# Patient Record
Sex: Female | Born: 1965 | Race: Black or African American | Hispanic: No | Marital: Single | Smoking: Current every day smoker
Health system: Southern US, Community
[De-identification: ages and names within clinical notes are randomized; demographics above are authoritative.]

## PROBLEM LIST (undated history)

## (undated) DIAGNOSIS — Z9289 Personal history of other medical treatment: Secondary | ICD-10-CM

## (undated) DIAGNOSIS — J189 Pneumonia, unspecified organism: Secondary | ICD-10-CM

## (undated) DIAGNOSIS — R002 Palpitations: Secondary | ICD-10-CM

## (undated) DIAGNOSIS — E049 Nontoxic goiter, unspecified: Secondary | ICD-10-CM

## (undated) DIAGNOSIS — Z720 Tobacco use: Secondary | ICD-10-CM

## (undated) DIAGNOSIS — S8990XA Unspecified injury of unspecified lower leg, initial encounter: Secondary | ICD-10-CM

## (undated) DIAGNOSIS — I10 Essential (primary) hypertension: Secondary | ICD-10-CM

## (undated) DIAGNOSIS — D649 Anemia, unspecified: Secondary | ICD-10-CM

## (undated) DIAGNOSIS — Z860100 Personal history of colon polyps, unspecified: Secondary | ICD-10-CM

## (undated) DIAGNOSIS — D219 Benign neoplasm of connective and other soft tissue, unspecified: Secondary | ICD-10-CM

## (undated) HISTORY — DX: Benign neoplasm of connective and other soft tissue, unspecified: D21.9

## (undated) HISTORY — PX: OTHER SURGICAL HISTORY: SHX169

## (undated) HISTORY — PX: JOINT REPLACEMENT: SHX530

## (undated) HISTORY — DX: Nontoxic goiter, unspecified: E04.9

## (undated) HISTORY — PX: COLONOSCOPY: SHX174

## (undated) HISTORY — DX: Morbid (severe) obesity due to excess calories: E66.01

## (undated) HISTORY — DX: Tobacco use: Z72.0

## (undated) HISTORY — PX: TUBAL LIGATION: SHX77

## (undated) HISTORY — DX: Anemia, unspecified: D64.9

## (undated) HISTORY — PX: HAND SURGERY: SHX662

---

## 1998-07-08 ENCOUNTER — Emergency Department (HOSPITAL_COMMUNITY): Admission: EM | Admit: 1998-07-08 | Discharge: 1998-07-08 | Payer: Self-pay | Admitting: Endocrinology

## 1999-12-03 ENCOUNTER — Emergency Department (HOSPITAL_COMMUNITY): Admission: EM | Admit: 1999-12-03 | Discharge: 1999-12-03 | Payer: Self-pay | Admitting: Emergency Medicine

## 1999-12-03 ENCOUNTER — Encounter: Payer: Self-pay | Admitting: Emergency Medicine

## 2000-04-20 ENCOUNTER — Other Ambulatory Visit: Admission: RE | Admit: 2000-04-20 | Discharge: 2000-04-20 | Payer: Self-pay | Admitting: Obstetrics

## 2001-08-26 ENCOUNTER — Encounter: Payer: Self-pay | Admitting: Obstetrics

## 2001-08-26 ENCOUNTER — Ambulatory Visit (HOSPITAL_COMMUNITY): Admission: RE | Admit: 2001-08-26 | Discharge: 2001-08-26 | Payer: Self-pay | Admitting: Obstetrics

## 2001-12-02 ENCOUNTER — Inpatient Hospital Stay (HOSPITAL_COMMUNITY): Admission: AD | Admit: 2001-12-02 | Discharge: 2001-12-02 | Payer: Self-pay | Admitting: Obstetrics

## 2002-04-22 ENCOUNTER — Emergency Department (HOSPITAL_COMMUNITY): Admission: EM | Admit: 2002-04-22 | Discharge: 2002-04-22 | Payer: Self-pay | Admitting: Emergency Medicine

## 2003-08-20 ENCOUNTER — Encounter: Payer: Self-pay | Admitting: Emergency Medicine

## 2003-08-20 ENCOUNTER — Emergency Department (HOSPITAL_COMMUNITY): Admission: EM | Admit: 2003-08-20 | Discharge: 2003-08-20 | Payer: Self-pay | Admitting: Emergency Medicine

## 2003-11-25 ENCOUNTER — Emergency Department (HOSPITAL_COMMUNITY): Admission: EM | Admit: 2003-11-25 | Discharge: 2003-11-26 | Payer: Self-pay

## 2003-12-08 ENCOUNTER — Ambulatory Visit (HOSPITAL_COMMUNITY): Admission: RE | Admit: 2003-12-08 | Discharge: 2003-12-08 | Payer: Self-pay | Admitting: Obstetrics

## 2004-11-21 ENCOUNTER — Emergency Department (HOSPITAL_COMMUNITY): Admission: EM | Admit: 2004-11-21 | Discharge: 2004-11-21 | Payer: Self-pay | Admitting: *Deleted

## 2005-02-14 ENCOUNTER — Encounter: Admission: RE | Admit: 2005-02-14 | Discharge: 2005-02-14 | Payer: Self-pay | Admitting: Obstetrics

## 2005-06-12 ENCOUNTER — Emergency Department (HOSPITAL_COMMUNITY): Admission: EM | Admit: 2005-06-12 | Discharge: 2005-06-12 | Payer: Self-pay | Admitting: Emergency Medicine

## 2005-06-23 ENCOUNTER — Emergency Department (HOSPITAL_COMMUNITY): Admission: EM | Admit: 2005-06-23 | Discharge: 2005-06-23 | Payer: Self-pay | Admitting: Emergency Medicine

## 2005-08-20 ENCOUNTER — Emergency Department (HOSPITAL_COMMUNITY): Admission: EM | Admit: 2005-08-20 | Discharge: 2005-08-20 | Payer: Self-pay | Admitting: Emergency Medicine

## 2006-01-20 ENCOUNTER — Emergency Department (HOSPITAL_COMMUNITY): Admission: EM | Admit: 2006-01-20 | Discharge: 2006-01-20 | Payer: Self-pay | Admitting: Emergency Medicine

## 2007-07-03 ENCOUNTER — Emergency Department (HOSPITAL_COMMUNITY): Admission: EM | Admit: 2007-07-03 | Discharge: 2007-07-03 | Payer: Self-pay | Admitting: Emergency Medicine

## 2007-09-08 ENCOUNTER — Inpatient Hospital Stay (HOSPITAL_COMMUNITY): Admission: AD | Admit: 2007-09-08 | Discharge: 2007-09-08 | Payer: Self-pay | Admitting: Gynecology

## 2007-09-09 ENCOUNTER — Encounter (INDEPENDENT_AMBULATORY_CARE_PROVIDER_SITE_OTHER): Payer: Self-pay | Admitting: Internal Medicine

## 2007-09-09 ENCOUNTER — Encounter: Admission: RE | Admit: 2007-09-09 | Discharge: 2007-09-09 | Payer: Self-pay | Admitting: Gynecology

## 2007-11-15 ENCOUNTER — Emergency Department (HOSPITAL_COMMUNITY): Admission: EM | Admit: 2007-11-15 | Discharge: 2007-11-15 | Payer: Self-pay | Admitting: Emergency Medicine

## 2007-12-23 HISTORY — PX: ANKLE SURGERY: SHX546

## 2008-02-27 ENCOUNTER — Inpatient Hospital Stay (HOSPITAL_COMMUNITY): Admission: EM | Admit: 2008-02-27 | Discharge: 2008-03-01 | Payer: Self-pay | Admitting: Emergency Medicine

## 2008-03-08 ENCOUNTER — Emergency Department (HOSPITAL_COMMUNITY): Admission: EM | Admit: 2008-03-08 | Discharge: 2008-03-08 | Payer: Self-pay | Admitting: Emergency Medicine

## 2009-02-13 ENCOUNTER — Inpatient Hospital Stay (HOSPITAL_COMMUNITY): Admission: AD | Admit: 2009-02-13 | Discharge: 2009-02-13 | Payer: Self-pay | Admitting: Obstetrics & Gynecology

## 2009-08-29 ENCOUNTER — Emergency Department (HOSPITAL_COMMUNITY): Admission: EM | Admit: 2009-08-29 | Discharge: 2009-08-29 | Payer: Self-pay | Admitting: Emergency Medicine

## 2009-08-31 ENCOUNTER — Emergency Department (HOSPITAL_COMMUNITY): Admission: EM | Admit: 2009-08-31 | Discharge: 2009-08-31 | Payer: Self-pay | Admitting: Emergency Medicine

## 2009-09-17 ENCOUNTER — Ambulatory Visit (HOSPITAL_COMMUNITY): Admission: RE | Admit: 2009-09-17 | Discharge: 2009-09-17 | Payer: Self-pay | Admitting: Internal Medicine

## 2009-09-17 ENCOUNTER — Encounter (INDEPENDENT_AMBULATORY_CARE_PROVIDER_SITE_OTHER): Payer: Self-pay | Admitting: Internal Medicine

## 2009-09-17 ENCOUNTER — Ambulatory Visit: Payer: Self-pay | Admitting: Infectious Diseases

## 2009-09-17 DIAGNOSIS — I10 Essential (primary) hypertension: Secondary | ICD-10-CM

## 2009-09-17 DIAGNOSIS — Z862 Personal history of diseases of the blood and blood-forming organs and certain disorders involving the immune mechanism: Secondary | ICD-10-CM | POA: Insufficient documentation

## 2009-09-17 DIAGNOSIS — E0520 Thyrotoxicosis with toxic multinodular goiter without thyrotoxic crisis or storm: Secondary | ICD-10-CM

## 2009-09-17 DIAGNOSIS — M79609 Pain in unspecified limb: Secondary | ICD-10-CM | POA: Insufficient documentation

## 2009-09-20 ENCOUNTER — Encounter (INDEPENDENT_AMBULATORY_CARE_PROVIDER_SITE_OTHER): Payer: Self-pay | Admitting: Internal Medicine

## 2009-09-20 ENCOUNTER — Ambulatory Visit: Payer: Self-pay | Admitting: Internal Medicine

## 2009-09-20 LAB — CONVERTED CEMR LAB
Albumin: 4 g/dL (ref 3.5–5.2)
Alkaline Phosphatase: 72 units/L (ref 39–117)
BUN: 10 mg/dL (ref 6–23)
CO2: 21 meq/L (ref 19–32)
Calcium: 9.2 mg/dL (ref 8.4–10.5)
Cholesterol: 143 mg/dL (ref 0–200)
Free T4: 1.55 ng/dL (ref 0.80–1.80)
Glucose, Bld: 78 mg/dL (ref 70–99)
HDL: 64 mg/dL (ref >39)
Hemoglobin: 10 g/dL — ABNORMAL LOW (ref 12.0–15.0)
MCHC: 30.7 g/dL (ref 30.0–36.0)
Potassium: 4.6 meq/L (ref 3.5–5.3)
RBC: 4.33 M/uL (ref 3.87–5.11)
Total Protein: 6.8 g/dL (ref 6.0–8.3)
Triglycerides: 44 mg/dL (ref <150)
WBC: 5.7 10*3/uL (ref 4.0–10.5)

## 2009-09-26 ENCOUNTER — Encounter (HOSPITAL_COMMUNITY): Admission: RE | Admit: 2009-09-26 | Discharge: 2009-11-23 | Payer: Self-pay | Admitting: Internal Medicine

## 2009-10-01 ENCOUNTER — Telehealth (INDEPENDENT_AMBULATORY_CARE_PROVIDER_SITE_OTHER): Payer: Self-pay | Admitting: Internal Medicine

## 2009-10-02 ENCOUNTER — Encounter (INDEPENDENT_AMBULATORY_CARE_PROVIDER_SITE_OTHER): Payer: Self-pay | Admitting: Internal Medicine

## 2009-10-02 ENCOUNTER — Ambulatory Visit: Payer: Self-pay | Admitting: Internal Medicine

## 2009-10-02 ENCOUNTER — Ambulatory Visit (HOSPITAL_COMMUNITY): Admission: RE | Admit: 2009-10-02 | Discharge: 2009-10-02 | Payer: Self-pay | Admitting: Internal Medicine

## 2009-10-02 LAB — CONVERTED CEMR LAB: TSH: 0.007 microintl units/mL — ABNORMAL LOW (ref 0.350–4.5)

## 2009-10-05 ENCOUNTER — Ambulatory Visit: Payer: Self-pay | Admitting: Internal Medicine

## 2009-10-09 ENCOUNTER — Encounter (INDEPENDENT_AMBULATORY_CARE_PROVIDER_SITE_OTHER): Payer: Self-pay | Admitting: Internal Medicine

## 2009-10-10 ENCOUNTER — Encounter (INDEPENDENT_AMBULATORY_CARE_PROVIDER_SITE_OTHER): Payer: Self-pay | Admitting: Internal Medicine

## 2009-10-17 ENCOUNTER — Ambulatory Visit: Payer: Self-pay | Admitting: Internal Medicine

## 2009-10-18 ENCOUNTER — Ambulatory Visit: Payer: Self-pay | Admitting: Internal Medicine

## 2009-10-19 LAB — CONVERTED CEMR LAB
Cholesterol: 113 mg/dL (ref 0–200)
Ferritin: 14 ng/mL (ref 10–291)
HDL: 54 mg/dL (ref >39)
Saturation Ratios: 44 % (ref 20–55)
Total CHOL/HDL Ratio: 2.1
Triglycerides: 36 mg/dL (ref <150)
Vitamin B-12: 200 pg/mL — ABNORMAL LOW (ref 211–911)

## 2009-10-22 ENCOUNTER — Encounter (INDEPENDENT_AMBULATORY_CARE_PROVIDER_SITE_OTHER): Payer: Self-pay | Admitting: Internal Medicine

## 2009-10-29 ENCOUNTER — Ambulatory Visit: Payer: Self-pay | Admitting: Internal Medicine

## 2009-10-29 DIAGNOSIS — D518 Other vitamin B12 deficiency anemias: Secondary | ICD-10-CM

## 2009-11-01 ENCOUNTER — Ambulatory Visit: Payer: Self-pay | Admitting: Internal Medicine

## 2009-11-12 ENCOUNTER — Encounter (INDEPENDENT_AMBULATORY_CARE_PROVIDER_SITE_OTHER): Payer: Self-pay | Admitting: Internal Medicine

## 2009-12-28 ENCOUNTER — Telehealth: Payer: Self-pay | Admitting: Internal Medicine

## 2010-01-30 ENCOUNTER — Encounter: Payer: Self-pay | Admitting: Internal Medicine

## 2010-01-30 ENCOUNTER — Ambulatory Visit: Payer: Self-pay | Admitting: Internal Medicine

## 2010-01-30 DIAGNOSIS — J029 Acute pharyngitis, unspecified: Secondary | ICD-10-CM | POA: Insufficient documentation

## 2010-02-01 LAB — CONVERTED CEMR LAB
Basophils Relative: 1 % (ref 0–1)
Eosinophils Absolute: 0.3 10*3/uL (ref 0.0–0.7)
Free T4: 0.96 ng/dL (ref 0.80–1.80)
Lymphs Abs: 1.9 10*3/uL (ref 0.7–4.0)
MCV: 84.4 fL (ref >=78.0)
Monocytes Relative: 7 % (ref 3–12)
Neutro Abs: 2.7 10*3/uL (ref 1.7–7.7)
Neutrophils Relative %: 51 % (ref 43–77)
Platelets: 188 10*3/uL (ref 150–400)
RBC: 3.93 M/uL (ref 3.87–5.11)
Streptococcus, Group A Screen (Direct): NEGATIVE
WBC: 5.3 10*3/uL (ref 4.0–10.5)

## 2010-03-05 ENCOUNTER — Telehealth: Payer: Self-pay

## 2010-03-19 ENCOUNTER — Ambulatory Visit: Payer: Self-pay | Admitting: Infectious Diseases

## 2010-03-19 ENCOUNTER — Ambulatory Visit (HOSPITAL_COMMUNITY): Admission: RE | Admit: 2010-03-19 | Discharge: 2010-03-19 | Payer: Self-pay | Admitting: Infectious Diseases

## 2010-03-19 ENCOUNTER — Encounter (INDEPENDENT_AMBULATORY_CARE_PROVIDER_SITE_OTHER): Payer: Self-pay | Admitting: Internal Medicine

## 2010-03-20 LAB — CONVERTED CEMR LAB
Alkaline Phosphatase: 85 units/L (ref 39–117)
CO2: 22 meq/L (ref 19–32)
Creatinine, Ser: 0.95 mg/dL (ref 0.40–1.20)
Free T4: 0.53 ng/dL — ABNORMAL LOW (ref 0.80–1.80)
Glucose, Bld: 88 mg/dL (ref 70–99)
Sodium: 138 meq/L (ref 135–145)
TSH: 2.577 microintl units/mL (ref 0.350–4.5)
Total Bilirubin: 0.6 mg/dL (ref 0.3–1.2)
Total Protein: 7.6 g/dL (ref 6.0–8.3)

## 2010-05-08 ENCOUNTER — Ambulatory Visit: Payer: Self-pay | Admitting: Infectious Disease

## 2010-05-08 ENCOUNTER — Emergency Department (HOSPITAL_COMMUNITY): Admission: EM | Admit: 2010-05-08 | Discharge: 2010-05-08 | Payer: Self-pay | Admitting: Emergency Medicine

## 2010-05-08 ENCOUNTER — Telehealth: Payer: Self-pay | Admitting: *Deleted

## 2010-06-13 ENCOUNTER — Encounter: Payer: Self-pay | Admitting: Internal Medicine

## 2010-06-19 ENCOUNTER — Emergency Department (HOSPITAL_COMMUNITY): Admission: EM | Admit: 2010-06-19 | Discharge: 2010-06-19 | Payer: Self-pay | Admitting: Emergency Medicine

## 2010-06-25 ENCOUNTER — Encounter: Admission: RE | Admit: 2010-06-25 | Discharge: 2010-06-25 | Payer: Self-pay | Admitting: Otolaryngology

## 2010-06-25 ENCOUNTER — Other Ambulatory Visit: Admission: RE | Admit: 2010-06-25 | Discharge: 2010-06-25 | Payer: Self-pay | Admitting: Interventional Radiology

## 2010-06-30 ENCOUNTER — Ambulatory Visit: Payer: Self-pay | Admitting: Nurse Practitioner

## 2010-06-30 ENCOUNTER — Inpatient Hospital Stay (HOSPITAL_COMMUNITY): Admission: AD | Admit: 2010-06-30 | Discharge: 2010-06-30 | Payer: Self-pay | Admitting: Obstetrics

## 2010-09-20 ENCOUNTER — Ambulatory Visit: Payer: Self-pay | Admitting: Internal Medicine

## 2010-09-20 LAB — CONVERTED CEMR LAB
Free T4: 1.02 ng/dL (ref 0.80–1.80)
Hemoglobin: 11.4 g/dL — ABNORMAL LOW (ref 12.0–15.0)
RBC: 4.02 M/uL (ref 3.87–5.11)
TSH: 0.791 microintl units/mL (ref 0.350–4.5)
Vitamin B-12: 296 pg/mL (ref 211–911)
WBC: 6.7 10*3/uL (ref 4.0–10.5)

## 2010-09-21 ENCOUNTER — Telehealth: Payer: Self-pay | Admitting: Internal Medicine

## 2010-11-01 ENCOUNTER — Encounter: Admission: RE | Admit: 2010-11-01 | Discharge: 2010-11-01 | Payer: Self-pay | Admitting: Obstetrics

## 2010-11-25 ENCOUNTER — Ambulatory Visit: Payer: Self-pay

## 2010-12-17 ENCOUNTER — Emergency Department (HOSPITAL_COMMUNITY)
Admission: EM | Admit: 2010-12-17 | Discharge: 2010-12-17 | Payer: Self-pay | Source: Home / Self Care | Admitting: Emergency Medicine

## 2010-12-22 HISTORY — PX: REPLACEMENT TOTAL KNEE: SUR1224

## 2011-01-03 ENCOUNTER — Emergency Department (HOSPITAL_COMMUNITY)
Admission: EM | Admit: 2011-01-03 | Discharge: 2011-01-04 | Payer: Self-pay | Source: Home / Self Care | Admitting: Emergency Medicine

## 2011-01-12 ENCOUNTER — Encounter: Payer: Self-pay | Admitting: Obstetrics

## 2011-01-13 ENCOUNTER — Ambulatory Visit: Admit: 2011-01-13 | Payer: Self-pay

## 2011-01-13 ENCOUNTER — Emergency Department (HOSPITAL_COMMUNITY)
Admission: EM | Admit: 2011-01-13 | Discharge: 2011-01-13 | Payer: Self-pay | Source: Home / Self Care | Admitting: Emergency Medicine

## 2011-01-21 NOTE — Assessment & Plan Note (Signed)
Summary: PAIN BACK/ (RIOFRIO)SB.   Allergies: No Known Drug Allergies   Complete Medication List: 1)  Atenolol 50 Mg Tabs (Atenolol) .... Take 1 pill by mouth daily. 2)  Tylenol With Codeine #3 300-30 Mg Tabs (Acetaminophen-codeine) .... Take 1 tablet every 6 hours as needed for pain. 3)  Hydrochlorothiazide 25 Mg Tabs (Hydrochlorothiazide) .... Take 1 tablet by mouth daily Patient presented to the front desk and was taken to the ED given her complaints. Please see the phone note from today.

## 2011-01-21 NOTE — Progress Notes (Signed)
  Phone Note Call from Patient   Reason for Call: Referral Summary of Call: Patient called this morning and cancelled her appointment for this afternoon with Dr. Tobie Lords.  Patient is having transportation issues.  Ms. Bossler stated that she has now found a closer Endo/Dr.  Here is the information she gave,  she said she called Dr. Laurena Slimmer office on [address] at [phone].  She said this office will accept her Insurance which is Medicaid. Initial call taken by: Shon Hough,  March 05, 2010 1:58 PM

## 2011-01-21 NOTE — Progress Notes (Signed)
Summary: chest pain/ hla  Phone Note Other Incoming   Summary of Call: pt presents crying, c/o L shoulder pain x 2 wks and since last pm radiating to L chest, and c/o difficulty breathing and shortness of breath, wh/ch is obtained, pt assisted to wh/ch, chart summary printed and taken to ED, report given to [facility]  triage nurse Initial call taken by: Marin Roberts RN,  May 09, 2010 6:32 PM

## 2011-01-21 NOTE — Progress Notes (Signed)
  Phone Note Call from Patient   Caller: Patient Reason for Call: Refill Medication Summary of Call: Pt seen in clinic yesterday; given scripts for benadryl and topical anti-itch cream for symptomatic relief of chicken pox.  Pt reports scripts not received by her [facility] pharmacy and she request refills.  Refills sent to [facility] on [address] per pt request. Initial call taken by: Nelda Bucks DO,  September 21, 2010 9:46 AM

## 2011-01-21 NOTE — Assessment & Plan Note (Signed)
Summary: throat pain/gg   Vital Signs:  Patient profile:   45 year old female Height:      67 inches (170.18 cm) Weight:      257.2 pounds (116.91 kg) BMI:     40.43 Temp:     98.4 degrees F oral Pulse rate:   65 / minute BP sitting:   147 / 79  (right arm) Cuff size:   large  Vitals Entered By: Chinita Pester RN (January 30, 2010 2:44 PM) CC: Difficulty swallowing w/pain x 2 weeks. Also burning sensation. Is Patient Diabetic? No Pain Assessment Patient in pain? yes     Location: throat Intensity: 8 Type: soreness Onset of pain  Intermittent Nutritional Status BMI of > 30 = obese  Have you ever been in a relationship where you felt threatened, hurt or afraid?No   Does patient need assistance? Functional Status Self care Ambulation Normal   CC:  Difficulty swallowing w/pain x 2 weeks. Also burning sensation.Marland Kitchen  History of Present Illness: [name] is a 45 yo F with PMH of toxic multinodular goiter and newly diagnosed B12 deficiency who presents for:  1)C/o sore throat with swollowing, talking and eating.  Onset 1 week ago.  Pt has been seen and treated at [facility] for goiter per endocrine referral.  She states she was given  a radiation "pill" to swallow X 1 .  This sore throat started 1 - 2 weeks after treatment.  Not associated with fevers. Has a burning sensation in back of throat. She has f/u at [facility] on 2/17 and has not been able to get in touch with them.   Hx of multinodular goiter - dx in 09/2009, patient followed by endocrine at [facility]. Pt elected for subtotal thyroidectomy, however surgery was not recommended at that time because her thyroid gland was significantly swollen. Pt receive RAI in 12/2009. No further f/u since treatment.   Preventive Screening-Counseling & Management  Alcohol-Tobacco     Alcohol type: beer     Smoking Status: current     Smoking Cessation Counseling: yes     Packs/Day: 2 cigs this week-end     Year Started: at age  74  Caffeine-Diet-Exercise     Caffeine use/day: 1 cup coffee/day     Does Patient Exercise: no  Current Medications (verified): 1)  Atenolol 50 Mg Tabs (Atenolol) .... Take 1 Pill By Mouth Daily.  Allergies (verified): No Known Drug Allergies  Past History:  Past Medical History: Hx of Multinodular goiter dx 11/2009, treated at [facility] (Dr. Esaw Grandchild and Dr. Normajean Glasgow)  Social History: Does Patient Exercise:  no  Review of Systems ENT:  Complains of hoarseness and sore throat; denies ear discharge, earache, nasal congestion, and sinus pressure. Resp:  Complains of cough; denies chest pain with inspiration, pleuritic, shortness of breath, and sputum productive. GI:  Denies abdominal pain, change in bowel habits, nausea, and vomiting.  Physical Exam  General:  alert and well-developed.   Head:  normocephalic and atraumatic.   Eyes:  vision grossly intact, pupils equal, pupils round, and pupils reactive to light.   Ears:  R ear normal and L ear normal.   Nose:  no nasal discharge, no mucosal pallor, and no mucosal edema.   Mouth:  good dentition, pharynx pink and moist, no erythema, no exudates, and no posterior lymphoid hypertrophy.   Neck:  supple, full ROM, no masses, and no thyromegaly.   Lungs:  normal respiratory effort, normal breath sounds,  no dullness, no fremitus, no crackles, and no wheezes.   Heart:  normal rate, regular rhythm, no murmur, no gallop, and no rub.   Abdomen:  soft, non-tender, normal bowel sounds, no distention, no masses, and no guarding.     Impression & Recommendations:  Problem # 1:  SORE THROAT (ICD-462) Assessment New Patient's sore throat is unliely infectious given no hx of fevers, chills, flu-like symptoms as well as description of burning, and burning sensation at back of throat. Rapid strep performed and was negative. After speaking with patient's endocrinologist at [facility] (Dr. Royston Sinner) it is very unlikely that RAI is the culprit  for sore throat. In speaking with her, as well as the attending physician at [facility], RAI induced thyroiditis is not likely, however possible. Their recommendations included checking TSH, and free T4, and will have patient follow up next week at endocrine clinic. Pt will have thyroid ultrasound at follow-up appointment. I will give a short course of tyelenol-codeine for pain management as pain has not been well managed with ibuprofen.   Orders: T-Culture, Rapid Strep ([id number])  Problem # 2:  GOITER, TOXIC, MULTINODULAR (ICD-242.20) Assessment: Comment Only Please refer to above discussion.   Her updated medication list for this problem includes:    Atenolol 50 Mg Tabs (Atenolol) .Marland Kitchen... Take 1 pill by mouth daily.  Orders: T-CBC w/Diff [phone]) T-T4, Free [phone]) T-TSH [phone])  Problem # 3:  ANEMIA, VITAMIN B12 DEFICIENCY (ICD-281.1) Assessment: Unchanged Pt reports discontinuing iron supplements due to headaches. Advised patient that given her anemia, it is important for her to continue with Iron supplements.  Plan: -check CBC today  The following medications were removed from the medication list:    Iron 325 (65 Fe) Mg Tabs (Ferrous sulfate) .Marland Kitchen... Take 1 pill by mouth once daily  Problem # 4:  HYPERTENSION (ICD-401.9) Assessment: Unchanged Relatively unchanged. Will start patient on 2nd bp agent as SBP may be elevated 2/2 to pain. Will recheck at next clinic visit and will consider anti-hypertensive if continues to be elevated.   Her updated medication list for this problem includes:    Atenolol 50 Mg Tabs (Atenolol) .Marland Kitchen... Take 1 pill by mouth daily.  BP today: 147/79 Prior BP: 145/87 (10/29/2009)  Labs Reviewed: K+: 4.6 (09/17/2009) Creat: : 0.60 (09/17/2009)   Chol: 113 (10/18/2009)   HDL: 54 (10/18/2009)   LDL: 52 (10/18/2009)   TG: 36 (10/18/2009)  Complete Medication List: 1)  Atenolol 50 Mg Tabs (Atenolol) .... Take 1 pill by mouth daily. 2)   Tylenol With Codeine #3 300-30 Mg Tabs (Acetaminophen-codeine) .... Take 1 tablet every 6 hours as needed for pain.  Patient Instructions: 1)  Please schedule a follow-up appointment in 2 weeks. 2)  Please follow up with endocrinologist as scheduled.  Prescriptions: TYLENOL WITH CODEINE #3 300-30 MG TABS (ACETAMINOPHEN-CODEINE) Take 1 tablet every 6 hours as needed for pain.  #30 x 0   Entered and Authorized by:   Melida Quitter MD   Signed by:   Melida Quitter MD on 01/30/2010   Method used:   Print then Give to Patient   RxID:   [id number]   Prevention & Chronic Care Immunizations   Influenza vaccine: Not documented   Influenza vaccine deferral: Refused  (10/17/2009)    Tetanus booster: Not documented    Pneumococcal vaccine: Not documented  Other Screening   Pap smear: Not documented    Mammogram: Not documented   Mammogram action/deferral: Ordered  (09/17/2009)   Smoking status: current  (  01/30/2010)   Smoking cessation counseling: yes  (01/30/2010)  Lipids   Total Cholesterol: 113  (10/18/2009)   LDL: 52  (10/18/2009)   LDL Direct: Not documented   HDL: 54  (10/18/2009)   Triglycerides: 36  (10/18/2009)  Hypertension   Last Blood Pressure: 147 / 79  (01/30/2010)   Serum creatinine: 0.60  (09/17/2009)   Serum potassium 4.6  (09/17/2009)    Hypertension flowsheet reviewed?: Yes   Progress toward BP goal: Unchanged  Self-Management Support :   Personal Goals (by the next clinic visit) :      Personal blood pressure goal: 140/90  (01/30/2010)   Patient will work on the following items until the next clinic visit to reach self-care goals:     Medications and monitoring: take my medicines every day, check my blood pressure  (01/30/2010)    Hypertension self-management support: BP self-monitoring log, Written self-care plan  (01/30/2010)   Hypertension self-care plan printed.  Process Orders Check Orders Results:     [facility]: ABN not  required for this insurance Tests Sent for requisitioning (January 30, 2010 9:02 PM):     01/30/2010: [facility] -- T-Culture, Rapid Strep [[id number]] (signed)     01/30/2010: [facility] -- T-CBC w/Diff [[id number]] (signed)     01/30/2010: [facility] -- [place], [place] [[id number]] (signed)     01/30/2010: [facility] -- T-TSH [phone] (signed)    Process Orders Check Orders Results:     [facility]: ABN not required for this insurance Tests Sent for requisitioning (January 30, 2010 9:02 PM):     01/30/2010: [facility] -- T-Culture, Rapid Strep [[id number]] (signed)     01/30/2010: [facility] -- T-CBC w/Diff [[id number]] (signed)     01/30/2010: [facility] -- [place], [place] [[id number]] (signed)     01/30/2010: [facility] -- T-TSH [phone] (signed)

## 2011-01-21 NOTE — Assessment & Plan Note (Signed)
Summary: acute-recheck bp/cfb(pokharel)   Vital Signs:  Patient profile:   45 year old female Height:      67 inches (170.18 cm) Weight:      266.8 pounds (121.27 kg) BMI:     41.94 Temp:     97.3 degrees F (36.28 degrees C) oral Pulse rate:   80 / minute BP sitting:   161 / 107  (left arm)  Vitals Entered By: Blenda Mounts (March 19, 2010 1:56 PM)/Gladys Herbin, RN March 19, 2010 1:56 AM CC: bp recheck, tight feeling on left side of check through arm and back , light headed, hand swelling Is Patient Diabetic? No Pain Assessment Patient in pain? no      Type: tingling Nutritional Status BMI of > 30 = obese  Does patient need assistance? Functional Status Self care Ambulation Normal Comments pt thinks medication may be causing swelling in hands, rechecked bp on RA sitting was 150/105, pt also need a referral to an endocrinologist in [place], did make the appt with endocrinologist in [place] but because of transportation issue need one in [place]   CC:  bp recheck, tight feeling on left side of check through arm and back , light headed, and hand swelling.  History of Present Illness: Ms. Domzalski is a 45 yo F with PMH of hyperthyroidism s/p RAI in 1/11 who presents for chest/arm tingling and pain. She says over the past few days, she has had episodes of chest/arm tingling that progress into a "squeezing" pain, like "a blood pressure cuff is squeezing my arm." During these episodes, which last approx 1 minute and predominantly affect her R arm, her arm feels so heavy and painful that she can't lift her arm up. They can occur at any time, even when she's just sitting and making conversation, and are associated with dizziness with room spinning and a lightheaded sensation. She felt like her blood pressure has been high and feels like this may be 2/2 her thyroid. Denies SOB or nausea during these episodes. She also reports occasional night sweats over the past few days as  well continued sore throat 2/2 thyroid (see clinic note 2/9) and hand/feet swelling that she feels is related to her atenolol (as she has c/o this since starting the atenolol several months ago).   Depression History:      The patient denies a depressed mood most of the day and a diminished interest in her usual daily activities.         Preventive Screening-Counseling & Management  Alcohol-Tobacco     Alcohol drinks/day: 0     Smoking Status: current     Smoking Cessation Counseling: yes     Packs/Day: 2 cigs this week-end     Year Started: at age 40  Caffeine-Diet-Exercise     Caffeine use/day: soda/ tea     Does Patient Exercise: no     Type of exercise: walking     Times/week: 5  Current Medications (verified): 1)  Atenolol 50 Mg Tabs (Atenolol) .... Take 1 Pill By Mouth Daily. 2)  Tylenol With Codeine #3 300-30 Mg Tabs (Acetaminophen-Codeine) .... Take 1 Tablet Every 6 Hours As Needed For Pain. 3)  Hydrochlorothiazide 25 Mg Tabs (Hydrochlorothiazide) .... Take 1 Tablet By Mouth Daily  Allergies (verified): No Known Drug Allergies  Past History:  Past Medical History: Last updated: 01/30/2010 Hx of Multinodular goiter dx 11/2009, treated at [facility] (Dr. Esaw Grandchild and Dr. Normajean Glasgow)  Past Surgical History: Last updated: 09/17/2009  Surgery for R broken ankle in 2009  Family History: Last updated: 09/17/2009 Father-HTN Mother- HTN Grandmother- lung ca  Social History: Last updated: 09/17/2009 lives at home with 82 yo son applying for disability, previously worked in restaurants tobacco- 1/2 PPD x 20 years alcohol- drinks once/month drugs- none  Risk Factors: Alcohol Use: 0 (03/19/2010) Caffeine Use: soda/ tea (03/19/2010) Exercise: no (03/19/2010)  Risk Factors: Smoking Status: current (03/19/2010) Packs/Day: 2 cigs this week-end (03/19/2010)  Social History: Caffeine use/day:  soda/ tea  Review of Systems      See HPI  Physical  Exam  General:  Well-developed,well-nourished,in no acute distress; alert,appropriate and cooperative throughout examination Head:  Normocephalic and atraumatic without obvious abnormalities. No apparent alopecia or balding. Neck:  mildly grossly enlarged thyroid, could not palpate specific nodules Lungs:  Normal respiratory effort, chest expands symmetrically. Lungs are clear to auscultation, no crackles or wheezes. Heart:  Normal rate and regular rhythm. S1 and S2 normal without gallop, murmur, click, rub or other extra sounds. Extremities:  trace edema in feet and hands bilaterally Neurologic:  alert & oriented X3.   Psych:  Cognition and judgment appear intact. Alert and cooperative with normal attention span and concentration. No apparent delusions, illusions, hallucinations   Impression & Recommendations:  Problem # 1:  ARM PAIN (ICD-729.5) EKG was ordered, which ruled out ischemia as cause of her arm/chest/dizziness complaints. Her symptoms are likely 2/2 to hyperthyroidism that has not been adequately treated and/or uncontrolled hypertension (which is likely 2/2 the former). TIA is also a remote possibility but far less likely considering the frequency of identical episodes. We will try to control her BP (see hypertension assessment) and remeasure TSH. We will also remeasure B12 as she had low B12 with normal MMA in 10/10; B12 shots were initially ordered but were held after the MMA returned normal (I had spoken with an attending about this matter at the time). Given her symptoms, we will remeasure B12 and give her a shot today, per Dr. Jarrett Ables recommendations. Depending on what her B12 level is today, she may need to start oral therapy. She will contact the clinic if her symptoms do not improve.  EKG showed normal sinus rhythm with LVH and possible atrial enlargement. No signs of ischemia.  Orders: 12 Lead EKG (12 Lead EKG)  Problem # 2:  HYPERTENSION (ICD-401.9) Worse today and  likely contributing to her current symptoms. Given her HTN and her swelling, I will start her on HCTZ. She will get a Bmet today and a repeat Bmet in 2 weeks as well as a repeat BP check.  Her updated medication list for this problem includes:    Atenolol 50 Mg Tabs (Atenolol) .Marland Kitchen... Take 1 pill by mouth daily.    Hydrochlorothiazide 25 Mg Tabs (Hydrochlorothiazide) .Marland Kitchen... Take 1 tablet by mouth daily  Problem # 3:  GOITER, TOXIC, MULTINODULAR (ICD-242.20) She had RAI in Jan '11 (she was supposed to have a partial thyroidectomy but her thyroid was too swollen at the time per clinic note by Dr. Baltazar Apo on 01/30/10) but is still hyperthyroid with suppressed TSH (but normal free T4) per labs done 01/30/10. I will recheck TSH and free T4 today given her worsening symptoms. She missed her endo appt on 3/15 due to transportation difficulties, so she is scheduled to see them [facility]) on 4/19. However, she has recently been referred to a local endocrinology clinic in [place] now that she has medicaid, so hopefully they can see her sooner and  decide on a treatment plan for her.  Her updated medication list for this problem includes:    Atenolol 50 Mg Tabs (Atenolol) .Marland Kitchen... Take 1 pill by mouth daily.  Orders: T-Comprehensive Metabolic Panel [phone]) T-TSH [phone]) T-T4, Free [phone])  Problem # 4:  ANEMIA, VITAMIN B12 DEFICIENCY (ICD-281.1) See assessment under #1. Will check B12 today and also give a B12 shot per Dr. Jarrett Ables recommendations. She may or may not need oral therapy depending on the results of the lab test.  Orders: Vit B12 1000 mcg (J3420) T-Vitamin B12 ([id number]) Admin of Therapeutic Inj  intramuscular or subcutaneous ([id number])  Complete Medication List: 1)  Atenolol 50 Mg Tabs (Atenolol) .... Take 1 pill by mouth daily. 2)  Tylenol With Codeine #3 300-30 Mg Tabs (Acetaminophen-codeine) .... Take 1 tablet every 6 hours as needed for pain. 3)   Hydrochlorothiazide 25 Mg Tabs (Hydrochlorothiazide) .... Take 1 tablet by mouth daily  Patient Instructions: 1)  Please schedule a follow-up appointment in 2 weeks. 2)  I have added a medication for you called hydrochlorothiazide. This should help with both your blood pressure and your swelling.  3)  The endocrinology clinic in [place] will call you with an appointment. 4)  If your chest pain and other symptoms do not improve, please let us know.  Prescriptions: TYLENOL WITH CODEINE #3 300-30 MG TABS (ACETAMINOPHEN-CODEINE) Take 1 tablet every 6 hours as needed for pain.  #60 x 0   Entered and Authorized by:   Silvestre Gunner MD   Signed by:   Silvestre Gunner MD on 03/19/2010   Method used:   Handwritten   RxID:   [id number] HYDROCHLOROTHIAZIDE 25 MG TABS (HYDROCHLOROTHIAZIDE) Take 1 tablet by mouth daily  #30 x 6   Entered and Authorized by:   Silvestre Gunner MD   Signed by:   Silvestre Gunner MD on 03/19/2010   Method used:   Print then Give to Patient   RxID:   [id number]   Prevention & Chronic Care Immunizations   Influenza vaccine: Not documented   Influenza vaccine deferral: Refused  (10/17/2009)    Tetanus booster: Not documented    Pneumococcal vaccine: Not documented  Other Screening   Pap smear: Not documented    Mammogram: Not documented   Mammogram action/deferral: Ordered  (09/17/2009)   Smoking status: current  (03/19/2010)   Smoking cessation counseling: yes  (03/19/2010)  Lipids   Total Cholesterol: 113  (10/18/2009)   LDL: 52  (10/18/2009)   LDL Direct: Not documented   HDL: 54  (10/18/2009)   Triglycerides: 36  (10/18/2009)  Hypertension   Last Blood Pressure: 161 / 107  (03/19/2010)   Serum creatinine: 0.60  (09/17/2009)   Serum potassium 4.6  (09/17/2009) CMP ordered   Self-Management Support :   Personal Goals (by the next clinic visit) :      Personal blood pressure goal: 140/90  (01/30/2010)   Patient will work on the following  items until the next clinic visit to reach self-care goals:     Medications and monitoring: take my medicines every day, check my blood pressure  (03/19/2010)     Eating: eat more vegetables, eat foods that are low in salt, eat baked foods instead of fried foods, eat fruit for snacks and desserts  (03/19/2010)    Hypertension self-management support: Written self-care plan  (03/19/2010)   Hypertension self-care plan printed.  Process Orders Check Orders Results:     [facility]: ABN not required for this insurance  Tests Sent for requisitioning (March 19, 2010 4:17 PM):     03/19/2010: [facility] -- T-Comprehensive Metabolic Panel [[id number]] (signed)     03/19/2010: [facility] -- T-TSH [phone] (signed)     03/19/2010: [facility] -- [place], [place] [[id number]] (signed)     03/19/2010: [facility] -- T-Vitamin B12 [phone] (signed)    Process Orders Check Orders Results:     [facility]: ABN not required for this insurance Tests Sent for requisitioning (March 19, 2010 4:17 PM):     03/19/2010: [facility] -- T-Comprehensive Metabolic Panel [[id number]] (signed)     03/19/2010: [facility] -- T-TSH [phone] (signed)     03/19/2010: [facility] -- T-T4, [place] [[id number]] (signed)     03/19/2010: [facility] -- T-Vitamin B12 [phone] (signed)    Medication Administration  Injection # 1:    Medication: Vit B12 1000 mcg    Diagnosis: ANEMIA, VITAMIN B12 DEFICIENCY (ICD-281.1)    Route: IM    Site: L deltoid    Exp Date: 11/2011    Lot #: [id number]    Mfr: [employer]    Patient tolerated injection without complications    Given by: Blenda Mounts (March 19, 2010 3:56 PM)/Gladys Marylyn Ishihara, RN March 19, 2010 3:56 PM  Orders Added: 1)  T-Comprehensive Metabolic Panel [[id number]] 2)  T-TSH  [[id number]] 3)  T-T4, Free [[id number]] 4)  Vit B12 1000 mcg [J3420] 5)  Est. Patient Level IV [76283] 6)  T-Vitamin B12 [[id number]] 7)  Admin of Therapeutic Inj  intramuscular or subcutaneous [[id number]] 8)  12 Lead EKG [12 Lead EKG]

## 2011-01-21 NOTE — Consult Note (Signed)
Summary: [name]/THYROID  [name]/THYROID   Imported By: Margie Billet 09/26/2010 13:39:30  _____________________________________________________________________  External Attachment:    Type:   Image     Comment:   External Document

## 2011-01-21 NOTE — Letter (Signed)
Summary: [facility]Y [facility]   [facility]OCRINOLOGY [facility]   Imported By: Margie Billet 04/18/2010 10:28:57  _____________________________________________________________________  External Attachment:    Type:   Image     Comment:   External Document  Appended Document: [facility]INOLOGY [facility]  Thyroid studies normal. Pt told to continue atenolol but she does not need any other meds at this time.

## 2011-01-21 NOTE — Progress Notes (Signed)
Summary: Refill/gh  Phone Note Refill Request Message from:  Patient on December 28, 2009 9:35 AM  Refills Requested: Medication #1:  ATENOLOL 50 MG TABS take 1 pill by mouth daily.   Dosage confirmed as above?Dosage Confirmed   Supply Requested: 1 year   Last Refilled: 11/21/2009  Method Requested: Electronic Initial call taken by: Angelina Ok RN,  December 28, 2009 9:35 AM    Prescriptions: ATENOLOL 50 MG TABS (ATENOLOL) take 1 pill by mouth daily.  #30 x 11   Entered and Authorized by:   Doneen Poisson MD   Signed by:   Doneen Poisson MD on 12/28/2009   Method used:   Electronically to        [facility] [phone]* (retail)       [address]       [place], [place]  [address]       Ph: [phone]       Fax: [phone]   RxID:   [id number]
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
## 2011-01-21 NOTE — Assessment & Plan Note (Signed)
Summary: Walk-in  Pt walked into clinic and wants to be seen.  c/o sore throat with swollowing, talking and eating.  Onset 1 week ago.  Pt has been seen and treated at [facility] for goiter per endocrine referral.  She states she was given  a radiation "pill" to swollow X 1 .  THis sore throat started 1 - 2 weeks after treatment.  SHe has f/u at [facility] on 2/17 and has not been able to get in touch with them.    Pt is not in any acute distress. Due to the late hour we will see pt this afternoon.  Agree with PM appoitment.

## 2011-03-03 LAB — URINALYSIS, ROUTINE W REFLEX MICROSCOPIC
Bilirubin Urine: NEGATIVE
Nitrite: NEGATIVE
Specific Gravity, Urine: 1.026 (ref 1.005–1.030)
Urobilinogen, UA: 0.2 mg/dL (ref 0.0–1.0)
pH: 6 (ref 5.0–8.0)

## 2011-03-03 LAB — DIFFERENTIAL
Basophils Absolute: 0 10*3/uL (ref 0.0–0.1)
Basophils Relative: 1 % (ref 0–1)
Eosinophils Relative: 4 % (ref 0–5)
Monocytes Absolute: 0.4 10*3/uL (ref 0.1–1.0)
Monocytes Relative: 7 % (ref 3–12)

## 2011-03-03 LAB — POCT I-STAT, CHEM 8
Chloride: 105 mEq/L (ref 96–112)
HCT: 36 % (ref 36.0–46.0)
Hemoglobin: 12.2 g/dL (ref 12.0–15.0)
Potassium: 4.1 mEq/L (ref 3.5–5.1)
Sodium: 138 mEq/L (ref 135–145)

## 2011-03-03 LAB — CBC
Hemoglobin: 11.4 g/dL — ABNORMAL LOW (ref 12.0–15.0)
MCV: 87.7 fL (ref 78.0–100.0)
Platelets: 291 10*3/uL (ref 150–400)
RBC: 3.98 MIL/uL (ref 3.87–5.11)
WBC: 6.2 10*3/uL (ref 4.0–10.5)

## 2011-03-03 LAB — WET PREP, GENITAL: Yeast Wet Prep HPF POC: NONE SEEN

## 2011-03-03 LAB — GC/CHLAMYDIA PROBE AMP, GENITAL: GC Probe Amp, Genital: NEGATIVE

## 2011-03-09 LAB — URINALYSIS, ROUTINE W REFLEX MICROSCOPIC
Bilirubin Urine: NEGATIVE
Glucose, UA: NEGATIVE mg/dL
Ketones, ur: NEGATIVE mg/dL
Ketones, ur: NEGATIVE mg/dL
Nitrite: NEGATIVE
Nitrite: NEGATIVE
Protein, ur: NEGATIVE mg/dL
Urobilinogen, UA: 0.2 mg/dL (ref 0.0–1.0)
Urobilinogen, UA: 1 mg/dL (ref 0.0–1.0)
pH: 6.5 (ref 5.0–8.0)

## 2011-03-09 LAB — WET PREP, GENITAL
Trich, Wet Prep: NONE SEEN
Yeast Wet Prep HPF POC: NONE SEEN

## 2011-03-09 LAB — BASIC METABOLIC PANEL
BUN: 9 mg/dL (ref 6–23)
Calcium: 8.8 mg/dL (ref 8.4–10.5)
Creatinine, Ser: 0.81 mg/dL (ref 0.4–1.2)
GFR calc non Af Amer: >60 mL/min (ref >60)
Glucose, Bld: 85 mg/dL (ref 70–99)

## 2011-03-09 LAB — URINE CULTURE: Colony Count: >=100000

## 2011-03-09 LAB — URINE MICROSCOPIC-ADD ON

## 2011-03-09 LAB — BRAIN NATRIURETIC PEPTIDE: Pro B Natriuretic peptide (BNP): 71 pg/mL (ref 0.0–100.0)

## 2011-03-09 LAB — D-DIMER, QUANTITATIVE: D-Dimer, Quant: >20 ug/mL-FEU — ABNORMAL HIGH (ref 0.00–0.48)

## 2011-03-10 LAB — COMPREHENSIVE METABOLIC PANEL
AST: 23 U/L (ref 0–37)
Albumin: 3.8 g/dL (ref 3.5–5.2)
Alkaline Phosphatase: 75 U/L (ref 39–117)
BUN: 9 mg/dL (ref 6–23)
CO2: 25 mEq/L (ref 19–32)
Chloride: 108 mEq/L (ref 96–112)
GFR calc Af Amer: >60 mL/min (ref >60)
GFR calc non Af Amer: >60 mL/min (ref >60)
Potassium: 4.2 mEq/L (ref 3.5–5.1)
Total Bilirubin: 0.5 mg/dL (ref 0.3–1.2)

## 2011-03-10 LAB — DIFFERENTIAL
Basophils Absolute: 0 10*3/uL (ref 0.0–0.1)
Basophils Relative: 0 % (ref 0–1)
Eosinophils Relative: 4 % (ref 0–5)
Monocytes Absolute: 0.2 10*3/uL (ref 0.1–1.0)

## 2011-03-10 LAB — URINALYSIS, ROUTINE W REFLEX MICROSCOPIC
Bilirubin Urine: NEGATIVE
Glucose, UA: NEGATIVE mg/dL
Ketones, ur: NEGATIVE mg/dL
Leukocytes, UA: NEGATIVE
Protein, ur: NEGATIVE mg/dL
pH: 7.5 (ref 5.0–8.0)

## 2011-03-10 LAB — URINE MICROSCOPIC-ADD ON

## 2011-03-10 LAB — CBC
HCT: 35.1 % — ABNORMAL LOW (ref 36.0–46.0)
Platelets: 196 10*3/uL (ref 150–400)
RBC: 3.83 MIL/uL — ABNORMAL LOW (ref 3.87–5.11)
WBC: 5.3 10*3/uL (ref 4.0–10.5)

## 2011-03-11 ENCOUNTER — Ambulatory Visit: Payer: Self-pay | Admitting: Physical Therapy

## 2011-03-15 ENCOUNTER — Encounter: Payer: Self-pay | Admitting: Internal Medicine

## 2011-03-15 DIAGNOSIS — E0520 Thyrotoxicosis with toxic multinodular goiter without thyrotoxic crisis or storm: Secondary | ICD-10-CM

## 2011-03-18 ENCOUNTER — Ambulatory Visit: Payer: Medicare Other | Attending: Orthopedic Surgery | Admitting: Physical Therapy

## 2011-03-18 DIAGNOSIS — M25569 Pain in unspecified knee: Secondary | ICD-10-CM | POA: Insufficient documentation

## 2011-03-18 DIAGNOSIS — M25669 Stiffness of unspecified knee, not elsewhere classified: Secondary | ICD-10-CM | POA: Insufficient documentation

## 2011-03-18 DIAGNOSIS — IMO0001 Reserved for inherently not codable concepts without codable children: Secondary | ICD-10-CM | POA: Insufficient documentation

## 2011-03-31 ENCOUNTER — Ambulatory Visit: Payer: Medicare Other | Attending: Orthopedic Surgery

## 2011-03-31 DIAGNOSIS — M25669 Stiffness of unspecified knee, not elsewhere classified: Secondary | ICD-10-CM | POA: Insufficient documentation

## 2011-03-31 DIAGNOSIS — IMO0001 Reserved for inherently not codable concepts without codable children: Secondary | ICD-10-CM | POA: Insufficient documentation

## 2011-03-31 DIAGNOSIS — M25569 Pain in unspecified knee: Secondary | ICD-10-CM | POA: Insufficient documentation

## 2011-04-02 ENCOUNTER — Ambulatory Visit: Payer: Medicare Other | Admitting: Physical Therapy

## 2011-04-04 ENCOUNTER — Encounter: Payer: Medicare Other | Admitting: Physical Therapy

## 2011-04-07 ENCOUNTER — Ambulatory Visit: Payer: Medicare Other

## 2011-04-08 LAB — URINALYSIS, ROUTINE W REFLEX MICROSCOPIC
Bilirubin Urine: NEGATIVE
Glucose, UA: NEGATIVE mg/dL
Ketones, ur: NEGATIVE mg/dL
Protein, ur: NEGATIVE mg/dL

## 2011-04-08 LAB — CBC
HCT: 31.7 % — ABNORMAL LOW (ref 36.0–46.0)
MCV: 72.6 fL — ABNORMAL LOW (ref 78.0–100.0)
Platelets: 348 10*3/uL (ref 150–400)
RDW: 21.3 % — ABNORMAL HIGH (ref 11.5–15.5)
WBC: 4.7 10*3/uL (ref 4.0–10.5)

## 2011-04-08 LAB — URINE MICROSCOPIC-ADD ON

## 2011-04-08 LAB — WET PREP, GENITAL
Trich, Wet Prep: NONE SEEN
Yeast Wet Prep HPF POC: NONE SEEN

## 2011-04-08 LAB — DIFFERENTIAL
Band Neutrophils: 0 % (ref 0–10)
Basophils Absolute: 0 10*3/uL (ref 0.0–0.1)
Basophils Relative: 0 % (ref 0–1)
Eosinophils Absolute: 0.2 10*3/uL (ref 0.0–0.7)
Eosinophils Relative: 5 % (ref 0–5)
Myelocytes: 0 %
Promyelocytes Absolute: 0 %

## 2011-04-08 LAB — HCG, QUANTITATIVE, PREGNANCY: hCG, Beta Chain, Quant, S: <2 m[IU]/mL (ref <5)

## 2011-04-08 LAB — POCT PREGNANCY, URINE: Preg Test, Ur: NEGATIVE

## 2011-04-09 ENCOUNTER — Ambulatory Visit: Payer: Medicare Other | Admitting: Physical Therapy

## 2011-04-11 ENCOUNTER — Ambulatory Visit: Payer: Medicare Other | Admitting: Physical Therapy

## 2011-04-14 ENCOUNTER — Ambulatory Visit: Payer: Medicare Other | Admitting: Physical Therapy

## 2011-04-16 ENCOUNTER — Ambulatory Visit: Payer: Medicare Other | Admitting: Physical Therapy

## 2011-04-18 ENCOUNTER — Encounter: Payer: Medicare Other | Admitting: Physical Therapy

## 2011-05-02 ENCOUNTER — Other Ambulatory Visit: Payer: Self-pay | Admitting: Family Medicine
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
## 2011-05-13 ENCOUNTER — Encounter: Payer: Self-pay | Admitting: Internal Medicine

## 2011-07-29 ENCOUNTER — Other Ambulatory Visit: Payer: Self-pay | Admitting: Otolaryngology

## 2011-07-29 DIAGNOSIS — E041 Nontoxic single thyroid nodule: Secondary | ICD-10-CM

## 2011-08-05 ENCOUNTER — Other Ambulatory Visit (HOSPITAL_COMMUNITY)
Admission: RE | Admit: 2011-08-05 | Discharge: 2011-08-05 | Disposition: A | Discharge status: Home / Self Care | Payer: Medicare Other | Source: Ambulatory Visit | Attending: Diagnostic Radiology | Admitting: Diagnostic Radiology

## 2011-08-05 ENCOUNTER — Ambulatory Visit
Admission: RE | Admit: 2011-08-05 | Discharge: 2011-08-05 | Disposition: A | Discharge status: Home / Self Care | Payer: Medicare Other | Source: Ambulatory Visit | Attending: Otolaryngology | Admitting: Otolaryngology

## 2011-08-05 DIAGNOSIS — E049 Nontoxic goiter, unspecified: Secondary | ICD-10-CM | POA: Insufficient documentation

## 2011-08-05 DIAGNOSIS — E041 Nontoxic single thyroid nodule: Secondary | ICD-10-CM

## 2011-08-21 ENCOUNTER — Other Ambulatory Visit (HOSPITAL_COMMUNITY): Payer: Medicare Other

## 2011-08-27 ENCOUNTER — Ambulatory Visit (HOSPITAL_COMMUNITY): Admission: RE | Admit: 2011-08-27 | Payer: Medicare Other | Source: Ambulatory Visit | Admitting: Otolaryngology

## 2011-09-15 LAB — COMPREHENSIVE METABOLIC PANEL
ALT: 23
AST: 5
Alkaline Phosphatase: 70
CO2: 23
Calcium: 8.2 — ABNORMAL LOW
GFR calc Af Amer: >60
Glucose, Bld: 97
Potassium: 4.6
Sodium: 136
Total Protein: 6.6

## 2011-09-15 LAB — BASIC METABOLIC PANEL
BUN: 6
CO2: 23
CO2: 26
Calcium: 8.7
Calcium: 8.8
Chloride: 106
Creatinine, Ser: 0.56
GFR calc Af Amer: >60
GFR calc non Af Amer: >60
Glucose, Bld: 137 — ABNORMAL HIGH
Glucose, Bld: 95
Sodium: 135

## 2011-09-15 LAB — CBC
HCT: 26.6 — ABNORMAL LOW
Hemoglobin: 7.7 — CL
Hemoglobin: 8.5 — ABNORMAL LOW
Hemoglobin: 8.7 — ABNORMAL LOW
MCHC: 32
MCHC: 32
MCV: 73.8 — ABNORMAL LOW
Platelets: 244
RBC: 3.6 — ABNORMAL LOW
RBC: 3.69 — ABNORMAL LOW
RDW: 20.5 — ABNORMAL HIGH
RDW: 20.5 — ABNORMAL HIGH
RDW: 20.8 — ABNORMAL HIGH

## 2011-09-15 LAB — PROTIME-INR: Prothrombin Time: 13.5

## 2011-10-08 ENCOUNTER — Ambulatory Visit: Payer: Medicare Other | Attending: Orthopedic Surgery | Admitting: Physical Therapy

## 2011-10-08 DIAGNOSIS — IMO0001 Reserved for inherently not codable concepts without codable children: Secondary | ICD-10-CM | POA: Insufficient documentation

## 2011-10-08 DIAGNOSIS — M25579 Pain in unspecified ankle and joints of unspecified foot: Secondary | ICD-10-CM | POA: Insufficient documentation

## 2011-10-08 DIAGNOSIS — M2560 Stiffness of unspecified joint, not elsewhere classified: Secondary | ICD-10-CM | POA: Insufficient documentation

## 2011-10-08 DIAGNOSIS — M6281 Muscle weakness (generalized): Secondary | ICD-10-CM | POA: Insufficient documentation

## 2011-10-08 DIAGNOSIS — R5381 Other malaise: Secondary | ICD-10-CM | POA: Insufficient documentation

## 2011-10-14 ENCOUNTER — Encounter: Payer: Medicare Other | Admitting: Physical Therapy

## 2011-10-15 ENCOUNTER — Other Ambulatory Visit: Payer: Self-pay | Admitting: Orthopedic Surgery

## 2011-10-15 ENCOUNTER — Ambulatory Visit (HOSPITAL_COMMUNITY)
Admission: RE | Admit: 2011-10-15 | Discharge: 2011-10-15 | Disposition: A | Discharge status: Home / Self Care | Payer: Medicare Other | Source: Ambulatory Visit | Attending: Orthopedic Surgery | Admitting: Orthopedic Surgery

## 2011-10-15 ENCOUNTER — Encounter (HOSPITAL_COMMUNITY): Payer: Medicare Other

## 2011-10-15 ENCOUNTER — Other Ambulatory Visit (HOSPITAL_COMMUNITY): Payer: Self-pay | Admitting: Orthopedic Surgery

## 2011-10-15 DIAGNOSIS — M25569 Pain in unspecified knee: Secondary | ICD-10-CM | POA: Insufficient documentation

## 2011-10-15 DIAGNOSIS — M1710 Unilateral primary osteoarthritis, unspecified knee: Secondary | ICD-10-CM | POA: Insufficient documentation

## 2011-10-15 DIAGNOSIS — Z01818 Encounter for other preprocedural examination: Secondary | ICD-10-CM | POA: Insufficient documentation

## 2011-10-15 DIAGNOSIS — I10 Essential (primary) hypertension: Secondary | ICD-10-CM | POA: Insufficient documentation

## 2011-10-15 DIAGNOSIS — E039 Hypothyroidism, unspecified: Secondary | ICD-10-CM | POA: Insufficient documentation

## 2011-10-15 DIAGNOSIS — Z01810 Encounter for preprocedural cardiovascular examination: Secondary | ICD-10-CM | POA: Insufficient documentation

## 2011-10-15 DIAGNOSIS — Z79899 Other long term (current) drug therapy: Secondary | ICD-10-CM | POA: Insufficient documentation

## 2011-10-15 DIAGNOSIS — Z01812 Encounter for preprocedural laboratory examination: Secondary | ICD-10-CM | POA: Insufficient documentation

## 2011-10-15 LAB — CBC
Hemoglobin: 9.5 g/dL — ABNORMAL LOW (ref 12.0–15.0)
MCHC: 30.6 g/dL (ref 30.0–36.0)
RDW: 18.8 % — ABNORMAL HIGH (ref 11.5–15.5)
WBC: 4.5 10*3/uL (ref 4.0–10.5)

## 2011-10-15 LAB — COMPREHENSIVE METABOLIC PANEL
BUN: 14 mg/dL (ref 6–23)
Calcium: 9 mg/dL (ref 8.4–10.5)
GFR calc Af Amer: >90 mL/min (ref >90)
Glucose, Bld: 81 mg/dL (ref 70–99)
Total Protein: 7.3 g/dL (ref 6.0–8.3)

## 2011-10-15 LAB — URINALYSIS, ROUTINE W REFLEX MICROSCOPIC
Bilirubin Urine: NEGATIVE
Hgb urine dipstick: NEGATIVE
Nitrite: NEGATIVE
Protein, ur: NEGATIVE mg/dL
Urobilinogen, UA: 0.2 mg/dL (ref 0.0–1.0)

## 2011-10-15 LAB — SURGICAL PCR SCREEN
MRSA, PCR: NEGATIVE
Staphylococcus aureus: NEGATIVE

## 2011-10-15 LAB — APTT: aPTT: 32 seconds (ref 24–37)

## 2011-10-15 LAB — PROTIME-INR
INR: 0.99 (ref 0.00–1.49)
Prothrombin Time: 13.3 seconds (ref 11.6–15.2)

## 2011-10-15 LAB — HCG, SERUM, QUALITATIVE: Preg, Serum: NEGATIVE

## 2011-10-15 LAB — DIFFERENTIAL
Basophils Absolute: 0 10*3/uL (ref 0.0–0.1)
Basophils Relative: 0 % (ref 0–1)
Lymphocytes Relative: 38 % (ref 12–46)
Monocytes Absolute: 0.4 10*3/uL (ref 0.1–1.0)
Neutro Abs: 2.2 10*3/uL (ref 1.7–7.7)

## 2011-10-16 ENCOUNTER — Ambulatory Visit: Payer: Medicare Other | Admitting: Physical Therapy

## 2011-10-17 ENCOUNTER — Encounter: Payer: Medicare Other | Admitting: Physical Therapy

## 2011-10-20 ENCOUNTER — Encounter: Payer: Medicare Other | Admitting: Physical Therapy

## 2011-10-22 ENCOUNTER — Encounter: Payer: Medicare Other | Admitting: Physical Therapy

## 2011-10-24 ENCOUNTER — Inpatient Hospital Stay (HOSPITAL_COMMUNITY)
Admission: RE | Admit: 2011-10-24 | Discharge: 2011-10-28 | DRG: 470 | Disposition: A | Discharge status: Home Health | Payer: Medicare Other | Source: Ambulatory Visit | Attending: Orthopedic Surgery | Admitting: Orthopedic Surgery

## 2011-10-24 ENCOUNTER — Inpatient Hospital Stay (HOSPITAL_COMMUNITY): Payer: Medicare Other

## 2011-10-24 DIAGNOSIS — Z01810 Encounter for preprocedural cardiovascular examination: Secondary | ICD-10-CM

## 2011-10-24 DIAGNOSIS — F17200 Nicotine dependence, unspecified, uncomplicated: Secondary | ICD-10-CM | POA: Diagnosis present

## 2011-10-24 DIAGNOSIS — I10 Essential (primary) hypertension: Secondary | ICD-10-CM | POA: Diagnosis present

## 2011-10-24 DIAGNOSIS — E039 Hypothyroidism, unspecified: Secondary | ICD-10-CM | POA: Diagnosis present

## 2011-10-24 DIAGNOSIS — M1990 Unspecified osteoarthritis, unspecified site: Secondary | ICD-10-CM

## 2011-10-24 DIAGNOSIS — M21069 Valgus deformity, not elsewhere classified, unspecified knee: Secondary | ICD-10-CM | POA: Diagnosis present

## 2011-10-24 DIAGNOSIS — Z01812 Encounter for preprocedural laboratory examination: Secondary | ICD-10-CM

## 2011-10-24 DIAGNOSIS — M1710 Unilateral primary osteoarthritis, unspecified knee: Principal | ICD-10-CM | POA: Diagnosis present

## 2011-10-24 LAB — ABO/RH: ABO/RH(D): AB POS

## 2011-10-24 MED ORDER — HEPARIN SODIUM (PORCINE) 5000 UNIT/ML IJ SOLN: 5000.0000 [IU] | Freq: Two times a day (BID) | INTRAMUSCULAR | Status: DC

## 2011-10-25 LAB — HEMOGLOBIN AND HEMATOCRIT, BLOOD: Hemoglobin: 7.8 g/dL — ABNORMAL LOW (ref 12.0–15.0)

## 2011-10-25 MED ORDER — ALUM & MAG HYDROXIDE-SIMETH 200-200-20 MG/5ML PO SUSP: 30.0000 mL | ORAL | Status: DC | PRN

## 2011-10-25 MED ORDER — METHOCARBAMOL 500 MG PO TABS
500.0000 mg | ORAL_TABLET | Freq: Four times a day (QID) | ORAL | Status: DC | PRN
  Administered 2011-10-26 – 2011-10-27 (×4): 500 mg via ORAL

## 2011-10-25 MED ORDER — PHENOL 1.4 % MT LIQD
1.0000 | OROMUCOSAL | Status: DC | PRN
  Filled 2011-10-25: qty 177

## 2011-10-25 MED ORDER — ATENOLOL 50 MG PO TABS
50.0000 mg | ORAL_TABLET | Freq: Every day | ORAL | Status: DC
  Administered 2011-10-26 – 2011-10-28 (×3): 50 mg via ORAL
  Filled 2011-10-25 (×4): qty 1

## 2011-10-25 MED ORDER — HYDROMORPHONE HCL PF 1 MG/ML IJ SOLN
0.5000 mg | INTRAMUSCULAR | Status: DC | PRN
  Administered 2011-10-26 (×4): 0.5 mg via INTRAVENOUS

## 2011-10-25 MED ORDER — ACETAMINOPHEN 325 MG PO TABS: 325.0000 mg | ORAL_TABLET | ORAL | Status: DC | PRN

## 2011-10-25 MED ORDER — BISACODYL 5 MG PO TBEC: 10.0000 mg | DELAYED_RELEASE_TABLET | Freq: Every day | ORAL | Status: DC | PRN

## 2011-10-25 MED ORDER — ZOLPIDEM TARTRATE 10 MG PO TABS
10.0000 mg | ORAL_TABLET | Freq: Every evening | ORAL | Status: DC | PRN
  Administered 2011-10-27 (×2): 10 mg via ORAL
  Filled 2011-10-25 (×2): qty 1

## 2011-10-25 MED ORDER — ONDANSETRON HCL 4 MG PO TABS: 4.0000 mg | ORAL_TABLET | Freq: Four times a day (QID) | ORAL | Status: DC | PRN

## 2011-10-25 MED ORDER — MENTHOL 3 MG MT LOZG
1.0000 | LOZENGE | OROMUCOSAL | Status: DC | PRN
  Filled 2011-10-25: qty 9

## 2011-10-25 MED ORDER — OXYCODONE-ACETAMINOPHEN 5-325 MG PO TABS
1.0000 | ORAL_TABLET | ORAL | Status: DC | PRN
  Administered 2011-10-26 – 2011-10-28 (×9): 2 via ORAL
  Filled 2011-10-25 (×3): qty 2

## 2011-10-25 MED ORDER — BISACODYL 10 MG RE SUPP: 10.0000 mg | Freq: Every day | RECTAL | Status: DC | PRN

## 2011-10-25 MED ORDER — SALINE SPRAY 0.65 % NA SOLN
3.0000 | Freq: Every day | NASAL | Status: DC | PRN
  Filled 2011-10-25: qty 44

## 2011-10-25 MED ORDER — FLEET ENEMA 7-19 GM/118ML RE ENEM: 1.0000 | ENEMA | Freq: Every day | RECTAL | Status: DC | PRN

## 2011-10-25 MED ORDER — DOCUSATE SODIUM 100 MG PO CAPS
100.0000 mg | ORAL_CAPSULE | Freq: Two times a day (BID) | ORAL | Status: DC
  Administered 2011-10-26 – 2011-10-28 (×5): 100 mg via ORAL
  Filled 2011-10-25 (×9): qty 1

## 2011-10-25 MED ORDER — ACETAMINOPHEN 650 MG RE SUPP: 325.0000 mg | RECTAL | Status: DC | PRN

## 2011-10-25 MED ORDER — METHOCARBAMOL 100 MG/ML IJ SOLN
500.0000 mg | Freq: Four times a day (QID) | INTRAMUSCULAR | Status: DC | PRN
  Filled 2011-10-25: qty 5

## 2011-10-25 MED ORDER — KETOROLAC TROMETHAMINE 30 MG/ML IJ SOLN
30.0000 mg | Freq: Two times a day (BID) | INTRAMUSCULAR | Status: DC
  Filled 2011-10-25 (×2): qty 1

## 2011-10-25 MED ORDER — HEPARIN SODIUM (PORCINE) 5000 UNIT/ML IJ SOLN
5000.0000 [IU] | Freq: Two times a day (BID) | INTRAMUSCULAR | Status: DC
  Administered 2011-10-25 – 2011-10-28 (×5): 5000 [IU] via SUBCUTANEOUS
  Filled 2011-10-25 (×8): qty 1

## 2011-10-25 MED ORDER — LACTATED RINGERS IV SOLN: INTRAVENOUS | Status: DC

## 2011-10-25 MED ORDER — FERROUS SULFATE 325 (65 FE) MG PO TABS
325.0000 mg | ORAL_TABLET | Freq: Three times a day (TID) | ORAL | Status: DC
  Administered 2011-10-26 – 2011-10-28 (×7): 325 mg via ORAL
  Filled 2011-10-25 (×13): qty 1

## 2011-10-25 NOTE — H&P (Signed)
  NAMESALLI, Lambert               ACCOUNT NO.:  [id number]  MEDICAL RECORD NO.:  [id number]  LOCATION:  PADM                         FACILITY:  [facility]  PHYSICIAN:  Maisie Fus B. Dixon, P.A.  DATE OF BIRTH:  1966-11-25  DATE OF ADMISSION:  10/15/2011 DATE OF DISCHARGE:                             HISTORY & PHYSICAL   CHIEF COMPLAINT:  Right knee pain.  HPI:  The patient is a 45 year old female with worsening right knee pain secondary to osteoarthritis.  The patient elected to have a right total knee arthroplasty by Dr. Ranee Gosselin to decrease pain and increase function.  PAST MEDICAL HISTORY: 1. Hypertension. 2. Hypothyroidism that is being monitored with biopsies only.     __________  SOCIAL HISTORY:  The patient of Dr. Gaynell Face.  Does smoke, does not use alcohol.  DRUG ALLERGIES:  None.  CURRENT MEDICATIONS:  Atenolol, unknown dose.  REVIEW OF SYSTEMS:  Shows pain on range of motion.  Pain with ambulation.  Otherwise review of systems is negative.  PHYSICAL EXAMINATION:  VITAL SIGNS:  Pulse 68, respirations 16, blood pressure 142/80. GENERAL:  The patient is a healthy-appearing 45 year old female in no acute distress.  Pleasant mood and affect.  Alert and oriented x3. HEENT:  Examination of head and neck shows cranial nerves 2-12 grossly intact. NECK:  Shows full range of motion without any tenderness. CHEST:  Active breath sounds bilaterally.  No wheezes, rhonchi, or rales. HEART:  Regular rate and rhythm.  No murmur. ABDOMEN:  Nontender, nondistended with active bowel sounds. EXTREMITIES:  Moderate tenderness to right knee with medial and lateral joint lines with crepitus.  She does have mildly antalgic gait. Neurovascularly she is intact distally with no pedal edema.  X-RAYS:  Show end-stage osteoarthritis of right knee.  IMPRESSION:  End-stage osteoarthritis, right knee.  PLAN OF ACTION:  Right total knee arthroplasty by Dr. Ranee Gosselin.     Thomas B.  Ferne Coe.     TBD/MEDQ  D:  10/16/2011  T:  10/16/2011  Job:  [id number]  Electronically Signed by Standley Dakins P.A. on 10/21/2011 11:40:48 AM Electronically Signed by Ranee Gosselin M.D. on 10/25/2011 08:22:31 AM

## 2011-10-25 NOTE — Op Note (Signed)
NAMEJEANET, LUPE               ACCOUNT NO.:  [id number]  MEDICAL RECORD NO.:  [id number]  LOCATION:  0002                         FACILITY:  [facility]  PHYSICIAN:  Georges Lynch. Moses Odoherty, M.D.DATE OF BIRTH:  1966/11/09  DATE OF PROCEDURE: DATE OF DISCHARGE:                              OPERATIVE REPORT   ASSISTANT:  Jaquelyn Bitter. Chabon, P.A.  PREOPERATIVE DIAGNOSIS:  Severe degenerative arthritis with a valgus deformity, right knee.  POSTOPERATIVE DIAGNOSIS:  Severe degenerative arthritis with a valgus deformity, right knee.  OPERATION:  Right total knee arthroplasty.  The sizes used; we used a DePuy system with rotating platform. Gentamicin was used in the cement.  The tibial tray was a size 4 cemented, the tibial insert was a size 4, 12.5 mm thickness insert.  The patella was a size 41.  The tibial tray was an MBT tibial tray  DESCRIPTION OF THE PROCEDURE:  Under general anesthesia, routine orthopedic prep and draping of the right lower extremity was carried out.  The appropriate time-out was carried out prior to making the incision.  Also marked the appropriate right leg in the recovery in the holding area.  The leg was exsanguinated and Esmarch tourniquet was elevated to 350 mmHg.  The leg was placed in the [facility] leg holder.  At this time, an incision was made over the anterior aspect of the right knee.  Bleeders identified and cauterized.  I then created 2 flaps.  I then carried out a median parapatellar incision.  The patella was reflected laterally.  The knee was flexed and I did medial and lateral meniscectomies and excised the anterior and posterior cruciate ligaments.  Following that, initial drill hole was made in the intercondylar notch.  I then removed 12 mm thickness off the distal femur in the usual fashion.  At this time, the femur was measured to be a size 4 right.  Following that, I then cut my anterior and posterior chamfering cuts of the distal femur in right  leg and thoroughly irrigated out the femoral canal prior to doing this.  Following that, we then prepared the tibia.  I removed 6 mm thickness off the affected lateral side of the tibia.  At this time, we utilized the MBT tray.  We then cut our keel cut out after we utilized our spacer blocks and we finally decided to use a 12.5 mm thickness insert.  After we made the appropriate cuts and wound VAC up and the notch cut out the distal femur.  We inserted the trial components and extended the knee and did my resurfacing procedure on the patella and then made 3 drill holes in the patella for a size 41 mm patella.  All trial components were removed.  We thoroughly irrigated out the knee, cemented all 3 components in simultaneously.  Once the cement was hardened, we looked for loose pieces of cement.  We removed all loose pieces.  Then I thoroughly water picked out the knee as well.  At this time, I inserted my 12.5 mm thickness trial and it fit very well via good stability, good flexion and extension, so we removed the trial and inserted our permanent rotating platform  size 4 12.5 mm thickness.  We then reduced the knee, inserted Hemovac drain and then closed the knee in layered usual fashion.  The tourniquet was let down after we closed the capsule. We had good control of the bleeding.  At this time, sterile Neosporin dressing was applied after the wound was closed.  ADDENDUM:  Jodene Nam was the assistant, who assisted in retraction and in cauterization during the procedure.  Jeannett Senior also assisted in the cement application and cement removal during the procedure.  He also was very helpful in regards to the entire procedure as far as insertion of the prosthesis and holding the prosthesis in place in certain intervals. The reason for the surgery was she had bone on bone.  In the lateral compartment, she had previous arthroscopic surgery and she had chronic pain.           ______________________________ Georges Lynch. Darrelyn Hillock, M.D.     RAG/MEDQ  D:  10/24/2011  T:  10/24/2011  Job:  [id number]  Electronically Signed by Ranee Gosselin M.D. on 10/25/2011 08:22:33 AM

## 2011-10-26 LAB — HEMOGLOBIN AND HEMATOCRIT, BLOOD
HCT: 25.2 % — ABNORMAL LOW (ref 36.0–46.0)
Hemoglobin: 8 g/dL — ABNORMAL LOW (ref 12.0–15.0)

## 2011-10-26 MED ORDER — METHOCARBAMOL 500 MG PO TABS
ORAL_TABLET | ORAL | Status: AC
  Filled 2011-10-26: qty 1

## 2011-10-26 MED ORDER — OXYCODONE-ACETAMINOPHEN 5-325 MG PO TABS
ORAL_TABLET | ORAL | Status: AC
  Administered 2011-10-27: 2 via ORAL
  Filled 2011-10-26: qty 2

## 2011-10-26 MED ORDER — KETOROLAC TROMETHAMINE 30 MG/ML IJ SOLN
INTRAMUSCULAR | Status: AC
  Filled 2011-10-26: qty 1

## 2011-10-26 MED ORDER — HYDROMORPHONE HCL PF 1 MG/ML IJ SOLN
INTRAMUSCULAR | Status: AC
  Filled 2011-10-26: qty 1

## 2011-10-26 MED ORDER — METHOCARBAMOL 500 MG PO TABS
ORAL_TABLET | ORAL | Status: AC
  Administered 2011-10-26: 500 mg via ORAL
  Filled 2011-10-26: qty 1

## 2011-10-26 MED ORDER — HYDROMORPHONE HCL PF 1 MG/ML IJ SOLN
INTRAMUSCULAR | Status: AC
  Administered 2011-10-26: 0.5 mg via INTRAVENOUS
  Filled 2011-10-26: qty 1

## 2011-10-26 MED ORDER — OXYCODONE-ACETAMINOPHEN 5-325 MG PO TABS
ORAL_TABLET | ORAL | Status: AC
  Filled 2011-10-26: qty 2

## 2011-10-26 MED ORDER — OXYCODONE-ACETAMINOPHEN 5-325 MG PO TABS
ORAL_TABLET | ORAL | Status: AC
  Administered 2011-10-26: 2 via ORAL
  Filled 2011-10-26: qty 2

## 2011-10-26 MED ORDER — WARFARIN SODIUM 7.5 MG PO TABS
7.5000 mg | ORAL_TABLET | Freq: Once | ORAL | Status: AC
  Administered 2011-10-26: 7.5 mg via ORAL
  Filled 2011-10-26: qty 1

## 2011-10-26 NOTE — Progress Notes (Signed)
[name]  MRN: [id number] DOB/Age: 1966/02/21 45 y.o. Physician: Georges Lynch. Kendal Ghazarian Procedure:     All labs and vitals reviewed. Plan on DC Tues.  AM.  Vital Signs Temp:  [98.1 F (36.7 C)-98.8 F (37.1 C)] 98.8 F (37.1 C) (11/04 0540) Pulse Rate:  [70-84] 84  (11/04 0540) Resp:  [20-22] 22  (11/04 0540) BP: (136-148)/(80-94) 148/94 mmHg (11/04 0540) SpO2:  [96 %-98 %] 96 % (11/04 0540)  Lab Results  Basename 10/26/11 0526 10/25/11 0425  WBC -- --  HGB 8.0* 7.8*  HCT 25.2* 25.0*  PLT -- --   BMET No results found for this basename: NA:2,K:2,CL:2,CO2:2,GLUCOSE:2,BUN:2,CREATININE:2,CALCIUM:2 in the last 72 hours CBG: NA INR  Date Value Range Status  10/26/2011 1.43  0.00-1.49 (no units) Final    Exam Extremities: Homans sign is negative, no sign of DVT Pulses: Right Pulse, DP: present 2+,  Neurologic:Motor strength in foot intact Incision/Wound:Excellent   Anticoagulant: On Coumadin  Hemovac: DC'D yesterday  Post-Op X-Ray: normal  Transfusion: Watching Hbg  Plan DC on Tuesday Return to clinic for follow-up: 2- Weeks after Surgical Date   Rachid Parham A 10/26/2011, 7:16 AM

## 2011-10-26 NOTE — Plan of Care (Signed)
Problem: Consults Goal: Diagnosis- Total Joint Replacement Primary Total Knee     

## 2011-10-26 NOTE — Plan of Care (Signed)
Problem: Consults Goal: Diagnosis- Total Joint Replacement Outcome: Progressing Primary Total Knee     

## 2011-10-26 NOTE — Progress Notes (Signed)
Physical therapy treatment documented on paper by Mauro Kaufmann, PT and placed in chart.  Treating PT does not have access to [facility] at this time.

## 2011-10-26 NOTE — Progress Notes (Signed)
ANTICOAGULATION CONSULT NOTE - Follow Up Consult  Pharmacy Consult for Warfarin Indication: VTE prophylaxis  No Known Allergies  Patient Measurements: Height: 5\' 6"  (167.6 cm) (Entered for Cutover) Weight: 254 lb 1.6 oz (115.26 kg) (Entered for Cutover) IBW/kg (Calculated) : 59.3  A  Vital Signs: Temp: 98.8 F (37.1 C) (11/04 0540) Temp src: Oral (11/04 0300) BP: 148/94 mmHg (11/04 0540) Pulse Rate: 84  (11/04 0540)  Labs:  Basename 10/26/11 0526 10/25/11 0425  HGB 8.0* 7.8*  HCT 25.2* 25.0*  PLT -- --  APTT -- --  LABPROT 17.7* 14.7  INR 1.43 1.13  HEPARINUNFRC -- --  CREATININE -- --  CKTOTAL -- --  CKMB -- --  TROPONINI -- --   Estimated Creatinine Clearance: 113.3 ml/min (by C-G formula based on Cr of 0.73).   Medications:  Warfarin dosages since 10/24/11: 10mg , 7.5mg  PO Heparin 5000 units SQ q12h.  Atenolol 50mg  PO daily Docusate 100mg  PO bid Ferrous Sulfate 325mg  PO TID after meals Toradol 30mg  IV q12h   Assessment:  Warfarin initiation in progress, INR responding.  Addition of Toradol noted.  Goal of Therapy:   INR 2-3   Plan:   Repeat warfarin 7.5mg  PO today.  Continue SQ heparin while INR < 2  Suggest limiting duration of Toradol in combination with warfarin / heparin.  Jamis Kryder, Brynda Greathouse K 10/26/2011,9:43 AM

## 2011-10-26 NOTE — Progress Notes (Signed)
Occupational Therapy Evaluation Patient Details Name: NYCOLE KAWAHARA MRN: [id number] DOB: 1966/12/05 Today's Date: 10/26/2011 [id number] EV2 Problem List:  Patient Active Problem List  Diagnoses  . GOITER, TOXIC, MULTINODULAR  . MORBID OBESITY  . ANEMIA, VITAMIN B12 DEFICIENCY  . HYPERTENSION  . SORE THROAT  . Pain in soft tissues of limb  . ANEMIA, HX OF    Past Medical History: No past medical history on file. Past Surgical History:  Past Surgical History  Procedure Date  . Ankle surgery 2009    broken right ankle     OT Assessment/Plan/Recommendation OT Assessment Clinical Impression Statement: Will follow for  ADL acutely with supervision. OT Recommendation/Assessment: Patient will need skilled OT in the acute care venue OT Problem List: Decreased knowledge of use of DME or AE OT Therapy Diagnosis : Generalized weakness;Acute pain OT Plan OT Frequency: Min 2X/week OT Treatment/Interventions: Self-care/ADL training;DME and/or AE instruction;Patient/family education OT Recommendation Follow Up Recommendations: None Equipment Recommended: 3 in 1 bedside comode Individuals Consulted Consulted and Agree with Results and Recommendations: Patient OT Goals Acute Rehab OT Goals OT Goal Formulation: With patient Time For Goal Achievement: 7 days ADL Goals Pt Will Perform Grooming: with supervision;Standing at sink (3 activities) ADL Goal: Grooming - Progress: Progressing toward goals Pt Will Perform Lower Body Bathing: with supervision;Sitting, edge of bed;Sit to stand from bed;with adaptive equipment ADL Goal: Lower Body Bathing - Progress: Progressing toward goals Pt Will Perform Lower Body Dressing: with supervision;with adaptive equipment;Sit to stand from bed;Sitting, bed ADL Goal: Lower Body Dressing - Progress: Progressing toward goals Pt Will Perform Toileting - Clothing Manipulation: with supervision;Standing ADL Goal: Toileting - Clothing Manipulation - Progress:  Progressing toward goals Pt Will Perform Toileting - Hygiene: with set-up;Sit to stand from 3-in-1/toilet ADL Goal: Toileting - Hygiene - Progress: Progressing toward goals  OT Evaluation Precautions/Restrictions  Precautions Precautions: Fall Required Braces or Orthoses: Yes Knee Immobilizer: On when out of bed or walking Restrictions Weight Bearing Restrictions: No Prior Functioning Home Living Lives With: Significant other;Son Bathroom Shower/Tub: Tub/shower unit;Curtain Bathroom Toilet: Engineer, mining: None Prior Function Level of Independence: Independent with basic ADLs;Independent with homemaking with ambulation;Independent with gait;Independent with transfers ADL ADL Eating/Feeding: Simulated;Independent Where Assessed - Eating/Feeding: Chair Grooming: Performed;Wash/dry hands;Minimal assistance Where Assessed - Grooming: Standing at sink Upper Body Bathing: Simulated;Set up Where Assessed - Upper Body Bathing: Unsupported;Sitting, bed Lower Body Bathing: Simulated;Moderate assistance Where Assessed - Lower Body Bathing: Sit to stand from bed;Sitting, bed;Unsupported Upper Body Dressing: Performed;Minimal assistance Where Assessed - Upper Body Dressing: Standing (front opening gown) Lower Body Dressing: Moderate assistance;Simulated Where Assessed - Lower Body Dressing: Sit to stand from bed;Unsupported;Sitting, bed Toilet Transfer: Performed;Minimal assistance Toilet Transfer Method: Ambulating Toilet Transfer Equipment: Raised toilet seat with arms (or 3-in-1 over toilet) Toileting - Clothing Manipulation: Performed;Minimal assistance Where Assessed - Glass blower/designer Manipulation: Standing Toileting - Hygiene: Performed;Supervision/safety Where Assessed - Toileting Hygiene: Standing Equipment Used: Rolling walker Vision/Perception  Vision - History Baseline Vision: No visual deficits Cognition Cognition Arousal/Alertness:  Awake/alert Overall Cognitive Status: Appears within functional limits for tasks assessed Sensation/Coordination Coordination Gross Motor Movements are Fluid and Coordinated: Yes Fine Motor Movements are Fluid and Coordinated: Yes Extremity Assessment RUE Assessment RUE Assessment: Within Functional Limits LUE Assessment LUE Assessment: Within Functional Limits Mobility  Bed Mobility Bed Mobility: Yes Supine to Sit: 4: Min assist Supine to Sit Details (indicate cue type and reason):  (for right LE) Transfers Transfers: Yes Sit to Stand: 4: Min assist Sit  to Stand Details (indicate cue type and reason):  (verbal cues for sequence/hand placement, bed height up) Stand to Sit: 4: Min assist Exercises   End of Session OT - End of Session Equipment Utilized During Treatment: Gait belt;Right knee immobilizer Activity Tolerance: Patient tolerated treatment well Patient left: in chair (with PT) General Behavior During Session: [facility] for tasks performed Cognition: [facility] for tasks performed   Evern Bio 10/26/2011, 12:06 PM

## 2011-10-26 NOTE — Progress Notes (Signed)
Subjective: Doing well today.    Objective: Vital signs in last 24 hours: Temp:  [97 F (36.1 C)-98.5 F (36.9 C)] 98.4 F (36.9 C) (11/05 0427) Pulse Rate:  [77-88] 88  (11/05 0427) Resp:  [18-20] 18  (11/05 0427) BP: (98-131)/(63-80) 123/80 mmHg (11/05 0427) SpO2:  [94 %-97 %] 94 % (11/05 0427) Weight:  [115.26 kg (254 lb 1.6 oz)] 254 lb 1.6 oz (115.26 kg) (11/04 1700)  Intake/Output from previous day: 11/04 0701 - 11/05 0700 In: 1320 [P.O.:1320] Out: 1  Intake/Output this shift:   [name]  MRN: [id number] DOB/Age: Nov 22, 1966 45 y.o. Physician: Georges Lynch. Kaeo Jacome Procedure:   @RRDAYPOSTSURGERY @   Vital Signs Temp:  [97 F (36.1 C)-98.5 F (36.9 C)] 98.4 F (36.9 C) (11/05 0427) Pulse Rate:  [77-88] 88  (11/05 0427) Resp:  [18-20] 18  (11/05 0427) BP: (98-131)/(63-80) 123/80 mmHg (11/05 0427) SpO2:  [94 %-97 %] 94 % (11/05 0427) Weight:  [115.26 kg (254 lb 1.6 oz)] 254 lb 1.6 oz (115.26 kg) (11/04 1700)  Lab Results  Basename 10/27/11 0510 10/26/11 0526  WBC -- --  HGB 7.7* 8.0*  HCT 24.1* 25.2*  PLT -- --   BMET No results found for this basename: NA:2,K:2,CL:2,CO2:2,GLUCOSE:2,BUN:2,CREATININE:2,CALCIUM:2 in the last 72 hours CBG:  INR  Date Value Range Status  10/27/2011 1.64* 0.00-1.49 (no units) Final    Exam Extremities:Calf tenderness:none Pulses: Intact Neurologic:Dorsiflexion Intact Incision/Wound: Normal   Anticoagulant: Home on Coumadin 5mg  daily  Hemovac:dcd  Post-Op X-Ray: normal  Transfusion:none  Plan Pt to be discharged tuesday Return to clinic for follow-up:2-weeks  after Surgery   [name] A 10/27/2011, 7:47 AM                ,

## 2011-10-27 LAB — PROTIME-INR
INR: 1.64 — ABNORMAL HIGH (ref 0.00–1.49)
Prothrombin Time: 19.7 seconds — ABNORMAL HIGH (ref 11.6–15.2)

## 2011-10-27 LAB — HEMOGLOBIN AND HEMATOCRIT, BLOOD: Hemoglobin: 7.7 g/dL — ABNORMAL LOW (ref 12.0–15.0)

## 2011-10-27 MED ORDER — ZOLPIDEM TARTRATE 10 MG PO TABS
ORAL_TABLET | ORAL | Status: AC
  Administered 2011-10-27: 10 mg via ORAL
  Filled 2011-10-27: qty 1

## 2011-10-27 MED ORDER — METHOCARBAMOL 500 MG PO TABS
ORAL_TABLET | ORAL | Status: AC
  Administered 2011-10-27: 500 mg via ORAL
  Filled 2011-10-27: qty 1

## 2011-10-27 MED ORDER — WARFARIN SODIUM 7.5 MG PO TABS
7.5000 mg | ORAL_TABLET | Freq: Once | ORAL | Status: DC
  Filled 2011-10-27 (×2): qty 1

## 2011-10-27 MED ORDER — OXYCODONE-ACETAMINOPHEN 5-325 MG PO TABS
ORAL_TABLET | ORAL | Status: AC
  Filled 2011-10-27: qty 2

## 2011-10-27 NOTE — Progress Notes (Signed)
Occupational Therapy Evaluation Patient Details Name: [name] MRN: [id number] DOB: 27-Jul-1966 Today's Date: 10/27/2011 [phone] 1SC OT Assessment/Plan OT Assessment/Plan OT Plan: Discharge plan remains appropriate;Equipment recommendations need to be updated OT Frequency: Min 2X/week Follow Up Recommendations: None Equipment Recommended: 3 in 1 bedside comode;Tub/shower bench OT Goals ADL Goals ADL Goal: Grooming - Progress: Progressing toward goals ADL Goal: Lower Body Bathing - Progress: Partly met ADL Goal: Lower Body Dressing - Progress: Partly met  OT Treatment Precautions/Restrictions  Precautions Precautions: Fall Required Braces or Orthoses: Yes Restrictions Weight Bearing Restrictions: No RLE Weight Bearing: Weight bearing as tolerated   ADL ADL Grooming: Wash/dry face;Set up Where Assessed - Grooming: Unsupported;Sitting, bed Lower Body Bathing: Simulated;Minimal assistance Lower Body Bathing Details (indicate cue type and reason):  (demonstrated, simulated use of long handled sponge) Where Assessed - Lower Body Bathing: Sitting, bed;Unsupported Lower Body Dressing: Simulated;Performed;Minimal assistance Lower Body Dressing Details (indicate cue type and reason):  (demonstrated and practiced use of AE for LB dressing) Where Assessed - Lower Body Dressing: Sitting, bed;Unsupported Tub/Shower Transfer Equipment:  (demonstrated use of tub bench, pt wishes to order for home) Equipment Used: Long-handled sponge;Long-handled shoe horn;Reacher;Sock aid (tub transfer bench) Mobility  Bed Mobility Bed Mobility: Yes Supine to Sit: 4: Min assist;With rails;HOB elevated (Comment degrees) (HOB up 30 degrees, use of rail, assist for right leg) Exercises    End of Session OT - End of Session Activity Tolerance: Patient limited by fatigue;Patient limited by pain Patient left: in bed General Behavior During Session: Lethargic Cognition: WFL for tasks  performed  Evern Bio  10/27/2011, 10:52 AM

## 2011-10-27 NOTE — Progress Notes (Signed)
ANTICOAGULATION CONSULT NOTE - Follow Up Consult  Pharmacy Consult for Warfarin  Indication: VTE prophylaxis s/p R TKA  No Known Allergies  Patient Measurements: Height: 5\' 6"  (167.6 cm) Weight: 254 lb 1.6 oz (115.26 kg) IBW/kg (Calculated) : 59.3  Adjusted Body  Vital Signs: Temp: 98.4 F (36.9 C) (11/05 0427) Temp src: Oral (11/05 0427) BP: 123/80 mmHg (11/05 0427) Pulse Rate: 88  (11/05 0427)  Labs:  Basename 10/27/11 0510 10/26/11 0526 10/25/11 0425  HGB 7.7* 8.0* --  HCT 24.1* 25.2* 25.0*  PLT -- -- --  APTT -- -- --  LABPROT 19.7* 17.7* 14.7  INR 1.64* 1.43 1.13  HEPARINUNFRC -- -- --  CREATININE -- -- --  CKTOTAL -- -- --  CKMB -- -- --  TROPONINI -- -- --   Estimated Creatinine Clearance: 113.3 ml/min (by C-G formula based on Cr of 0.73).   Medications:  Scheduled:    . tenormin  50 mg Oral Daily  . docusate sodium  100 mg Oral BID  . ferrous sulfate  325 mg Oral TID PC  . heparin subcutaneous  5,000 Units Subcutaneous Q12H  . HYDROmorphone      . HYDROmorphone      . methocarbamol      . oxyCODONE-acetaminophen      . warfarin  7.5 mg Oral ONCE-1800  . DISCONTD: ketorolac  30 mg Intravenous Q12H  . DISCONTD: ketorolac        Assessment:  INR increasing effectively with 7.5mg  doses. Would advise to continue at 7.5mg  daily at discharge with daily INR monitoring until achieve stable INR.  Pt also receiving heparin 5000 units SQ until INR therapeutic. Goal of Therapy:  INR 2-3   Plan:  Warfarin 7.5mg  po once today.  Continue SQ heparin until INR>2 and f/u INR in AM.  [name] 10/27/2011,9:10 AM

## 2011-10-27 NOTE — Progress Notes (Signed)
Physical Therapy Treatment Patient Details Name: [name] MRN: [id number] DOB: 06/13/1966 Today's Date: 10/27/2011 1037-1050, 1TA PT Assessment/Plan  PT - Assessment/Plan PT Plan: Discharge plan remains appropriate PT Frequency: 7X/week Follow Up Recommendations: Home health PT Equipment Recommended: None recommended by PT PT Goals  Acute Rehab PT Goals PT Goal Formulation: With patient Time For Goal Achievement: 7 days Pt will go Sit to Supine/Side: Independently PT Goal: Sit to Supine/Side - Progress: Progressing toward goal Pt will Transfer Sit to Stand/Stand to Sit: with modified independence PT Transfer Goal: Sit to Stand/Stand to Sit - Progress: Progressing toward goal Pt will Ambulate: 51 - 150 feet;with modified independence;with rolling walker PT Goal: Ambulate - Progress: Progressing toward goal  PT Treatment Precautions/Restrictions  Precautions Precautions: Knee Required Braces or Orthoses: Yes Knee Immobilizer: On when out of bed or walking Restrictions Weight Bearing Restrictions: Yes RLE Weight Bearing: Weight bearing as tolerated Mobility (including Balance) Bed Mobility Bed Mobility: Yes Supine to Sit: 4: Min assist;HOB elevated (Comment degrees) Supine to Sit Details (indicate cue type and reason): min A with RLE, increase time to complete task, HOB up 45* Transfers Transfers: Yes Sit to Stand: 4: Min assist;From bed Sit to Stand Details (indicate cue type and reason): cues for hand placement, min/guard for safety Stand to Sit: 4: Min assist;With armrests;To chair/3-in-1 Stand to Sit Details: min A for RLE; verbal cues for hand placement and safety Stand Pivot Transfers: 4: Min assist Stand Pivot Transfer Details (indicate cue type and reason): min/guard for safety, VCs for RW position Ambulation/Gait Ambulation/Gait: No (Pt refused further activity)    Exercise  Total Joint Exercises Ankle Circles/Pumps: Both;5 reps;AROM Quad Sets: AROM;Both  (attempted, pt with limited effort, c/o severe pain, unable t) End of Session PT - End of Session Equipment Utilized During Treatment: Right knee immobilizer Activity Tolerance: Patient limited by pain Patient left: in chair;with call bell in reach General Behavior During Session:  (pt states she is frustrated and irritated) Cognition: WFL for tasks performed  [facility] 10/27/2011, 11:06 AM

## 2011-10-27 NOTE — Clinical Documentation Improvement (Signed)
GENERIC DOCUMENTATION CLARIFICATION QUERY  Please update your documentation within the medical record to reflect your response to this query.                                                                                         10/27/11    Dear Marjory Sneddon, [facility],  I Pt admitted with severe degenerative arthritis and had a R TKA performed on 10/26/11  Based on pt' lab on 10/27/11 of H/H=7.0/24.1, please clarify the underlying diagnosis responsible for abn H/H.  Other Condition__Acute Blood Loss Anemia________________ Cannot Clinically Determine__________________     Supporting Information: Risk Factors: severe degenerative arthritis; (R) TKA  Diagnostics [facility]   10/26/11 0526    10/25/11 0425   HGB  8.0*                    7.8*   HCT  25.2*                 25.0*    10/27/11 0510       10/26/11 0526    HGB  7.7*                      8.0*   HCT  24.1*                    25.2*    Treatment: Ferrous Sulfate  You may use possible, probable, or suspect with inpatient documentation. possible, probable, suspected diagnoses MUST be documented at the time of discharge  Reviewed:  no additional documentation provided  Thank You,  Sincerely, Enis Slipper  Clinical Documentation Specialist: Pager  [phone]  Health Information Management [place]  In a better effort to capture your patient's severity of illness, reflect appropriate length of stay and utilization of resources, a review of the patient medical record has revealed the following indicators the diagnosis of Heart Failure.    Based on your clinical judgment, please clarify and document in a progress note and/or discharge summary the clinical condition associated with the following supporting information:  In responding to this query please exercise your independent judgment.  The fact that a query is asked, does not imply that any particular answer is desired or expected.

## 2011-10-28 DIAGNOSIS — M1990 Unspecified osteoarthritis, unspecified site: Secondary | ICD-10-CM

## 2011-10-28 LAB — TYPE AND SCREEN
ABO/RH(D): AB POS
Unit division: 0

## 2011-10-28 LAB — PROTIME-INR: INR: 1.41 (ref 0.00–1.49)

## 2011-10-28 MED ORDER — METHOCARBAMOL 500 MG PO TABS: 500.0000 mg | ORAL_TABLET | Freq: Four times a day (QID) | ORAL | Status: DC | PRN

## 2011-10-28 MED ORDER — FERROUS SULFATE 325 (65 FE) MG PO TABS: 325.0000 mg | ORAL_TABLET | Freq: Three times a day (TID) | ORAL | Status: DC

## 2011-10-28 MED ORDER — METHOCARBAMOL 500 MG PO TABS: 500.0000 mg | ORAL_TABLET | Freq: Four times a day (QID) | ORAL | Status: AC | PRN

## 2011-10-28 MED ORDER — WARFARIN SODIUM 7.5 MG PO TABS: 7.5000 mg | ORAL_TABLET | Freq: Once | ORAL | Status: DC

## 2011-10-28 MED ORDER — OXYCODONE-ACETAMINOPHEN 5-325 MG PO TABS: 1.0000 | ORAL_TABLET | ORAL | Status: DC | PRN

## 2011-10-28 MED ORDER — OXYCODONE-ACETAMINOPHEN 5-325 MG PO TABS: 1.0000 | ORAL_TABLET | ORAL | Status: AC | PRN

## 2011-10-28 MED ORDER — WARFARIN SODIUM 10 MG PO TABS
10.0000 mg | ORAL_TABLET | Freq: Once | ORAL | Status: DC
  Filled 2011-10-28: qty 1

## 2011-10-28 NOTE — Progress Notes (Signed)
Subjective: Doing very well this A.M.   Objective: Vital signs in last 24 hours: Temp:  [98.1 F (36.7 C)] 98.1 F (36.7 C) (11/05 1412) Pulse Rate:  [89] 89  (11/05 1412) Resp:  [18] 18  (11/05 1412) BP: (103-115)/(71-78) 115/78 mmHg (11/05 1412) SpO2:  [97 %] 97 % (11/05 1412) [name]  MRN: [id number] DOB/Age: 22-May-1966 45 y.o. Physician: [name]. [name] Procedure:       Vital Signs Temp:  [98.1 F (36.7 C)] 98.1 F (36.7 C) (11/05 1412) Pulse Rate:  [89] 89  (11/05 1412) Resp:  [18] 18  (11/05 1412) BP: (103-115)/(71-78) 115/78 mmHg (11/05 1412) SpO2:  [97 %] 97 % (11/05 1412)  Lab Results  Basename 10/28/11 0420 10/27/11 0510  WBC -- --  HGB 8.2* 7.7*  HCT 26.0* 24.1*  PLT -- --   BMET No results found for this basename: NA:2,K:2,CL:2,CO2:2,GLUCOSE:2,BUN:2,CREATININE:2,CALCIUM:2 in the last 72 hours CBG:  INR  Date Value Range Status  10/28/2011 1.41  0.00-1.49 (no units) Final    Exam Extremities:Calf tenderness:none Pulses: Intact Neurologic:Dorsiflexion Intact Incision/Wound: Normal  Anticoagulant: Coumadin  Hemovac: DCD  Post-Op X-Ray: normal  None  Plan Pt to be discharged Today Return to clinic for follow-up: 1.5 weeks after Surgery   [name] A 10/28/2011, 6:40 AM      Intake/Output from previous day: 11/05 0701 - 11/06 0700 In: 720 [P.O.:720] Out: -  Intake/Output this shift:     Basename 10/28/11 0420 10/27/11 0510 10/26/11 0526  HGB 8.2* 7.7* 8.0*    Basename 10/28/11 0420 10/27/11 0510  WBC -- --  RBC -- --  HCT 26.0* 24.1*  PLT -- --   No results found for this basename: NA:2,K:2,CL:2,CO2:2,BUN:2,CREATININE:2,GLUCOSE:2,CALCIUM:2 in the last 72 hours  Basename 10/28/11 0420 10/27/11 0510  LABPT -- --  INR 1.41 1.64*    Incision: no drainage  Assessment/Plan: DC Today   [name] A 10/28/2011, 6:36 AM

## 2011-10-28 NOTE — Progress Notes (Signed)
ANTICOAGULATION CONSULT NOTE - Follow Up Consult  Pharmacy Consult for Warfarin Indication: VTE prophylaxis s/p R TKA  No Known Allergies  Patient Measurements: Height: 5\' 6"  (167.6 cm) Weight: 254 lb 1.6 oz (115.26 kg) IBW/kg (Calculated) : 59.3    Vital Signs: Temp: 98.1 F (36.7 C) (11/06 0600) Temp src: Oral (11/06 0600) BP: 127/86 mmHg (11/06 0600) Pulse Rate: 86  (11/06 0600)  Labs:  Basename 10/28/11 0420 10/27/11 0510 10/26/11 0526  HGB 8.2* 7.7* --  HCT 26.0* 24.1* 25.2*  PLT -- -- --  APTT -- -- --  LABPROT 17.5* 19.7* 17.7*  INR 1.41 1.64* 1.43  HEPARINUNFRC -- -- --  CREATININE -- -- --  CKTOTAL -- -- --  CKMB -- -- --  TROPONINI -- -- --   Estimated Creatinine Clearance: 113.3 ml/min (by C-G formula based on Cr of 0.73).   Medications:  Scheduled:    . tenormin  50 mg Oral Daily  . docusate sodium  100 mg Oral BID  . ferrous sulfate  325 mg Oral TID PC  . heparin subcutaneous  5,000 Units Subcutaneous Q12H  . warfarin  7.5 mg Oral ONCE-1800    Assessment: INR fell this AM after pt missed last night's 7.5mg  dose. Would advise to continue at 7.5mg  daily at discharge with daily INR monitoring until achieve stable INR.  Pt also receiving heparin 5000 units SQ until INR therapeutic. No bleeding/complications reported.  Goal of Therapy:  INR 2-3   Plan:  Warfarin 10mg  po once today (to make up for missing yesterday's dose). Continue SQ heparin until INR>2 and f/u INR in AM.   [name], [name] 10/28/2011,8:26 AM

## 2011-10-28 NOTE — Progress Notes (Signed)
Pt discharged to home as ordered. No iv site noted. Dressing to r knee changed-C,D,I. Dressing supplies given for home use. No changes in am assessments today. D/C instructions & prescriptions given with verbalized understanding. Encouraged pt to use w/c to be brought to lobby by NA since she had tried to ambulate on hallway pushing her w/c to go down the elevator. Family member with pt.on d/c.

## 2011-10-28 NOTE — Progress Notes (Signed)
Physical Therapy Treatment Patient Details Name: [name] MRN: [id number] DOB: 1966-11-28 Today's Date: 10/28/2011 10:15 - 10:45 2 te PT Assessment/Plan  PT - Assessment/Plan Comments on Treatment Session: pt plans to D/C to home today Follow Up Recommendations: Home health PT Equipment Recommended: Rolling walker with 5" wheels PT Goals  Acute Rehab PT Goals PT Goal Formulation: With patient PT Goal: Sit to Supine/Side - Progress: Progressing toward goal PT Transfer Goal: Sit to Stand/Stand to Sit - Progress: Progressing toward goal PT Goal: Ambulate - Progress: Progressing toward goal  PT Treatment Precautions/Restrictions  Precautions Precautions: Knee Precaution Booklet Issued: No Precaution Comments: pt instructed to NEVER kneel on her TKR Required Braces or Orthoses: Yes Knee Immobilizer: Discontinue once straight leg raise with < 10 degree lag Restrictions Weight Bearing Restrictions: No RLE Weight Bearing: Weight bearing as tolerated Mobility (including Balance) Bed Mobility Bed Mobility: No Transfers Transfers: No Ambulation/Gait Ambulation/Gait: No Stairs: No Wheelchair Mobility Wheelchair Mobility: No    Exercise  Total Joint Exercises Ankle Circles/Pumps: AROM;Both;10 reps Quad Sets: AROM;Both;10 reps Gluteal Sets: AROM;Both;10 reps Towel Squeeze: AROM;Both;10 reps Short Arc QuadBarbaraann Lambert;Right;10 reps Heel Slides: AROM;Right;10 reps;Other (comment) (demonstarted and instruicted pt how to use a bed sheet toass) Hip ABduction/ADduction: AROM;Right;10 reps Straight Leg Raises: AAROM;Right;10 reps End of Session PT - End of Session Activity Tolerance: Patient tolerated treatment well Patient left: in bed;with call bell in reach Nurse Communication: Other (comment) (pt ready for D/C to home) General Behavior During Session: [name] for tasks performed Cognition: [name] for tasks performed  [name] 10/28/2011, 11:53 AM

## 2011-10-28 NOTE — Discharge Summary (Signed)
Walk with your walker. Weight bearing as instructed. Home Health Agency will follow you at home for your therapy and to manage your Coumadin. Change your dressing daily. Shower only, no tub bath. Call if any temperatures greater than 101 or any wound complications: [phone] during the day and ask for Dr. Jeannetta Ellis nurse, [facility]. Physician Discharge Summary  Patient ID: [name] MRN: [id number] DOB/AGE: 1966-02-13 45 y.o.  Admit date: 10/24/2011 Discharge date: 10/28/2011  Admission Diagnoses:Osteoarthritis  Discharge Diagnoses:  Principal Problem:  *OA (osteoarthritis)   Discharged Condition: stable  Hospital Course: Did well post Op,without complications  Consults: None  Significant Diagnostic Studies: radiology: X-Ray: Normal Total Knee Xrays  Treatments: antibiotics: Ancef  Discharge Exam: Blood pressure 115/78, pulse 89, temperature 98.1 F (36.7 C), temperature source Oral, resp. rate 18, height 5\' 6"  (1.676 m), weight 115.26 kg (254 lb 1.6 oz), SpO2 97.00%. Incision/Wound:No complications  Disposition:    Current Discharge Medication List    START taking these medications   Details  ferrous sulfate 325 (65 FE) MG tablet Take 1 tablet (325 mg total) by mouth 3 (three) times daily after meals. Qty: 36 tablet, Refills: 0    methocarbamol (ROBAXIN) 500 MG tablet Take 1 tablet (500 mg total) by mouth every 6 (six) hours as needed. Qty: 36 tablet, Refills: 0    oxyCODONE-acetaminophen (PERCOCET) 5-325 MG per tablet Take 1-2 tablets by mouth every 4 (four) hours as needed. Qty: 50 tablet, Refills: 0    warfarin (COUMADIN) 7.5 MG tablet Take 1 tablet (7.5 mg total) by mouth one time only at 6 PM. Qty: 36 tablet, Refills: 1      CONTINUE these medications which have NOT CHANGED   Details  atenolol (TENORMIN) 50 MG tablet Take 50 mg by mouth daily.      sodium chloride (OCEAN) 0.65 % nasal spray Place 3 sprays into the nose as needed. For congestion         STOP taking these medications     meloxicam (MOBIC) 7.5 MG tablet          Signed: Farra Nikolic A 10/28/2011, 6:58 AM

## 2011-11-17 ENCOUNTER — Ambulatory Visit: Payer: Medicare Other | Admitting: Physical Therapy

## 2011-11-18 ENCOUNTER — Ambulatory Visit: Payer: Medicare Other | Attending: Orthopedic Surgery | Admitting: Physical Therapy

## 2011-11-18 ENCOUNTER — Ambulatory Visit: Payer: Medicare Other | Admitting: Physical Therapy

## 2011-11-18 DIAGNOSIS — IMO0001 Reserved for inherently not codable concepts without codable children: Secondary | ICD-10-CM | POA: Insufficient documentation

## 2011-11-18 DIAGNOSIS — M25569 Pain in unspecified knee: Secondary | ICD-10-CM | POA: Insufficient documentation

## 2011-11-18 DIAGNOSIS — M25669 Stiffness of unspecified knee, not elsewhere classified: Secondary | ICD-10-CM | POA: Insufficient documentation

## 2011-11-20 ENCOUNTER — Encounter: Payer: Medicare Other | Admitting: Physical Therapy

## 2011-11-24 ENCOUNTER — Ambulatory Visit: Payer: Medicare Other | Attending: Orthopedic Surgery | Admitting: Physical Therapy

## 2011-11-24 DIAGNOSIS — M25669 Stiffness of unspecified knee, not elsewhere classified: Secondary | ICD-10-CM | POA: Insufficient documentation

## 2011-11-24 DIAGNOSIS — IMO0001 Reserved for inherently not codable concepts without codable children: Secondary | ICD-10-CM | POA: Insufficient documentation

## 2011-11-24 DIAGNOSIS — M25569 Pain in unspecified knee: Secondary | ICD-10-CM | POA: Insufficient documentation

## 2011-11-26 ENCOUNTER — Ambulatory Visit: Payer: Medicare Other | Admitting: Physical Therapy

## 2011-11-28 ENCOUNTER — Ambulatory Visit: Payer: Medicare Other | Admitting: Physical Therapy

## 2011-12-01 ENCOUNTER — Ambulatory Visit: Payer: Medicare Other | Admitting: Physical Therapy

## 2011-12-03 ENCOUNTER — Ambulatory Visit: Payer: Medicare Other

## 2011-12-05 ENCOUNTER — Ambulatory Visit: Payer: Medicare Other | Admitting: Physical Therapy

## 2011-12-08 ENCOUNTER — Ambulatory Visit: Payer: Medicare Other | Admitting: Physical Therapy

## 2011-12-10 ENCOUNTER — Ambulatory Visit: Payer: Medicare Other | Admitting: Physical Therapy

## 2011-12-10 ENCOUNTER — Ambulatory Visit: Payer: Medicare Other

## 2011-12-12 ENCOUNTER — Ambulatory Visit: Payer: Medicare Other | Admitting: Physical Therapy

## 2011-12-17 ENCOUNTER — Ambulatory Visit: Payer: Medicare Other

## 2011-12-19 ENCOUNTER — Ambulatory Visit: Payer: Medicare Other | Admitting: Physical Therapy

## 2011-12-22 ENCOUNTER — Ambulatory Visit: Payer: Medicare Other | Admitting: Physical Therapy

## 2011-12-24 ENCOUNTER — Ambulatory Visit: Payer: Medicare Other | Attending: Orthopedic Surgery | Admitting: Physical Therapy

## 2011-12-24 DIAGNOSIS — M25569 Pain in unspecified knee: Secondary | ICD-10-CM | POA: Diagnosis not present

## 2011-12-24 DIAGNOSIS — IMO0001 Reserved for inherently not codable concepts without codable children: Secondary | ICD-10-CM | POA: Diagnosis not present

## 2011-12-24 DIAGNOSIS — M25669 Stiffness of unspecified knee, not elsewhere classified: Secondary | ICD-10-CM | POA: Diagnosis not present

## 2011-12-26 ENCOUNTER — Encounter: Payer: Medicare Other | Admitting: Physical Therapy

## 2011-12-29 ENCOUNTER — Ambulatory Visit: Payer: Medicare Other | Admitting: Physical Therapy

## 2011-12-31 ENCOUNTER — Ambulatory Visit: Payer: Medicare Other | Admitting: Physical Therapy

## 2012-01-02 ENCOUNTER — Ambulatory Visit: Payer: Medicare Other | Admitting: Physical Therapy

## 2012-01-05 ENCOUNTER — Encounter: Payer: Medicare Other | Admitting: Physical Therapy

## 2012-01-06 ENCOUNTER — Ambulatory Visit: Payer: Medicare Other | Admitting: Physical Therapy

## 2012-01-07 ENCOUNTER — Ambulatory Visit: Payer: Medicare Other | Admitting: Physical Therapy

## 2012-01-09 ENCOUNTER — Ambulatory Visit: Payer: Medicare Other | Admitting: Physical Therapy

## 2012-01-12 ENCOUNTER — Encounter: Payer: Medicare Other | Admitting: Physical Therapy

## 2012-01-12 ENCOUNTER — Ambulatory Visit: Payer: Medicare Other | Admitting: Physical Therapy

## 2012-01-13 DIAGNOSIS — M1710 Unilateral primary osteoarthritis, unspecified knee: Secondary | ICD-10-CM | POA: Diagnosis not present

## 2012-01-14 ENCOUNTER — Ambulatory Visit: Payer: Medicare Other

## 2012-01-15 ENCOUNTER — Ambulatory Visit: Payer: Medicare Other | Admitting: Physical Therapy

## 2012-01-19 ENCOUNTER — Ambulatory Visit: Payer: Medicare Other | Admitting: Physical Therapy

## 2012-01-21 ENCOUNTER — Ambulatory Visit: Payer: Medicare Other

## 2012-01-23 ENCOUNTER — Encounter: Payer: Medicare Other | Admitting: Physical Therapy

## 2012-01-26 ENCOUNTER — Ambulatory Visit: Payer: Medicare Other | Attending: Orthopedic Surgery | Admitting: Physical Therapy

## 2012-01-26 DIAGNOSIS — IMO0001 Reserved for inherently not codable concepts without codable children: Secondary | ICD-10-CM | POA: Insufficient documentation

## 2012-01-26 DIAGNOSIS — M25669 Stiffness of unspecified knee, not elsewhere classified: Secondary | ICD-10-CM | POA: Diagnosis not present

## 2012-01-26 DIAGNOSIS — M25569 Pain in unspecified knee: Secondary | ICD-10-CM | POA: Insufficient documentation

## 2012-01-28 ENCOUNTER — Encounter: Payer: Medicare Other | Admitting: Physical Therapy

## 2012-01-30 ENCOUNTER — Ambulatory Visit: Payer: Medicare Other | Admitting: Physical Therapy

## 2012-01-30 DIAGNOSIS — M25669 Stiffness of unspecified knee, not elsewhere classified: Secondary | ICD-10-CM | POA: Diagnosis not present

## 2012-01-30 DIAGNOSIS — M25569 Pain in unspecified knee: Secondary | ICD-10-CM | POA: Diagnosis not present

## 2012-01-30 DIAGNOSIS — IMO0001 Reserved for inherently not codable concepts without codable children: Secondary | ICD-10-CM | POA: Diagnosis not present

## 2012-02-02 ENCOUNTER — Encounter: Payer: Medicare Other | Admitting: Physical Therapy

## 2012-02-04 ENCOUNTER — Ambulatory Visit: Payer: Medicare Other | Admitting: Physical Therapy

## 2012-02-04 DIAGNOSIS — M25669 Stiffness of unspecified knee, not elsewhere classified: Secondary | ICD-10-CM | POA: Diagnosis not present

## 2012-02-04 DIAGNOSIS — IMO0001 Reserved for inherently not codable concepts without codable children: Secondary | ICD-10-CM | POA: Diagnosis not present

## 2012-02-04 DIAGNOSIS — M25569 Pain in unspecified knee: Secondary | ICD-10-CM | POA: Diagnosis not present

## 2012-02-06 ENCOUNTER — Ambulatory Visit: Payer: Medicare Other | Admitting: Physical Therapy

## 2012-02-06 DIAGNOSIS — M25569 Pain in unspecified knee: Secondary | ICD-10-CM | POA: Diagnosis not present

## 2012-02-06 DIAGNOSIS — M25669 Stiffness of unspecified knee, not elsewhere classified: Secondary | ICD-10-CM | POA: Diagnosis not present

## 2012-02-06 DIAGNOSIS — IMO0001 Reserved for inherently not codable concepts without codable children: Secondary | ICD-10-CM | POA: Diagnosis not present

## 2012-02-09 ENCOUNTER — Encounter: Payer: Medicare Other | Admitting: Physical Therapy

## 2012-02-11 ENCOUNTER — Ambulatory Visit: Payer: Medicare Other

## 2012-02-11 DIAGNOSIS — IMO0001 Reserved for inherently not codable concepts without codable children: Secondary | ICD-10-CM | POA: Diagnosis not present

## 2012-02-11 DIAGNOSIS — M25569 Pain in unspecified knee: Secondary | ICD-10-CM | POA: Diagnosis not present

## 2012-02-11 DIAGNOSIS — M25669 Stiffness of unspecified knee, not elsewhere classified: Secondary | ICD-10-CM | POA: Diagnosis not present

## 2012-02-13 ENCOUNTER — Encounter: Payer: Medicare Other | Admitting: Physical Therapy

## 2012-02-24 DIAGNOSIS — M1710 Unilateral primary osteoarthritis, unspecified knee: Secondary | ICD-10-CM | POA: Diagnosis not present

## 2012-04-05 ENCOUNTER — Encounter: Payer: Self-pay | Admitting: Family Medicine

## 2012-04-06 DIAGNOSIS — M1710 Unilateral primary osteoarthritis, unspecified knee: Secondary | ICD-10-CM | POA: Diagnosis not present

## 2012-07-19 DIAGNOSIS — M19079 Primary osteoarthritis, unspecified ankle and foot: Secondary | ICD-10-CM | POA: Diagnosis not present

## 2012-07-19 DIAGNOSIS — M1710 Unilateral primary osteoarthritis, unspecified knee: Secondary | ICD-10-CM | POA: Diagnosis not present

## 2012-07-19 DIAGNOSIS — M5126 Other intervertebral disc displacement, lumbar region: Secondary | ICD-10-CM | POA: Diagnosis not present

## 2012-08-09 DIAGNOSIS — M1710 Unilateral primary osteoarthritis, unspecified knee: Secondary | ICD-10-CM | POA: Diagnosis not present

## 2012-08-10 DIAGNOSIS — M545 Low back pain: Secondary | ICD-10-CM | POA: Diagnosis not present

## 2012-08-11 ENCOUNTER — Other Ambulatory Visit: Payer: Self-pay | Admitting: Otolaryngology

## 2012-08-11 DIAGNOSIS — D497 Neoplasm of unspecified behavior of endocrine glands and other parts of nervous system: Secondary | ICD-10-CM

## 2012-08-11 DIAGNOSIS — E041 Nontoxic single thyroid nodule: Secondary | ICD-10-CM

## 2012-08-14 DIAGNOSIS — M5126 Other intervertebral disc displacement, lumbar region: Secondary | ICD-10-CM | POA: Diagnosis not present

## 2012-09-01 ENCOUNTER — Other Ambulatory Visit: Payer: Self-pay | Admitting: Orthopedic Surgery

## 2012-09-01 DIAGNOSIS — M5126 Other intervertebral disc displacement, lumbar region: Secondary | ICD-10-CM

## 2012-09-08 ENCOUNTER — Ambulatory Visit
Admission: RE | Admit: 2012-09-08 | Discharge: 2012-09-08 | Disposition: A | Discharge status: Home / Self Care | Payer: Medicare Other | Source: Ambulatory Visit | Attending: Orthopedic Surgery | Admitting: Orthopedic Surgery

## 2012-09-08 DIAGNOSIS — M5126 Other intervertebral disc displacement, lumbar region: Secondary | ICD-10-CM

## 2012-09-08 DIAGNOSIS — M47817 Spondylosis without myelopathy or radiculopathy, lumbosacral region: Secondary | ICD-10-CM | POA: Diagnosis not present

## 2012-09-08 MED ORDER — GADOBENATE DIMEGLUMINE 529 MG/ML IV SOLN
20.0000 mL | Freq: Once | INTRAVENOUS | Status: AC | PRN
  Administered 2012-09-08: 20 mL via INTRAVENOUS

## 2012-09-15 DIAGNOSIS — M461 Sacroiliitis, not elsewhere classified: Secondary | ICD-10-CM | POA: Diagnosis not present

## 2012-10-06 DIAGNOSIS — H04129 Dry eye syndrome of unspecified lacrimal gland: Secondary | ICD-10-CM | POA: Diagnosis not present

## 2012-10-06 DIAGNOSIS — H11159 Pinguecula, unspecified eye: Secondary | ICD-10-CM | POA: Diagnosis not present

## 2012-10-06 DIAGNOSIS — H43399 Other vitreous opacities, unspecified eye: Secondary | ICD-10-CM | POA: Diagnosis not present

## 2012-11-01 IMAGING — CR DG KNEE COMPLETE 4+V*L*
4 series · 4 of 4 positions shown · non-contrast
Comparison: None.

CLINICAL DATA: Fall, pain.

LEFT KNEE - COMPLETE 4+ VIEW

[t knee ap left]
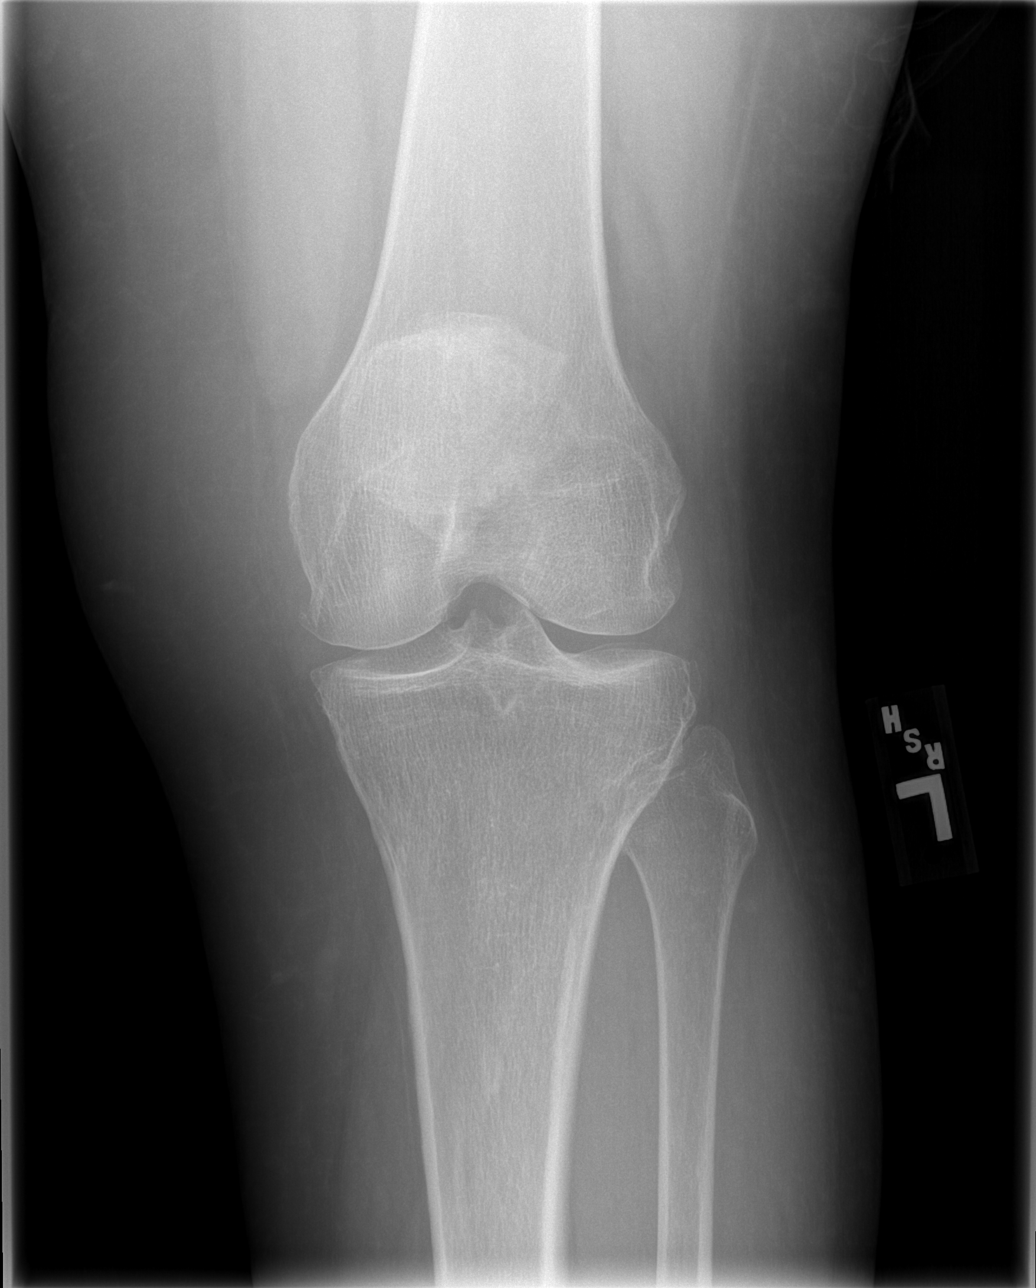

[t knee oblique left (1 of 2)]
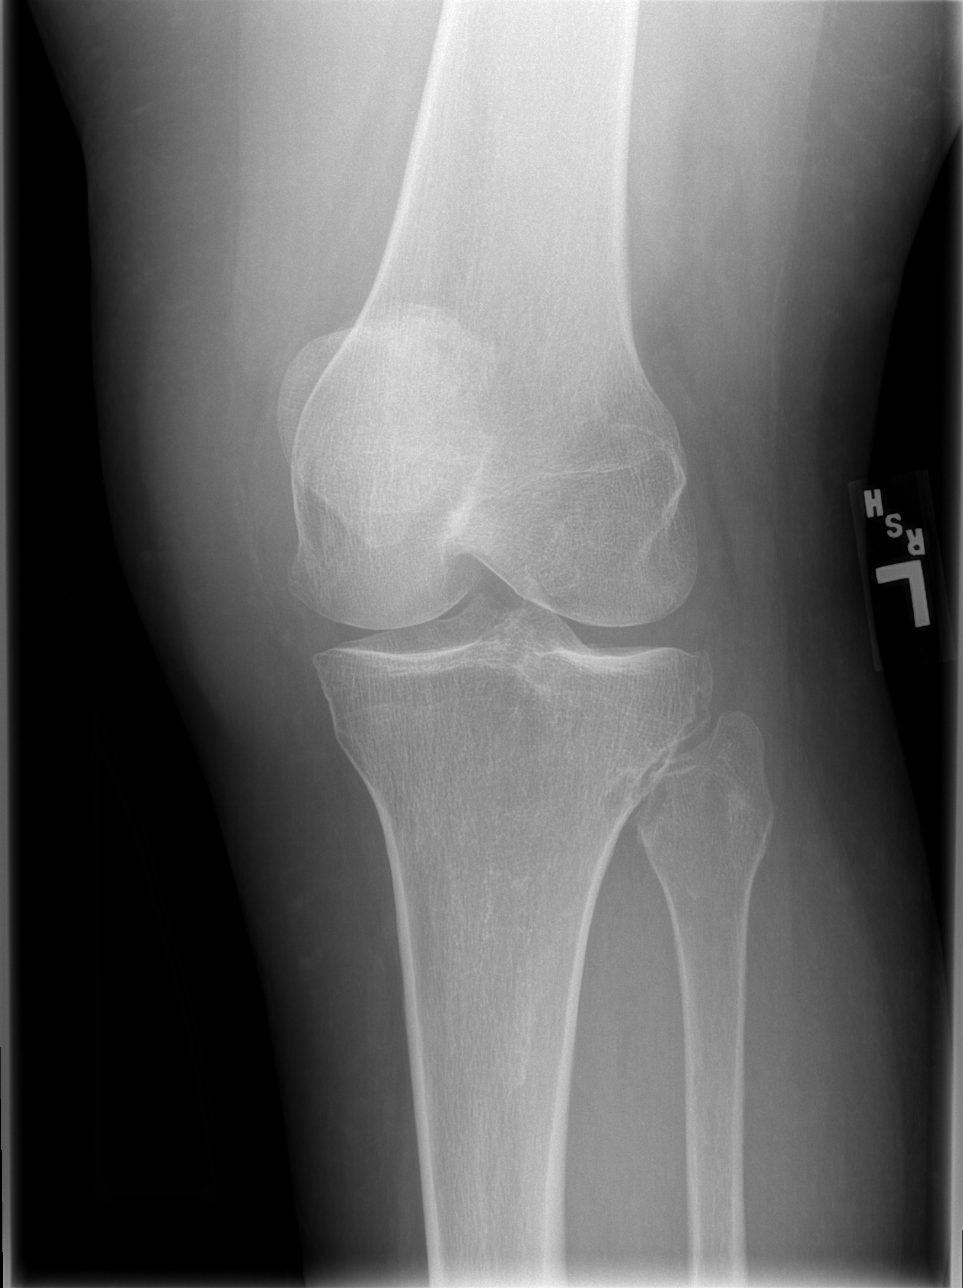

[t knee oblique left (2 of 2)]
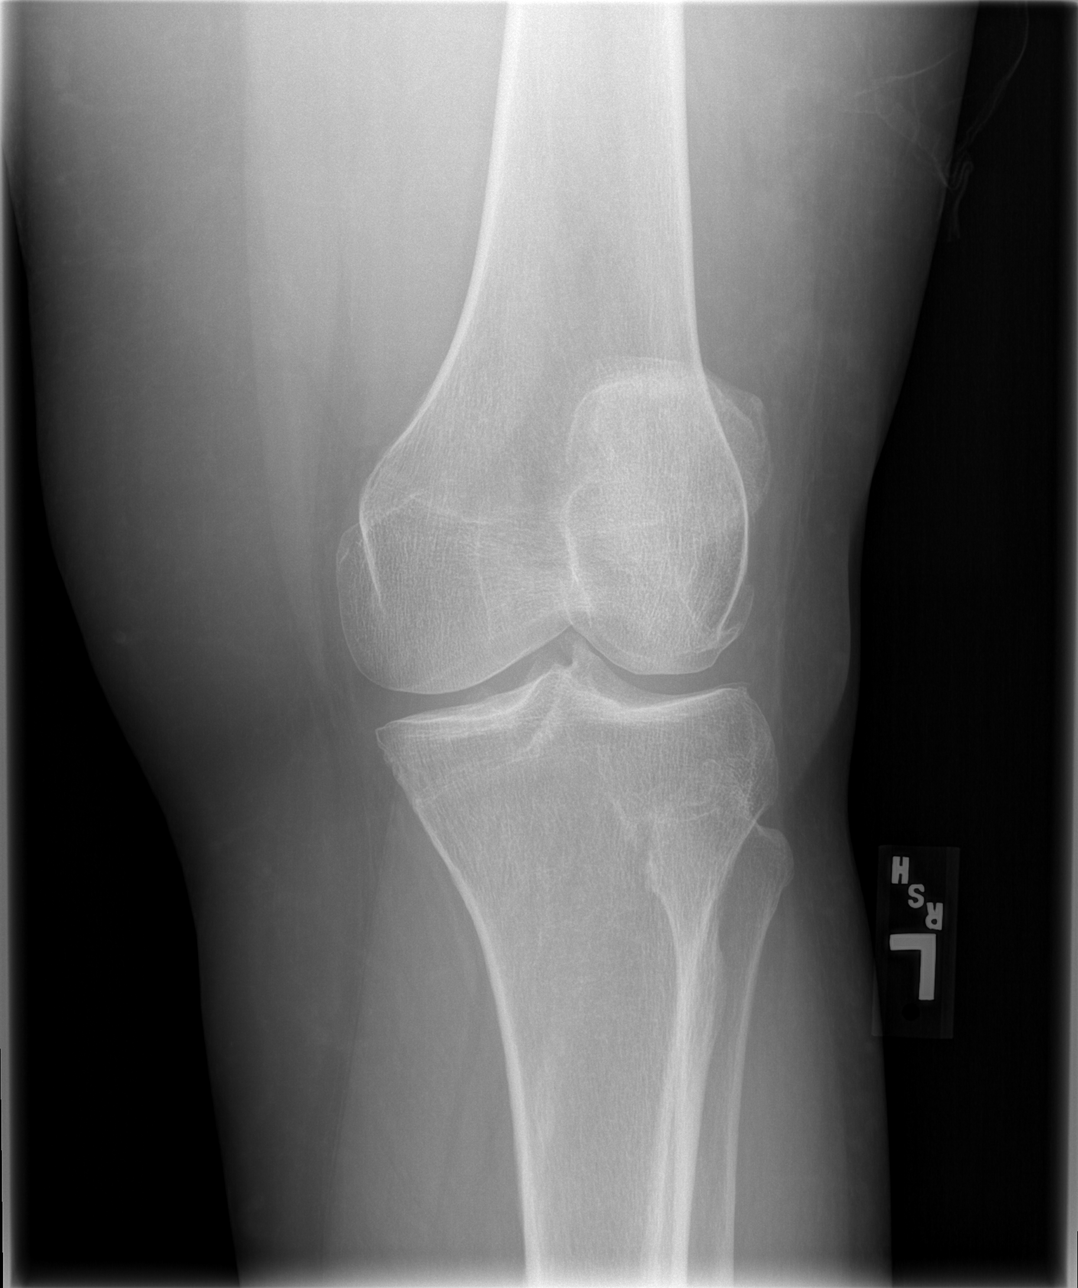

[t knee lat left]
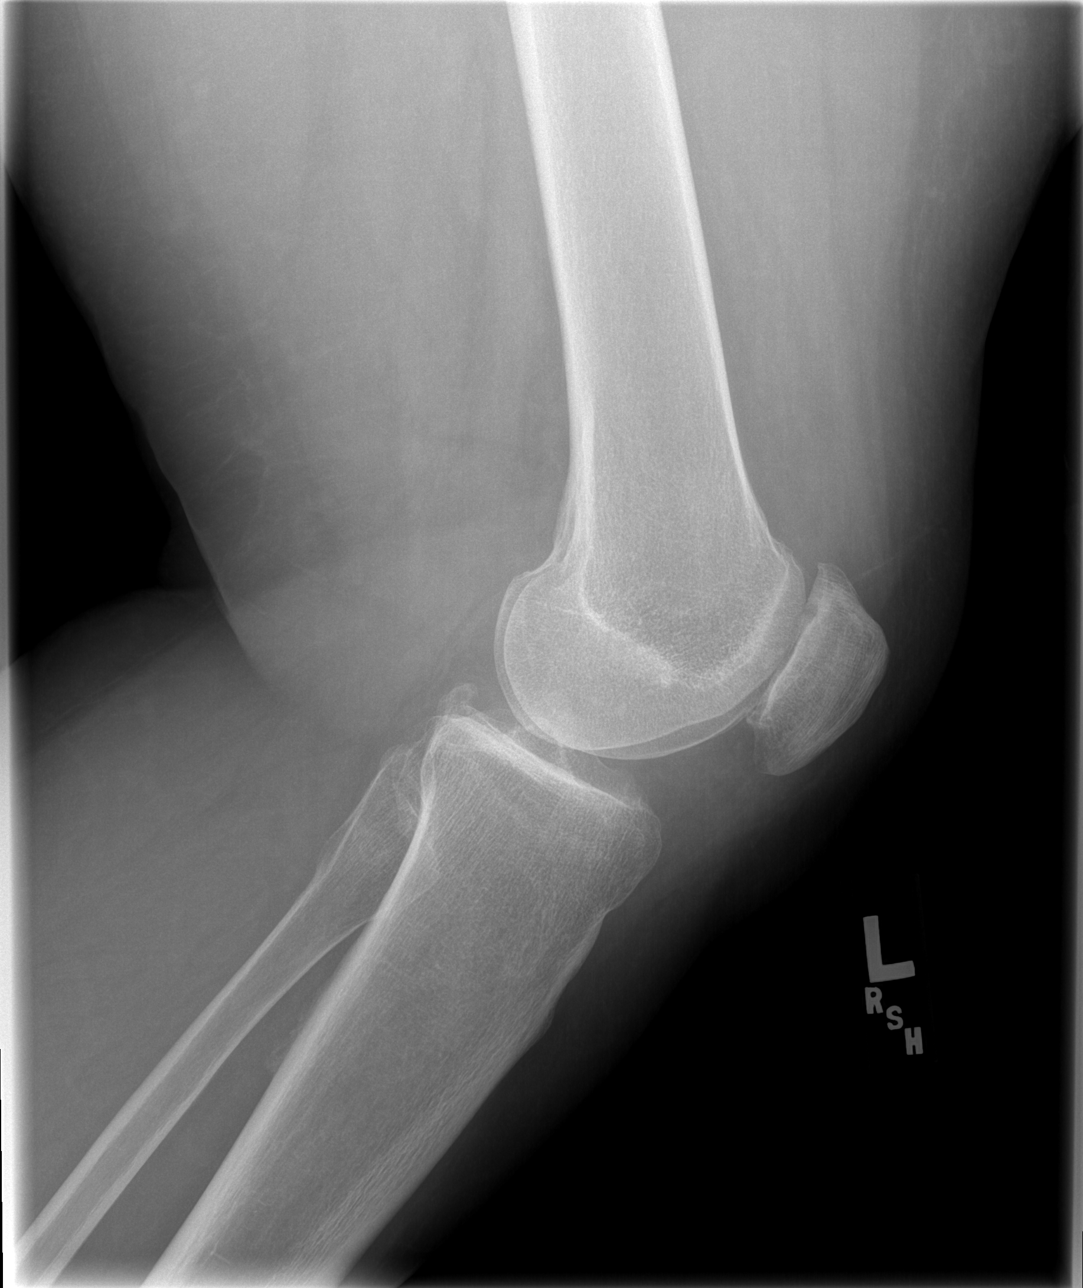

[4 of 4 positions shown; findings below may reference images not displayed]

FINDINGS: The patient has advanced for age tricompartmental
degenerative disease.  No fracture or dislocation is identified.
No joint effusion.
IMPRESSION: 1.  No acute finding.
2.  Advance for age appearing tricompartmental degenerative
disease.

## 2012-11-01 IMAGING — CR DG KNEE COMPLETE 4+V*R*
4 series · 4 of 4 positions shown · non-contrast
Comparison: None.

CLINICAL DATA: Fall, pain.

RIGHT KNEE - COMPLETE 4+ VIEW

[t knee ap right]
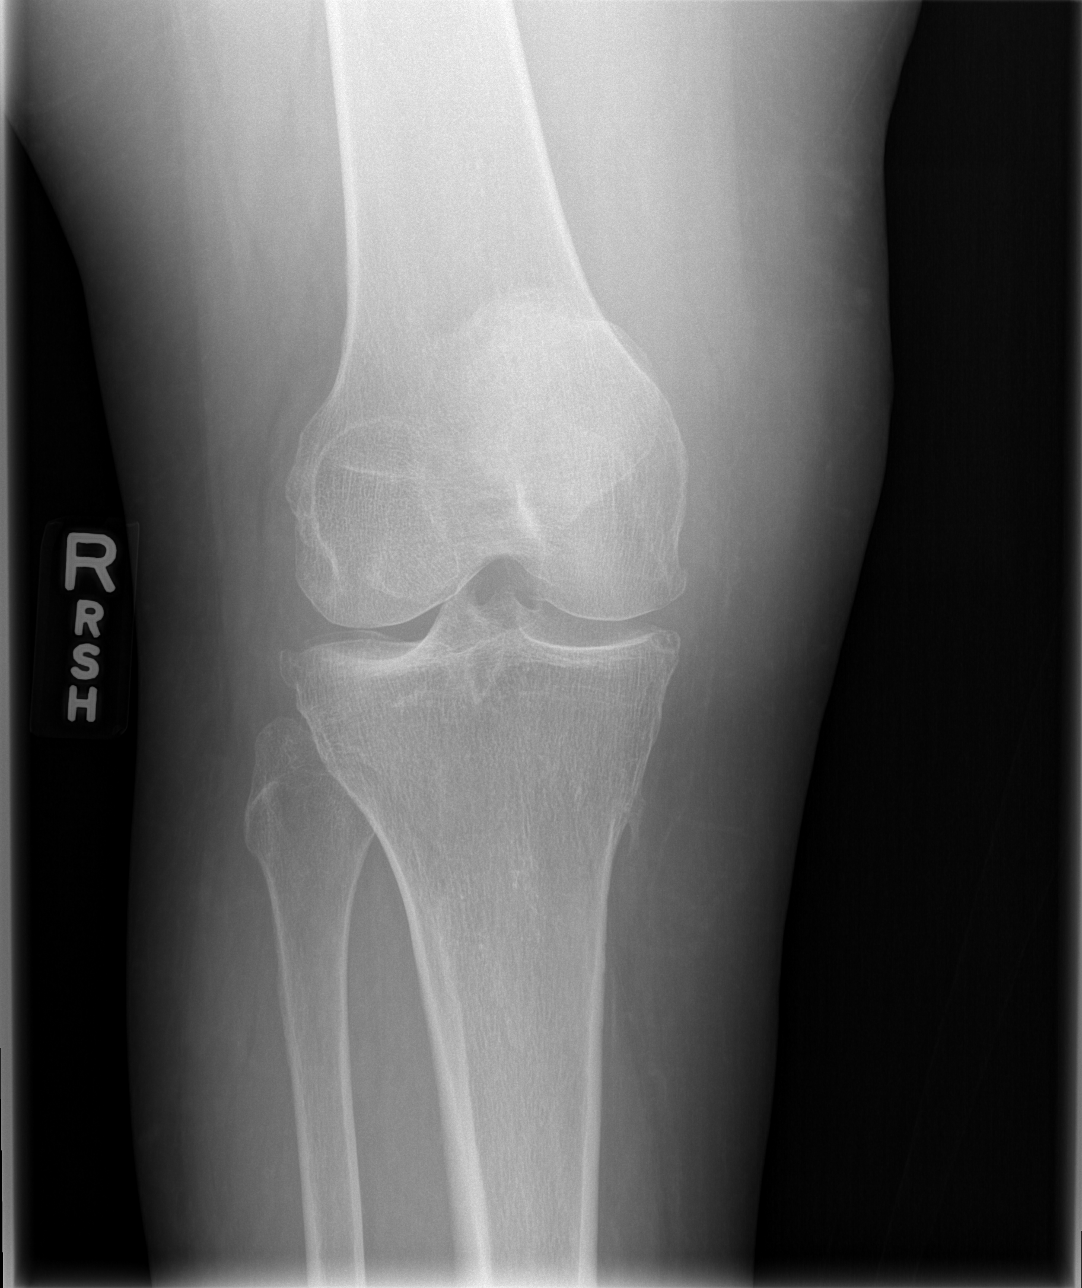

[t knee oblique right (1 of 2)]
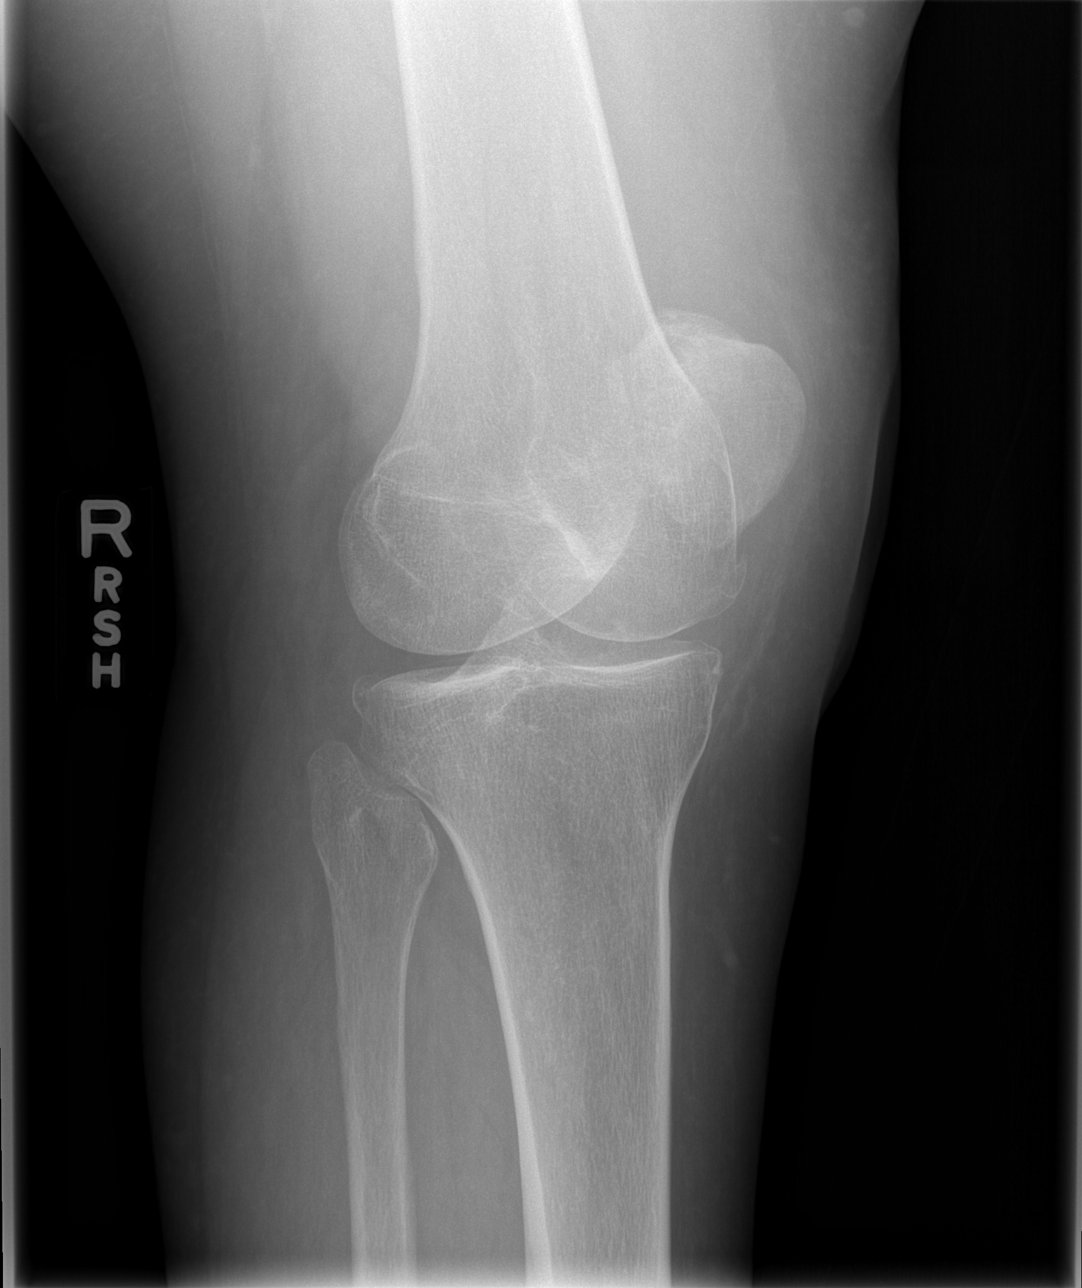

[t knee oblique right (2 of 2)]
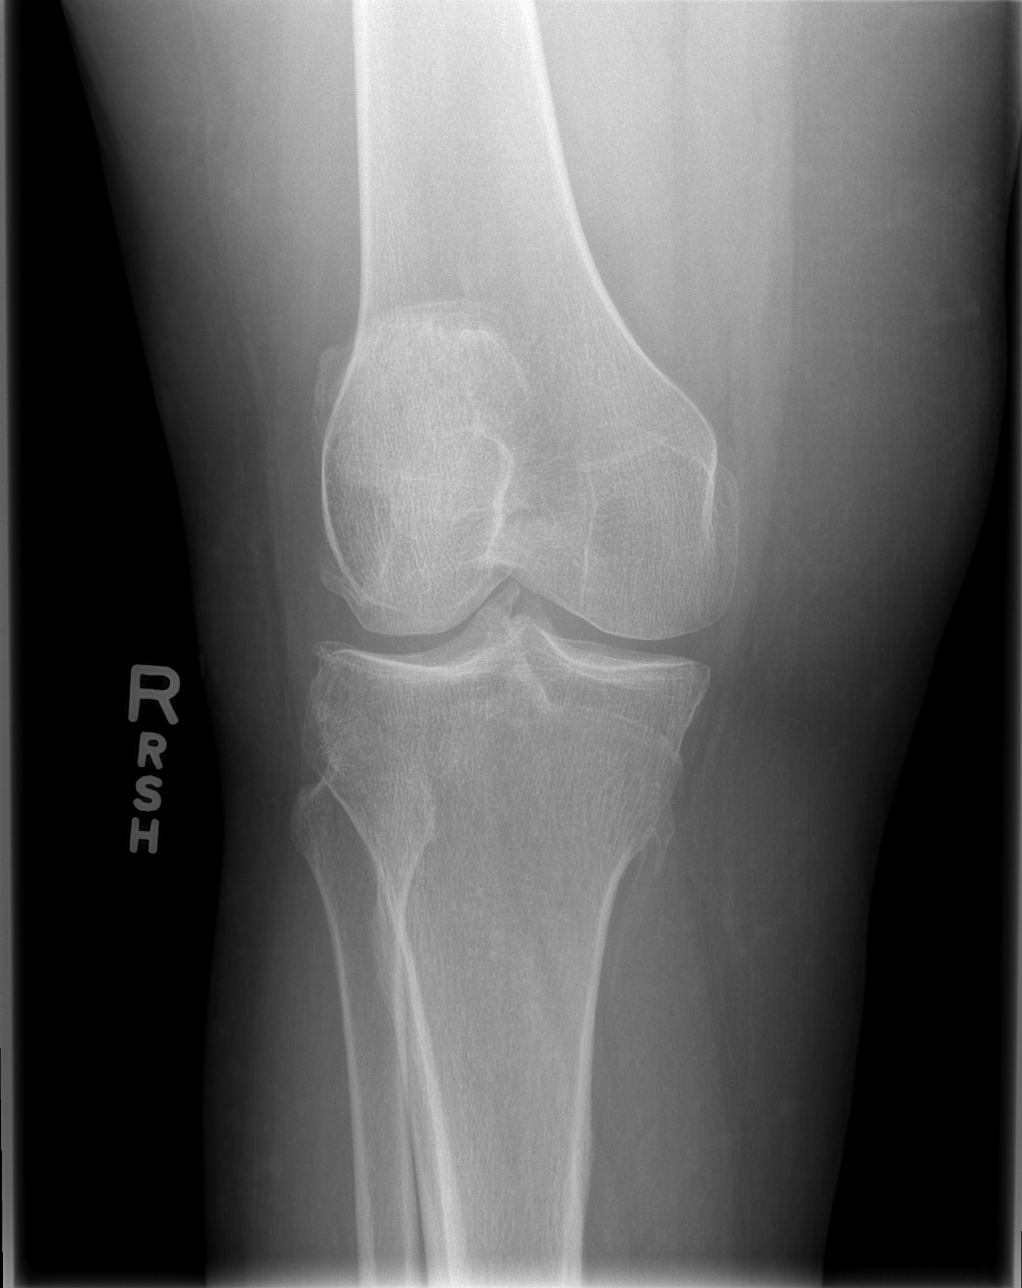

[t knee lat right]
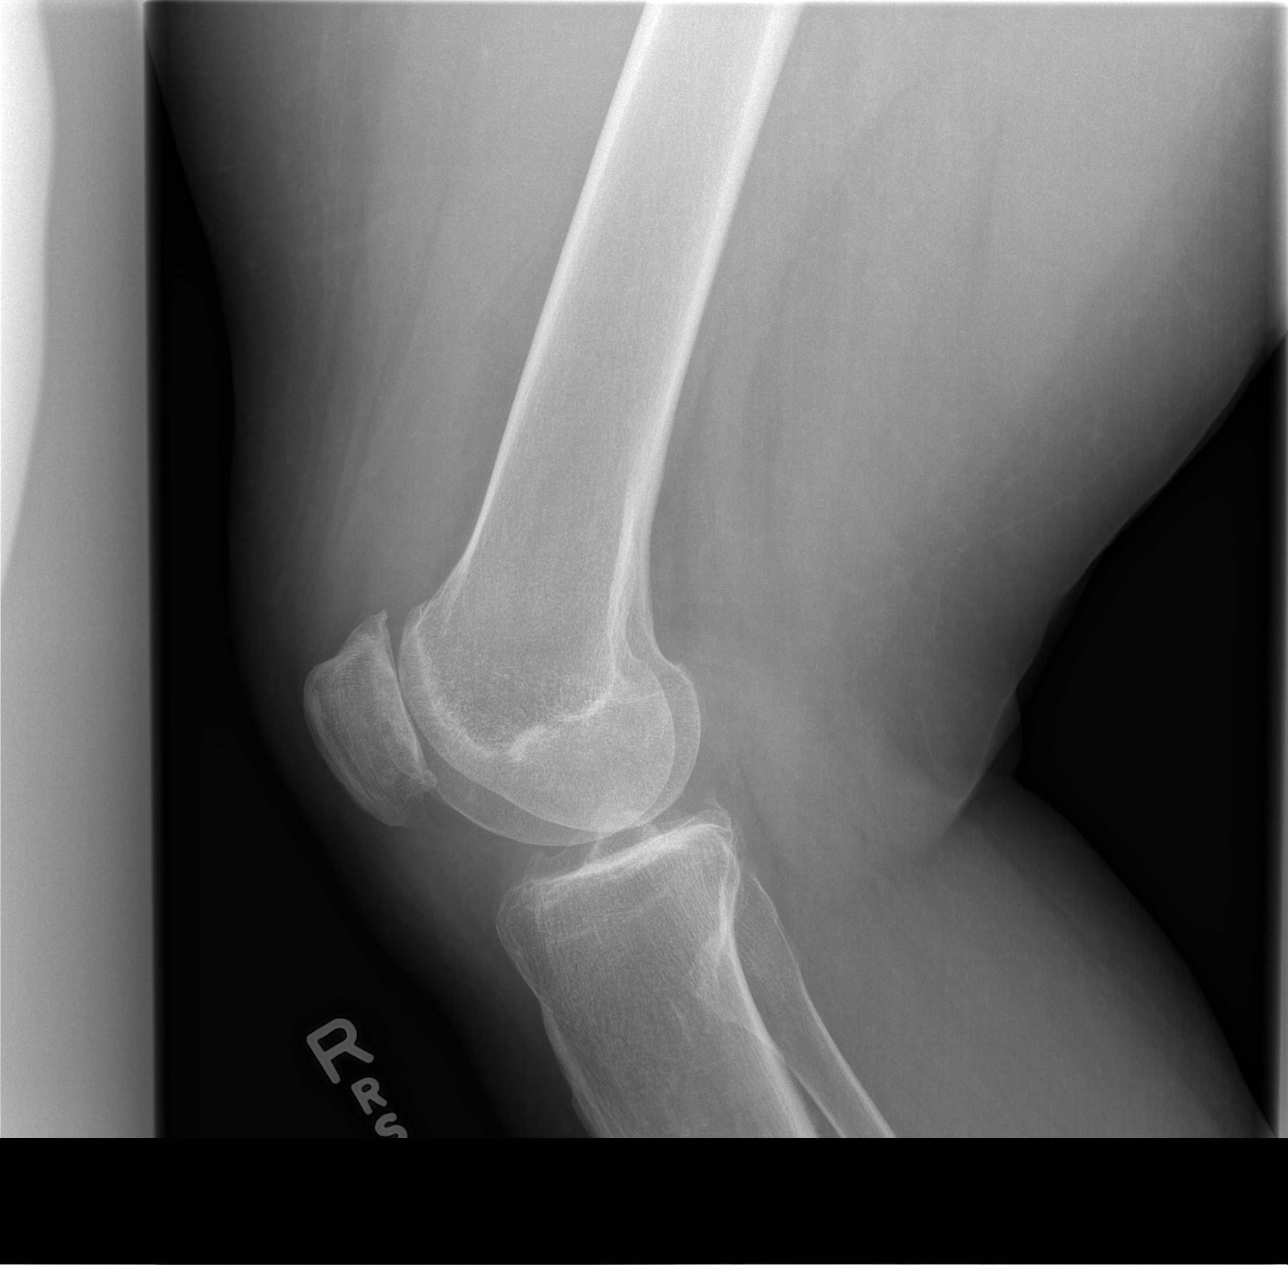

[4 of 4 positions shown; findings below may reference images not displayed]

FINDINGS: There is a small to moderate joint effusion.  No fracture
is identified.  The patient has advanced for age appearing
tricompartmental degenerative disease.  A small osteochondroma is
noted off the medial tibial metaphysis.
IMPRESSION: 1.  Negative for fracture.
2.  Small to moderate joint effusion.
3.  Tricompartmental degenerative disease.
4.  Small osteochondroma medial tibial metaphysis.

## 2012-11-04 ENCOUNTER — Ambulatory Visit (INDEPENDENT_AMBULATORY_CARE_PROVIDER_SITE_OTHER): Payer: Medicare Other | Admitting: Emergency Medicine

## 2012-11-04 VITALS — BP 126/74 | HR 76 | Temp 98.8°F | Resp 17 | Ht 67.0 in | Wt 229.0 lb

## 2012-11-04 DIAGNOSIS — N760 Acute vaginitis: Secondary | ICD-10-CM | POA: Diagnosis not present

## 2012-11-04 DIAGNOSIS — Z7251 High risk heterosexual behavior: Secondary | ICD-10-CM

## 2012-11-04 DIAGNOSIS — Z8701 Personal history of pneumonia (recurrent): Secondary | ICD-10-CM | POA: Diagnosis not present

## 2012-11-04 DIAGNOSIS — R109 Unspecified abdominal pain: Secondary | ICD-10-CM

## 2012-11-04 DIAGNOSIS — D649 Anemia, unspecified: Secondary | ICD-10-CM

## 2012-11-04 DIAGNOSIS — Z2089 Contact with and (suspected) exposure to other communicable diseases: Secondary | ICD-10-CM | POA: Diagnosis not present

## 2012-11-04 DIAGNOSIS — N898 Other specified noninflammatory disorders of vagina: Secondary | ICD-10-CM

## 2012-11-04 DIAGNOSIS — N939 Abnormal uterine and vaginal bleeding, unspecified: Secondary | ICD-10-CM

## 2012-11-04 DIAGNOSIS — Z20828 Contact with and (suspected) exposure to other viral communicable diseases: Secondary | ICD-10-CM | POA: Diagnosis not present

## 2012-11-04 DIAGNOSIS — Z202 Contact with and (suspected) exposure to infections with a predominantly sexual mode of transmission: Secondary | ICD-10-CM

## 2012-11-04 LAB — POCT CBC
Hemoglobin: 9.4 g/dL — AB (ref 12.2–16.2)
Lymph, poc: 1.8 (ref 0.6–3.4)
MCH, POC: 22.7 pg — AB (ref 27–31.2)
MCHC: 29.2 g/dL — AB (ref 31.8–35.4)
MID (cbc): 0.3 (ref 0–0.9)
MPV: 8.9 fL (ref 0–99.8)
POC Granulocyte: 2.6 (ref 2–6.9)
POC LYMPH PERCENT: 39 %L (ref 10–50)
POC MID %: 6.6 %M (ref 0–12)
Platelet Count, POC: 265 10*3/uL (ref 142–424)
RDW, POC: 22.4 %
WBC: 4.7 10*3/uL (ref 4.6–10.2)

## 2012-11-04 LAB — POCT UA - MICROSCOPIC ONLY
Casts, Ur, LPF, POC: NEGATIVE
Yeast, UA: NEGATIVE

## 2012-11-04 LAB — POCT URINALYSIS DIPSTICK
Bilirubin, UA: NEGATIVE
Nitrite, UA: NEGATIVE
pH, UA: 6

## 2012-11-04 LAB — POCT WET PREP WITH KOH
KOH Prep POC: NEGATIVE
Trichomonas, UA: NEGATIVE

## 2012-11-04 LAB — POCT URINE PREGNANCY: Preg Test, Ur: NEGATIVE

## 2012-11-04 MED ORDER — METRONIDAZOLE 500 MG PO TABS: 500.0000 mg | ORAL_TABLET | Freq: Two times a day (BID) | ORAL | Status: DC

## 2012-11-04 NOTE — Progress Notes (Signed)
  Subjective:    Patient ID: [name], female    DOB: Jul 05, 1966, 46 y.o.   MRN: [id number]  HPI A 46 year old female comes to the clinic with a symptom of abdominal pain. The pain extends around her naval region. It doesn't feel like a menstrual cycle pain. Her GYN doctor is Dr Gaynell Face. She has normal periods and regular pap smears. Her periods are normal. She feels feverish but recently has symptoms of a cold virus. She also states she sees blood when she wipes from using the bathroom. No Hx of fibroids or irregular periods.    Review of Systems     Objective:   Physical Exam there is tenderness in the periumbilical area. There is a questionable hernia in this area. There is tenderness in the suprapubic area and some deep in the left lower abdomen. There is no rebound noted. Pelvic exam reveals mild cervical erosion. There appears to be a fibroid off of the left uterine wall. There are no other masses palpable        Assessment & Plan:

## 2012-11-04 NOTE — Patient Instructions (Addendum)
Bacterial Vaginosis Bacterial vaginosis (BV) is a vaginal infection where the normal balance of bacteria in the vagina is disrupted. The normal balance is then replaced by an overgrowth of certain bacteria. There are several different kinds of bacteria that can cause BV. BV is the most common vaginal infection in women of childbearing age. CAUSES   The cause of BV is not fully understood. BV develops when there is an increase or imbalance of harmful bacteria.  Some activities or behaviors can upset the normal balance of bacteria in the vagina and put women at increased risk including:  Having a new sex partner or multiple sex partners.  Douching.  Using an intrauterine device (IUD) for contraception.  It is not clear what role sexual activity plays in the development of BV. However, women that have never had sexual intercourse are rarely infected with BV. Women do not get BV from toilet seats, bedding, swimming pools or from touching objects around them.  SYMPTOMS   Grey vaginal discharge.  A fish-like odor with discharge, especially after sexual intercourse.  Itching or burning of the vagina and vulva.  Burning or pain with urination.  Some women have no signs or symptoms at all. DIAGNOSIS  Your caregiver must examine the vagina for signs of BV. Your caregiver will perform lab tests and look at the sample of vaginal fluid through a microscope. They will look for bacteria and abnormal cells (clue cells), a pH test higher than 4.5, and a positive amine test all associated with BV.  RISKS AND COMPLICATIONS   Pelvic inflammatory disease (PID).  Infections following gynecology surgery.  Developing HIV.  Developing herpes virus. TREATMENT  Sometimes BV will clear up without treatment. However, all women with symptoms of BV should be treated to avoid complications, especially if gynecology surgery is planned. Female partners generally do not need to be treated. However, BV may spread  between female sex partners so treatment is helpful in preventing a recurrence of BV.   BV may be treated with antibiotics. The antibiotics come in either pill or vaginal cream forms. Either can be used with nonpregnant or pregnant women, but the recommended dosages differ. These antibiotics are not harmful to the baby.  BV can recur after treatment. If this happens, a second round of antibiotics will often be prescribed.  Treatment is important for pregnant women. If not treated, BV can cause a premature delivery, especially for a pregnant woman who had a premature birth in the past. All pregnant women who have symptoms of BV should be checked and treated.  For chronic reoccurrence of BV, treatment with a type of prescribed gel vaginally twice a week is helpful. HOME CARE INSTRUCTIONS   Finish all medication as directed by your caregiver.  Do not have sex until treatment is completed.  Tell your sexual partner that you have a vaginal infection. They should see their caregiver and be treated if they have problems, such as a mild rash or itching.  Practice safe sex. Use condoms. Only have 1 sex partner. PREVENTION  Basic prevention steps can help reduce the risk of upsetting the natural balance of bacteria in the vagina and developing BV:  Do not have sexual intercourse (be abstinent).  Do not douche.  Use all of the medicine prescribed for treatment of BV, even if the signs and symptoms go away.  Tell your sex partner if you have BV. That way, they can be treated, if needed, to prevent reoccurrence. SEEK MEDICAL CARE IF:     Your symptoms are not improving after 3 days of treatment.  You have increased discharge, pain, or fever. MAKE SURE YOU:   Understand these instructions.  Will watch your condition.  Will get help right away if you are not doing well or get worse. FOR MORE INFORMATION  Division of STD Prevention (DSTDP), Centers for Disease Control and Prevention:  [employer] American Social Health Association (ASHA): www.ashastd.org  Document Released: 12/08/2005 Document Revised: 03/01/2012 Document Reviewed: 05/31/2009 [facility] Patient Information 2013 [place], [place]. Take 1-2 pills daily for your anemia

## 2012-11-05 LAB — HEPATITIS C ANTIBODY: HCV Ab: NEGATIVE

## 2012-11-05 LAB — HSV(HERPES SIMPLEX VRS) I + II AB-IGG
HSV 1 Glycoprotein G Ab, IgG: 9.68 IV — ABNORMAL HIGH
HSV 2 Glycoprotein G Ab, IgG: 1.08 IV — ABNORMAL HIGH

## 2012-11-08 ENCOUNTER — Other Ambulatory Visit: Payer: Self-pay

## 2012-11-08 DIAGNOSIS — R109 Unspecified abdominal pain: Secondary | ICD-10-CM

## 2012-11-08 LAB — PAP IG, CT-NG, RFX HPV ASCU

## 2012-11-16 ENCOUNTER — Other Ambulatory Visit: Payer: Medicare Other

## 2012-11-23 ENCOUNTER — Ambulatory Visit
Admission: RE | Admit: 2012-11-23 | Discharge: 2012-11-23 | Disposition: A | Discharge status: Home / Self Care | Payer: Medicare Other | Source: Ambulatory Visit | Attending: Emergency Medicine | Admitting: Emergency Medicine

## 2012-11-23 DIAGNOSIS — R109 Unspecified abdominal pain: Secondary | ICD-10-CM

## 2012-11-23 DIAGNOSIS — N949 Unspecified condition associated with female genital organs and menstrual cycle: Secondary | ICD-10-CM | POA: Diagnosis not present

## 2012-11-23 DIAGNOSIS — N939 Abnormal uterine and vaginal bleeding, unspecified: Secondary | ICD-10-CM

## 2012-11-26 ENCOUNTER — Other Ambulatory Visit: Payer: Self-pay | Admitting: Radiology

## 2012-11-26 DIAGNOSIS — R935 Abnormal findings on diagnostic imaging of other abdominal regions, including retroperitoneum: Secondary | ICD-10-CM

## 2012-12-03 ENCOUNTER — Telehealth: Payer: Self-pay

## 2012-12-03 DIAGNOSIS — R1907 Generalized intra-abdominal and pelvic swelling, mass and lump: Secondary | ICD-10-CM | POA: Diagnosis not present

## 2012-12-03 DIAGNOSIS — R1032 Left lower quadrant pain: Secondary | ICD-10-CM | POA: Diagnosis not present

## 2012-12-03 NOTE — Telephone Encounter (Signed)
[facility] FROM OB GYN STATES WE REFERRED PT OVER DUE TO AN ABNORMAL ULTRA SOUND AND THEY NEED RECORDS/LABS AND CULTURES PLEASE FAX TO [phone] AND THE PHONE NUMBER IS [phone]    PT IS IN OFFICE NOW             PT IS WAITING

## 2012-12-03 NOTE — Telephone Encounter (Signed)
Release completed and faxed to [facility] bf

## 2012-12-23 DIAGNOSIS — N739 Female pelvic inflammatory disease, unspecified: Secondary | ICD-10-CM | POA: Diagnosis not present

## 2012-12-23 DIAGNOSIS — B373 Candidiasis of vulva and vagina: Secondary | ICD-10-CM | POA: Diagnosis not present

## 2012-12-23 DIAGNOSIS — Z113 Encounter for screening for infections with a predominantly sexual mode of transmission: Secondary | ICD-10-CM | POA: Diagnosis not present

## 2012-12-23 DIAGNOSIS — R1084 Generalized abdominal pain: Secondary | ICD-10-CM | POA: Diagnosis not present

## 2012-12-23 DIAGNOSIS — R1032 Left lower quadrant pain: Secondary | ICD-10-CM | POA: Diagnosis not present

## 2012-12-31 DIAGNOSIS — N949 Unspecified condition associated with female genital organs and menstrual cycle: Secondary | ICD-10-CM | POA: Diagnosis not present

## 2012-12-31 DIAGNOSIS — B373 Candidiasis of vulva and vagina: Secondary | ICD-10-CM | POA: Diagnosis not present

## 2012-12-31 DIAGNOSIS — D259 Leiomyoma of uterus, unspecified: Secondary | ICD-10-CM | POA: Diagnosis not present

## 2013-02-14 DIAGNOSIS — M19079 Primary osteoarthritis, unspecified ankle and foot: Secondary | ICD-10-CM | POA: Diagnosis not present

## 2013-03-09 ENCOUNTER — Ambulatory Visit (INDEPENDENT_AMBULATORY_CARE_PROVIDER_SITE_OTHER): Payer: Medicare Other | Admitting: Family Medicine

## 2013-03-09 ENCOUNTER — Encounter: Payer: Self-pay | Admitting: Family Medicine

## 2013-03-09 VITALS — BP 158/100 | HR 88 | Temp 98.1°F | Resp 16 | Ht 66.5 in | Wt 233.0 lb

## 2013-03-09 DIAGNOSIS — N760 Acute vaginitis: Secondary | ICD-10-CM

## 2013-03-09 DIAGNOSIS — D649 Anemia, unspecified: Secondary | ICD-10-CM | POA: Diagnosis not present

## 2013-03-09 DIAGNOSIS — N898 Other specified noninflammatory disorders of vagina: Secondary | ICD-10-CM | POA: Diagnosis not present

## 2013-03-09 DIAGNOSIS — D259 Leiomyoma of uterus, unspecified: Secondary | ICD-10-CM | POA: Diagnosis not present

## 2013-03-09 DIAGNOSIS — N939 Abnormal uterine and vaginal bleeding, unspecified: Secondary | ICD-10-CM

## 2013-03-09 DIAGNOSIS — B9689 Other specified bacterial agents as the cause of diseases classified elsewhere: Secondary | ICD-10-CM

## 2013-03-09 DIAGNOSIS — A499 Bacterial infection, unspecified: Secondary | ICD-10-CM | POA: Diagnosis not present

## 2013-03-09 LAB — POCT WET PREP WITH KOH
KOH Prep POC: NEGATIVE
Yeast Wet Prep HPF POC: NEGATIVE

## 2013-03-09 LAB — POCT CBC
Granulocyte percent: 57 %G (ref 37–80)
HCT, POC: 32.5 % — AB (ref 37.7–47.9)
MCH, POC: 22.6 pg — AB (ref 27–31.2)
MCV: 76.6 fL — AB (ref 80–97)
POC LYMPH PERCENT: 33.1 %L (ref 10–50)
RBC: 4.24 M/uL (ref 4.04–5.48)
RDW, POC: 21.2 %
WBC: 4.9 10*3/uL (ref 4.6–10.2)

## 2013-03-09 MED ORDER — METRONIDAZOLE 500 MG PO TABS: 500.0000 mg | ORAL_TABLET | Freq: Two times a day (BID) | ORAL | Status: DC

## 2013-03-09 NOTE — Progress Notes (Signed)
Subjective: Patient has a history of uterine fibroids. This morning she noticed vaginal blood, then some blind in a whitish mixture made for a slight reddish cream. She last had intercourse 6 days ago. She does not feel like she has a risk of STD says she's been with same man for a long time.  She does have a history of anemia, and has not been tolerant of iron. Says it makes her feel bad and have a headache.  Objective: Obese Afro-American female in no major distress. Abdomen soft. Mild left lower quadrant tenderness. Bimanual exam reveals some mild left adnexal tenderness. The speculum exam revealed a doubly white discharge with a bad odor to it. It smells like BV. Rectovaginal is negative. No evidence of any bleeding.   Results for orders placed in visit on 03/09/13  POCT WET PREP WITH KOH      Result Value Range   Trichomonas, UA Negative     Clue Cells Wet Prep HPF POC 3-5     Epithelial Wet Prep HPF POC 4-6     Yeast Wet Prep HPF POC negative     Bacteria Wet Prep HPF POC 2+     RBC Wet Prep HPF POC negative     WBC Wet Prep HPF POC 1-2     KOH Prep POC Negative    POCT CBC      Result Value Range   WBC 4.9  4.6 - 10.2 K/uL   Lymph, poc 1.6  0.6 - 3.4   POC LYMPH PERCENT 33.1  10 - 50 %L   MID (cbc) 0.5  0 - 0.9   POC MID % 9.9  0 - 12 %M   POC Granulocyte 2.8  2 - 6.9   Granulocyte percent 57.0  37 - 80 %G   RBC 4.24  4.04 - 5.48 M/uL   Hemoglobin 9.6 (*) 12.2 - 16.2 g/dL   HCT, POC 16.1 (*) 09.6 - 47.9 %   MCV 76.6 (*) 80 - 97 fL   MCH, POC 22.6 (*) 27 - 31.2 pg   MCHC 29.5 (*) 31.8 - 35.4 g/dL   RDW, POC 04.5     Platelet Count, POC 299  142 - 424 K/uL   MPV 9.1  0 - 99.8 fL   Assessment: Bacterial vaginosis Microcytic anemia, probably iron deficient  Plan: Treat the bacterial vaginosis with Flagyl twice a day Advised her to eat iron rich food intake OTC iron every other day and see if she can tolerate it. She should come back and peripheral vascular hemoglobin  recheck. She is to followup with her gynecologist as she was already planning to do.

## 2013-03-09 NOTE — Patient Instructions (Signed)
Take the metronidazole one twice daily with breakfast and supper for bacterial vaginosis.  If further problems with bleeding get rechecked  You do have a significant iron deficiency anemia. I understand that you cannot take iron well, but I would like you to try taking one iron pill every other day and see how you did with that. You probably should continue it for at least 3-4 months to rebuild your blood count. Your hemoglobin is 9.6 and it should be greater than 12.2. I think that if you would get built back up she would feel a lot better, and probably would not get other infections is easily.     Iron Deficiency Anemia There are many types of anemia. Iron deficiency anemia is the most common. Iron deficiency anemia is a decrease in the number of red blood cells caused by too little iron. Without enough iron, your body does not produce enough hemoglobin. Hemoglobin is a substance in red blood cells that carries oxygen to the body's tissues. Iron deficiency anemia may leave you tired and short of breath. CAUSES   Lack of iron in the diet.  This may be seen in infants and children, because there is little iron in milk.  This may be seen in adults who do not eat enough iron-rich foods.  This may be seen in pregnant or breastfeeding women who do not take iron supplements. There is a much higher need for iron intake at these times.  Poor absorption of iron, as seen with intestinal disorders.  Intestinal bleeding.  Heavy periods. SYMPTOMS  Mild anemia may not be noticeable. Symptoms may include:  Fatigue.  Headache.  Pale skin.  Weakness.  Shortness of breath.  Dizziness.  Cold hands and feet.  Fast or irregular heartbeat. DIAGNOSIS  Diagnosis requires a thorough evaluation and physical exam by your caregiver.  Blood tests are generally used to confirm iron deficiency anemia.  Additional tests may be done to find the underlying cause of your anemia. These may  include:  Testing for blood in the stool (fecal occult blood test).  A procedure to see inside the colon and rectum (colonoscopy).  A procedure to see inside the esophagus and stomach (endoscopy). TREATMENT   Correcting the cause of the iron deficiency is the first step.  Medicines, such as oral contraceptives, can make heavy menstrual flows lighter.  Antibiotics and other medicines can be used to treat peptic ulcers.  Surgery may be needed to remove a bleeding polyp, tumor, or fibroid.  Often, iron supplements (ferrous sulfate) are taken.  For the best iron absorption, take these supplements with an empty stomach.  You may need to take the supplements with food if you cannot tolerate them on an empty stomach. Vitamin C improves the absorption of iron. Your caregiver may recommend taking your iron tablets with a glass of orange juice or vitamin C supplement.  Milk and antacids should not be taken at the same time as iron supplements. They may interfere with the absorption of iron.  Iron supplements can cause constipation. A stool softener is often recommended.  Pregnant and breastfeeding women will need to take extra iron, because their normal diet usually will not provide the required amount.  Patients who cannot tolerate iron by mouth can take it through a vein (intravenously) or by an injection into the muscle. HOME CARE INSTRUCTIONS   Ask your dietitian for help with diet questions.  Take iron and vitamins as directed by your caregiver.  Eat a diet  rich in iron. Eat liver, lean beef, whole-grain bread, eggs, dried fruit, and dark green leafy vegetables. SEEK IMMEDIATE MEDICAL CARE IF:   You have a fainting episode. Do not drive yourself. Call your local emergency services (911 in [place] if no other help is available.  You have chest pain, nausea, or vomiting.  You develop severe or increased shortness of breath with activities.  You develop weakness or increased  thirst.  You have a rapid heartbeat.  You develop unexplained sweating or become lightheaded when getting up from a chair or bed. MAKE SURE YOU:   Understand these instructions.  Will watch your condition.  Will get help right away if you are not doing well or get worse. Document Released: 12/05/2000 Document Revised: 03/01/2012 Document Reviewed: 04/16/2010 [facility] Patient Information 2013 [place], [place].

## 2013-03-10 LAB — GC/CHLAMYDIA PROBE AMP: GC Probe RNA: NEGATIVE

## 2013-03-14 ENCOUNTER — Emergency Department (HOSPITAL_COMMUNITY)
Admission: EM | Admit: 2013-03-14 | Discharge: 2013-03-14 | Disposition: A | Discharge status: Home / Self Care | Payer: Medicare Other | Attending: Emergency Medicine | Admitting: Emergency Medicine

## 2013-03-14 ENCOUNTER — Encounter (HOSPITAL_COMMUNITY): Payer: Self-pay | Admitting: Emergency Medicine

## 2013-03-14 DIAGNOSIS — R3919 Other difficulties with micturition: Secondary | ICD-10-CM | POA: Insufficient documentation

## 2013-03-14 DIAGNOSIS — F17200 Nicotine dependence, unspecified, uncomplicated: Secondary | ICD-10-CM | POA: Insufficient documentation

## 2013-03-14 DIAGNOSIS — E669 Obesity, unspecified: Secondary | ICD-10-CM | POA: Insufficient documentation

## 2013-03-14 DIAGNOSIS — I10 Essential (primary) hypertension: Secondary | ICD-10-CM | POA: Diagnosis not present

## 2013-03-14 DIAGNOSIS — R1032 Left lower quadrant pain: Secondary | ICD-10-CM | POA: Diagnosis not present

## 2013-03-14 DIAGNOSIS — Z9851 Tubal ligation status: Secondary | ICD-10-CM | POA: Diagnosis not present

## 2013-03-14 DIAGNOSIS — Z79899 Other long term (current) drug therapy: Secondary | ICD-10-CM | POA: Insufficient documentation

## 2013-03-14 DIAGNOSIS — Z8542 Personal history of malignant neoplasm of other parts of uterus: Secondary | ICD-10-CM | POA: Insufficient documentation

## 2013-03-14 HISTORY — DX: Essential (primary) hypertension: I10

## 2013-03-14 LAB — BASIC METABOLIC PANEL
BUN: 11 mg/dL (ref 6–23)
Chloride: 105 mEq/L (ref 96–112)
GFR calc Af Amer: >90 mL/min (ref >90)
Potassium: 4.3 mEq/L (ref 3.5–5.1)

## 2013-03-14 LAB — CBC
HCT: 26.6 % — ABNORMAL LOW (ref 36.0–46.0)
Hemoglobin: 8.3 g/dL — ABNORMAL LOW (ref 12.0–15.0)
RBC: 3.54 MIL/uL — ABNORMAL LOW (ref 3.87–5.11)
WBC: 4.7 10*3/uL (ref 4.0–10.5)

## 2013-03-14 LAB — WET PREP, GENITAL
Clue Cells Wet Prep HPF POC: NONE SEEN
Trich, Wet Prep: NONE SEEN

## 2013-03-14 LAB — URINALYSIS, ROUTINE W REFLEX MICROSCOPIC
Leukocytes, UA: NEGATIVE
Nitrite: NEGATIVE
Specific Gravity, Urine: 1.033 — ABNORMAL HIGH (ref 1.005–1.030)
pH: 7 (ref 5.0–8.0)

## 2013-03-14 MED ORDER — OXYCODONE-ACETAMINOPHEN 5-325 MG PO TABS
1.0000 | ORAL_TABLET | Freq: Once | ORAL | Status: AC
  Administered 2013-03-14: 1 via ORAL
  Filled 2013-03-14: qty 1

## 2013-03-14 MED ORDER — HYDROCODONE-ACETAMINOPHEN 5-325 MG PO TABS: 1.0000 | ORAL_TABLET | Freq: Four times a day (QID) | ORAL | Status: DC | PRN

## 2013-03-14 NOTE — ED Notes (Signed)
Pt states she went to urgent care few days ago for pain in left lower abd/groin area.  Pt has uterine fibroid and was dx with bacterial vaginosis given antibiotic metronidazole that she started taking on 3/19, pt states that since Saturday she has had dark colored urine. She states that the doctor performed rectal exam and has had itching ever since.

## 2013-03-14 NOTE — ED Provider Notes (Signed)
Medical screening examination/treatment/procedure(s) were performed by non-physician practitioner and as supervising physician I was immediately available for consultation/collaboration.  Raeford Razor, MD 03/14/13 [phone]

## 2013-03-14 NOTE — ED Provider Notes (Signed)
History     CSN: [id number]  Arrival date & time 03/14/13  1137   First MD Initiated Contact with Patient 03/14/13 1152      No chief complaint on file.   (Consider location/radiation/quality/duration/timing/severity/associated sxs/prior treatment) HPI Comments: [name] is a 47 y.o. female that presents emergency department with chief complaint of dark colored urine and left lower quadrant abdominal pain.  Onset of symptoms began Saturday and have been gradually worsening.  Patient reports a history of uterine fibroids that have caused similar type pain before in the past but states that she's never experienced dark urine before.  Patient was evaluated on 3/19 at an urgent care clinic and diagnosed with bacterial vaginosis.  Patient states that she's been taking metronidazole twice a day since with her last dose being tomorrow.  Patient currently does not have an OB/GYN as she is getting her insurance straightened out, but she does have a appointment set up with [facility] on 4/22.  Patient's LMP 3/3-3/7 with normal menstruation. Abd pain onset yesterday, LLQ & suprapubic.Pain had a slow gradual onset.  Pain is described as a cramping sensation.  Exacerbating factor of palpation and no known alleviating factors.  Patient denies any fevers, night sweats, chills, change in bowel movements, syncope, chest pain, shortness of breath, leg swelling, vaginal discharge, dyspareunia or vaginal itching.  The history is provided by the patient.    Past Medical History  Diagnosis Date  . Hypertension     Past Surgical History  Procedure Laterality Date  . Ankle surgery  2009    broken right ankle   . Joint replacement    . Tubal ligation      Family History  Problem Relation Age of Onset  . Hypertension Mother   . Hypertension Father   . Cancer Maternal Grandmother     History  Substance Use Topics  . Smoking status: Current Every Day Smoker -- 0.50 packs/day for  20 years    Types: Cigarettes  . Smokeless tobacco: Not on file  . Alcohol Use: 1.0 oz/week    2 drink(s) per week     Comment: occasionally once per month    OB History   Grav Para Term Preterm Abortions TAB SAB Ect Mult Living                  Review of Systems  Allergies  Review of patient's allergies indicates no known allergies.  Home Medications   Current Outpatient Rx  Name  Route  Sig  Dispense  Refill  . metroNIDAZOLE (FLAGYL) 500 MG tablet   Oral   Take 1 tablet (500 mg total) by mouth 2 (two) times daily.   14 tablet   0   . atenolol (TENORMIN) 50 MG tablet   Oral   Take 50 mg by mouth daily.          Marland Kitchen EXPIRED: ferrous sulfate 325 (65 FE) MG tablet   Oral   Take 1 tablet (325 mg total) by mouth 3 (three) times daily after meals.   36 tablet   0   . sodium chloride (OCEAN) 0.65 % nasal spray   Nasal   Place 3 sprays into the nose as needed. For congestion            BP 145/81  Pulse 67  Temp(Src) 97.6 F (36.4 C) (Oral)  Resp 19  SpO2 100%  LMP 02/21/2013  Physical Exam  Nursing note and vitals  reviewed. Constitutional: She is oriented to person, place, and time. She appears well-developed and well-nourished. No distress.  HENT:  Head: Normocephalic and atraumatic.  Eyes: Conjunctivae and EOM are normal.  Neck: Normal range of motion.  Pulmonary/Chest: Effort normal.  Abdominal:  Soft obese abdomen with mild suprapubic tenderness to palpation and left lower quadrant tenderness to palpation.  No peritoneal sounds.  Genitourinary:  Pelvic exam chaperoned.  No cervical motion tenderness.  Mild left adnexal tenderness without masses palpated.  Normal vaginal discharge.  No malodor or or purulent draining from cervical os.  Musculoskeletal: Normal range of motion.  Neurological: She is alert and oriented to person, place, and time.  Skin: Skin is warm and dry. No rash noted. She is not diaphoretic.  Psychiatric: She has a normal mood and  affect. Her behavior is normal.    ED Course  Procedures (including critical care time)  Labs Reviewed  WET PREP, GENITAL  URINALYSIS, ROUTINE W REFLEX MICROSCOPIC  BASIC METABOLIC PANEL  CBC   No results found.   No diagnosis found.    MDM  Patient with history of uterine fibroids presents emergency department complaining of darkened urine and left lower quadrant abdominal pain.  She reports the pain is similar to past fibroid type pain.  Onset of pain was gradual and low concern for ovarian-type etiology.  Patient refused ultrasound imaging and states that she just wanted to assure her urine was okay.  Recent treatment for bacterial vaginosis will be completed tomorrow.  Patient is aware that she has STD cultures pending.  Labs reviewed with anemia of 8.3 which patient verbalizes his chronic.  Patient is to followup with OB/GYN.  Pain managed well in the emergency department.  Strict return precautions discussed and patient verbalizes understanding as she appears to be a reliable for followup.  Jaci Carrel, [place] 03/14/13 1332

## 2013-03-15 LAB — GC/CHLAMYDIA PROBE AMP
CT Probe RNA: NEGATIVE
GC Probe RNA: NEGATIVE

## 2013-03-25 DIAGNOSIS — M79609 Pain in unspecified limb: Secondary | ICD-10-CM | POA: Diagnosis not present

## 2013-03-25 DIAGNOSIS — M19079 Primary osteoarthritis, unspecified ankle and foot: Secondary | ICD-10-CM | POA: Diagnosis not present

## 2013-03-29 ENCOUNTER — Encounter (HOSPITAL_COMMUNITY): Payer: Self-pay | Admitting: Emergency Medicine

## 2013-03-29 ENCOUNTER — Emergency Department (HOSPITAL_COMMUNITY)
Admission: EM | Admit: 2013-03-29 | Discharge: 2013-03-29 | Disposition: A | Discharge status: Home / Self Care | Payer: Medicare Other | Attending: Emergency Medicine | Admitting: Emergency Medicine

## 2013-03-29 ENCOUNTER — Other Ambulatory Visit: Payer: Self-pay | Admitting: Otolaryngology

## 2013-03-29 DIAGNOSIS — R350 Frequency of micturition: Secondary | ICD-10-CM | POA: Insufficient documentation

## 2013-03-29 DIAGNOSIS — E041 Nontoxic single thyroid nodule: Secondary | ICD-10-CM | POA: Diagnosis not present

## 2013-03-29 DIAGNOSIS — M542 Cervicalgia: Secondary | ICD-10-CM | POA: Diagnosis not present

## 2013-03-29 DIAGNOSIS — R300 Dysuria: Secondary | ICD-10-CM | POA: Insufficient documentation

## 2013-03-29 DIAGNOSIS — R591 Generalized enlarged lymph nodes: Secondary | ICD-10-CM

## 2013-03-29 DIAGNOSIS — R221 Localized swelling, mass and lump, neck: Secondary | ICD-10-CM | POA: Diagnosis not present

## 2013-03-29 DIAGNOSIS — Z3202 Encounter for pregnancy test, result negative: Secondary | ICD-10-CM | POA: Diagnosis not present

## 2013-03-29 DIAGNOSIS — F17200 Nicotine dependence, unspecified, uncomplicated: Secondary | ICD-10-CM | POA: Insufficient documentation

## 2013-03-29 DIAGNOSIS — Z9851 Tubal ligation status: Secondary | ICD-10-CM | POA: Insufficient documentation

## 2013-03-29 DIAGNOSIS — R110 Nausea: Secondary | ICD-10-CM | POA: Diagnosis not present

## 2013-03-29 DIAGNOSIS — E049 Nontoxic goiter, unspecified: Secondary | ICD-10-CM | POA: Diagnosis not present

## 2013-03-29 DIAGNOSIS — R220 Localized swelling, mass and lump, head: Secondary | ICD-10-CM | POA: Diagnosis not present

## 2013-03-29 DIAGNOSIS — R109 Unspecified abdominal pain: Secondary | ICD-10-CM | POA: Insufficient documentation

## 2013-03-29 DIAGNOSIS — N390 Urinary tract infection, site not specified: Secondary | ICD-10-CM | POA: Diagnosis not present

## 2013-03-29 DIAGNOSIS — I10 Essential (primary) hypertension: Secondary | ICD-10-CM | POA: Diagnosis not present

## 2013-03-29 DIAGNOSIS — D497 Neoplasm of unspecified behavior of endocrine glands and other parts of nervous system: Secondary | ICD-10-CM | POA: Diagnosis not present

## 2013-03-29 LAB — URINALYSIS, MICROSCOPIC ONLY
Ketones, ur: 15 mg/dL — AB
Nitrite: NEGATIVE
Urobilinogen, UA: 1 mg/dL (ref 0.0–1.0)

## 2013-03-29 LAB — WET PREP, GENITAL
Clue Cells Wet Prep HPF POC: NONE SEEN
Yeast Wet Prep HPF POC: NONE SEEN

## 2013-03-29 LAB — POCT PREGNANCY, URINE: Preg Test, Ur: NEGATIVE

## 2013-03-29 MED ORDER — PHENAZOPYRIDINE HCL 100 MG PO TABS
95.0000 mg | ORAL_TABLET | Freq: Once | ORAL | Status: AC
  Administered 2013-03-29: 100 mg via ORAL
  Filled 2013-03-29: qty 1

## 2013-03-29 MED ORDER — CEPHALEXIN 500 MG PO CAPS: 500.0000 mg | ORAL_CAPSULE | Freq: Four times a day (QID) | ORAL | Status: DC

## 2013-03-29 MED ORDER — PHENAZOPYRIDINE HCL 200 MG PO TABS: 200.0000 mg | ORAL_TABLET | Freq: Three times a day (TID) | ORAL | Status: DC

## 2013-03-29 NOTE — ED Notes (Signed)
Pt sts UTI sx with burning and hematuria x 4 days; pt sts hx of same with UTI

## 2013-03-29 NOTE — ED Provider Notes (Signed)
I saw and evaluated the patient, reviewed the resident's note and I agree with the findings and plan. On my exam the patient was in no distress, sitting upright.  Gerhard Munch, MD 03/29/13 1525

## 2013-03-29 NOTE — ED Provider Notes (Signed)
History     CSN: [id number]  Arrival date & time 03/29/13  2130   First MD Initiated Contact with Patient 03/29/13 1045      Chief Complaint  Patient presents with  . Hematuria  . Urinary Tract Infection    (Consider location/radiation/quality/duration/timing/severity/associated sxs/prior treatment) Patient is a 47 y.o. female presenting with female genitourinary complaint.  Female GU Problem Primary symptoms include dysuria. There has been no fever. This is a new problem. Episode onset: 4 days ago. The problem occurs constantly. The problem has been gradually worsening. The symptoms occur during urination. She is not pregnant. She has not missed her period. Her LMP was weeks ago. Associated symptoms include abdominal pain, nausea and frequency. Pertinent negatives include no diarrhea and no vomiting. She has tried nothing ("last time they gave me percocet") for the symptoms. Sexual activity: sexually active.    Past Medical History  Diagnosis Date  . Hypertension     Past Surgical History  Procedure Laterality Date  . Ankle surgery  2009    broken right ankle   . Joint replacement    . Tubal ligation      Family History  Problem Relation Age of Onset  . Hypertension Mother   . Hypertension Father   . Cancer Maternal Grandmother     History  Substance Use Topics  . Smoking status: Current Every Day Smoker -- 0.50 packs/day for 20 years    Types: Cigarettes  . Smokeless tobacco: Not on file  . Alcohol Use: 1.0 oz/week    2 drink(s) per week     Comment: occasionally once per month    OB History   Grav Para Term Preterm Abortions TAB SAB Ect Mult Living                  Review of Systems  Constitutional: Negative for fever.  HENT: Negative for congestion.   Respiratory: Negative for cough and shortness of breath.   Cardiovascular: Negative for chest pain.  Gastrointestinal: Positive for nausea and abdominal pain. Negative for vomiting and diarrhea.   Genitourinary: Positive for dysuria, frequency and hematuria. Negative for vaginal discharge.  All other systems reviewed and are negative.    Allergies  Review of patient's allergies indicates no known allergies.  Home Medications   Current Outpatient Rx  Name  Route  Sig  Dispense  Refill  . HYDROcodone-acetaminophen (NORCO/VICODIN) 5-325 MG per tablet   Oral   Take 1 tablet by mouth every 6 (six) hours as needed for pain.   15 tablet   0   . metroNIDAZOLE (FLAGYL) 500 MG tablet   Oral   Take 1 tablet (500 mg total) by mouth 2 (two) times daily.   14 tablet   0     BP 136/90  Pulse 88  Temp(Src) 97.9 F (36.6 C) (Oral)  Resp 18  SpO2 99%  LMP 02/21/2013  Physical Exam  Nursing note and vitals reviewed. Constitutional: She is oriented to person, place, and time. She appears well-developed and well-nourished. No distress.  HENT:  Head: Normocephalic and atraumatic.  Mouth/Throat: Oropharynx is clear and moist.  Eyes: Conjunctivae are normal. Pupils are equal, round, and reactive to light. No scleral icterus.  Neck: Neck supple.  Cardiovascular: Normal rate, regular rhythm, normal heart sounds and intact distal pulses.   No murmur heard. Pulmonary/Chest: Effort normal and breath sounds normal. No stridor. No respiratory distress. She has no rales.  Abdominal: Soft. Bowel sounds are normal. She exhibits  no distension. There is no tenderness.  Genitourinary: There is no tenderness or lesion on the right labia. There is no tenderness or lesion on the left labia. Cervix exhibits no motion tenderness, no discharge and no friability. Right adnexum displays no mass and no tenderness. Left adnexum displays no mass and no tenderness.  Suprapubic and uterine tenderness on bimanual exam.   Musculoskeletal: Normal range of motion.  Neurological: She is alert and oriented to person, place, and time.  Skin: Skin is warm and dry. No rash noted.  Psychiatric: She has a normal mood  and affect. Her behavior is normal.    ED Course  Procedures (including critical care time)  Labs Reviewed  URINALYSIS, MICROSCOPIC ONLY - Abnormal; Notable for the following:    Color, Urine AMBER (*)    APPearance TURBID (*)    Hgb urine dipstick LARGE (*)    Bilirubin Urine SMALL (*)    Ketones, ur 15 (*)    Protein, ur 100 (*)    Leukocytes, UA LARGE (*)    Bacteria, UA FEW (*)    Squamous Epithelial / LPF FEW (*)    All other components within normal limits  URINE CULTURE  GC/CHLAMYDIA PROBE AMP  WET PREP, GENITAL  POCT PREGNANCY, URINE   No results found.   1. Urinary tract infection       MDM   Dysuria, frequency, and hematuria.  Suprapubic and low back pain.  Well appearing, no fevers.  UA consistent with UTI.  Pyridium for symptoms, keflex for treatment.    Also has uterine fibroids which she is managing with her gynecologist.  No need for pelvic imaging based on exam and history.       Rennis Petty, MD 03/29/13 1136

## 2013-03-30 LAB — URINE CULTURE: Colony Count: >=100000

## 2013-04-01 ENCOUNTER — Ambulatory Visit
Admission: RE | Admit: 2013-04-01 | Discharge: 2013-04-01 | Disposition: A | Discharge status: Home / Self Care | Payer: Medicare Other | Source: Ambulatory Visit | Attending: Otolaryngology | Admitting: Otolaryngology

## 2013-04-01 DIAGNOSIS — R591 Generalized enlarged lymph nodes: Secondary | ICD-10-CM

## 2013-04-01 DIAGNOSIS — E042 Nontoxic multinodular goiter: Secondary | ICD-10-CM | POA: Diagnosis not present

## 2013-04-11 ENCOUNTER — Encounter: Payer: Medicare Other | Admitting: Obstetrics & Gynecology

## 2013-04-12 ENCOUNTER — Ambulatory Visit (INDEPENDENT_AMBULATORY_CARE_PROVIDER_SITE_OTHER): Payer: Medicare Other | Admitting: Internal Medicine

## 2013-04-12 ENCOUNTER — Encounter: Payer: Self-pay | Admitting: Internal Medicine

## 2013-04-12 VITALS — BP 140/90 | HR 84 | Temp 98.6°F | Resp 18 | Ht 66.25 in | Wt 238.0 lb

## 2013-04-12 DIAGNOSIS — E0520 Thyrotoxicosis with toxic multinodular goiter without thyrotoxic crisis or storm: Secondary | ICD-10-CM | POA: Diagnosis not present

## 2013-04-12 DIAGNOSIS — I10 Essential (primary) hypertension: Secondary | ICD-10-CM | POA: Diagnosis not present

## 2013-04-12 DIAGNOSIS — Z862 Personal history of diseases of the blood and blood-forming organs and certain disorders involving the immune mechanism: Secondary | ICD-10-CM

## 2013-04-12 DIAGNOSIS — F17200 Nicotine dependence, unspecified, uncomplicated: Secondary | ICD-10-CM | POA: Diagnosis not present

## 2013-04-12 NOTE — Patient Instructions (Addendum)
Limit your sodium (Salt) intake  You need to lose weight.  Consider a lower calorie diet and regular exercise.  Smoking tobacco is very bad for your health. You should stop smoking immediately.  Take an iron supplement 2 or 3 times daily  GYN followup as scheduled  Return in one month for follow-up

## 2013-04-12 NOTE — Progress Notes (Signed)
Subjective:    Patient ID: [name], female    DOB: Jul 01, 1966, 47 y.o.   MRN: [id number]  HPI  47 year old patient who is seen today to establish with our practice. She is followed by ENT do to multinodular goiter. She has had biopsies in the past. She has a history of osteoarthritis and is status post right total knee replacement surgery. She is followed by orthopedics and is scheduled for elective surgery involving her left foot. She is scheduled for gynecologic followup tomorrow for a uterine fibroids. She has a history of hypertension but presently has been controlled off medications. She has a history of exogenous obesity.  Social history. Single; trunk grade education; gravida 3 para 2 abortus 1; states that she is disabled due 2 arthritis and thyroid disease;  Family history father age 54 history of cardiopathy hypertension and cerebrovascular disease Mother age 55 history of hypertension 2 sisters 2 brothers  Surgical history tubal ligation Right total knee replacement surgery November 2012 Surgery for a crush injury right ankle 2009  Past Medical History  Diagnosis Date  . Hypertension     History   Social History  . Marital Status: Single    Spouse Name: N/A    Number of Children: N/A  . Years of Education: N/A   Occupational History  . Not on file.   Social History Main Topics  . Smoking status: Current Every Day Smoker -- 0.50 packs/day for 20 years    Types: Cigarettes  . Smokeless tobacco: Not on file  . Alcohol Use: 1.0 oz/week    2 drink(s) per week     Comment: occasionally once per month  . Drug Use: No  . Sexually Active: Yes    Birth Control/ Protection: None   Other Topics Concern  . Not on file   Social History Narrative   Applying for disability, previously worked in [employer]    Past Surgical History  Procedure Laterality Date  . Ankle surgery  2009    broken right ankle   . Joint replacement    . Tubal ligation      Family  History  Problem Relation Age of Onset  . Hypertension Mother   . Hypertension Father   . Cancer Maternal Grandmother     No Known Allergies  No current outpatient prescriptions on file prior to visit.   No current facility-administered medications on file prior to visit.    BP 140/90  Pulse 84  Temp(Src) 98.6 F (37 C) (Oral)  Resp 18  Ht 5' 6.25" (1.683 m)  Wt 238 lb (107.956 kg)  BMI 38.11 kg/m2  LMP 02/21/2013       Review of Systems  Constitutional: Negative for fever, appetite change, fatigue and unexpected weight change.  HENT: Negative for hearing loss, ear pain, nosebleeds, congestion, sore throat, mouth sores, trouble swallowing, neck stiffness, dental problem, voice change, sinus pressure and tinnitus.   Eyes: Negative for photophobia, pain, redness and visual disturbance.  Respiratory: Negative for cough, chest tightness and shortness of breath.   Cardiovascular: Negative for chest pain, palpitations and leg swelling.  Gastrointestinal: Negative for nausea, vomiting, abdominal pain, diarrhea, constipation, blood in stool, abdominal distention and rectal pain.  Genitourinary: Negative for dysuria, urgency, frequency, hematuria, flank pain, vaginal bleeding, vaginal discharge, difficulty urinating, genital sores, vaginal pain, menstrual problem and pelvic pain.  Musculoskeletal: Positive for gait problem. Negative for back pain and arthralgias.       Left foot pain  Skin: Negative  for rash.  Neurological: Negative for dizziness, syncope, speech difficulty, weakness, light-headedness, numbness and headaches.  Hematological: Negative for adenopathy. Does not bruise/bleed easily.  Psychiatric/Behavioral: Negative for suicidal ideas, behavioral problems, self-injury, dysphoric mood and agitation. The patient is not nervous/anxious.        Objective:   Physical Exam  Constitutional: She is oriented to person, place, and time. She appears well-developed and  well-nourished.  Obese Blood pressure 140/82  HENT:  Head: Normocephalic.  Right Ear: External ear normal.  Left Ear: External ear normal.  Mouth/Throat: Oropharynx is clear and moist.  Eyes: Conjunctivae and EOM are normal. Pupils are equal, round, and reactive to light.  Neck: Normal range of motion. Neck supple. Thyromegaly present.  Thyroid enlargement with prominent left lobe  Cardiovascular: Normal rate, regular rhythm, normal heart sounds and intact distal pulses.   Right dorsalis pedis pulse not easily palpable  Pulmonary/Chest: Effort normal and breath sounds normal.  Abdominal: Soft. Bowel sounds are normal. She exhibits no mass. There is no tenderness.  Musculoskeletal: Normal range of motion.  Lymphadenopathy:    She has no cervical adenopathy.  Neurological: She is alert and oriented to person, place, and time.  Subjective numbness distal to the right knee  Skin: Skin is warm and dry. No rash noted.  Psychiatric: She has a normal mood and affect. Her behavior is normal.          Assessment & Plan:   Preventive health examination Mod severe anemia History tobacco use Exogenous obesity Uterine fibroids Multinodular goiter  Iron supplementation encouraged;  we'll check a CBC in 3 weeks. GYN followup as scheduled Weight loss encouraged Home blood pressure monitoring encouraged Low-salt diet recommended

## 2013-04-30 DIAGNOSIS — M86649 Other chronic osteomyelitis, unspecified hand: Secondary | ICD-10-CM | POA: Diagnosis not present

## 2013-05-09 ENCOUNTER — Encounter: Payer: Self-pay | Admitting: Gastroenterology

## 2013-05-09 ENCOUNTER — Ambulatory Visit (INDEPENDENT_AMBULATORY_CARE_PROVIDER_SITE_OTHER): Payer: Medicare Other | Admitting: Obstetrics & Gynecology

## 2013-05-09 ENCOUNTER — Encounter: Payer: Self-pay | Admitting: Obstetrics & Gynecology

## 2013-05-09 VITALS — BP 136/93 | HR 81 | Temp 98.9°F | Ht 66.0 in | Wt 239.3 lb

## 2013-05-09 DIAGNOSIS — R109 Unspecified abdominal pain: Secondary | ICD-10-CM | POA: Diagnosis not present

## 2013-05-09 DIAGNOSIS — D259 Leiomyoma of uterus, unspecified: Secondary | ICD-10-CM | POA: Diagnosis not present

## 2013-05-09 LAB — POCT PREGNANCY, URINE: Preg Test, Ur: NEGATIVE

## 2013-05-09 NOTE — Patient Instructions (Addendum)
Abdominal Pain  Abdominal pain can be caused by many things. Your caregiver decides the seriousness of your pain by an examination and possibly blood tests and X-rays. Many cases can be observed and treated at home. Most abdominal pain is not caused by a disease and will probably improve without treatment. However, in many cases, more time must pass before a clear cause of the pain can be found. Before that point, it may not be known if you need more testing, or if hospitalization or surgery is needed.  HOME CARE INSTRUCTIONS   · Do not take laxatives unless directed by your caregiver.  · Take pain medicine only as directed by your caregiver.  · Only take over-the-counter or prescription medicines for pain, discomfort, or fever as directed by your caregiver.  · Try a clear liquid diet (broth, tea, or water) for as long as directed by your caregiver. Slowly move to a bland diet as tolerated.  SEEK IMMEDIATE MEDICAL CARE IF:   · The pain does not go away.  · You have a fever.  · You keep throwing up (vomiting).  · The pain is felt only in portions of the abdomen. Pain in the right side could possibly be appendicitis. In an adult, pain in the left lower portion of the abdomen could be colitis or diverticulitis.  · You pass bloody or black tarry stools.  MAKE SURE YOU:   · Understand these instructions.  · Will watch your condition.  · Will get help right away if you are not doing well or get worse.  Document Released: 09/17/2005 Document Revised: 03/01/2012 Document Reviewed: 07/26/2008  Exit[employer]® Patient Information ©2013 [employer].

## 2013-05-09 NOTE — Progress Notes (Signed)
Patient ID: [name], female   DOB: 1966/10/27, 47 y.o.   MRN: [id number]  Chief Complaint  Patient presents with  . Fibroids  . Pelvic Pain  . Surgical consult    HPI [name] is a 47 y.o. female.  Patient of Dr. Ernestina Penna, was previously evaluated for surgery by her because of lower abdominal pain and fibroid uterus. She came here because of insurance problem. Menses are regular, 3 days, slight cramping which is different from the intermittent  rlq and llq pain she gets, associated with nausea and emesis, no constipation or diarrhea. HPI  Past Medical History  Diagnosis Date  . Hypertension   . Anemia   . Fibroids   . Thyroid goiter     Past Surgical History  Procedure Laterality Date  . Ankle surgery  2009    broken right ankle   . Tubal ligation    . Joint replacement  2012    Knee    Family History  Problem Relation Age of Onset  . Hypertension Mother   . Hypertension Father   . Cancer Maternal Grandmother     Social History History  Substance Use Topics  . Smoking status: Current Every Day Smoker -- 0.50 packs/day for 20 years    Types: Cigarettes  . Smokeless tobacco: Never Used  . Alcohol Use: 1.0 oz/week    2 drink(s) per week     Comment: occasionally once per month    No Known Allergies  Current Outpatient Prescriptions  Medication Sig Dispense Refill  . ferrous sulfate 325 (65 FE) MG tablet Take 325 mg by mouth daily with breakfast.       No current facility-administered medications for this visit.    Review of Systems Review of Systems  Constitutional: Negative for fever and unexpected weight change.  Gastrointestinal: Positive for nausea, vomiting and abdominal pain. Negative for diarrhea, constipation and abdominal distention.  Genitourinary: Negative for dysuria, frequency, vaginal bleeding, vaginal discharge and menstrual problem.    Blood pressure 136/93, pulse 81, temperature 98.9 F (37.2 C), height 5\' 6"  (1.676 m),  weight 239 lb 4.8 oz (108.546 kg), last menstrual period 03/28/2013.  Physical Exam Physical Exam  Constitutional: She is oriented to person, place, and time. She appears well-developed. No distress.  Abdominal: She exhibits no mass. There is tenderness (mild bilateral lowere abd). There is no rebound and no guarding.  Genitourinary: Vagina normal and uterus normal. No vaginal discharge found.  No mass  Neurological: She is alert and oriented to person, place, and time.  Skin: Skin is warm and dry.  Psychiatric: She has a normal mood and affect. Her behavior is normal.    Data Reviewed *RADIOLOGY REPORT*  Clinical Data: Dysfunctional uterine bleeding, pelvic pain  TRANSABDOMINAL AND TRANSVAGINAL ULTRASOUND OF PELVIS  Technique: Both transabdominal and transvaginal ultrasound  examinations of the pelvis were performed including evaluation of  the uterus, ovaries, adnexal regions, and pelvic cul-de-sac.  Comparison: MR pelvis of 09/08/2012  Findings:  Uterus: The uterus is retroverted measuring 10.6 cm sagittally with  a depth of 7.6 cm and width of 8.1 cm.  Endometrium:The endometrium is normal measuring 4.8 mm in thickness  Right Ovary :. The right ovary is normal in size measuring 3.7 x  2.3 x 2.4 cm. There is a small cyst of 1.7 cm present.  Left Ovary :The left ovary is normal measuring 2.8 x 1.4 x 1.8 cm.  Adjacent to the left ovary there is a solid appearing  structure  with a small amount of free fluid. This rounded structure measures  2.0 x 1.3 x 1.5 cm. This could represent a pedunculated fibroid or  could be ovarian in origin. This does appear to have been present  on the prior MRI pelvis and is unchanged.  Other Findings: There is a small amount of free fluid present.  IMPRESSION:  1. No uterine abnormality. The endometrium is normal in  thickness.  2. Possible pedunculated fibroid extending toward the left adnexa  with a small amount of fluid adjacent. It is difficult  to exclude  an ovarian soft tissue mass however on follow-up ultrasound is  recommended in 2 months.  Original Report Authenticated By: Dwyane Dee, M.D.    Assessment    Small adnexal mass likely fibroid, no menstrual problems. Abdominal pain with associated GI sx     Plan    GI referral, RTC after eval.           Zannie Runkle 05/09/2013, 4:23 PM

## 2013-05-09 NOTE — Progress Notes (Signed)
Here because has fibroids and states they are starting to cause her pain and she wants them removed. Has periods monthly that last 3-4 days.

## 2013-05-10 ENCOUNTER — Ambulatory Visit: Payer: Medicare Other | Admitting: Internal Medicine

## 2013-05-20 ENCOUNTER — Ambulatory Visit: Payer: Medicare Other | Admitting: Internal Medicine

## 2013-05-24 ENCOUNTER — Other Ambulatory Visit (INDEPENDENT_AMBULATORY_CARE_PROVIDER_SITE_OTHER): Payer: Medicare Other

## 2013-05-24 ENCOUNTER — Ambulatory Visit (INDEPENDENT_AMBULATORY_CARE_PROVIDER_SITE_OTHER): Payer: Medicare Other | Admitting: Gastroenterology

## 2013-05-24 ENCOUNTER — Encounter: Payer: Self-pay | Admitting: Gastroenterology

## 2013-05-24 ENCOUNTER — Telehealth: Payer: Self-pay | Admitting: *Deleted

## 2013-05-24 VITALS — BP 144/102 | HR 87 | Ht 66.5 in | Wt 229.4 lb

## 2013-05-24 DIAGNOSIS — IMO0001 Reserved for inherently not codable concepts without codable children: Secondary | ICD-10-CM

## 2013-05-24 DIAGNOSIS — R6889 Other general symptoms and signs: Secondary | ICD-10-CM | POA: Diagnosis not present

## 2013-05-24 DIAGNOSIS — K921 Melena: Secondary | ICD-10-CM

## 2013-05-24 DIAGNOSIS — R109 Unspecified abdominal pain: Secondary | ICD-10-CM | POA: Diagnosis not present

## 2013-05-24 DIAGNOSIS — R110 Nausea: Secondary | ICD-10-CM | POA: Diagnosis not present

## 2013-05-24 DIAGNOSIS — D649 Anemia, unspecified: Secondary | ICD-10-CM | POA: Diagnosis not present

## 2013-05-24 DIAGNOSIS — D509 Iron deficiency anemia, unspecified: Secondary | ICD-10-CM | POA: Diagnosis not present

## 2013-05-24 DIAGNOSIS — R1032 Left lower quadrant pain: Secondary | ICD-10-CM

## 2013-05-24 DIAGNOSIS — D259 Leiomyoma of uterus, unspecified: Secondary | ICD-10-CM

## 2013-05-24 DIAGNOSIS — R634 Abnormal weight loss: Secondary | ICD-10-CM | POA: Diagnosis not present

## 2013-05-24 LAB — FERRITIN: Ferritin: 15.7 ng/mL (ref 10.0–291.0)

## 2013-05-24 LAB — FOLATE: Folate: 16 ng/mL (ref >5.9)

## 2013-05-24 MED ORDER — NA SULFATE-K SULFATE-MG SULF 17.5-3.13-1.6 GM/177ML PO SOLN: ORAL | Status: DC

## 2013-05-24 MED ORDER — TRAMADOL HCL 50 MG PO TABS: 50.0000 mg | ORAL_TABLET | Freq: Four times a day (QID) | ORAL | Status: DC | PRN

## 2013-05-24 NOTE — Progress Notes (Signed)
History of Present Illness:  This is a 47 year old African American female with several months of  lower abdominal pain without real precipitating or alleviating elements.  She does have uterine fibroids, and has menorrhagia with associated iron deficiency anemia.  She been unable to tolerate by mouth iron.  She denies any gastrointestinal symptoms such as constipation, rectal bleeding, dyspepsia, or acid reflux.  She has not had previous colonoscopy or endoscopy.  She had CT scan of the abdomen in 2011 which was unremarkable except for uterine fibroids, she's had recent pelvic ultrasound and gynecologic exam.  She complains of mild anorexia with a 10 pound weight loss.  Family history is noncontributory.  She denies a specific food intolerances.  Patient also denies abuse of alcohol, cigarettes, or NSAIDs.  I have reviewed this patient's present history, medical and surgical past history, allergies and medications.     ROS:   All systems were reviewed and are negative unless otherwise stated in the HPI.    Physical Exam: General well developed well nourished patient in no acute distress, appearing their stated age Eyes PERRLA, no icterus, fundoscopic exam per opthamologist Skin no lesions noted Neck supple, no adenopathy, no thyroid enlargement, no tenderness Chest clear to percussion and auscultation Heart no significant murmurs, gallops or rubs noted Abdomen no hepatosplenomegaly masses or tenderness, BS normal.  Rectal inspection normal no fissures, or fistulae noted.  No masses or tenderness on digital exam. Stool guaiac ++ Extremities no acute joint lesions, edema, phlebitis or evidence of cellulitis. Neurologic patient oriented x 3, cranial nerves intact, no focal neurologic deficits noted. Psychological mental status normal and normal affect.  Assessment and plan: Lower abdominal pain and slight tenderness with associated iron deficiency anemia and guaiac positive stools, rule out colon  carcinoma, inflammatory bowel disease, or extrinsic sigmoid colon impression from pelvic process.  I have ordered serum iron levels, and she may need an IV iron infusion.  Colonoscopy is been scheduled as soon as possible.  And on this result she may need further abdominal scanning of her abdominal and pelvic areas.  We'll try a new milder iron preparation to see how this is tolerated by mouth No diagnosis found.

## 2013-05-24 NOTE — Addendum Note (Signed)
Addended by: Ok Anis A on: 05/24/2013 12:15 PM   Modules accepted: Orders

## 2013-05-24 NOTE — Patient Instructions (Addendum)
  You have been given a separate informational sheet regarding your tobacco use, the importance of quitting and local resources to help you quit.  You have been scheduled for a colonoscopy with propofol. Please follow written instructions given to you at your visit today.  Please pick up your prep kit at the pharmacy within the next 1-3 days. If you use inhalers (even only as needed), please bring them with you on the day of your procedure. Your physician has requested that you go to www.startemmi.com and enter the access code given to you at your visit today. This web site gives a general overview about your procedure. However, you should still follow specific instructions given to you by our office regarding your preparation for the procedure.  Your physician has requested that you go to the basement for the following lab work before leaving today: Anemia Panel   We have given you samples of the following medication to take: Integra(Iron), please take one capsule by mouth once daily.  We have sent the following medications to your pharmacy for you to pick up at your convenience: Tramadol 50 mg, please take one tablet by mouth every 6-8 hours as needed for pain. _________________________________________________________________________________________________________________________                                               We are excited to introduce MyChart, a new best-in-class service that provides you online access to important information in your electronic medical record. We want to make it easier for you to view your health information - all in one secure location - when and where you need it. We expect MyChart will enhance the quality of care and service we provide.  When you register for MyChart, you can:    View your test results.    Request appointments and receive appointment reminders via email.    Request medication renewals.    View your medical history, allergies,  medications and immunizations.    Communicate with your physician's office through a password-protected site.    Conveniently print information such as your medication lists.  To find out if MyChart is right for you, please talk to a member of our clinical staff today. We will gladly answer your questions about this free health and wellness tool.  If you are age 7 or older and want a member of your family to have access to your record, you must provide written consent by completing a proxy form available at our office. Please speak to our clinical staff about guidelines regarding accounts for patients younger than age 51.  As you activate your MyChart account and need any technical assistance, please call the MyChart technical support line at [phone]-CHART [phone]) or email your question to [email or web address]. If you email your question(s), please include your name, a return phone number and the best time to reach you.  If you have non-urgent health-related questions, you can send a message to our office through MyChart at [email or web address]. If you have a medical emergency, call 911.  Thank you for using MyChart as your new health and wellness resource!   MyChart licensed from [employer],  8413-2440. Patents Pending.

## 2013-05-24 NOTE — Telephone Encounter (Signed)
Unable to reach pt; neither number works. Will mail a letter; no B12 at Dr [facility] either. Pt is on Medicare, will ask her if she wants to apply for assistance for nasal B12.

## 2013-06-01 ENCOUNTER — Encounter: Payer: Self-pay | Admitting: Gastroenterology

## 2013-06-01 ENCOUNTER — Ambulatory Visit (AMBULATORY_SURGERY_CENTER): Payer: Medicare Other | Admitting: Gastroenterology

## 2013-06-01 VITALS — BP 138/78 | HR 60 | Temp 98.8°F | Resp 34 | Ht 66.0 in | Wt 229.0 lb

## 2013-06-01 DIAGNOSIS — K5730 Diverticulosis of large intestine without perforation or abscess without bleeding: Secondary | ICD-10-CM | POA: Diagnosis not present

## 2013-06-01 DIAGNOSIS — R109 Unspecified abdominal pain: Secondary | ICD-10-CM | POA: Diagnosis not present

## 2013-06-01 DIAGNOSIS — R634 Abnormal weight loss: Secondary | ICD-10-CM

## 2013-06-01 DIAGNOSIS — D126 Benign neoplasm of colon, unspecified: Secondary | ICD-10-CM | POA: Diagnosis not present

## 2013-06-01 MED ORDER — SODIUM CHLORIDE 0.9 % IV SOLN: 500.0000 mL | INTRAVENOUS | Status: DC

## 2013-06-01 NOTE — Op Note (Signed)
[facility] [address] [place], [address]   COLONOSCOPY PROCEDURE REPORT  PATIENT: [name], [name]  MR#: [id number] BIRTHDATE: 06/02/1966 , 47  yrs. old GENDER: Female ENDOSCOPIST: Mardella Layman, MD, Clementeen Graham REFERRED BY:  Eleonore Chiquito, M.D. PROCEDURE DATE:  06/01/2013 PROCEDURE:   Colonoscopy with biopsy ASA CLASS:   Class II INDICATIONS:Average risk patient for colon cancer. MEDICATIONS: propofol (Diprivan) 500mg  IV  DESCRIPTION OF PROCEDURE:   After the risks and benefits and of the procedure were explained, informed consent was obtained.  A digital rectal exam revealed no abnormalities of the rectum.    The LB [id number]  endoscope was introduced through the anus and advanced to the cecum, which was identified by both the appendix and ileocecal valve .  The quality of the prep was good, using MoviPrep .  The instrument was then slowly withdrawn as the colon was fully examined.     COLON FINDINGS: A small smooth flat polyp was found in the sigmoid colon.  A biopsy was performed using cold forceps.   Mild diverticulosis was noted in the sigmoid colon and descending colon. The colon was otherwise normal.  There was no  inflammation, polyps or cancers unless previously stated.     Retroflexed views revealed no abnormalities.     The scope was then withdrawn from the patient and the procedure completed.  COMPLICATIONS: There were no complications. ENDOSCOPIC IMPRESSION: 1.   Small flat polyp was found in the sigmoid colon; biopsy was performed using cold forceps ..probable hyperplastic nodule 2.   Mild diverticulosis was noted in the sigmoid colon and descending colon 3.   The colon was otherwise normal  RECOMMENDATIONS: 1.  Await pathology results 2.  Repeat colonoscopy in 5 years if polyp adenomatous; otherwise 10 years 3.  High fiber diet with liberal fluid intake. 4.  Metamucil or benefiber   REPEAT  EXAM:  cc:  _______________________________ eSignedMardella Layman, MD, [facility] 06/01/2013 10:50 AM     PATIENT NAME:  [name], [name] MR#: [id number]

## 2013-06-01 NOTE — Progress Notes (Signed)
Patient did not experience any of the following events: a burn prior to discharge; a fall within the facility; wrong site/side/patient/procedure/implant event; or a hospital transfer or hospital admission upon discharge from the facility. (G8907) Patient did not have preoperative order for IV antibiotic SSI prophylaxis. (G8918)  

## 2013-06-01 NOTE — Progress Notes (Signed)
Called to room to assist during endoscopic procedure.  Patient ID and intended procedure confirmed with present staff. Received instructions for my participation in the procedure from the performing physician.  

## 2013-06-01 NOTE — Progress Notes (Signed)
Lidocaine-40mg IV prior to Propofol InductionPropofol given over incremental dosages 

## 2013-06-01 NOTE — Patient Instructions (Addendum)

## 2013-06-02 ENCOUNTER — Encounter: Payer: Self-pay | Admitting: Internal Medicine

## 2013-06-02 ENCOUNTER — Ambulatory Visit (INDEPENDENT_AMBULATORY_CARE_PROVIDER_SITE_OTHER): Payer: Medicare Other | Admitting: Internal Medicine

## 2013-06-02 ENCOUNTER — Telehealth: Payer: Self-pay

## 2013-06-02 VITALS — BP 138/90 | HR 75 | Temp 98.3°F | Resp 20 | Wt 236.0 lb

## 2013-06-02 DIAGNOSIS — D518 Other vitamin B12 deficiency anemias: Secondary | ICD-10-CM

## 2013-06-02 DIAGNOSIS — I10 Essential (primary) hypertension: Secondary | ICD-10-CM

## 2013-06-02 MED ORDER — INTEGRA 62.5-62.5-40-3 MG PO CAPS: 1.0000 | ORAL_CAPSULE | Freq: Every day | ORAL | Status: DC

## 2013-06-02 MED ORDER — CYANOCOBALAMIN 1000 MCG/ML IJ SOLN: 1000.0000 ug | Freq: Once | INTRAMUSCULAR | Status: DC

## 2013-06-02 MED ORDER — HYDROCODONE-ACETAMINOPHEN 5-300 MG PO TABS: 1.0000 | ORAL_TABLET | Freq: Four times a day (QID) | ORAL | Status: DC | PRN

## 2013-06-02 NOTE — Patient Instructions (Addendum)
Obtain your prescription for vitamin B12 and return to the office for your first injection  Continue warm compresses to the right arm and keep the right arm elevated as much as possible  Aleve 2 twice daily  Take pain medications as directed  Call or return to clinic prn if these symptoms worsen or fail to improve as anticipated.   Please check your blood pressure on a regular basis.  If it is consistently greater than 150/90, please make an office appointment.  Limit your sodium (Salt) intake  You need to lose weight.  Consider a lower calorie diet and regular exercise.

## 2013-06-02 NOTE — Progress Notes (Signed)
Subjective:    Patient ID: [name], female    DOB: 11/05/1966, 47 y.o.   MRN: [id number]  HPI  47 year old patient who is status with our practice about 6 weeks ago. She has a history of hypertension. She has had a GI consultation and colonoscopy yesterday do to mildly severe anemia. She was also noted to have a B12 deficiency. Today she complains of pain involving her right lower arm at the side of her IV infusion yesterday.. She states the pain has not responded to tramadol.  Past Medical History  Diagnosis Date  . Hypertension   . Anemia   . Fibroids   . Thyroid goiter     History   Social History  . Marital Status: Single    Spouse Name: N/A    Number of Children: 2  . Years of Education: N/A   Occupational History  . disabled    Social History Main Topics  . Smoking status: Current Every Day Smoker -- 0.50 packs/day for 20 years    Types: Cigarettes  . Smokeless tobacco: Never Used  . Alcohol Use: 1.0 oz/week    2 drink(s) per week     Comment: occasionally once per month  . Drug Use: No  . Sexually Active: Yes    Birth Control/ Protection: Surgical   Other Topics Concern  . Not on file   Social History Narrative   Applying for disability, previously worked in [employer]    Past Surgical History  Procedure Laterality Date  . Ankle surgery Right 2009    broken ankle   . Tubal ligation    . Replacement total knee Right 2012    Family History  Problem Relation Age of Onset  . Hypertension Mother   . Hypertension Father   . Cancer Maternal Grandmother     ? lung    No Known Allergies  Current Outpatient Prescriptions on File Prior to Visit  Medication Sig Dispense Refill  . traMADol (ULTRAM) 50 MG tablet Take 1 tablet (50 mg total) by mouth every 6 (six) hours as needed for pain.  30 tablet  1   No current facility-administered medications on file prior to visit.    BP 138/90  Pulse 75  Temp(Src) 98.3 F (36.8 C) (Oral)  Resp 20  Wt  236 lb (107.049 kg)  BMI 38.11 kg/m2  SpO2 99%  LMP 05/07/2013       Review of Systems  Constitutional: Negative.   HENT: Negative for hearing loss, congestion, sore throat, rhinorrhea, dental problem, sinus pressure and tinnitus.   Eyes: Negative for pain, discharge and visual disturbance.  Respiratory: Negative for cough and shortness of breath.   Cardiovascular: Negative for chest pain, palpitations and leg swelling.  Gastrointestinal: Negative for nausea, vomiting, abdominal pain, diarrhea, constipation, blood in stool and abdominal distention.  Genitourinary: Negative for dysuria, urgency, frequency, hematuria, flank pain, vaginal bleeding, vaginal discharge, difficulty urinating, vaginal pain and pelvic pain.  Musculoskeletal: Negative for joint swelling, arthralgias and gait problem.       Pain right lower arm  Skin: Negative for rash.  Neurological: Negative for dizziness, syncope, speech difficulty, weakness, numbness and headaches.  Hematological: Negative for adenopathy.  Psychiatric/Behavioral: Negative for behavioral problems, dysphoric mood and agitation. The patient is not nervous/anxious.        Objective:   Physical Exam  Constitutional: She is oriented to person, place, and time. She appears well-developed and well-nourished.  Overweight  HENT:  Head: Normocephalic.  Right  Ear: External ear normal.  Left Ear: External ear normal.  Mouth/Throat: Oropharynx is clear and moist.  Eyes: Conjunctivae and EOM are normal. Pupils are equal, round, and reactive to light.  Neck: Normal range of motion. Neck supple. Thyromegaly present.  Cardiovascular: Normal rate, regular rhythm, normal heart sounds and intact distal pulses.   Pulmonary/Chest: Effort normal and breath sounds normal.  Abdominal: Soft. Bowel sounds are normal. She exhibits no mass. There is no tenderness.  Musculoskeletal: Normal range of motion.  Mild tenderness involving the soft tissues of her right  lower arm;  no significant swelling or erythema  Lymphadenopathy:    She has no cervical adenopathy.  Neurological: She is alert and oriented to person, place, and time.  Skin: Skin is warm and dry. No rash noted.  Psychiatric: She has a normal mood and affect. Her behavior is normal.          Assessment & Plan:   Right lower arm pain. Probable mild superficial phlebitis related to the IV infusion. No upper arm swelling or tenderness. Will treat with warm compresses elevation and anti-inflammatory medications. Was also given a prescription for additional analgesics Hypertension blood pressure remains in a high normal range off medication. We'll continue to observe weight loss smoking cessation encouraged  Obesity  Recheck 6 months or as needed

## 2013-06-02 NOTE — Telephone Encounter (Signed)
  Follow up Call-  Call back number 06/01/2013  Post procedure Call Back phone  # [phone]  Permission to leave phone message Yes     Patient questions:  Do you have a fever, pain , or abdominal swelling? no Pain Score  0 *  Have you tolerated food without any problems? yes  Have you been able to return to your normal activities? yes  Do you have any questions about your discharge instructions: Diet   no Medications  no Follow up visit  no  Do you have questions or concerns about your Care? no  Actions: * If pain score is 4 or above: No action needed, pain <4.  Patient complain of swelling and soreness to her right lower arm at IV site. No drainage or red streaks. Applied warm compresses while at home and elevated her arm. Patient took pain medication with no relief. Stated she had an appointment today to see her primary care Doctor and will let him assess IV site. Instructed her to call us back and let us know what Doctor results. Heat packs were applied during recovery period. Slight swelling to right lower arm compared to left. No redness or drainage at IV site.

## 2013-06-07 ENCOUNTER — Encounter: Payer: Self-pay | Admitting: *Deleted

## 2013-06-07 ENCOUNTER — Encounter: Payer: Self-pay | Admitting: Gastroenterology

## 2013-06-09 DIAGNOSIS — M25569 Pain in unspecified knee: Secondary | ICD-10-CM | POA: Diagnosis not present

## 2013-06-17 ENCOUNTER — Telehealth: Payer: Self-pay | Admitting: Internal Medicine

## 2013-06-17 DIAGNOSIS — M25569 Pain in unspecified knee: Secondary | ICD-10-CM | POA: Diagnosis not present

## 2013-06-17 MED ORDER — HYDROCODONE-ACETAMINOPHEN 5-325 MG PO TABS: 1.0000 | ORAL_TABLET | Freq: Four times a day (QID) | ORAL | Status: DC | PRN

## 2013-06-17 NOTE — Telephone Encounter (Signed)
Spoke to pt told her there is a generic for what was sent in looks like it was sent in wrong. I will call pharmacy and call in generic form of medication. Pt verbalized understanding. Called pt back told her Rx had to be changed to Vicodin 5-325 mg for generic the 5-300 mg does not come in generic, so I called in the 5-325 mg tablets. Pt verbalized understanding.

## 2013-06-17 NOTE — Telephone Encounter (Signed)
Pt states her insurance would not pay for the Hydrocodone-Acetaminophen 5-300 MG TABS. Pt asked if there is something else in a generic that MD could RX. Pharm: [facility]

## 2013-06-27 DIAGNOSIS — M25569 Pain in unspecified knee: Secondary | ICD-10-CM | POA: Diagnosis not present

## 2013-06-27 DIAGNOSIS — S83289A Other tear of lateral meniscus, current injury, unspecified knee, initial encounter: Secondary | ICD-10-CM | POA: Diagnosis not present

## 2013-06-29 ENCOUNTER — Other Ambulatory Visit: Payer: Self-pay | Admitting: Internal Medicine

## 2013-07-12 ENCOUNTER — Other Ambulatory Visit: Payer: Self-pay | Admitting: Internal Medicine

## 2013-07-14 NOTE — Telephone Encounter (Signed)
ok 

## 2013-07-28 ENCOUNTER — Telehealth: Payer: Self-pay | Admitting: Internal Medicine

## 2013-07-28 NOTE — Telephone Encounter (Signed)
Pt request refill of HYDROcodone-acetaminophen (NORCO/VICODIN) 5-325 MG per Pharm: [facility]  Pt has made an appt for next week, complaining of arm pain.

## 2013-08-01 ENCOUNTER — Other Ambulatory Visit: Payer: Self-pay | Admitting: Internal Medicine

## 2013-08-02 ENCOUNTER — Emergency Department (HOSPITAL_COMMUNITY): Payer: Medicare Other

## 2013-08-02 ENCOUNTER — Emergency Department (HOSPITAL_COMMUNITY)
Admission: EM | Admit: 2013-08-02 | Discharge: 2013-08-02 | Disposition: A | Discharge status: Home / Self Care | Payer: Medicare Other | Attending: Emergency Medicine | Admitting: Emergency Medicine

## 2013-08-02 ENCOUNTER — Ambulatory Visit: Payer: Medicare Other | Admitting: Internal Medicine

## 2013-08-02 ENCOUNTER — Encounter (HOSPITAL_COMMUNITY): Payer: Self-pay | Admitting: Emergency Medicine

## 2013-08-02 DIAGNOSIS — Z3202 Encounter for pregnancy test, result negative: Secondary | ICD-10-CM | POA: Insufficient documentation

## 2013-08-02 DIAGNOSIS — F17200 Nicotine dependence, unspecified, uncomplicated: Secondary | ICD-10-CM | POA: Insufficient documentation

## 2013-08-02 DIAGNOSIS — R300 Dysuria: Secondary | ICD-10-CM | POA: Diagnosis not present

## 2013-08-02 DIAGNOSIS — Z8639 Personal history of other endocrine, nutritional and metabolic disease: Secondary | ICD-10-CM | POA: Insufficient documentation

## 2013-08-02 DIAGNOSIS — N390 Urinary tract infection, site not specified: Secondary | ICD-10-CM | POA: Diagnosis not present

## 2013-08-02 DIAGNOSIS — Z113 Encounter for screening for infections with a predominantly sexual mode of transmission: Secondary | ICD-10-CM | POA: Insufficient documentation

## 2013-08-02 DIAGNOSIS — N949 Unspecified condition associated with female genital organs and menstrual cycle: Secondary | ICD-10-CM | POA: Diagnosis not present

## 2013-08-02 DIAGNOSIS — R109 Unspecified abdominal pain: Secondary | ICD-10-CM | POA: Diagnosis not present

## 2013-08-02 DIAGNOSIS — Z9851 Tubal ligation status: Secondary | ICD-10-CM | POA: Insufficient documentation

## 2013-08-02 DIAGNOSIS — Z8542 Personal history of malignant neoplasm of other parts of uterus: Secondary | ICD-10-CM | POA: Diagnosis not present

## 2013-08-02 DIAGNOSIS — N898 Other specified noninflammatory disorders of vagina: Secondary | ICD-10-CM | POA: Insufficient documentation

## 2013-08-02 DIAGNOSIS — N83209 Unspecified ovarian cyst, unspecified side: Secondary | ICD-10-CM | POA: Diagnosis not present

## 2013-08-02 DIAGNOSIS — Z79899 Other long term (current) drug therapy: Secondary | ICD-10-CM | POA: Diagnosis not present

## 2013-08-02 DIAGNOSIS — Z862 Personal history of diseases of the blood and blood-forming organs and certain disorders involving the immune mechanism: Secondary | ICD-10-CM | POA: Diagnosis not present

## 2013-08-02 DIAGNOSIS — I10 Essential (primary) hypertension: Secondary | ICD-10-CM | POA: Diagnosis not present

## 2013-08-02 LAB — WET PREP, GENITAL: Yeast Wet Prep HPF POC: NONE SEEN

## 2013-08-02 LAB — URINALYSIS, ROUTINE W REFLEX MICROSCOPIC
Glucose, UA: NEGATIVE mg/dL
Ketones, ur: 15 mg/dL — AB
Protein, ur: 30 mg/dL — AB

## 2013-08-02 LAB — GC/CHLAMYDIA PROBE AMP: CT Probe RNA: NEGATIVE

## 2013-08-02 LAB — URINE MICROSCOPIC-ADD ON

## 2013-08-02 MED ORDER — CEFTRIAXONE SODIUM 250 MG IJ SOLR
250.0000 mg | Freq: Once | INTRAMUSCULAR | Status: AC
  Administered 2013-08-02: 250 mg via INTRAMUSCULAR
  Filled 2013-08-02: qty 250

## 2013-08-02 MED ORDER — IBUPROFEN 600 MG PO TABS: 600.0000 mg | ORAL_TABLET | Freq: Four times a day (QID) | ORAL | Status: DC | PRN

## 2013-08-02 MED ORDER — AZITHROMYCIN 250 MG PO TABS
1000.0000 mg | ORAL_TABLET | Freq: Once | ORAL | Status: AC
  Administered 2013-08-02: 1000 mg via ORAL
  Filled 2013-08-02: qty 4

## 2013-08-02 MED ORDER — METRONIDAZOLE 500 MG PO TABS: 500.0000 mg | ORAL_TABLET | Freq: Two times a day (BID) | ORAL | Status: DC
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
## 2013-08-05 LAB — URINE CULTURE: Colony Count: >=100000

## 2013-08-06 ENCOUNTER — Telehealth (HOSPITAL_COMMUNITY): Payer: Self-pay | Admitting: Emergency Medicine

## 2013-08-06 NOTE — ED Notes (Signed)
Post ED Visit - Positive Culture Follow-up  Culture report reviewed by antimicrobial stewardship pharmacist: []  Wes Dulaney, Pharm.D., BCPS []  Celedonio Miyamoto, Pharm.D., BCPS []  Georgina Pillion, [address].D., BCPS []  [place], [address].D., BCPS, AAHIVP []  Estella Husk, Pharm.D., BCPS, AAHIVP [x]  Alla German, Pharm.D.  Positive urine culture Treated with Keflex, organism sensitive to the same and no further patient follow-up is required at this time.  Kylie A Holland 08/06/2013, 2:49 PM

## 2013-08-11 ENCOUNTER — Encounter: Payer: Self-pay | Admitting: Internal Medicine

## 2013-08-11 ENCOUNTER — Ambulatory Visit (INDEPENDENT_AMBULATORY_CARE_PROVIDER_SITE_OTHER): Payer: Medicare Other | Admitting: Internal Medicine

## 2013-08-11 VITALS — BP 148/98 | HR 82 | Temp 98.3°F | Wt 232.0 lb

## 2013-08-11 DIAGNOSIS — I10 Essential (primary) hypertension: Secondary | ICD-10-CM

## 2013-08-11 DIAGNOSIS — M25429 Effusion, unspecified elbow: Secondary | ICD-10-CM | POA: Diagnosis not present

## 2013-08-11 DIAGNOSIS — M25421 Effusion, right elbow: Secondary | ICD-10-CM

## 2013-08-11 DIAGNOSIS — M1990 Unspecified osteoarthritis, unspecified site: Secondary | ICD-10-CM | POA: Diagnosis not present

## 2013-08-11 MED ORDER — HYDROCODONE-ACETAMINOPHEN 5-325 MG PO TABS: ORAL_TABLET | ORAL | Status: DC

## 2013-08-11 NOTE — Progress Notes (Signed)
Subjective:    Patient ID: [name], female    DOB: 09/03/1966, 47 y.o.   MRN: [id number]  HPI   47 year old patient who is seen today for followup. She presents with a chief complaint of persistent right arm pain and swelling this he states began following a colonoscopy 2 months ago. She did have IV access placed in the right arm. There is apparently some soft tissue swelling about the IV infusion site. She states the pain and swelling improves with elevation. Past Medical History  Diagnosis Date  . Hypertension   . Anemia   . Fibroids   . Thyroid goiter     History   Social History  . Marital Status: Single    Spouse Name: N/A    Number of Children: 2  . Years of Education: N/A   Occupational History  . disabled    Social History Main Topics  . Smoking status: Current Every Day Smoker -- 0.50 packs/day for 20 years    Types: Cigarettes  . Smokeless tobacco: Never Used  . Alcohol Use: 1.0 oz/week    2 drink(s) per week     Comment: occasionally once per month  . Drug Use: No  . Sexual Activity: Yes    Birth Control/ Protection: Surgical   Other Topics Concern  . Not on file   Social History Narrative   Applying for disability, previously worked in [employer]    Past Surgical History  Procedure Laterality Date  . Ankle surgery Right 2009    broken ankle   . Tubal ligation    . Replacement total knee Right 2012    Family History  Problem Relation Age of Onset  . Hypertension Mother   . Hypertension Father   . Cancer Maternal Grandmother     ? lung    No Known Allergies  Current Outpatient Prescriptions on File Prior to Visit  Medication Sig Dispense Refill  . aspirin-acetaminophen-caffeine (EXCEDRIN MIGRAINE) 250-250-65 MG per tablet Take 1 tablet by mouth every 6 (six) hours as needed for pain.      . Fe Fum-FePoly-Vit C-Vit B3 (INTEGRA) 62.5-62.5-40-3 MG CAPS Take 1 tablet by mouth daily.  100 capsule  6  . ibuprofen (ADVIL,MOTRIN) 600 MG tablet  Take 1 tablet (600 mg total) by mouth every 6 (six) hours as needed for pain.  30 tablet  0   No current facility-administered medications on file prior to visit.    BP 148/98  Pulse 82  Temp(Src) 98.3 F (36.8 C) (Oral)  Wt 232 lb (105.235 kg)  BMI 37.46 kg/m2  SpO2 99%  LMP 07/26/2013      BP Readings from Last 3 Encounters:  08/11/13 148/98  08/02/13 154/69  06/02/13 138/90    Review of Systems  Musculoskeletal:       Intermittent pain and swelling involving the right arm distal to the elbow       Objective:   Physical Exam  Constitutional: She appears well-developed and well-nourished. No distress.  Blood pressure 150/96  Musculoskeletal: She exhibits no edema.  Mild tenderness involving the soft tissues of the right forearm No obvious clinical edema          Assessment & Plan:   Intermittent right arm pain and swelling.  Patient is concerned about a blood clot related to prior IV access. Due to the chronicity of her symptoms we'll perform a venous Doppler of the right arm  History of hyp. Blood pressure is elevated today. She has  been on atenolol in the past. Patient was asked to adhere to a low-salt diet and to track her blood pressures over the next month. We'll reassess her blood pressure in 4 weeksertension

## 2013-08-11 NOTE — Patient Instructions (Addendum)
Limit your sodium (Salt) intake  Please check your blood pressure on a regular basis.  If it is consistently greater than 150/90, please make an office appointment.  Return in one month for follow-up  Right arm venous Doppler study as discussed

## 2013-08-17 DIAGNOSIS — Y998 Other external cause status: Secondary | ICD-10-CM | POA: Diagnosis not present

## 2013-08-17 DIAGNOSIS — X58XXXA Exposure to other specified factors, initial encounter: Secondary | ICD-10-CM | POA: Diagnosis not present

## 2013-08-17 DIAGNOSIS — S83289A Other tear of lateral meniscus, current injury, unspecified knee, initial encounter: Secondary | ICD-10-CM | POA: Diagnosis not present

## 2013-08-17 DIAGNOSIS — Y929 Unspecified place or not applicable: Secondary | ICD-10-CM | POA: Diagnosis not present

## 2013-08-17 DIAGNOSIS — M23305 Other meniscus derangements, unspecified medial meniscus, unspecified knee: Secondary | ICD-10-CM | POA: Diagnosis not present

## 2013-08-17 DIAGNOSIS — M1710 Unilateral primary osteoarthritis, unspecified knee: Secondary | ICD-10-CM | POA: Diagnosis not present

## 2013-08-17 DIAGNOSIS — M6789 Other specified disorders of synovium and tendon, multiple sites: Secondary | ICD-10-CM | POA: Diagnosis not present

## 2013-08-17 DIAGNOSIS — M2240 Chondromalacia patellae, unspecified knee: Secondary | ICD-10-CM | POA: Diagnosis not present

## 2013-08-17 DIAGNOSIS — Y9389 Activity, other specified: Secondary | ICD-10-CM | POA: Diagnosis not present

## 2013-08-24 ENCOUNTER — Other Ambulatory Visit: Payer: Self-pay | Admitting: *Deleted

## 2013-08-24 ENCOUNTER — Encounter (INDEPENDENT_AMBULATORY_CARE_PROVIDER_SITE_OTHER): Payer: Medicare Other

## 2013-08-24 DIAGNOSIS — R609 Edema, unspecified: Secondary | ICD-10-CM | POA: Diagnosis not present

## 2013-08-29 ENCOUNTER — Other Ambulatory Visit: Payer: Self-pay | Admitting: *Deleted

## 2013-08-29 MED ORDER — HYDROCODONE-ACETAMINOPHEN 5-325 MG PO TABS: ORAL_TABLET | ORAL | Status: DC

## 2013-09-05 ENCOUNTER — Ambulatory Visit: Payer: Medicare Other | Attending: Orthopedic Surgery | Admitting: Physical Therapy

## 2013-09-05 DIAGNOSIS — R269 Unspecified abnormalities of gait and mobility: Secondary | ICD-10-CM | POA: Diagnosis not present

## 2013-09-05 DIAGNOSIS — M25569 Pain in unspecified knee: Secondary | ICD-10-CM | POA: Insufficient documentation

## 2013-09-05 DIAGNOSIS — M25669 Stiffness of unspecified knee, not elsewhere classified: Secondary | ICD-10-CM | POA: Diagnosis not present

## 2013-09-07 ENCOUNTER — Ambulatory Visit: Payer: Medicare Other | Admitting: Physical Therapy

## 2013-09-08 ENCOUNTER — Ambulatory Visit (INDEPENDENT_AMBULATORY_CARE_PROVIDER_SITE_OTHER): Payer: Medicare Other | Admitting: Internal Medicine

## 2013-09-08 ENCOUNTER — Encounter: Payer: Self-pay | Admitting: Internal Medicine

## 2013-09-08 VITALS — BP 140/80 | HR 84 | Temp 97.9°F | Resp 20 | Wt 223.0 lb

## 2013-09-08 DIAGNOSIS — F17200 Nicotine dependence, unspecified, uncomplicated: Secondary | ICD-10-CM

## 2013-09-08 DIAGNOSIS — E0520 Thyrotoxicosis with toxic multinodular goiter without thyrotoxic crisis or storm: Secondary | ICD-10-CM

## 2013-09-08 DIAGNOSIS — M1990 Unspecified osteoarthritis, unspecified site: Secondary | ICD-10-CM

## 2013-09-08 DIAGNOSIS — Z862 Personal history of diseases of the blood and blood-forming organs and certain disorders involving the immune mechanism: Secondary | ICD-10-CM | POA: Diagnosis not present

## 2013-09-08 DIAGNOSIS — D518 Other vitamin B12 deficiency anemias: Secondary | ICD-10-CM

## 2013-09-08 MED ORDER — CYANOCOBALAMIN 1000 MCG/ML IJ SOLN
1000.0000 ug | Freq: Once | INTRAMUSCULAR | Status: AC
  Administered 2013-09-08: 1000 ug via INTRAMUSCULAR

## 2013-09-08 MED ORDER — CYANOCOBALAMIN 1000 MCG/ML IJ SOLN: INTRAMUSCULAR | Status: DC

## 2013-09-08 NOTE — Progress Notes (Signed)
Subjective:    Patient ID: [name], female    DOB: May 26, 1966, 47 y.o.   MRN: [id number]  HPI  47 year old patient who is seen today for followup. She was seen one month ago and blood pressure was elevated. She does have a history of hypertension requiring treatment in the past. She also has a recent diagnosis B12 deficiency but has not started B12 injections. She states that she has had a difficult time obtaining the medication. She continues to have the right arm pain. This appears to have localized to the right lateral elbow area.  Past Medical History  Diagnosis Date  . Hypertension   . Anemia   . Fibroids   . Thyroid goiter     History   Social History  . Marital Status: Single    Spouse Name: N/A    Number of Children: 2  . Years of Education: N/A   Occupational History  . disabled    Social History Main Topics  . Smoking status: Current Every Day Smoker -- 0.50 packs/day for 20 years    Types: Cigarettes  . Smokeless tobacco: Never Used  . Alcohol Use: 1.0 oz/week    2 drink(s) per week     Comment: occasionally once per month  . Drug Use: No  . Sexual Activity: Yes    Birth Control/ Protection: Surgical   Other Topics Concern  . Not on file   Social History Narrative   Applying for disability, previously worked in [employer]    Past Surgical History  Procedure Laterality Date  . Ankle surgery Right 2009    broken ankle   . Tubal ligation    . Replacement total knee Right 2012    Family History  Problem Relation Age of Onset  . Hypertension Mother   . Hypertension Father   . Cancer Maternal Grandmother     ? lung    No Known Allergies  Current Outpatient Prescriptions on File Prior to Visit  Medication Sig Dispense Refill  . aspirin-acetaminophen-caffeine (EXCEDRIN MIGRAINE) 250-250-65 MG per tablet Take 1 tablet by mouth every 6 (six) hours as needed for pain.      . Fe Fum-FePoly-Vit C-Vit B3 (INTEGRA) 62.5-62.5-40-3 MG CAPS Take 1  tablet by mouth daily.  100 capsule  6  . HYDROcodone-acetaminophen (NORCO/VICODIN) 5-325 MG per tablet TAKE ONE TABLET BY MOUTH EVERY 6 HOURS AS NEEDED FOR PAIN  60 tablet  0  . ibuprofen (ADVIL,MOTRIN) 600 MG tablet Take 1 tablet (600 mg total) by mouth every 6 (six) hours as needed for pain.  30 tablet  0   No current facility-administered medications on file prior to visit.    BP 140/80  Pulse 84  Temp(Src) 97.9 F (36.6 C) (Oral)  Resp 20  Wt 223 lb (101.152 kg)  BMI 36.01 kg/m2  SpO2 99%  LMP 08/22/2013        Review of Systems  Constitutional: Negative.   HENT: Negative for hearing loss, congestion, sore throat, rhinorrhea, dental problem, sinus pressure and tinnitus.   Eyes: Negative for pain, discharge and visual disturbance.  Respiratory: Negative for cough and shortness of breath.   Cardiovascular: Negative for chest pain, palpitations and leg swelling.  Gastrointestinal: Negative for nausea, vomiting, abdominal pain, diarrhea, constipation, blood in stool and abdominal distention.  Genitourinary: Negative for dysuria, urgency, frequency, hematuria, flank pain, vaginal bleeding, vaginal discharge, difficulty urinating, vaginal pain and pelvic pain.  Musculoskeletal: Negative for joint swelling, arthralgias and gait problem.  Right lateral elbow discomfort  Skin: Negative for rash.  Neurological: Negative for dizziness, syncope, speech difficulty, weakness, numbness and headaches.  Hematological: Negative for adenopathy.  Psychiatric/Behavioral: Negative for behavioral problems, dysphoric mood and agitation. The patient is not nervous/anxious.        Objective:   Physical Exam  Constitutional:  Blood pressure 140/80  Musculoskeletal:  Tenderness to palpation over the right lateral elbow          Assessment & Plan:  Bilateral elbow pain. Probable lateral epicondylitis. Hypertension. Blood pressure normal today off therapy we'll continue to monitor  myself issues addressed. Low-salt diet weight loss and exercise all encouraged B12 deficiency. Compliance stressed. The patient was instructed on monthly B12 injections

## 2013-09-08 NOTE — Patient Instructions (Addendum)
Lateral Epicondylitis (Tennis Elbow) with Rehab Lateral epicondylitis involves inflammation and pain around the outer portion of the elbow. The pain is caused by inflammation of the tendons in the forearm that bring back (extend) the wrist. Lateral epicondylittis is also called tennis elbow, because it is very common in tennis players. However, it may occur in any individual who extends the wrist repetitively. If lateral epicondylitis is left untreated, it may become a chronic problem. SYMPTOMS   Pain, tenderness, and inflammation on the outer (lateral) side of the elbow.  Pain or weakness with gripping activities.  Pain that increases with wrist twisting motions (playing tennis, using a screwdriver, opening a door or a jar).  Pain with lifting objects, including a coffee cup. CAUSES  Lateral epicondylitis is caused by inflammation of the tendons that extend the wrist. Causes of injury may include:  Repetitive stress and strain on the muscles and tendons that extend the wrist.  Sudden change in activity level or intensity.  Incorrect grip in racquet sports.  Incorrect grip size of racquet (often too large).  Incorrect hitting position or technique (usually backhand, leading with the elbow).  Using a racket that is too heavy. RISK INCREASES WITH:  Sports or occupations that require repetitive and/or strenuous forearm and wrist movements (tennis, squash, racquetball, carpentry).  Poor wrist and forearm strength and flexibility.  Failure to warm up properly before activity.  Resuming activity before healing, rehabilitation, and conditioning are complete. PREVENTION   Warm up and stretch properly before activity.  Maintain physical fitness:  Strength, flexibility, and endurance.  Cardiovascular fitness.  Wear and use properly fitted equipment.  Learn and use proper technique and have a coach correct improper technique.  Wear a tennis elbow (counterforce) brace. PROGNOSIS   The course of this condition depends on the degree of the injury. If treated properly, acute cases (symptoms lasting less than 4 weeks) are often resolved in 2 to 6 weeks. Chronic (longer lasting cases) often resolve in 3 to 6 months, but may require physical therapy. RELATED COMPLICATIONS   Frequently recurring symptoms, resulting in a chronic problem. Properly treating the problem the first time decreases frequency of recurrence.  Chronic inflammation, scarring tendon degeneration, and partial tendon tear, requiring surgery.  Delayed healing or resolution of symptoms. TREATMENT  Treatment first involves the use of ice and medicine, to reduce pain and inflammation. Strengthening and stretching exercises may help reduce discomfort, if performed regularly. These exercises may be performed at home, if the condition is an acute injury. Chronic cases may require a referral to a physical therapist for evaluation and treatment. Your caregiver may advise a corticosteroid injection, to help reduce inflammation. Rarely, surgery is needed. MEDICATION  If pain medicine is needed, nonsteroidal anti-inflammatory medicines (aspirin and ibuprofen), or other minor pain relievers (acetaminophen), are often advised.  Do not take pain medicine for 7 days before surgery.  Prescription pain relievers may be given, if your caregiver thinks they are needed. Use only as directed and only as much as you need.  Corticosteroid injections may be recommended. These injections should be reserved only for the most severe cases, because they can only be given a certain number of times. HEAT AND COLD  Cold treatment (icing) should be applied for 10 to 15 minutes every 2 to 3 hours for inflammation and pain, and immediately after activity that aggravates your symptoms. Use ice packs or an ice massage.  Heat treatment may be used before performing stretching and strengthening activities prescribed by your   caregiver, physical  therapist, or athletic trainer. Use a heat pack or a warm water soak. SEEK MEDICAL CARE IF: Symptoms get worse or do not improve in 2 weeks, despite treatment. EXERCISES  RANGE OF MOTION (ROM) AND STRETCHING EXERCISES - Epicondylitis, Lateral (Tennis Elbow) These exercises may help you when beginning to rehabilitate your injury. Your symptoms may go away with or without further involvement from your physician, physical therapist or athletic trainer. While completing these exercises, remember:   Restoring tissue flexibility helps normal motion to return to the joints. This allows healthier, less painful movement and activity.  An effective stretch should be held for at least 30 seconds.  A stretch should never be painful. You should only feel a gentle lengthening or release in the stretched tissue. RANGE OF MOTION  Wrist Flexion, Active-Assisted  Extend your right / left elbow with your fingers pointing down.*  Gently pull the back of your hand towards you, until you feel a gentle stretch on the top of your forearm.  Hold this position for __________ seconds. Repeat __________ times. Complete this exercise __________ times per day.  *If directed by your physician, physical therapist or athletic trainer, complete this stretch with your elbow bent, rather than extended. RANGE OF MOTION  Wrist Extension, Active-Assisted  Extend your right / left elbow and turn your palm upwards.*  Gently pull your palm and fingertips back, so your wrist extends and your fingers point more toward the ground.  You should feel a gentle stretch on the inside of your forearm.  Hold this position for __________ seconds. Repeat __________ times. Complete this exercise __________ times per day. *If directed by your physician, physical therapist or athletic trainer, complete this stretch with your elbow bent, rather than extended. STRETCH - Wrist Flexion  Place the back of your right / left hand on a tabletop,  leaving your elbow slightly bent. Your fingers should point away from your body.  Gently press the back of your hand down onto the table by straightening your elbow. You should feel a stretch on the top of your forearm.  Hold this position for __________ seconds. Repeat __________ times. Complete this stretch __________ times per day.  STRETCH  Wrist Extension   Place your right / left fingertips on a tabletop, leaving your elbow slightly bent. Your fingers should point backwards.  Gently press your fingers and palm down onto the table by straightening your elbow. You should feel a stretch on the inside of your forearm.  Hold this position for __________ seconds. Repeat __________ times. Complete this stretch __________ times per day.  STRENGTHENING EXERCISES - Epicondylitis, Lateral (Tennis Elbow) These exercises may help you when beginning to rehabilitate your injury. They may resolve your symptoms with or without further involvement from your physician, physical therapist or athletic trainer. While completing these exercises, remember:   Muscles can gain both the endurance and the strength needed for everyday activities through controlled exercises.  Complete these exercises as instructed by your physician, physical therapist or athletic trainer. Increase the resistance and repetitions only as guided.  You may experience muscle soreness or fatigue, but the pain or discomfort you are trying to eliminate should never worsen during these exercises. If this pain does get worse, stop and make sure you are following the directions exactly. If the pain is still present after adjustments, discontinue the exercise until you can discuss the trouble with your caregiver. STRENGTH Wrist Flexors  Sit with your right / left forearm palm-up and  fully supported on a table or countertop. Your elbow should be resting below the height of your shoulder. Allow your wrist to extend over the edge of the  surface.  Loosely holding a __________ weight, or a piece of rubber exercise band or tubing, slowly curl your hand up toward your forearm.  Hold this position for __________ seconds. Slowly lower the wrist back to the starting position in a controlled manner. Repeat __________ times. Complete this exercise __________ times per day.  STRENGTH  Wrist Extensors  Sit with your right / left forearm palm-down and fully supported on a table or countertop. Your elbow should be resting below the height of your shoulder. Allow your wrist to extend over the edge of the surface.  Loosely holding a __________ weight, or a piece of rubber exercise band or tubing, slowly curl your hand up toward your forearm.  Hold this position for __________ seconds. Slowly lower the wrist back to the starting position in a controlled manner. Repeat __________ times. Complete this exercise __________ times per day.  STRENGTH - Ulnar Deviators  Stand with a ____________________ weight in your right / left hand, or sit while holding a rubber exercise band or tubing, with your healthy arm supported on a table or countertop.  Move your wrist, so that your pinkie travels toward your forearm and your thumb moves away from your forearm.  Hold this position for __________ seconds and then slowly lower the wrist back to the starting position. Repeat __________ times. Complete this exercise __________ times per day STRENGTH - Radial Deviators  Stand with a ____________________ weight in your right / left hand, or sit while holding a rubber exercise band or tubing, with your injured arm supported on a table or countertop.  Raise your hand upward in front of you or pull up on the rubber tubing.  Hold this position for __________ seconds and then slowly lower the wrist back to the starting position. Repeat __________ times. Complete this exercise __________ times per day. STRENGTH  Forearm Supinators   Sit with your right /  left forearm supported on a table, keeping your elbow below shoulder height. Rest your hand over the edge, palm down.  Gently grip a hammer or a soup ladle.  Without moving your elbow, slowly turn your palm and hand upward to a "thumbs-up" position.  Hold this position for __________ seconds. Slowly return to the starting position. Repeat __________ times. Complete this exercise __________ times per day.  STRENGTH  Forearm Pronators   Sit with your right / left forearm supported on a table, keeping your elbow below shoulder height. Rest your hand over the edge, palm up.  Gently grip a hammer or a soup ladle.  Without moving your elbow, slowly turn your palm and hand upward to a "thumbs-up" position.  Hold this position for __________ seconds. Slowly return to the starting position. Repeat __________ times. Complete this exercise __________ times per day.  STRENGTH - Grip  Grasp a tennis ball, a dense sponge, or a large, rolled sock in your hand.  Squeeze as hard as you can, without increasing any pain.  Hold this position for __________ seconds. Release your grip slowly. Repeat __________ times. Complete this exercise __________ times per day.  STRENGTH - Elbow Extensors, Isometric  Stand or sit upright, on a firm surface. Place your right / left arm so that your palm faces your stomach, and it is at the height of your waist.  Place your opposite hand on   the underside of your forearm. Gently push up as your right / left arm resists. Push as hard as you can with both arms, without causing any pain or movement at your right / left elbow. Hold this stationary position for __________ seconds. Gradually release the tension in both arms. Allow your muscles to relax completely before repeating. Document Released: 12/08/2005 Document Revised: 03/01/2012 Document Reviewed: 03/22/2009 [facility] Patient Information 2014 [place], [place]. \ Please check your blood pressure on a regular basis.   If it is consistently greater than 150/90, please make an office appointment.  Limit your sodium (Salt) intake    It is important that you exercise regularly, at least 20 minutes 3 to 4 times per week.  If you develop chest pain or shortness of breath seek  medical attention.  You need to lose weight.  Consider a lower calorie diet and regular exercise.

## 2013-09-09 ENCOUNTER — Ambulatory Visit: Payer: Medicare Other | Admitting: Physical Therapy

## 2013-09-12 ENCOUNTER — Ambulatory Visit: Payer: Medicare Other | Admitting: Physical Therapy

## 2013-09-14 ENCOUNTER — Ambulatory Visit: Payer: Medicare Other | Admitting: Physical Therapy

## 2013-09-16 ENCOUNTER — Encounter: Payer: Medicare Other | Admitting: Physical Therapy

## 2013-09-19 ENCOUNTER — Encounter: Payer: Medicare Other | Admitting: Physical Therapy

## 2013-09-21 ENCOUNTER — Encounter: Payer: Medicare Other | Admitting: Physical Therapy

## 2013-09-23 ENCOUNTER — Encounter: Payer: Medicare Other | Admitting: Physical Therapy

## 2013-09-26 ENCOUNTER — Ambulatory Visit: Payer: Medicare Other

## 2013-09-26 ENCOUNTER — Telehealth: Payer: Self-pay | Admitting: Internal Medicine

## 2013-09-26 MED ORDER — HYDROCODONE-ACETAMINOPHEN 5-325 MG PO TABS: ORAL_TABLET | ORAL | Status: DC

## 2013-09-26 NOTE — Telephone Encounter (Signed)
Please see message and advise what pt can do for her arm. I have Rx ready.

## 2013-09-26 NOTE — Telephone Encounter (Signed)
Left message on voicemail to call office. Rx ready for pick up and pt needs to schedule appt with Dr.K for cortisone injection per him.

## 2013-09-26 NOTE — Telephone Encounter (Signed)
Pt request refill of HYDROcodone-acetaminophen (NORCO/VICODIN) 5-325 MG per tablet  Pt states her arm is not any better and all the things md ask her to do is not working.  Pt not wearing the band he rx. , it irritated her arm. pls advise

## 2013-09-26 NOTE — Telephone Encounter (Signed)
Please schedule office visit for a cortisone injection

## 2013-09-27 DIAGNOSIS — IMO0002 Reserved for concepts with insufficient information to code with codable children: Secondary | ICD-10-CM | POA: Diagnosis not present

## 2013-09-27 DIAGNOSIS — Z9889 Other specified postprocedural states: Secondary | ICD-10-CM | POA: Diagnosis not present

## 2013-09-27 NOTE — Telephone Encounter (Signed)
Left message on voicemail to call office. Rx ready for pick up and pt needs to schedule appt with Dr.K for cortisone injection per him.   

## 2013-09-28 ENCOUNTER — Ambulatory Visit: Payer: Medicare Other | Attending: Orthopedic Surgery | Admitting: Physical Therapy

## 2013-09-28 DIAGNOSIS — M25569 Pain in unspecified knee: Secondary | ICD-10-CM | POA: Insufficient documentation

## 2013-09-28 DIAGNOSIS — R269 Unspecified abnormalities of gait and mobility: Secondary | ICD-10-CM | POA: Insufficient documentation

## 2013-09-28 DIAGNOSIS — M25669 Stiffness of unspecified knee, not elsewhere classified: Secondary | ICD-10-CM | POA: Insufficient documentation

## 2013-09-29 NOTE — Telephone Encounter (Signed)
Received a detailed message from pt requesting that a vm at [phone]. Advised pt to call office to make an appt to see Dr. Kirtland Bouchard for a cortisone injection. Pt's previous vm states she had picked up rx for hydrocodone and that she has an appt for a b12 on the 17th.

## 2013-09-30 ENCOUNTER — Ambulatory Visit: Payer: Medicare Other | Admitting: Physical Therapy

## 2013-10-04 ENCOUNTER — Emergency Department (HOSPITAL_COMMUNITY)
Admission: EM | Admit: 2013-10-04 | Discharge: 2013-10-04 | Disposition: A | Discharge status: Home / Self Care | Payer: Medicare Other | Attending: Emergency Medicine | Admitting: Emergency Medicine

## 2013-10-04 ENCOUNTER — Encounter (HOSPITAL_COMMUNITY): Payer: Self-pay | Admitting: Emergency Medicine

## 2013-10-04 DIAGNOSIS — N72 Inflammatory disease of cervix uteri: Secondary | ICD-10-CM | POA: Diagnosis not present

## 2013-10-04 DIAGNOSIS — A499 Bacterial infection, unspecified: Secondary | ICD-10-CM | POA: Insufficient documentation

## 2013-10-04 DIAGNOSIS — F17200 Nicotine dependence, unspecified, uncomplicated: Secondary | ICD-10-CM | POA: Diagnosis not present

## 2013-10-04 DIAGNOSIS — N760 Acute vaginitis: Secondary | ICD-10-CM | POA: Diagnosis not present

## 2013-10-04 DIAGNOSIS — Z862 Personal history of diseases of the blood and blood-forming organs and certain disorders involving the immune mechanism: Secondary | ICD-10-CM | POA: Insufficient documentation

## 2013-10-04 DIAGNOSIS — B373 Candidiasis of vulva and vagina: Secondary | ICD-10-CM | POA: Diagnosis not present

## 2013-10-04 DIAGNOSIS — Z8639 Personal history of other endocrine, nutritional and metabolic disease: Secondary | ICD-10-CM | POA: Insufficient documentation

## 2013-10-04 DIAGNOSIS — B3731 Acute candidiasis of vulva and vagina: Secondary | ICD-10-CM | POA: Insufficient documentation

## 2013-10-04 DIAGNOSIS — I10 Essential (primary) hypertension: Secondary | ICD-10-CM | POA: Insufficient documentation

## 2013-10-04 DIAGNOSIS — Z3202 Encounter for pregnancy test, result negative: Secondary | ICD-10-CM | POA: Insufficient documentation

## 2013-10-04 DIAGNOSIS — B9689 Other specified bacterial agents as the cause of diseases classified elsewhere: Secondary | ICD-10-CM | POA: Insufficient documentation

## 2013-10-04 LAB — URINALYSIS, ROUTINE W REFLEX MICROSCOPIC
Bilirubin Urine: NEGATIVE
Ketones, ur: NEGATIVE mg/dL
Nitrite: NEGATIVE
pH: 7 (ref 5.0–8.0)

## 2013-10-04 LAB — WET PREP, GENITAL: Trich, Wet Prep: NONE SEEN

## 2013-10-04 MED ORDER — FLUCONAZOLE 150 MG PO TABS
150.0000 mg | ORAL_TABLET | Freq: Once | ORAL | Status: AC
  Administered 2013-10-04: 150 mg via ORAL
  Filled 2013-10-04: qty 1

## 2013-10-04 MED ORDER — METRONIDAZOLE 500 MG PO TABS: 500.0000 mg | ORAL_TABLET | Freq: Two times a day (BID) | ORAL | Status: DC

## 2013-10-04 MED ORDER — AZITHROMYCIN 250 MG PO TABS
1000.0000 mg | ORAL_TABLET | Freq: Once | ORAL | Status: AC
  Administered 2013-10-04: 1000 mg via ORAL
  Filled 2013-10-04: qty 4

## 2013-10-04 MED ORDER — CEFTRIAXONE SODIUM 250 MG IJ SOLR
250.0000 mg | Freq: Once | INTRAMUSCULAR | Status: AC
  Administered 2013-10-04: 250 mg via INTRAMUSCULAR
  Filled 2013-10-04: qty 250

## 2013-10-04 NOTE — ED Notes (Signed)
Per pt, has been having pelvic discomfort since Saturday, had some discharge this am-has appt with PCP on 17th but wanted to be worked up for STD

## 2013-10-04 NOTE — ED Provider Notes (Signed)
CSN: [id number]     Arrival date & time 10/04/13  1251 History   First MD Initiated Contact with Patient 10/04/13 1509     Chief Complaint  Patient presents with  . Pelvic Pain   (Consider location/radiation/quality/duration/timing/severity/associated sxs/prior Treatment) HPI  47 year old female presents complaining of pelvic discomfort. Patient reports gradual onset of low abnormal discomfort for the past 5 days. Describe pain as a dull sensation to her low abdomen, nonradiating, persistent and getting progressively worse. She noticed 4 days ago that she developed a whitish discharge, described as a "glob".  Which is new for her. She attended to douching with minimal improvement. The findings concerning for due to recent sexual activities with the partner 7 days ago. Nothing seemed to make symptoms better or worse. She felt her symptom is similar to a prior yeast infection that she had in the past. She denies any prior history of STD. She denies any pain with sexual activity. She denies fever, chills, nausea, vomiting, diarrhea, new chest pain or shortness of breath (although she does report having vague cp/sob, not new, sts it's from smoking).  She also denies any dysuria, having back pain, hematuria, vaginal bleeding, rash. She is here with concern for possible STD.    Past Medical History  Diagnosis Date  . Hypertension   . Anemia   . Fibroids   . Thyroid goiter    Past Surgical History  Procedure Laterality Date  . Ankle surgery Right 2009    broken ankle   . Tubal ligation    . Replacement total knee Right 2012   Family History  Problem Relation Age of Onset  . Hypertension Mother   . Hypertension Father   . Cancer Maternal Grandmother     ? lung   History  Substance Use Topics  . Smoking status: Current Every Day Smoker -- 0.50 packs/day for 20 years    Types: Cigarettes  . Smokeless tobacco: Never Used  . Alcohol Use: 1.0 oz/week    2 drink(s) per week     Comment:  occasionally once per month   OB History   Grav Para Term Preterm Abortions TAB SAB Ect Mult Living   4 2 2  2 1 1   2      Review of Systems  Constitutional: Negative for fever.  Gastrointestinal: Positive for abdominal pain.  Genitourinary: Positive for vaginal discharge and pelvic pain. Negative for dysuria, frequency, hematuria, flank pain, vaginal bleeding, genital sores and vaginal pain.  Musculoskeletal: Negative for back pain.  Skin: Negative for rash and wound.  Neurological: Negative for headaches.  All other systems reviewed and are negative.    Allergies  Review of patient's allergies indicates no known allergies.  Home Medications   Current Outpatient Rx  Name  Route  Sig  Dispense  Refill  . cyanocobalamin (,VITAMIN B-12,) 1000 MCG/ML injection      Monthly injection   1 mL   0   . HYDROcodone-acetaminophen (NORCO/VICODIN) 5-325 MG per tablet      TAKE ONE TABLET BY MOUTH EVERY 6 HOURS AS NEEDED FOR PAIN   60 tablet   0   . ibuprofen (ADVIL,MOTRIN) 600 MG tablet   Oral   Take 1 tablet (600 mg total) by mouth every 6 (six) hours as needed for pain.   30 tablet   0    BP 148/102  Pulse 105  Temp(Src) 98.2 F (36.8 C)  Resp 18  SpO2 100%  LMP 09/20/2013 Physical Exam  Nursing note and vitals reviewed. Constitutional: She appears well-developed and well-nourished. No distress.  HENT:  Head: Normocephalic and atraumatic.  Eyes: Conjunctivae are normal.  Neck: Normal range of motion. Neck supple.  Cardiovascular: Normal rate and regular rhythm.   Pulmonary/Chest: Effort normal and breath sounds normal. She exhibits no tenderness.  Abdominal: Soft. There is no tenderness. Hernia confirmed negative in the right inguinal area and confirmed negative in the left inguinal area.  Genitourinary: Uterus normal. There is no rash or lesion on the right labia. There is no rash or lesion on the left labia. Cervix exhibits motion tenderness. Cervix exhibits no  discharge. Right adnexum displays no mass and no tenderness. Left adnexum displays tenderness. Left adnexum displays no mass. No erythema, tenderness or bleeding around the vagina. Vaginal discharge found.  Chaperone present:    Lymphadenopathy:       Right: No inguinal adenopathy present.       Left: No inguinal adenopathy present.    ED Course  Procedures (including critical care time)  4:37 PM Pt with pelvic pain, is sexually active.  Has vaginal discharge, has CMT on exam, albeit mild.  Will empirically treat for suspect STI with rocephin/zithromax.  Pt request and agreed.  5:15 PM Evidence of yeast infection on wet prep.  Fluconazole given.  Recommend abstaining from sexual activities until sxs resolved.  Recommend avoid douching.  Return precaution discussed.  Will also treat for BV.  Pt to avoid drinking alcohol when taking Flagyl.    Labs Review Labs Reviewed  WET PREP, GENITAL - Abnormal; Notable for the following:    Yeast Wet Prep HPF POC FEW (*)    Clue Cells Wet Prep HPF POC FEW (*)    WBC, Wet Prep HPF POC FEW (*)    All other components within normal limits  URINALYSIS, ROUTINE W REFLEX MICROSCOPIC - Abnormal; Notable for the following:    APPearance CLOUDY (*)    All other components within normal limits  GC/CHLAMYDIA PROBE AMP  POCT PREGNANCY, URINE   Imaging Review No results found.  EKG Interpretation   None       MDM   1. Cervicitis   2. Vaginal candidiasis   3. BV (bacterial vaginosis)    BP 148/102  Pulse 105  Temp(Src) 98.2 F (36.8 C)  Resp 18  SpO2 100%  LMP 09/20/2013  I have reviewed nursing notes and vital signs.   I reviewed available ER/hospitalization records thought the EMR     Fayrene Helper, [place] 10/04/13 1726

## 2013-10-05 NOTE — ED Provider Notes (Signed)
Medical screening examination/treatment/procedure(s) were performed by non-physician practitioner and as supervising physician I was immediately available for consultation/collaboration.   Junius Argyle, MD 10/05/13 1247

## 2013-10-06 ENCOUNTER — Ambulatory Visit: Payer: Medicare Other | Admitting: *Deleted

## 2013-10-07 ENCOUNTER — Ambulatory Visit (INDEPENDENT_AMBULATORY_CARE_PROVIDER_SITE_OTHER): Payer: Medicare Other | Admitting: *Deleted

## 2013-10-07 DIAGNOSIS — D518 Other vitamin B12 deficiency anemias: Secondary | ICD-10-CM | POA: Diagnosis not present

## 2013-10-07 MED ORDER — CYANOCOBALAMIN 1000 MCG/ML IJ SOLN
1000.0000 ug | Freq: Once | INTRAMUSCULAR | Status: AC
  Administered 2013-10-07: 1000 ug via INTRAMUSCULAR

## 2013-10-07 MED ORDER — CYANOCOBALAMIN 1000 MCG/ML IJ SOLN: 1000.0000 ug | Freq: Once | INTRAMUSCULAR | Status: DC

## 2013-10-13 ENCOUNTER — Telehealth: Payer: Self-pay | Admitting: Internal Medicine

## 2013-10-13 NOTE — Telephone Encounter (Signed)
Left message on voicemail to call office.  

## 2013-10-13 NOTE — Telephone Encounter (Signed)
Pt needs new rx hydrocodone °

## 2013-10-13 NOTE — Telephone Encounter (Signed)
pls call pt on [phone].

## 2013-10-14 ENCOUNTER — Ambulatory Visit (INDEPENDENT_AMBULATORY_CARE_PROVIDER_SITE_OTHER): Payer: Medicare Other | Admitting: Internal Medicine

## 2013-10-14 ENCOUNTER — Encounter: Payer: Self-pay | Admitting: Internal Medicine

## 2013-10-14 VITALS — BP 140/90 | HR 100 | Temp 97.9°F | Resp 20 | Wt 226.0 lb

## 2013-10-14 DIAGNOSIS — M7711 Lateral epicondylitis, right elbow: Secondary | ICD-10-CM

## 2013-10-14 DIAGNOSIS — J069 Acute upper respiratory infection, unspecified: Secondary | ICD-10-CM | POA: Diagnosis not present

## 2013-10-14 DIAGNOSIS — I10 Essential (primary) hypertension: Secondary | ICD-10-CM

## 2013-10-14 DIAGNOSIS — M7710 Lateral epicondylitis, unspecified elbow: Secondary | ICD-10-CM

## 2013-10-14 DIAGNOSIS — F17200 Nicotine dependence, unspecified, uncomplicated: Secondary | ICD-10-CM | POA: Diagnosis not present

## 2013-10-14 MED ORDER — METHYLPREDNISOLONE ACETATE 80 MG/ML IJ SUSP
80.0000 mg | Freq: Once | INTRAMUSCULAR | Status: AC
  Administered 2013-10-14: 80 mg via INTRAMUSCULAR

## 2013-10-14 MED ORDER — HYDROCODONE-HOMATROPINE 5-1.5 MG/5ML PO SYRP: 5.0000 mL | ORAL_SOLUTION | Freq: Four times a day (QID) | ORAL | Status: AC | PRN

## 2013-10-14 NOTE — Telephone Encounter (Signed)
Pt here for an appointment today.  

## 2013-10-14 NOTE — Patient Instructions (Signed)
Smoking tobacco is very bad for your health. You should stop smoking immediately.  Acute bronchitis symptoms for less than 10 days are generally not helped by antibiotics.  Take over-the-counter expectorants and cough medications such as  Mucinex DM.  Call if there is no improvement in 5 to 7 days or if he developed worsening cough, fever, or new symptoms, such as shortness of breath or chest pain.Lateral Epicondylitis (Tennis Elbow) with Rehab Lateral epicondylitis involves inflammation and pain around the outer portion of the elbow. The pain is caused by inflammation of the tendons in the forearm that bring back (extend) the wrist. Lateral epicondylittis is also called tennis elbow, because it is very common in tennis players. However, it may occur in any individual who extends the wrist repetitively. If lateral epicondylitis is left untreated, it may become a chronic problem. SYMPTOMS   Pain, tenderness, and inflammation on the outer (lateral) side of the elbow.  Pain or weakness with gripping activities.  Pain that increases with wrist twisting motions (playing tennis, using a screwdriver, opening a door or a jar).  Pain with lifting objects, including a coffee cup. CAUSES  Lateral epicondylitis is caused by inflammation of the tendons that extend the wrist. Causes of injury may include:  Repetitive stress and strain on the muscles and tendons that extend the wrist.  Sudden change in activity level or intensity.  Incorrect grip in racquet sports.  Incorrect grip size of racquet (often too large).  Incorrect hitting position or technique (usually backhand, leading with the elbow).  Using a racket that is too heavy. RISK INCREASES WITH:  Sports or occupations that require repetitive and/or strenuous forearm and wrist movements (tennis, squash, racquetball, carpentry).  Poor wrist and forearm strength and flexibility.  Failure to warm up properly before activity.  Resuming  activity before healing, rehabilitation, and conditioning are complete. PREVENTION   Warm up and stretch properly before activity.  Maintain physical fitness:  Strength, flexibility, and endurance.  Cardiovascular fitness.  Wear and use properly fitted equipment.  Learn and use proper technique and have a coach correct improper technique.  Wear a tennis elbow (counterforce) brace. PROGNOSIS  The course of this condition depends on the degree of the injury. If treated properly, acute cases (symptoms lasting less than 4 weeks) are often resolved in 2 to 6 weeks. Chronic (longer lasting cases) often resolve in 3 to 6 months, but may require physical therapy. RELATED COMPLICATIONS   Frequently recurring symptoms, resulting in a chronic problem. Properly treating the problem the first time decreases frequency of recurrence.  Chronic inflammation, scarring tendon degeneration, and partial tendon tear, requiring surgery.  Delayed healing or resolution of symptoms. TREATMENT  Treatment first involves the use of ice and medicine, to reduce pain and inflammation. Strengthening and stretching exercises may help reduce discomfort, if performed regularly. These exercises may be performed at home, if the condition is an acute injury. Chronic cases may require a referral to a physical therapist for evaluation and treatment. Your caregiver may advise a corticosteroid injection, to help reduce inflammation. Rarely, surgery is needed. MEDICATION  If pain medicine is needed, nonsteroidal anti-inflammatory medicines (aspirin and ibuprofen), or other minor pain relievers (acetaminophen), are often advised.  Do not take pain medicine for 7 days before surgery.  Prescription pain relievers may be given, if your caregiver thinks they are needed. Use only as directed and only as much as you need.  Corticosteroid injections may be recommended. These injections should be reserved only for  the most severe  cases, because they can only be given a certain number of times. HEAT AND COLD  Cold treatment (icing) should be applied for 10 to 15 minutes every 2 to 3 hours for inflammation and pain, and immediately after activity that aggravates your symptoms. Use ice packs or an ice massage.  Heat treatment may be used before performing stretching and strengthening activities prescribed by your caregiver, physical therapist, or athletic trainer. Use a heat pack or a warm water soak. SEEK MEDICAL CARE IF: Symptoms get worse or do not improve in 2 weeks, despite treatment. EXERCISES  RANGE OF MOTION (ROM) AND STRETCHING EXERCISES - Epicondylitis, Lateral (Tennis Elbow) These exercises may help you when beginning to rehabilitate your injury. Your symptoms may go away with or without further involvement from your physician, physical therapist or athletic trainer. While completing these exercises, remember:   Restoring tissue flexibility helps normal motion to return to the joints. This allows healthier, less painful movement and activity.  An effective stretch should be held for at least 30 seconds.  A stretch should never be painful. You should only feel a gentle lengthening or release in the stretched tissue. RANGE OF MOTION  Wrist Flexion, Active-Assisted  Extend your right / left elbow with your fingers pointing down.*  Gently pull the back of your hand towards you, until you feel a gentle stretch on the top of your forearm.  Hold this position for __________ seconds. Repeat __________ times. Complete this exercise __________ times per day.  *If directed by your physician, physical therapist or athletic trainer, complete this stretch with your elbow bent, rather than extended. RANGE OF MOTION  Wrist Extension, Active-Assisted  Extend your right / left elbow and turn your palm upwards.*  Gently pull your palm and fingertips back, so your wrist extends and your fingers point more toward the  ground.  You should feel a gentle stretch on the inside of your forearm.  Hold this position for __________ seconds. Repeat __________ times. Complete this exercise __________ times per day. *If directed by your physician, physical therapist or athletic trainer, complete this stretch with your elbow bent, rather than extended. STRETCH - Wrist Flexion  Place the back of your right / left hand on a tabletop, leaving your elbow slightly bent. Your fingers should point away from your body.  Gently press the back of your hand down onto the table by straightening your elbow. You should feel a stretch on the top of your forearm.  Hold this position for __________ seconds. Repeat __________ times. Complete this stretch __________ times per day.  STRETCH  Wrist Extension   Place your right / left fingertips on a tabletop, leaving your elbow slightly bent. Your fingers should point backwards.  Gently press your fingers and palm down onto the table by straightening your elbow. You should feel a stretch on the inside of your forearm.  Hold this position for __________ seconds. Repeat __________ times. Complete this stretch __________ times per day.  STRENGTHENING EXERCISES - Epicondylitis, Lateral (Tennis Elbow) These exercises may help you when beginning to rehabilitate your injury. They may resolve your symptoms with or without further involvement from your physician, physical therapist or athletic trainer. While completing these exercises, remember:   Muscles can gain both the endurance and the strength needed for everyday activities through controlled exercises.  Complete these exercises as instructed by your physician, physical therapist or athletic trainer. Increase the resistance and repetitions only as guided.  You may experience  muscle soreness or fatigue, but the pain or discomfort you are trying to eliminate should never worsen during these exercises. If this pain does get worse, stop  and make sure you are following the directions exactly. If the pain is still present after adjustments, discontinue the exercise until you can discuss the trouble with your caregiver. STRENGTH Wrist Flexors  Sit with your right / left forearm palm-up and fully supported on a table or countertop. Your elbow should be resting below the height of your shoulder. Allow your wrist to extend over the edge of the surface.  Loosely holding a __________ weight, or a piece of rubber exercise band or tubing, slowly curl your hand up toward your forearm.  Hold this position for __________ seconds. Slowly lower the wrist back to the starting position in a controlled manner. Repeat __________ times. Complete this exercise __________ times per day.  STRENGTH  Wrist Extensors  Sit with your right / left forearm palm-down and fully supported on a table or countertop. Your elbow should be resting below the height of your shoulder. Allow your wrist to extend over the edge of the surface.  Loosely holding a __________ weight, or a piece of rubber exercise band or tubing, slowly curl your hand up toward your forearm.  Hold this position for __________ seconds. Slowly lower the wrist back to the starting position in a controlled manner. Repeat __________ times. Complete this exercise __________ times per day.  STRENGTH - Ulnar Deviators  Stand with a ____________________ weight in your right / left hand, or sit while holding a rubber exercise band or tubing, with your healthy arm supported on a table or countertop.  Move your wrist, so that your pinkie travels toward your forearm and your thumb moves away from your forearm.  Hold this position for __________ seconds and then slowly lower the wrist back to the starting position. Repeat __________ times. Complete this exercise __________ times per day STRENGTH - Radial Deviators  Stand with a ____________________ weight in your right / left hand, or sit while  holding a rubber exercise band or tubing, with your injured arm supported on a table or countertop.  Raise your hand upward in front of you or pull up on the rubber tubing.  Hold this position for __________ seconds and then slowly lower the wrist back to the starting position. Repeat __________ times. Complete this exercise __________ times per day. STRENGTH  Forearm Supinators   Sit with your right / left forearm supported on a table, keeping your elbow below shoulder height. Rest your hand over the edge, palm down.  Gently grip a hammer or a soup ladle.  Without moving your elbow, slowly turn your palm and hand upward to a "thumbs-up" position.  Hold this position for __________ seconds. Slowly return to the starting position. Repeat __________ times. Complete this exercise __________ times per day.  STRENGTH  Forearm Pronators   Sit with your right / left forearm supported on a table, keeping your elbow below shoulder height. Rest your hand over the edge, palm up.  Gently grip a hammer or a soup ladle.  Without moving your elbow, slowly turn your palm and hand upward to a "thumbs-up" position.  Hold this position for __________ seconds. Slowly return to the starting position. Repeat __________ times. Complete this exercise __________ times per day.  STRENGTH - Grip  Grasp a tennis ball, a dense sponge, or a large, rolled sock in your hand.  Squeeze as hard as you  can, without increasing any pain.  Hold this position for __________ seconds. Release your grip slowly. Repeat __________ times. Complete this exercise __________ times per day.  STRENGTH - Elbow Extensors, Isometric  Stand or sit upright, on a firm surface. Place your right / left arm so that your palm faces your stomach, and it is at the height of your waist.  Place your opposite hand on the underside of your forearm. Gently push up as your right / left arm resists. Push as hard as you can with both arms, without  causing any pain or movement at your right / left elbow. Hold this stationary position for __________ seconds. Gradually release the tension in both arms. Allow your muscles to relax completely before repeating. Document Released: 12/08/2005 Document Revised: 03/01/2012 Document Reviewed: 03/22/2009 [facility] Patient Information 2014 [place], [place].

## 2013-10-14 NOTE — Progress Notes (Signed)
Subjective:    Patient ID: [name], female    DOB: 1966-07-23, 47 y.o.   MRN: [id number]  HPI  47 year old patient who has a history of ongoing tobacco use. She presents with a one-week history of increasing head and chest congestion and productive cough. She also complains persistent right lateral elbow pain. Additionally she complains of some mild left knee discomfort. She has had a recent hydrocodone refill. An early refill request  today was declined  Past Medical History  Diagnosis Date  . Hypertension   . Anemia   . Fibroids   . Thyroid goiter     History   Social History  . Marital Status: Single    Spouse Name: N/A    Number of Children: 2  . Years of Education: N/A   Occupational History  . disabled    Social History Main Topics  . Smoking status: Current Every Day Smoker -- 0.50 packs/day for 20 years    Types: Cigarettes  . Smokeless tobacco: Never Used  . Alcohol Use: 1.0 oz/week    2 drink(s) per week     Comment: occasionally once per month  . Drug Use: No  . Sexual Activity: Yes    Birth Control/ Protection: Surgical   Other Topics Concern  . Not on file   Social History Narrative   Applying for disability, previously worked in [employer]    Past Surgical History  Procedure Laterality Date  . Ankle surgery Right 2009    broken ankle   . Tubal ligation    . Replacement total knee Right 2012    Family History  Problem Relation Age of Onset  . Hypertension Mother   . Hypertension Father   . Cancer Maternal Grandmother     ? lung    No Known Allergies  Current Outpatient Prescriptions on File Prior to Visit  Medication Sig Dispense Refill  . HYDROcodone-acetaminophen (NORCO/VICODIN) 5-325 MG per tablet TAKE ONE TABLET BY MOUTH EVERY 6 HOURS AS NEEDED FOR PAIN  60 tablet  0  . ibuprofen (ADVIL,MOTRIN) 600 MG tablet Take 1 tablet (600 mg total) by mouth every 6 (six) hours as needed for pain.  30 tablet  0   No current  facility-administered medications on file prior to visit.    BP 140/90  Pulse 100  Temp(Src) 97.9 F (36.6 C) (Oral)  Resp 20  Wt 226 lb (102.513 kg)  BMI 36.49 kg/m2  SpO2 99%  LMP 09/20/2013      Review of Systems  Constitutional: Negative.   HENT: Positive for rhinorrhea and sinus pressure. Negative for congestion, dental problem, hearing loss, sore throat and tinnitus.   Eyes: Negative for pain, discharge and visual disturbance.  Respiratory: Positive for cough. Negative for shortness of breath.   Cardiovascular: Negative for chest pain, palpitations and leg swelling.  Gastrointestinal: Negative for nausea, vomiting, abdominal pain, diarrhea, constipation, blood in stool and abdominal distention.  Genitourinary: Negative for dysuria, urgency, frequency, hematuria, flank pain, vaginal bleeding, vaginal discharge, difficulty urinating, vaginal pain and pelvic pain.  Musculoskeletal: Positive for arthralgias. Negative for gait problem and joint swelling.  Skin: Negative for rash.  Neurological: Negative for dizziness, syncope, speech difficulty, weakness, numbness and headaches.  Hematological: Negative for adenopathy.  Psychiatric/Behavioral: Negative for behavioral problems, dysphoric mood and agitation. The patient is not nervous/anxious.        Objective:   Physical Exam  Constitutional: She is oriented to person, place, and time. She appears well-developed and well-nourished.  HENT:  Head: Normocephalic.  Right Ear: External ear normal.  Left Ear: External ear normal.  Mild erythema of the oropharynx  Eyes: Conjunctivae and EOM are normal. Pupils are equal, round, and reactive to light.  Neck: Normal range of motion. Neck supple. No thyromegaly present.  Cardiovascular: Normal rate, regular rhythm, normal heart sounds and intact distal pulses.   Pulmonary/Chest: Effort normal and breath sounds normal.  Abdominal: Soft. Bowel sounds are normal. She exhibits no mass.  There is no tenderness.  Musculoskeletal: Normal range of motion.  Left knee. No signs of active inflammation Right elbow. Considerable tenderness over the lateral aspect  Lymphadenopathy:    She has no cervical adenopathy.  Neurological: She is alert and oriented to person, place, and time.  Skin: Skin is warm and dry. No rash noted.  Psychiatric: She has a normal mood and affect. Her behavior is normal.          Assessment & Plan:   Tobacco use disorder Acute viral bronchitis with cough  Right lateral epicondylitis.  Procedure note. After informed consent, the right lateral elbow was prepped with alcohol, and 2 cc of lidocaine and 1CC of 80 mg of Depo-Medrol were injected about the tendon insertion site. Patient tolerated procedure well and postprocedure the local tenderness to palpation resolved. A Band-Aid applied

## 2013-10-17 ENCOUNTER — Telehealth: Payer: Self-pay | Admitting: Internal Medicine

## 2013-10-17 MED ORDER — BENZONATATE 200 MG PO CAPS: 200.0000 mg | ORAL_CAPSULE | Freq: Three times a day (TID) | ORAL | Status: DC | PRN

## 2013-10-17 NOTE — Telephone Encounter (Signed)
Please see message and advise 

## 2013-10-17 NOTE — Telephone Encounter (Signed)
Pt states she was given an rx for HYDROcodone-homatropine (HYCODAN) 5-1.5 MG/5ML syrup last week.  However, her prescription plan does not cover that brand, requesting an alternative medication.

## 2013-10-17 NOTE — Telephone Encounter (Signed)
Spoke to pt's son Lyda Jester told him to tell her new Rx sent to pharmacy for her. Lyda Jester said he will tell her.

## 2013-10-17 NOTE — Telephone Encounter (Signed)
Generic Tessalon 200 mg #30 one every 8 hours as needed for cough

## 2013-10-25 DIAGNOSIS — IMO0002 Reserved for concepts with insufficient information to code with codable children: Secondary | ICD-10-CM | POA: Diagnosis not present

## 2013-10-25 DIAGNOSIS — Z9889 Other specified postprocedural states: Secondary | ICD-10-CM | POA: Diagnosis not present

## 2013-10-28 ENCOUNTER — Telehealth: Payer: Self-pay | Admitting: Internal Medicine

## 2013-10-28 MED ORDER — HYDROCODONE-ACETAMINOPHEN 5-325 MG PO TABS: ORAL_TABLET | ORAL | Status: DC

## 2013-10-28 MED ORDER — BENZONATATE 200 MG PO CAPS: 200.0000 mg | ORAL_CAPSULE | Freq: Three times a day (TID) | ORAL | Status: DC | PRN

## 2013-10-28 NOTE — Telephone Encounter (Signed)
Left message with son to call office. Rx for Tessalon capsules sent to pharmacy and Rx for Hydrocodone printed and signed put at front desk for pickup.

## 2013-10-28 NOTE — Telephone Encounter (Signed)
Pt states the rx benzonatate (TESSALON) 200 MG capsule was sent to wrong pharm. Can you send to [facility]. Pt also needs to make her main pharm this [facility] [address]  Pt also would like new rx for HYDROcodone-acetaminophen (NORCO/VICODIN) 5-325 MG per tablet

## 2013-10-28 NOTE — Telephone Encounter (Signed)
Left message on voicemail to call office.  

## 2013-10-31 NOTE — Telephone Encounter (Signed)
Pt came and picked Rx up.

## 2013-11-03 ENCOUNTER — Encounter: Payer: Self-pay | Admitting: Internal Medicine

## 2013-11-03 ENCOUNTER — Ambulatory Visit: Payer: Medicare Other | Admitting: Internal Medicine

## 2013-11-21 ENCOUNTER — Inpatient Hospital Stay (HOSPITAL_COMMUNITY)
Admission: AD | Admit: 2013-11-21 | Discharge: 2013-11-21 | Disposition: A | Discharge status: Home / Self Care | Payer: Medicare Other | Source: Ambulatory Visit | Attending: Obstetrics & Gynecology | Admitting: Obstetrics & Gynecology

## 2013-11-21 ENCOUNTER — Encounter (HOSPITAL_COMMUNITY): Payer: Self-pay | Admitting: *Deleted

## 2013-11-21 DIAGNOSIS — N760 Acute vaginitis: Secondary | ICD-10-CM | POA: Insufficient documentation

## 2013-11-21 DIAGNOSIS — B3731 Acute candidiasis of vulva and vagina: Secondary | ICD-10-CM | POA: Insufficient documentation

## 2013-11-21 DIAGNOSIS — R109 Unspecified abdominal pain: Secondary | ICD-10-CM | POA: Insufficient documentation

## 2013-11-21 DIAGNOSIS — A499 Bacterial infection, unspecified: Secondary | ICD-10-CM | POA: Insufficient documentation

## 2013-11-21 DIAGNOSIS — B9689 Other specified bacterial agents as the cause of diseases classified elsewhere: Secondary | ICD-10-CM | POA: Insufficient documentation

## 2013-11-21 DIAGNOSIS — R300 Dysuria: Secondary | ICD-10-CM | POA: Diagnosis not present

## 2013-11-21 DIAGNOSIS — D259 Leiomyoma of uterus, unspecified: Secondary | ICD-10-CM | POA: Diagnosis not present

## 2013-11-21 DIAGNOSIS — B373 Candidiasis of vulva and vagina: Secondary | ICD-10-CM | POA: Insufficient documentation

## 2013-11-21 HISTORY — DX: Unspecified injury of unspecified lower leg, initial encounter: S89.90XA

## 2013-11-21 LAB — URINALYSIS, ROUTINE W REFLEX MICROSCOPIC
Bilirubin Urine: NEGATIVE
Glucose, UA: NEGATIVE mg/dL
Ketones, ur: NEGATIVE mg/dL
Protein, ur: NEGATIVE mg/dL
Urobilinogen, UA: 0.2 mg/dL (ref 0.0–1.0)
pH: 6 (ref 5.0–8.0)

## 2013-11-21 LAB — WET PREP, GENITAL
Trich, Wet Prep: NONE SEEN
Yeast Wet Prep HPF POC: NONE SEEN

## 2013-11-21 LAB — CBC
HCT: 28.7 % — ABNORMAL LOW (ref 36.0–46.0)
MCH: 24.6 pg — ABNORMAL LOW (ref 26.0–34.0)
MCV: 77.6 fL — ABNORMAL LOW (ref 78.0–100.0)
Platelets: 289 10*3/uL (ref 150–400)
RDW: 19.6 % — ABNORMAL HIGH (ref 11.5–15.5)
WBC: 5.4 10*3/uL (ref 4.0–10.5)

## 2013-11-21 LAB — URINE MICROSCOPIC-ADD ON

## 2013-11-21 MED ORDER — FLUCONAZOLE 150 MG PO TABS
150.0000 mg | ORAL_TABLET | ORAL | Status: AC
  Administered 2013-11-21: 150 mg via ORAL
  Filled 2013-11-21: qty 1

## 2013-11-21 MED ORDER — IBUPROFEN 600 MG PO TABS: 600.0000 mg | ORAL_TABLET | Freq: Four times a day (QID) | ORAL | Status: DC | PRN

## 2013-11-21 MED ORDER — METRONIDAZOLE 0.75 % VA GEL: 1.0000 | Freq: Every day | VAGINAL | Status: DC

## 2013-11-21 MED ORDER — METRONIDAZOLE 500 MG PO TABS: 500.0000 mg | ORAL_TABLET | Freq: Two times a day (BID) | ORAL | Status: DC

## 2013-11-21 NOTE — MAU Note (Signed)
C/o intermittent abdominal pain and vaginal odor for past month; pain increased with urination and speckles of blood;

## 2013-11-21 NOTE — MAU Provider Note (Signed)

## 2013-11-21 NOTE — MAU Provider Note (Signed)
Chief Complaint: Abdominal Pain   First Provider Initiated Contact with Patient 11/21/13 1217     SUBJECTIVE HPI: [name] is a 47 y.o. [id number] who presents to maternity admissions reporting burning with urination, vaginal itching, and abdominal pain described as cramping across her lower abdomen. She has had all these symptoms ~1 week but they are worsening.  She has an appointment with Gyn clinic on Dec 11 but did not feel she could wait to have her symptoms treated.  She reports frequent douching to r/t vaginal discharge and odor, and frequent treatment in the last year for bacterial vaginosis.  She currently describes her vaginal discharge as thick, white, with clumps that fall into the toilet. Patient's last menstrual period was 11/12/2013.  She has had occasional spotting since then which is not unusual for her.  She denies h/a, dizziness, n/v, or fever/chills.     Past Medical History  Diagnosis Date  . Hypertension   . Anemia   . Fibroids   . Thyroid goiter   . Knee joint injury    Past Surgical History  Procedure Laterality Date  . Ankle surgery Right 2009    broken ankle   . Tubal ligation    . Replacement total knee Right 2012  . Arthi     History   Social History  . Marital Status: Single    Spouse Name: N/A    Number of Children: 2  . Years of Education: N/A   Occupational History  . disabled    Social History Main Topics  . Smoking status: Current Every Day Smoker -- 0.50 packs/day for 20 years    Types: Cigarettes  . Smokeless tobacco: Never Used  . Alcohol Use: 1.0 oz/week    2 drink(s) per week     Comment: occasionally once per month  . Drug Use: No  . Sexual Activity: Yes    Birth Control/ Protection: Surgical   Other Topics Concern  . Not on file   Social History Narrative   Applying for disability, previously worked in [employer]   No current facility-administered medications on file prior to encounter.   Current Outpatient  Prescriptions on File Prior to Encounter  Medication Sig Dispense Refill  . HYDROcodone-acetaminophen (NORCO/VICODIN) 5-325 MG per tablet TAKE ONE TABLET BY MOUTH EVERY 6 HOURS AS NEEDED FOR PAIN  60 tablet  0   No Known Allergies  ROS: Pertinent items in HPI  OBJECTIVE Blood pressure 159/106, pulse 83, temperature 98.4 F (36.9 C), temperature source Oral, resp. rate 20, height 5\' 6"  (1.676 m), weight 101.152 kg (223 lb), last menstrual period 11/12/2013.  Patient Vitals for the past 24 hrs:  BP Temp Temp src Pulse Resp Height Weight  11/21/13 1343 170/97 mmHg - - 71 18 - -  11/21/13 1137 159/106 mmHg 98.4 F (36.9 C) Oral 83 20 5\' 6"  (1.676 m) 101.152 kg (223 lb)   GENERAL: Well-developed, well-nourished female in no acute distress.  HEENT: Normocephalic HEART: normal rate RESP: normal effort ABDOMEN: Soft, non-tender EXTREMITIES: Nontender, no edema NEURO: Alert and oriented Pelvic exam: Cervix pink, visually closed, without lesion, moderate amount thick white discharge with fishy odor, vaginal walls and external genitalia normal Bimanual exam: Cervix 0/long/high, firm, anterior, neg CMT, uterus mildly tender, slightly enlarged, adnexa difficult to palpate due to body habitus, but no palpable masses or tenderness   LAB RESULTS Results for orders placed during the hospital encounter of 11/21/13 (from the past 24 hour(s))  URINALYSIS, ROUTINE W  REFLEX MICROSCOPIC     Status: Abnormal   Collection Time    11/21/13 11:20 AM      Result Value Range   Color, Urine YELLOW  YELLOW   APPearance CLEAR  CLEAR   Specific Gravity, Urine 1.025  1.005 - 1.030   pH 6.0  5.0 - 8.0   Glucose, UA NEGATIVE  NEGATIVE mg/dL   Hgb urine dipstick SMALL (*) NEGATIVE   Bilirubin Urine NEGATIVE  NEGATIVE   Ketones, ur NEGATIVE  NEGATIVE mg/dL   Protein, ur NEGATIVE  NEGATIVE mg/dL   Urobilinogen, UA 0.2  0.0 - 1.0 mg/dL   Nitrite NEGATIVE  NEGATIVE   Leukocytes, UA SMALL (*) NEGATIVE  URINE  MICROSCOPIC-ADD ON     Status: None   Collection Time    11/21/13 11:20 AM      Result Value Range   Squamous Epithelial / LPF RARE  RARE   WBC, UA 0-2  <3 WBC/hpf   Bacteria, UA RARE  RARE  POCT PREGNANCY, URINE     Status: None   Collection Time    11/21/13 11:30 AM      Result Value Range   Preg Test, Ur NEGATIVE  NEGATIVE    ASSESSMENT 1. Uterine fibroid   2. Bacterial vaginosis   3. Vaginal candidiasis     PLAN Discharge home Urine sent for culture Diflucan 150 mg x1 dose given in MAU Metrogel Q HS x5 nights, then Flagyl 500 mg BID if symptoms persist.   Recommend probiotics, including eating yogurt daily. Eliminate douching, perfumed soaps. F/U in Gyn clinic as scheduled December 11 F/U with primary care r/t HTN.  Take medications as scheduled Return to MAU as needed    Medication List    ASK your doctor about these medications       HYDROcodone-acetaminophen 5-325 MG per tablet  Commonly known as:  NORCO/VICODIN  TAKE ONE TABLET BY MOUTH EVERY 6 HOURS AS NEEDED FOR PAIN         Sharen Counter Certified Nurse-Midwife 11/21/2013  12:54 PM

## 2013-11-22 LAB — URINE CULTURE: Colony Count: 7000

## 2013-12-01 ENCOUNTER — Encounter: Payer: Medicare Other | Admitting: Obstetrics & Gynecology

## 2013-12-01 MED ORDER — METRONIDAZOLE 500 MG PO TABS: 500.0000 mg | ORAL_TABLET | Freq: Two times a day (BID) | ORAL | Status: DC

## 2013-12-01 MED ORDER — METRONIDAZOLE 0.75 % VA GEL: 1.0000 | Freq: Every day | VAGINAL | Status: DC

## 2013-12-01 MED ORDER — FLUCONAZOLE 150 MG PO TABS: 150.0000 mg | ORAL_TABLET | Freq: Once | ORAL | Status: DC

## 2013-12-01 NOTE — Progress Notes (Signed)
This encounter was created in error - please disregard.  This encounter was created in error - please disregard.

## 2013-12-21 ENCOUNTER — Encounter (HOSPITAL_COMMUNITY): Payer: Self-pay | Admitting: Pharmacy Technician

## 2013-12-26 ENCOUNTER — Other Ambulatory Visit (HOSPITAL_COMMUNITY): Payer: Self-pay | Admitting: Orthopedic Surgery

## 2013-12-26 NOTE — Progress Notes (Signed)
Medical clearance note dr Burnice Logan on chart

## 2013-12-26 NOTE — Patient Instructions (Addendum)
[place]  12/26/2013   Your procedure is scheduled on: Tuesday January 13th  Report to [facility] at 1000 AM.  Call this number if you have problems the morning of surgery [phone]   Remember:   Do not eat food or drink liquids :After Midnight.     Take these medicines the morning of surgery with A SIP OF WATER: percocet if needed                                [place] may not have any metal on your body including hair pins and piercings  Do not wear jewelry, make-up.  Do not wear lotions, powders, or perfumes. You may wear deodorant.   Men may shave face and neck.  Do not bring valuables to the hospital. [place].  Contacts, dentures or bridgework may not be worn into surgery.  Leave suitcase in the car. After surgery it may be brought to your room.  For patients admitted to the hospital, checkout time is 11:00 AM the day of discharge.     Please read over the following fact sheets that you were given: mrsa information, blood afct sheet, incentive spirometer sheet, [place] preparing for surgery sheet  Call Zelphia Cairo RN pre op nurse if needed [phone]    [place].  PATIENT SIGNATURE___________________________________________  NURSE SIGNATURE_____________________________________________

## 2013-12-27 ENCOUNTER — Ambulatory Visit (HOSPITAL_COMMUNITY)
Admission: RE | Admit: 2013-12-27 | Discharge: 2013-12-27 | Disposition: A | Discharge status: Home / Self Care | Payer: Medicare Other | Source: Ambulatory Visit | Attending: Surgical | Admitting: Surgical

## 2013-12-27 ENCOUNTER — Encounter (HOSPITAL_COMMUNITY): Payer: Self-pay

## 2013-12-27 ENCOUNTER — Encounter (HOSPITAL_COMMUNITY)
Admission: RE | Admit: 2013-12-27 | Discharge: 2013-12-27 | Disposition: A | Discharge status: Home / Self Care | Payer: Medicare Other | Source: Ambulatory Visit | Attending: Orthopedic Surgery | Admitting: Orthopedic Surgery

## 2013-12-27 DIAGNOSIS — Z01810 Encounter for preprocedural cardiovascular examination: Secondary | ICD-10-CM | POA: Insufficient documentation

## 2013-12-27 DIAGNOSIS — M1710 Unilateral primary osteoarthritis, unspecified knee: Secondary | ICD-10-CM | POA: Insufficient documentation

## 2013-12-27 DIAGNOSIS — Z01818 Encounter for other preprocedural examination: Secondary | ICD-10-CM | POA: Diagnosis not present

## 2013-12-27 DIAGNOSIS — Z87891 Personal history of nicotine dependence: Secondary | ICD-10-CM | POA: Diagnosis not present

## 2013-12-27 DIAGNOSIS — Z01812 Encounter for preprocedural laboratory examination: Secondary | ICD-10-CM | POA: Insufficient documentation

## 2013-12-27 DIAGNOSIS — R0982 Postnasal drip: Secondary | ICD-10-CM | POA: Insufficient documentation

## 2013-12-27 LAB — COMPREHENSIVE METABOLIC PANEL
ALT: 6 U/L (ref 0–35)
AST: 12 U/L (ref 0–37)
Albumin: 3.6 g/dL (ref 3.5–5.2)
Alkaline Phosphatase: 69 U/L (ref 39–117)
BUN: 17 mg/dL (ref 6–23)
CO2: 23 mEq/L (ref 19–32)
Calcium: 8.9 mg/dL (ref 8.4–10.5)
Chloride: 102 mEq/L (ref 96–112)
Creatinine, Ser: 0.84 mg/dL (ref 0.50–1.10)
GFR calc Af Amer: >90 mL/min (ref >90)
GFR calc non Af Amer: 81 mL/min — ABNORMAL LOW (ref >90)
Glucose, Bld: 92 mg/dL (ref 70–99)
Potassium: 4.4 mEq/L (ref 3.7–5.3)
Sodium: 137 mEq/L (ref 137–147)
Total Bilirubin: 0.3 mg/dL (ref 0.3–1.2)
Total Protein: 7.3 g/dL (ref 6.0–8.3)

## 2013-12-27 LAB — CBC
HEMATOCRIT: 28.1 % — AB (ref 36.0–46.0)
Hemoglobin: 8.8 g/dL — ABNORMAL LOW (ref 12.0–15.0)
MCH: 24.1 pg — ABNORMAL LOW (ref 26.0–34.0)
MCHC: 31.3 g/dL (ref 30.0–36.0)
MCV: 77 fL — ABNORMAL LOW (ref 78.0–100.0)
PLATELETS: 255 10*3/uL (ref 150–400)
RBC: 3.65 MIL/uL — ABNORMAL LOW (ref 3.87–5.11)
RDW: 20.5 % — ABNORMAL HIGH (ref 11.5–15.5)
WBC: 5.8 10*3/uL (ref 4.0–10.5)

## 2013-12-27 LAB — URINALYSIS, ROUTINE W REFLEX MICROSCOPIC
Bilirubin Urine: NEGATIVE
Glucose, UA: NEGATIVE mg/dL
Hgb urine dipstick: NEGATIVE
Ketones, ur: NEGATIVE mg/dL
Leukocytes, UA: NEGATIVE
Nitrite: NEGATIVE
Protein, ur: NEGATIVE mg/dL
Specific Gravity, Urine: 1.027 (ref 1.005–1.030)
Urobilinogen, UA: 0.2 mg/dL (ref 0.0–1.0)
pH: 6.5 (ref 5.0–8.0)

## 2013-12-27 LAB — HCG, SERUM, QUALITATIVE: Preg, Serum: NEGATIVE

## 2013-12-27 LAB — PROTIME-INR
INR: 0.95 (ref 0.00–1.49)
Prothrombin Time: 12.5 seconds (ref 11.6–15.2)

## 2013-12-27 LAB — APTT: aPTT: 27 seconds (ref 24–37)

## 2013-12-27 LAB — SURGICAL PCR SCREEN
MRSA, PCR: NEGATIVE
STAPHYLOCOCCUS AUREUS: NEGATIVE

## 2013-12-27 NOTE — Progress Notes (Signed)
Cbc faxed to dr Gladstone Lighter by epic

## 2014-01-01 NOTE — H&P (Signed)
TOTAL KNEE ADMISSION H&P  Patient is being admitted for left total knee arthroplasty.  Subjective:  Chief Complaint:left knee pain.  HPI: [name], 48 y.o. female, has a history of pain and functional disability in the left knee due to arthritis and has failed non-surgical conservative treatments for greater than 12 weeks to includeNSAID's and/or analgesics, corticosteriod injections and activity modification.  Onset of symptoms was gradual, starting 4 years ago with gradually worsening course since that time. The patient noted prior procedures on the knee to include  arthroscopy and menisectomy on the left knee(s).  Patient currently rates pain in the left knee(s) at 5 out of 10 with activity. Patient has night pain, worsening of pain with activity and weight bearing, pain that interferes with activities of daily living, pain with passive range of motion, crepitus and joint swelling.  Patient has evidence of periarticular osteophytes and joint space narrowing by imaging studies. There is no active infection.  Patient Active Problem List   Diagnosis Date Noted  . Abdominal pain 05/09/2013  . Uterine fibroid 05/09/2013  . Tobacco use disorder 04/12/2013  . OA (osteoarthritis) 10/28/2011  . SORE THROAT 01/30/2010  . ANEMIA, VITAMIN B12 DEFICIENCY 10/29/2009  . GOITER, TOXIC, MULTINODULAR 09/17/2009  . MORBID OBESITY 09/17/2009  . HYPERTENSION 09/17/2009  . Pain in soft tissues of limb 09/17/2009  . ANEMIA, HX OF 09/17/2009   Past Medical History  Diagnosis Date  . Anemia   . Fibroids   . Thyroid goiter   . Knee joint injury   . Hypertension     no current meds for htn    Past Surgical History  Procedure Laterality Date  . Ankle surgery Right 2009    broken ankle   . Tubal ligation    . Replacement total knee Right 2012  . Arthroscopic surgery to knee Bilateral      Current outpatient prescriptions: oxyCODONE-acetaminophen (PERCOCET) 10-325 MG per tablet, Take 1 tablet  by mouth every 4 (four) hours as needed for pain., Disp: , Rfl:   No Known Allergies  History  Substance Use Topics  . Smoking status: Current Every Day Smoker -- 0.50 packs/day for 20 years    Types: Cigarettes  . Smokeless tobacco: Never Used  . Alcohol Use: 1.0 oz/week    2 drink(s) per week     Comment: occasionally once per month    Family History  Problem Relation Age of Onset  . Hypertension Mother   . Hypertension Father   . Cancer Maternal Grandmother     ? lung     Review of Systems  Constitutional: Negative.   HENT: Negative for congestion, ear discharge, ear pain, hearing loss, nosebleeds, sore throat and tinnitus.   Eyes: Negative.   Respiratory: Negative.  Negative for stridor.   Cardiovascular: Negative.   Gastrointestinal: Negative.   Genitourinary: Negative.   Musculoskeletal: Positive for back pain and joint pain. Negative for falls, myalgias and neck pain.       Left knee pain  Skin: Negative.   Neurological: Positive for dizziness and headaches. Negative for tingling, tremors, sensory change, speech change, focal weakness, seizures and loss of consciousness.  Endo/Heme/Allergies: Negative.   Psychiatric/Behavioral: Negative.     Objective:  Physical Exam  Constitutional: She is oriented to person, place, and time. She appears well-developed and well-nourished. No distress.  HENT:  Head: Normocephalic and atraumatic.  Right Ear: External ear normal.  Left Ear: External ear normal.  Nose: Nose normal.  Mouth/Throat: Oropharynx  is clear and moist.  Eyes: Conjunctivae and EOM are normal.  Neck: Normal range of motion. Neck supple.  Cardiovascular: Normal rate, regular rhythm, normal heart sounds and intact distal pulses.   No murmur heard. Respiratory: Effort normal and breath sounds normal. No respiratory distress. She has no wheezes.  GI: Soft. Bowel sounds are normal. She exhibits no distension. There is no tenderness.  Musculoskeletal:        Right hip: Normal.       Left hip: Normal.       Right knee: Normal.       Left knee: She exhibits decreased range of motion and swelling. She exhibits no effusion and no erythema. Tenderness found. Medial joint line and lateral joint line tenderness noted.       Right lower leg: She exhibits no tenderness and no swelling.       Left lower leg: She exhibits no tenderness and no swelling.  Neurological: She is alert and oriented to person, place, and time. She has normal strength and normal reflexes. No sensory deficit.  Skin: No rash noted. She is not diaphoretic. No erythema.  Psychiatric: She has a normal mood and affect. Her behavior is normal.    Vitals Weight: 231 lb Height: 66 in Body Surface Area: 2.21 m Body Mass Index: 37.28 kg/m Pulse: 95 (Regular) BP: 137/116 (Sitting, Left Arm, Standard)  Imaging Review Plain radiographs demonstrate severe degenerative joint disease of the left knee(s). The overall alignment ismild valgus. The bone quality appears to be good for age and reported activity level.  Assessment/Plan:  End stage arthritis, left knee   The patient history, physical examination, clinical judgment of the provider and imaging studies are consistent with end stage degenerative joint disease of the left knee(s) and total knee arthroplasty is deemed medically necessary. The treatment options including medical management, injection therapy arthroscopy and arthroplasty were discussed at length. The risks and benefits of total knee arthroplasty were presented and reviewed. The risks due to aseptic loosening, infection, stiffness, patella tracking problems, thromboembolic complications and other imponderables were discussed. The patient acknowledged the explanation, agreed to proceed with the plan and consent was signed. Patient is being admitted for inpatient treatment for surgery, pain control, PT, OT, prophylactic antibiotics, VTE prophylaxis, progressive ambulation  and ADL's and discharge planning. The patient is planning to be discharged home with home health services      [place], [place]

## 2014-01-03 ENCOUNTER — Inpatient Hospital Stay (HOSPITAL_COMMUNITY)
Admission: RE | Admit: 2014-01-03 | Discharge: 2014-01-06 | DRG: 470 | Disposition: A | Discharge status: Home Health | Payer: Medicare Other | Source: Ambulatory Visit | Attending: Orthopedic Surgery | Admitting: Orthopedic Surgery

## 2014-01-03 ENCOUNTER — Encounter (HOSPITAL_COMMUNITY): Payer: Self-pay | Admitting: *Deleted

## 2014-01-03 ENCOUNTER — Inpatient Hospital Stay (HOSPITAL_COMMUNITY): Payer: Medicare Other | Admitting: Anesthesiology

## 2014-01-03 ENCOUNTER — Inpatient Hospital Stay (HOSPITAL_COMMUNITY): Payer: Medicare Other

## 2014-01-03 ENCOUNTER — Encounter (HOSPITAL_COMMUNITY): Payer: Medicare Other | Admitting: Anesthesiology

## 2014-01-03 ENCOUNTER — Encounter (HOSPITAL_COMMUNITY)
Admission: RE | Disposition: A | Discharge status: Home Health | Payer: Self-pay | Source: Ambulatory Visit | Attending: Orthopedic Surgery

## 2014-01-03 DIAGNOSIS — D259 Leiomyoma of uterus, unspecified: Secondary | ICD-10-CM | POA: Diagnosis not present

## 2014-01-03 DIAGNOSIS — M1712 Unilateral primary osteoarthritis, left knee: Secondary | ICD-10-CM

## 2014-01-03 DIAGNOSIS — E0520 Thyrotoxicosis with toxic multinodular goiter without thyrotoxic crisis or storm: Secondary | ICD-10-CM | POA: Diagnosis present

## 2014-01-03 DIAGNOSIS — Z471 Aftercare following joint replacement surgery: Secondary | ICD-10-CM | POA: Diagnosis not present

## 2014-01-03 DIAGNOSIS — Z96659 Presence of unspecified artificial knee joint: Secondary | ICD-10-CM | POA: Diagnosis not present

## 2014-01-03 DIAGNOSIS — Z6837 Body mass index (BMI) 37.0-37.9, adult: Secondary | ICD-10-CM

## 2014-01-03 DIAGNOSIS — D62 Acute posthemorrhagic anemia: Secondary | ICD-10-CM | POA: Diagnosis not present

## 2014-01-03 DIAGNOSIS — F17200 Nicotine dependence, unspecified, uncomplicated: Secondary | ICD-10-CM | POA: Diagnosis not present

## 2014-01-03 DIAGNOSIS — I10 Essential (primary) hypertension: Secondary | ICD-10-CM | POA: Diagnosis present

## 2014-01-03 DIAGNOSIS — D649 Anemia, unspecified: Secondary | ICD-10-CM | POA: Diagnosis not present

## 2014-01-03 DIAGNOSIS — Z8249 Family history of ischemic heart disease and other diseases of the circulatory system: Secondary | ICD-10-CM

## 2014-01-03 DIAGNOSIS — M1710 Unilateral primary osteoarthritis, unspecified knee: Principal | ICD-10-CM | POA: Diagnosis present

## 2014-01-03 DIAGNOSIS — Z96652 Presence of left artificial knee joint: Secondary | ICD-10-CM

## 2014-01-03 DIAGNOSIS — M25569 Pain in unspecified knee: Secondary | ICD-10-CM | POA: Diagnosis not present

## 2014-01-03 DIAGNOSIS — IMO0002 Reserved for concepts with insufficient information to code with codable children: Secondary | ICD-10-CM | POA: Diagnosis not present

## 2014-01-03 HISTORY — PX: TOTAL KNEE ARTHROPLASTY: SHX125

## 2014-01-03 LAB — HEMOGLOBIN AND HEMATOCRIT, BLOOD
HCT: 26.9 % — ABNORMAL LOW (ref 36.0–46.0)
Hemoglobin: 8.5 g/dL — ABNORMAL LOW (ref 12.0–15.0)

## 2014-01-03 SURGERY — ARTHROPLASTY, KNEE, TOTAL
Anesthesia: General | Site: Knee | Laterality: Left

## 2014-01-03 MED ORDER — CEFAZOLIN SODIUM 1-5 GM-% IV SOLN
1.0000 g | Freq: Four times a day (QID) | INTRAVENOUS | Status: AC
  Administered 2014-01-03 – 2014-01-04 (×2): 1 g via INTRAVENOUS
  Filled 2014-01-03 (×2): qty 50

## 2014-01-03 MED ORDER — SODIUM CHLORIDE 0.9 % IJ SOLN
INTRAMUSCULAR | Status: DC | PRN
  Administered 2014-01-03: 14:00:00

## 2014-01-03 MED ORDER — ALUM & MAG HYDROXIDE-SIMETH 200-200-20 MG/5ML PO SUSP: 30.0000 mL | ORAL | Status: DC | PRN

## 2014-01-03 MED ORDER — HYDROMORPHONE HCL PF 1 MG/ML IJ SOLN
INTRAMUSCULAR | Status: DC | PRN
  Administered 2014-01-03 (×5): .4 mg via INTRAVENOUS

## 2014-01-03 MED ORDER — HYDROMORPHONE HCL PF 1 MG/ML IJ SOLN
INTRAMUSCULAR | Status: AC
  Filled 2014-01-03: qty 1

## 2014-01-03 MED ORDER — MIDAZOLAM HCL 5 MG/5ML IJ SOLN
INTRAMUSCULAR | Status: DC | PRN
  Administered 2014-01-03 (×2): 1 mg via INTRAVENOUS
  Administered 2014-01-03: 2 mg via INTRAVENOUS

## 2014-01-03 MED ORDER — BUPIVACAINE LIPOSOME 1.3 % IJ SUSP
20.0000 mL | Freq: Once | INTRAMUSCULAR | Status: DC
  Filled 2014-01-03: qty 20

## 2014-01-03 MED ORDER — LIDOCAINE HCL (CARDIAC) 20 MG/ML IV SOLN
INTRAVENOUS | Status: DC | PRN
  Administered 2014-01-03: 100 mg via INTRAVENOUS

## 2014-01-03 MED ORDER — SUFENTANIL CITRATE 50 MCG/ML IV SOLN
INTRAVENOUS | Status: DC | PRN
  Administered 2014-01-03 (×3): 10 ug via INTRAVENOUS
  Administered 2014-01-03: 20 ug via INTRAVENOUS

## 2014-01-03 MED ORDER — MEPERIDINE HCL 50 MG/ML IJ SOLN: 6.2500 mg | INTRAMUSCULAR | Status: DC | PRN

## 2014-01-03 MED ORDER — PHENOL 1.4 % MT LIQD: 1.0000 | OROMUCOSAL | Status: DC | PRN

## 2014-01-03 MED ORDER — SODIUM CHLORIDE 0.9 % IJ SOLN
INTRAMUSCULAR | Status: AC
  Filled 2014-01-03: qty 50

## 2014-01-03 MED ORDER — LACTATED RINGERS IV SOLN
INTRAVENOUS | Status: DC
  Administered 2014-01-03 – 2014-01-05 (×4): via INTRAVENOUS

## 2014-01-03 MED ORDER — MENTHOL 3 MG MT LOZG
1.0000 | LOZENGE | OROMUCOSAL | Status: DC | PRN
  Administered 2014-01-04: 19:00:00 3 mg via ORAL
  Filled 2014-01-03: qty 9

## 2014-01-03 MED ORDER — HYDROMORPHONE HCL PF 2 MG/ML IJ SOLN
INTRAMUSCULAR | Status: AC
  Filled 2014-01-03: qty 1

## 2014-01-03 MED ORDER — CISATRACURIUM BESYLATE 20 MG/10ML IV SOLN
INTRAVENOUS | Status: AC
  Filled 2014-01-03: qty 10

## 2014-01-03 MED ORDER — DEXAMETHASONE SODIUM PHOSPHATE 10 MG/ML IJ SOLN
INTRAMUSCULAR | Status: DC | PRN
  Administered 2014-01-03: 10 mg via INTRAVENOUS

## 2014-01-03 MED ORDER — CEFAZOLIN SODIUM-DEXTROSE 2-3 GM-% IV SOLR
INTRAVENOUS | Status: AC
  Filled 2014-01-03: qty 50

## 2014-01-03 MED ORDER — PROPOFOL 10 MG/ML IV BOLUS
INTRAVENOUS | Status: AC
  Filled 2014-01-03: qty 20

## 2014-01-03 MED ORDER — HYDROMORPHONE HCL PF 1 MG/ML IJ SOLN
0.2500 mg | INTRAMUSCULAR | Status: DC | PRN
  Administered 2014-01-03: 0.5 mg via INTRAVENOUS
  Administered 2014-01-03 (×2): 0.25 mg via INTRAVENOUS

## 2014-01-03 MED ORDER — PROMETHAZINE HCL 25 MG/ML IJ SOLN: 6.2500 mg | INTRAMUSCULAR | Status: DC | PRN

## 2014-01-03 MED ORDER — SUFENTANIL CITRATE 50 MCG/ML IV SOLN
INTRAVENOUS | Status: AC
  Filled 2014-01-03: qty 1

## 2014-01-03 MED ORDER — ONDANSETRON HCL 4 MG/2ML IJ SOLN
INTRAMUSCULAR | Status: DC | PRN
  Administered 2014-01-03: 4 mg via INTRAVENOUS

## 2014-01-03 MED ORDER — ACETAMINOPHEN 325 MG PO TABS: 650.0000 mg | ORAL_TABLET | Freq: Four times a day (QID) | ORAL | Status: DC | PRN

## 2014-01-03 MED ORDER — CELECOXIB 200 MG PO CAPS
200.0000 mg | ORAL_CAPSULE | Freq: Two times a day (BID) | ORAL | Status: DC
  Administered 2014-01-03 – 2014-01-06 (×6): 200 mg via ORAL
  Filled 2014-01-03 (×7): qty 1

## 2014-01-03 MED ORDER — DEXAMETHASONE SODIUM PHOSPHATE 10 MG/ML IJ SOLN
INTRAMUSCULAR | Status: AC
  Filled 2014-01-03: qty 1

## 2014-01-03 MED ORDER — METHOCARBAMOL 500 MG PO TABS
500.0000 mg | ORAL_TABLET | Freq: Four times a day (QID) | ORAL | Status: DC | PRN
  Administered 2014-01-03 – 2014-01-06 (×6): 500 mg via ORAL
  Filled 2014-01-03 (×6): qty 1

## 2014-01-03 MED ORDER — ONDANSETRON HCL 4 MG/2ML IJ SOLN
4.0000 mg | Freq: Four times a day (QID) | INTRAMUSCULAR | Status: DC | PRN
  Administered 2014-01-03: 4 mg via INTRAVENOUS
  Filled 2014-01-03: qty 2

## 2014-01-03 MED ORDER — SODIUM CHLORIDE 0.9 % IJ SOLN
INTRAMUSCULAR | Status: AC
  Filled 2014-01-03: qty 10

## 2014-01-03 MED ORDER — LACTATED RINGERS IV SOLN
INTRAVENOUS | Status: DC | PRN
  Administered 2014-01-03 (×2): via INTRAVENOUS

## 2014-01-03 MED ORDER — CEFAZOLIN SODIUM-DEXTROSE 2-3 GM-% IV SOLR
2.0000 g | INTRAVENOUS | Status: AC
  Administered 2014-01-03: 2 g via INTRAVENOUS

## 2014-01-03 MED ORDER — OXYCODONE HCL 5 MG PO TABS: 5.0000 mg | ORAL_TABLET | Freq: Once | ORAL | Status: DC | PRN

## 2014-01-03 MED ORDER — CISATRACURIUM BESYLATE (PF) 10 MG/5ML IV SOLN
INTRAVENOUS | Status: DC | PRN
  Administered 2014-01-03: 6 mg via INTRAVENOUS

## 2014-01-03 MED ORDER — THROMBIN 5000 UNITS EX SOLR
CUTANEOUS | Status: DC | PRN
  Administered 2014-01-03: 5000 [IU] via TOPICAL

## 2014-01-03 MED ORDER — PROPOFOL 10 MG/ML IV BOLUS
INTRAVENOUS | Status: DC | PRN
  Administered 2014-01-03: 200 mg via INTRAVENOUS

## 2014-01-03 MED ORDER — OXYCODONE-ACETAMINOPHEN 5-325 MG PO TABS
2.0000 | ORAL_TABLET | ORAL | Status: DC | PRN
  Administered 2014-01-03 – 2014-01-04 (×4): 2 via ORAL
  Filled 2014-01-03 (×4): qty 2

## 2014-01-03 MED ORDER — ONDANSETRON HCL 4 MG PO TABS: 4.0000 mg | ORAL_TABLET | Freq: Four times a day (QID) | ORAL | Status: DC | PRN

## 2014-01-03 MED ORDER — SUCCINYLCHOLINE CHLORIDE 20 MG/ML IJ SOLN
INTRAMUSCULAR | Status: DC | PRN
  Administered 2014-01-03: 100 mg via INTRAVENOUS

## 2014-01-03 MED ORDER — POLYETHYLENE GLYCOL 3350 17 G PO PACK: 17.0000 g | PACK | Freq: Every day | ORAL | Status: DC | PRN

## 2014-01-03 MED ORDER — FERROUS SULFATE 325 (65 FE) MG PO TABS
325.0000 mg | ORAL_TABLET | Freq: Three times a day (TID) | ORAL | Status: DC
  Administered 2014-01-03 – 2014-01-06 (×8): 325 mg via ORAL
  Filled 2014-01-03 (×11): qty 1

## 2014-01-03 MED ORDER — FLEET ENEMA 7-19 GM/118ML RE ENEM: 1.0000 | ENEMA | Freq: Once | RECTAL | Status: AC | PRN

## 2014-01-03 MED ORDER — BISACODYL 10 MG RE SUPP: 10.0000 mg | Freq: Every day | RECTAL | Status: DC | PRN

## 2014-01-03 MED ORDER — EPHEDRINE SULFATE 50 MG/ML IJ SOLN
INTRAMUSCULAR | Status: AC
  Filled 2014-01-03: qty 1

## 2014-01-03 MED ORDER — KETAMINE HCL 10 MG/ML IJ SOLN
INTRAMUSCULAR | Status: DC | PRN
  Administered 2014-01-03 (×4): 10 mg via INTRAVENOUS
  Administered 2014-01-03: 30 mg via INTRAVENOUS

## 2014-01-03 MED ORDER — RIVAROXABAN 10 MG PO TABS
10.0000 mg | ORAL_TABLET | Freq: Every day | ORAL | Status: DC
  Administered 2014-01-04 – 2014-01-06 (×3): 10 mg via ORAL
  Filled 2014-01-03 (×4): qty 1

## 2014-01-03 MED ORDER — KETAMINE HCL 10 MG/ML IJ SOLN
INTRAMUSCULAR | Status: AC
  Filled 2014-01-03: qty 1

## 2014-01-03 MED ORDER — SODIUM CHLORIDE 0.9 % IR SOLN
Status: DC | PRN
  Administered 2014-01-03: 1000 mL

## 2014-01-03 MED ORDER — SODIUM CHLORIDE 0.9 % IR SOLN
Status: DC | PRN
  Administered 2014-01-03: 14:00:00

## 2014-01-03 MED ORDER — ACETAMINOPHEN 650 MG RE SUPP: 650.0000 mg | Freq: Four times a day (QID) | RECTAL | Status: DC | PRN

## 2014-01-03 MED ORDER — ONDANSETRON HCL 4 MG/2ML IJ SOLN
INTRAMUSCULAR | Status: AC
  Filled 2014-01-03: qty 2

## 2014-01-03 MED ORDER — HYDROMORPHONE HCL PF 1 MG/ML IJ SOLN
1.0000 mg | INTRAMUSCULAR | Status: DC | PRN
  Administered 2014-01-03 – 2014-01-05 (×8): 1 mg via INTRAVENOUS
  Filled 2014-01-03 (×8): qty 1

## 2014-01-03 MED ORDER — MIDAZOLAM HCL 2 MG/2ML IJ SOLN
INTRAMUSCULAR | Status: AC
  Filled 2014-01-03: qty 2

## 2014-01-03 MED ORDER — OXYCODONE HCL 5 MG/5ML PO SOLN: 5.0000 mg | Freq: Once | ORAL | Status: DC | PRN

## 2014-01-03 MED ORDER — THROMBIN 5000 UNITS EX SOLR
CUTANEOUS | Status: AC
  Filled 2014-01-03: qty 5000

## 2014-01-03 MED ORDER — DEXTROSE 5 % IV SOLN
500.0000 mg | Freq: Four times a day (QID) | INTRAVENOUS | Status: DC | PRN
  Administered 2014-01-03: 500 mg via INTRAVENOUS
  Filled 2014-01-03: qty 5

## 2014-01-03 SURGICAL SUPPLY — 64 items
BAG ZIPLOCK 12X15 (MISCELLANEOUS) ×3 IMPLANT
BANDAGE ELASTIC 4 VELCRO ST LF (GAUZE/BANDAGES/DRESSINGS) ×3 IMPLANT
BANDAGE ELASTIC 6 VELCRO ST LF (GAUZE/BANDAGES/DRESSINGS) ×3 IMPLANT
BANDAGE ESMARK 6X9 LF (GAUZE/BANDAGES/DRESSINGS) ×1 IMPLANT
BLADE SAG 18X100X1.27 (BLADE) ×3 IMPLANT
BLADE SAW SGTL 11.0X1.19X90.0M (BLADE) ×3 IMPLANT
BNDG ESMARK 6X9 LF (GAUZE/BANDAGES/DRESSINGS) ×3
BONE CEMENT GENTAMICIN (Cement) ×6 IMPLANT
CAPT RP KNEE ×3 IMPLANT
CEMENT BONE GENTAMICIN 40 (Cement) ×2 IMPLANT
CUFF TOURN SGL QUICK 34 (TOURNIQUET CUFF) ×2
CUFF TRNQT CYL 34X4X40X1 (TOURNIQUET CUFF) ×1 IMPLANT
DERMABOND ADVANCED (GAUZE/BANDAGES/DRESSINGS) ×2
DERMABOND ADVANCED .7 DNX12 (GAUZE/BANDAGES/DRESSINGS) ×1 IMPLANT
DRAPE EXTREMITY T 121X128X90 (DRAPE) ×3 IMPLANT
DRAPE INCISE IOBAN 66X45 STRL (DRAPES) ×3 IMPLANT
DRAPE LG THREE QUARTER DISP (DRAPES) ×3 IMPLANT
DRAPE POUCH INSTRU U-SHP 10X18 (DRAPES) ×3 IMPLANT
DRAPE U-SHAPE 47X51 STRL (DRAPES) ×3 IMPLANT
DRSG AQUACEL AG ADV 3.5X10 (GAUZE/BANDAGES/DRESSINGS) ×3 IMPLANT
DRSG TEGADERM 4X4.75 (GAUZE/BANDAGES/DRESSINGS) ×3 IMPLANT
DURAPREP 26ML APPLICATOR (WOUND CARE) ×3 IMPLANT
ELECT REM PT RETURN 9FT ADLT (ELECTROSURGICAL) ×3
ELECTRODE REM PT RTRN 9FT ADLT (ELECTROSURGICAL) ×1 IMPLANT
EVACUATOR 1/8 PVC DRAIN (DRAIN) ×3 IMPLANT
FACESHIELD LNG OPTICON STERILE (SAFETY) ×21 IMPLANT
GAUZE SPONGE 2X2 8PLY STRL LF (GAUZE/BANDAGES/DRESSINGS) ×1 IMPLANT
GLOVE BIOGEL PI IND STRL 8 (GLOVE) ×1 IMPLANT
GLOVE BIOGEL PI INDICATOR 8 (GLOVE) ×2
GLOVE ECLIPSE 8.0 STRL XLNG CF (GLOVE) ×6 IMPLANT
GLOVE SURG SS PI 6.5 STRL IVOR (GLOVE) ×6 IMPLANT
GOWN STRL REUS W/TWL LRG LVL3 (GOWN DISPOSABLE) ×3 IMPLANT
GOWN STRL REUS W/TWL XL LVL3 (GOWN DISPOSABLE) ×3 IMPLANT
HANDPIECE INTERPULSE COAX TIP (DISPOSABLE) ×2
IMMOBILIZER KNEE 20 (SOFTGOODS) ×3 IMPLANT
KIT BASIN OR (CUSTOM PROCEDURE TRAY) ×3 IMPLANT
MANIFOLD NEPTUNE II (INSTRUMENTS) ×3 IMPLANT
NEEDLE HYPO 22GX1.5 SAFETY (NEEDLE) ×3 IMPLANT
NS IRRIG 1000ML POUR BTL (IV SOLUTION) IMPLANT
PACK TOTAL JOINT (CUSTOM PROCEDURE TRAY) ×3 IMPLANT
PAD ABD 8X10 STRL (GAUZE/BANDAGES/DRESSINGS) IMPLANT
PADDING CAST ABS 6INX4YD NS (CAST SUPPLIES) ×2
PADDING CAST ABS COTTON 6X4 NS (CAST SUPPLIES) ×1 IMPLANT
PADDING CAST COTTON 6X4 STRL (CAST SUPPLIES) IMPLANT
POSITIONER SURGICAL ARM (MISCELLANEOUS) ×3 IMPLANT
SET HNDPC FAN SPRY TIP SCT (DISPOSABLE) ×1 IMPLANT
SPONGE GAUZE 2X2 STER 10/PKG (GAUZE/BANDAGES/DRESSINGS) ×2
SPONGE LAP 18X18 X RAY DECT (DISPOSABLE) IMPLANT
SPONGE SURGIFOAM ABS GEL 100 (HEMOSTASIS) ×3 IMPLANT
STAPLER VISISTAT 35W (STAPLE) IMPLANT
SUCTION FRAZIER 12FR DISP (SUCTIONS) ×3 IMPLANT
SUT BONE WAX W31G (SUTURE) ×3 IMPLANT
SUT MNCRL AB 4-0 PS2 18 (SUTURE) ×3 IMPLANT
SUT VIC AB 1 CT1 27 (SUTURE) ×4
SUT VIC AB 1 CT1 27XBRD ANTBC (SUTURE) ×2 IMPLANT
SUT VIC AB 2-0 CT1 27 (SUTURE) ×6
SUT VIC AB 2-0 CT1 TAPERPNT 27 (SUTURE) ×3 IMPLANT
SUT VLOC 180 0 24IN GS25 (SUTURE) ×3 IMPLANT
SYR 20CC LL (SYRINGE) ×3 IMPLANT
TOWEL OR 17X26 10 PK STRL BLUE (TOWEL DISPOSABLE) ×6 IMPLANT
TOWER CARTRIDGE SMART MIX (DISPOSABLE) ×3 IMPLANT
TRAY FOLEY CATH 14FRSI W/METER (CATHETERS) ×3 IMPLANT
WATER STERILE IRR 1500ML POUR (IV SOLUTION) ×6 IMPLANT
WRAP KNEE MAXI GEL POST OP (GAUZE/BANDAGES/DRESSINGS) ×3 IMPLANT

## 2014-01-03 NOTE — Anesthesia Procedure Notes (Signed)
Procedure Name: Intubation Date/Time: 01/03/2014 12:50 PM Performed by: Danley Danker L Patient Re-evaluated:Patient Re-evaluated prior to inductionOxygen Delivery Method: Circle system utilized Preoxygenation: Pre-oxygenation with 100% oxygen Intubation Type: IV induction Laryngoscope Size: Miller and 2 Grade View: Grade II Tube type: Oral Tube size: 8.0 mm Number of attempts: 1 Airway Equipment and Method: Stylet Placement Confirmation: ETT inserted through vocal cords under direct vision,  breath sounds checked- equal and bilateral and positive ETCO2 Secured at: 22 cm Tube secured with: Tape Dental Injury: Teeth and Oropharynx as per pre-operative assessment

## 2014-01-03 NOTE — Anesthesia Preprocedure Evaluation (Signed)
Anesthesia Evaluation  Patient identified by MRN, date of birth, ID band Patient awake    Reviewed: Allergy & Precautions, H&P , NPO status , Patient's Chart, lab work & pertinent test results  Airway Mallampati: II TM Distance: >3 FB Neck ROM: Full    Dental  (+) Dental Advisory Given and Teeth Intact   Pulmonary Current Smoker,  breath sounds clear to auscultation        Cardiovascular hypertension, negative cardio ROS  Rhythm:Regular Rate:Normal     Neuro/Psych negative neurological ROS  negative psych ROS   GI/Hepatic negative GI ROS, Neg liver ROS,   Endo/Other  Hyperthyroidism   Renal/GU negative Renal ROS     Musculoskeletal negative musculoskeletal ROS (+)   Abdominal (+) - obese,   Peds  Hematology  (+) anemia ,   Anesthesia Other Findings   Reproductive/Obstetrics negative OB ROS                           Anesthesia Physical Anesthesia Plan  ASA: II  Anesthesia Plan: General   Post-op Pain Management:    Induction: Intravenous  Airway Management Planned: Oral ETT  Additional Equipment:   Intra-op Plan:   Post-operative Plan: Extubation in OR  Informed Consent: I have reviewed the patients History and Physical, chart, labs and discussed the procedure including the risks, benefits and alternatives for the proposed anesthesia with the patient or authorized representative who has indicated his/her understanding and acceptance.   Dental advisory given  Plan Discussed with: CRNA  Anesthesia Plan Comments:         Anesthesia Quick Evaluation

## 2014-01-03 NOTE — Interval H&P Note (Signed)
History and Physical Interval Note:  01/03/2014 12:34 PM  [name]  has presented today for surgery, with the diagnosis of osteoarthritis of the left knee  The various methods of treatment have been discussed with the patient and family. After consideration of risks, benefits and other options for treatment, the patient has consented to  Procedure(s): LEFT TOTAL KNEE ARTHROPLASTY (Left) as a surgical intervention .  The patient's history has been reviewed, patient examined, no change in status, stable for surgery.  I have reviewed the patient's chart and labs.  Questions were answered to the patient's satisfaction.     Douglas Rooks A

## 2014-01-03 NOTE — Preoperative (Signed)
Beta Blockers   Reason not to administer Beta Blockers:Not Applicable 

## 2014-01-03 NOTE — Anesthesia Postprocedure Evaluation (Signed)
Anesthesia Post Note  Patient: [name]  Procedure(s) Performed: Procedure(s) (LRB): LEFT TOTAL KNEE ARTHROPLASTY (Left)  Anesthesia type: General  Patient location: PACU  Post pain: Pain level controlled  Post assessment: Post-op Vital signs reviewed  Last Vitals: BP 141/87  Pulse 79  Temp(Src) 36.4 C (Oral)  Resp 17  Ht 5\' 6"  (1.676 m)  Wt 231 lb (104.781 kg)  BMI 37.30 kg/m2  SpO2 99%  LMP 12/07/2013  Post vital signs: Reviewed  Level of consciousness: sedated  Complications: No apparent anesthesia complications

## 2014-01-03 NOTE — Transfer of Care (Signed)
Immediate Anesthesia Transfer of Care Note  Patient: [name]  Procedure(s) Performed: Procedure(s): LEFT TOTAL KNEE ARTHROPLASTY (Left)  Patient Location: PACU  Anesthesia Type:General  Level of Consciousness: awake, alert  and oriented  Airway & Oxygen Therapy: Patient Spontanous Breathing and Patient connected to face mask oxygen  Post-op Assessment: Report given to PACU RN and Post -op Vital signs reviewed and stable  Post vital signs: Reviewed and stable  Complications: No apparent anesthesia complications

## 2014-01-03 NOTE — Brief Op Note (Signed)
01/03/2014  2:40 PM  PATIENT:  [name]  48 y.o. female  PRE-OPERATIVE DIAGNOSIS:  osteoarthritis of the left knee  POST-OPERATIVE DIAGNOSIS:  osteoarthritis of the left knee and [place] Obesity  PROCEDURE:  Procedure(s): LEFT TOTAL KNEE ARTHROPLASTY (Left)  SURGEON:  Surgeon(s) and Role:    * Tobi Bastos, MD - Primary  PHYSICIAN ASSISTANT:Amber Primera PA   ASSISTANTS: Ardeen Jourdain PA  NESTHESIA:   general  EBL:  Total I/O In: 1000 [I.V.:1000] Out: 300 [Urine:300]  BLOOD ADMINISTERED:none  DRAINS: (One) Hemovact drain(s) in the Hemovac with  Suction Open   LOCAL MEDICATIONS USED:  BUPIVICAINE20cc mixed with 20cc of Normal Saline   SPECIMEN:  No Specimen  DISPOSITION OF SPECIMEN:  N/A  COUNTS:  YES  TOURNIQUET:  * Missing tourniquet times found for documented tourniquets in log:  [id number] *  DICTATION: .Other Dictation: Dictation Number [id number]  PLAN OF CARE: Admit to inpatient   PATIENT DISPOSITION:  Stable in OR   Delay start of Pharmacological VTE agent (>24hrs) due to surgical blood loss or risk of bleeding: yes

## 2014-01-04 LAB — BASIC METABOLIC PANEL
BUN: 13 mg/dL (ref 6–23)
CO2: 22 mEq/L (ref 19–32)
Calcium: 8.1 mg/dL — ABNORMAL LOW (ref 8.4–10.5)
Chloride: 101 mEq/L (ref 96–112)
Creatinine, Ser: 0.86 mg/dL (ref 0.50–1.10)
GFR, EST NON AFRICAN AMERICAN: 79 mL/min — AB (ref >90)
GLUCOSE: 144 mg/dL — AB (ref 70–99)
POTASSIUM: 4.1 meq/L (ref 3.7–5.3)
SODIUM: 134 meq/L — AB (ref 137–147)

## 2014-01-04 LAB — CBC
HCT: 21.5 % — ABNORMAL LOW (ref 36.0–46.0)
HEMOGLOBIN: 6.8 g/dL — AB (ref 12.0–15.0)
MCH: 24.5 pg — ABNORMAL LOW (ref 26.0–34.0)
MCHC: 31.6 g/dL (ref 30.0–36.0)
MCV: 77.3 fL — ABNORMAL LOW (ref 78.0–100.0)
Platelets: 231 10*3/uL (ref 150–400)
RBC: 2.78 MIL/uL — AB (ref 3.87–5.11)
RDW: 20.3 % — ABNORMAL HIGH (ref 11.5–15.5)
WBC: 7.9 10*3/uL (ref 4.0–10.5)

## 2014-01-04 LAB — PREPARE RBC (CROSSMATCH)

## 2014-01-04 MED ORDER — OXYCODONE HCL 5 MG PO TABS
5.0000 mg | ORAL_TABLET | ORAL | Status: DC | PRN
  Administered 2014-01-04: 16:00:00 15 mg via ORAL
  Administered 2014-01-04: 10 mg via ORAL
  Administered 2014-01-04: 5 mg via ORAL
  Filled 2014-01-04: qty 3
  Filled 2014-01-04: qty 2
  Filled 2014-01-04: qty 1

## 2014-01-04 MED ORDER — HYDROMORPHONE HCL 2 MG PO TABS: 2.0000 mg | ORAL_TABLET | ORAL | Status: DC | PRN

## 2014-01-04 NOTE — Progress Notes (Signed)
Utilization review completed.  

## 2014-01-04 NOTE — Progress Notes (Signed)
CRITICAL VALUE ALERT  Critical value received:  Hgb 6.8  Date of notification:  01/04/2014  Time of notification:  0630  Critical value read back: yes  Nurse who received alert:  Tor Netters  MD notified (1st page):  Lacie Draft, PA  Time of first page:  [phone]  MD notified (2nd page):  Time of second page:  Responding MD:  Lacie Draft, PA  Time MD responded:  [phone]

## 2014-01-04 NOTE — Op Note (Signed)
[name], [name]               ACCOUNT NO.:  [id number]  MEDICAL RECORD NO.:  [id number]  LOCATION:  8242                         FACILITY:  [facility]  PHYSICIAN:  Kipp Brood. Terrill Wauters, M.D.DATE OF BIRTH:  23-May-1966  DATE OF PROCEDURE:  01/03/2014 DATE OF DISCHARGE:                              OPERATIVE REPORT   SURGEON:  Kipp Brood. Gladstone Lighter, M.D.  ASSISTANT:  Ardeen Jourdain, [place]  PREOPERATIVE DIAGNOSES: 1. Morbid obesity. 2. Severe degenerative arthritis with bone-on-bone of the left knee     with a slight flexion contracture.  POSTOPERATIVE DIAGNOSES: 1. Morbid obesity. 2. Severe degenerative arthritis with bone-on-bone of the left knee     with a slight flexion contracture.  OPERATION:  Left total knee arthroplasty utilizing DePuy system, all 3 components were cemented, I used gentamicin in the cement.  The sizes used was a size 4 narrow femoral component, left posterior cruciate sacrificing type.  The tibial tray was a size 3.  The insert was a rotating platform, size 4, 12.5 mm thickness.  The patella was a size 38 patella with 3 pegs.  PROCEDURE:  Under general anesthesia, routine orthopedic prepping and draping of the left lower extremity was carried out.  Appropriate time- out was first carried out.  I also marked the appropriate left leg in the holding area.  At this time, the leg was exsanguinated with an Esmarch, tourniquet was elevated at 350 mmHg.  The knee was flexed and anterior approach of the knee was carried out.  Two flaps were created. I then carried out a median patellar approach, reflected the patella laterally, flexed the knee and did excision of the medial and lateral menisci in the anterior-posterior cruciate.  Also did extensive synovectomy.  Note her knee was quite filled with serous fluid at the time of procedure.  Then, initial drill hole was made in the intercondylar notch.  I then inserted a canal finer, removed that and then irrigated the  canal.  First jig was inserted and I removed 12 mm thickness off the distal femur at that time.  Following that, I measured the femur to be a size 4 left.  I then cut the femur for a size 4 left. I did my anterior-posterior chamfering cuts in the usual fashion.  Once the femur was completed, attention was directed to the tibia.  I removed the proximal part of the tibial plateau which consisted of the attachments of the cruciate.  Following that, we measured the tibial plateau to be a size 3.  We made a drill hole in the patellar and the tibial plateau.  The canal finder was inserted and removed.  I thoroughly irrigated out the canal.  We then cut the tibia by removing approximately 8 mm thickness from the unaffected side.  Note, the lateral portion of the joint was depressed.  We thoroughly irrigated that out and after making the appropriate cuts we then went through our spacer guide blocks.  We then proceeded to cut our tibia, to continue to cutting our tibia for the keel cut.  After that, we then made our notch to cut out the distal femur.  Trial components were inserted.  We reduced  the knee and had excellent motion with a 12.5 mm thickness insert.  We first tried a 10 and it was too loose.  We did aim for 12.5 because of her age.  Following that, we then did a resurfacing procedure on the patella for a size 38 patella.  Three drill holes were made in the patella in the usual fashion.  We selected a 38-mm patellar implant. All trial components were removed.  We thoroughly waterpicked out the knee and cemented all 3 components in simultaneously.  After the cement was hardened, we removed all loose pieces of cement.  We irrigated with a waterpick to make sure we had all cement removed.  Following that, we then injected 20 mL of the 40 mL mixture of Exparel and normal saline into the soft tissue structures.  We saved the other half for the end of the procedure.  We then inserted some  thrombin-soaked Gelfoam posteriorly.  I then made sure the knee was dried out.  There were no other loose pieces of cement.  I inserted my permanent rotating platform size 4, 12.5 mm thickness, reduced the knee and had excellent function. We then inserted a Hemovac drain and closed the knee in layers in the usual fashion.  Once again, we completed the injection of Exparel at end of the procedure.  She did have 2 g of IV Ancef preop.          ______________________________ Kipp Brood. Gladstone Lighter, M.D.     RAG/MEDQ  D:  01/03/2014  T:  01/04/2014  Job:  [id number]

## 2014-01-04 NOTE — Care Management Note (Addendum)
    Page 1 of 2   01/06/2014     5:45:20 PM   CARE MANAGEMENT NOTE 01/06/2014  Patient:  [name], [name]   Account Number:  [id number]  Date Initiated:  01/04/2014  Documentation initiated by:  Sherrin Daisy  Subjective/Objective Assessment:   total knee replacemnt    Referral to [facility] Go from MD office. Services will start day after discharge.     Action/Plan:   pt having moderate pain. Pt/ot pending.  CM will follow   Anticipated DC Date:  01/06/2014   Anticipated DC Plan:  [place]  CM consult      [place]   Choice offered to / List presented to:  C-1 Patient        [place] arranged  HH-2 PT      [place]   Status of service:  Completed, signed off Medicare Important Message given?  NA - LOS <3 / Initial given by admissions (If response is "NO", the following Medicare IM given date fields will be blank) Date Medicare IM given:   Date Additional Medicare IM given:    Discharge Disposition:  [place]  Per UR Regulation:    If discussed at Long Length of Stay Meetings, dates discussed:    Comments:  01/05/2014 Sherrin Daisy BSN RN CCM [phone] CM spoke with patient. Current plans are for patient to return to her home in [place] where son and fiance will be caregivers. She plans to use [place] for [facility] services. She already has RW but will ned 3n1 commode. [facility] notified of need for DME.

## 2014-01-04 NOTE — Evaluation (Signed)
Physical Therapy Evaluation Patient Details Name: [name] MRN: [id number] DOB: May 23, 1966 Today's Date: 01/04/2014 Time: 4580-9983 PT Time Calculation (min): 16 min  PT Assessment / Plan / Recommendation History of Present Illness     Clinical Impression  Pt s/p L TKR presents with decreased L LE strength/ROM and post op pain limiting functional mobility.  Pt should progress to d/c home with family assist and HHPT follow up.    PT Assessment  Patient needs continued PT services    Follow Up Recommendations  Home health PT    Does the patient have the potential to tolerate intense rehabilitation      Barriers to Discharge        Equipment Recommendations  None recommended by PT    Recommendations for Other Services OT consult   Frequency 7X/week    Precautions / Restrictions Precautions Precautions: Knee;Fall Required Braces or Orthoses: Knee Immobilizer - Left Knee Immobilizer - Left: Discontinue once straight leg raise with < 10 degree lag Restrictions Weight Bearing Restrictions: No   Pertinent Vitals/Pain 8/10 with activity; premed, ice pack provided      Mobility  Bed Mobility Overal bed mobility:  (Pt refused stating she had sit up with nursing already) Transfers Overall transfer level:  (Not Assessed - Pt refused stating she had stood with nursing) Ambulation/Gait Ambulation/Gait assistance:  (NT - Pt refused stating she had been up with nursing)    Exercises Total Joint Exercises Ankle Circles/Pumps: AROM;10 reps;Supine;Both Quad Sets: AROM;Both;10 reps;Supine Heel Slides: AAROM;10 reps;Supine;Left Straight Leg Raises: AAROM;Left;10 reps;Supine   PT Diagnosis: Difficulty walking  PT Problem List: Decreased strength;Decreased range of motion;Decreased activity tolerance;Decreased mobility;Decreased knowledge of use of DME;Obesity;Pain PT Treatment Interventions: DME instruction;Gait training;Stair training;Functional mobility training;Therapeutic  activities;Therapeutic exercise;Patient/family education     PT Goals(Current goals can be found in the care plan section) Acute Rehab PT Goals Patient Stated Goal: Resume previous lifestyle with decreased pain PT Goal Formulation: With patient Time For Goal Achievement: 01/11/14 Potential to Achieve Goals: Good  Visit Information  Last PT Received On: 01/04/14 Assistance Needed: +1       Prior [place] expects to be discharged to:: Private residence Living Arrangements: Children Available Help at Discharge: Family Type of Home: Apartment Home Access: Stairs to enter Technical brewer of Steps: 3 Entrance Stairs-Rails: None Home Layout: One level Home Equipment: Environmental consultant - 2 wheels Prior Function Level of Independence: Independent Communication Communication: No difficulties Dominant Hand: Right    Cognition  Cognition Arousal/Alertness: Awake/alert Behavior During Therapy: WFL for tasks assessed/performed Overall Cognitive Status: Within Functional Limits for tasks assessed    Extremity/Trunk Assessment Upper Extremity Assessment Upper Extremity Assessment: Overall WFL for tasks assessed Lower Extremity Assessment Lower Extremity Assessment: LLE deficits/detail LLE Deficits / Details: 2-/5 quads with AAROM at knee -10 - 30   Balance    End of Session PT - End of Session Equipment Utilized During Treatment: Left knee immobilizer Activity Tolerance: Other (comment);Patient limited by pain (Ltd motivation to self assist) Patient left: in bed;with call bell/phone within reach;with family/visitor present  GP     [name] 01/04/2014, 5:28 PM

## 2014-01-04 NOTE — Progress Notes (Signed)
   Subjective: 1 Day Post-Op Procedure(s) (LRB): LEFT TOTAL KNEE ARTHROPLASTY (Left) Patient reports pain as moderate.   Patient seen in rounds with Dr. Gladstone Lighter. Patient is doing fair this morning. No issues overnight. No SOB or chest pain. She reports that her pain medication is not working well.  Plan is to go Home after hospital stay.  Objective: Vital signs in last 24 hours: Temp:  [97.5 F (36.4 C)-98.4 F (36.9 C)] 98.3 F (36.8 C) (01/14 1051) Pulse Rate:  [60-94] 74 (01/14 1051) Resp:  [14-22] 16 (01/14 1051) BP: (104-173)/(63-97) 146/84 mmHg (01/14 1051) SpO2:  [97 %-100 %] 100 % (01/14 1051) Weight:  [104.781 kg (231 lb)] 104.781 kg (231 lb) (01/13 1615)  Intake/Output from previous day:  Intake/Output Summary (Last 24 hours) at 01/04/14 1103 Last data filed at 01/04/14 0939  Gross per 24 hour  Intake 2928.33 ml  Output   1795 ml  Net 1133.33 ml    Intake/Output this shift: Total I/O In: 240 [P.O.:240] Out: -   Labs:  Recent Labs  01/03/14 1723 01/04/14 0507  HGB 8.5* 6.8*    Recent Labs  01/03/14 1723 01/04/14 0507  WBC  --  7.9  RBC  --  2.78*  HCT 26.9* 21.5*  PLT  --  231    Recent Labs  01/04/14 0507  NA 134*  K 4.1  CL 101  CO2 22  BUN 13  CREATININE 0.86  GLUCOSE 144*  CALCIUM 8.1*     EXAM General - Patient is Alert and Oriented Extremity - Neurologically intact Neurovascular intact No cellulitis present Compartment soft Dressing - dressing C/D/I Motor Function - intact, moving foot and toes well on exam.  Hemovac pulled without difficulty.  Past Medical History  Diagnosis Date  . Anemia   . Fibroids   . Thyroid goiter   . Knee joint injury   . Hypertension     no current meds for htn    Assessment/Plan: 1 Day Post-Op Procedure(s) (LRB): LEFT TOTAL KNEE ARTHROPLASTY (Left) Active Problems:   Osteoarthritis of left knee   S/P total knee arthroplasty, left  Estimated body mass index is 37.3 kg/(m^2) as  calculated from the following:   Height as of this encounter: 5\' 6"  (1.676 m).   Weight as of this encounter: 104.781 kg (231 lb). Advance diet Up with therapy when blood transfusion completed  DVT Prophylaxis - Xarelto Weight-Bearing as tolerated  Transfusion in process now. Will start therapy today when complete. Wil change Percocet to Oxycodone IR for better pain control.   Geraldine Tesar LAUREN 01/04/2014, 11:03 AM

## 2014-01-05 DIAGNOSIS — D62 Acute posthemorrhagic anemia: Secondary | ICD-10-CM | POA: Diagnosis not present

## 2014-01-05 LAB — BASIC METABOLIC PANEL
BUN: 12 mg/dL (ref 6–23)
CHLORIDE: 101 meq/L (ref 96–112)
CO2: 23 meq/L (ref 19–32)
Calcium: 8 mg/dL — ABNORMAL LOW (ref 8.4–10.5)
Creatinine, Ser: 0.76 mg/dL (ref 0.50–1.10)
GFR calc Af Amer: >90 mL/min (ref >90)
GFR calc non Af Amer: >90 mL/min (ref >90)
Glucose, Bld: 97 mg/dL (ref 70–99)
Potassium: 3.8 mEq/L (ref 3.7–5.3)
SODIUM: 135 meq/L — AB (ref 137–147)

## 2014-01-05 LAB — TYPE AND SCREEN
ABO/RH(D): AB POS
Antibody Screen: NEGATIVE
Unit division: 0
Unit division: 0

## 2014-01-05 LAB — CBC
HCT: 25.6 % — ABNORMAL LOW (ref 36.0–46.0)
Hemoglobin: 8.2 g/dL — ABNORMAL LOW (ref 12.0–15.0)
MCH: 25.5 pg — ABNORMAL LOW (ref 26.0–34.0)
MCHC: 32 g/dL (ref 30.0–36.0)
MCV: 79.5 fL (ref 78.0–100.0)
PLATELETS: 208 10*3/uL (ref 150–400)
RBC: 3.22 MIL/uL — AB (ref 3.87–5.11)
RDW: 19.4 % — ABNORMAL HIGH (ref 11.5–15.5)
WBC: 7.2 10*3/uL (ref 4.0–10.5)

## 2014-01-05 MED ORDER — FERROUS SULFATE 325 (65 FE) MG PO TABS: 325.0000 mg | ORAL_TABLET | Freq: Three times a day (TID) | ORAL | Status: DC

## 2014-01-05 MED ORDER — HYDROCORTISONE 1 % EX CREA
TOPICAL_CREAM | CUTANEOUS | Status: DC | PRN
  Filled 2014-01-05: qty 28

## 2014-01-05 MED ORDER — METHOCARBAMOL 500 MG PO TABS: 500.0000 mg | ORAL_TABLET | Freq: Four times a day (QID) | ORAL | Status: DC | PRN

## 2014-01-05 MED ORDER — OXYCODONE-ACETAMINOPHEN 5-325 MG PO TABS
1.0000 | ORAL_TABLET | ORAL | Status: DC | PRN
  Administered 2014-01-05 – 2014-01-06 (×7): 2 via ORAL
  Filled 2014-01-05 (×7): qty 2

## 2014-01-05 MED ORDER — OXYCODONE-ACETAMINOPHEN 10-325 MG PO TABS: 1.0000 | ORAL_TABLET | ORAL | Status: DC | PRN

## 2014-01-05 MED ORDER — RIVAROXABAN 10 MG PO TABS: 10.0000 mg | ORAL_TABLET | Freq: Every day | ORAL | Status: DC

## 2014-01-05 NOTE — Evaluation (Signed)
Occupational Therapy Evaluation Patient Details Name: [name] MRN: [id number] DOB: 1966-01-26 Today's Date: 01/05/2014 Time: 2831-5176 OT Time Calculation (min): 27 min  OT Assessment / Plan / Recommendation History of present illness s/p TKR   Clinical Impression   Pt presents to OT with decreased I with ADL activity and will benefit from skilled OT to increase I with ADL activity and return to PLOF    OT Assessment  Patient needs continued OT Services    Follow Up Recommendations  Home health OT       Equipment Recommendations  3 in 1 bedside comode       Frequency  Min 2X/week    Precautions / Restrictions Precautions Precautions: Knee;Fall Required Braces or Orthoses: Knee Immobilizer - Left Knee Immobilizer - Left: Discontinue once straight leg raise with < 10 degree lag Restrictions Weight Bearing Restrictions: No       ADL  Grooming: Set up Where Assessed - Grooming: Unsupported sitting Upper Body Bathing: Set up Where Assessed - Upper Body Bathing: Unsupported sitting Lower Body Bathing: Minimal assistance Where Assessed - Lower Body Bathing: Unsupported sit to stand Upper Body Dressing: Set up Where Assessed - Upper Body Dressing: Unsupported sitting Lower Body Dressing: Minimal assistance Where Assessed - Lower Body Dressing: Unsupported sit to stand Toilet Transfer Method: Sit to stand Toilet Transfer Equipment: Other (comment) (bed to chair) Toileting - Clothing Manipulation and Hygiene: Min guard Where Assessed - Best boy and Hygiene: Standing Tub/Shower Transfer: Min guard    OT Diagnosis: Generalized weakness;Acute pain  OT Problem List: Decreased strength;Pain OT Treatment Interventions: Self-care/ADL training;Patient/family education   OT Goals(Current goals can be found in the care plan section) Acute Rehab OT Goals Patient Stated Goal: Resume previous lifestyle with decreased pain OT Goal Formulation: With  patient Time For Goal Achievement: 01/19/14 Potential to Achieve Goals: Good ADL Goals Pt Will Perform Grooming: with modified independence;standing Pt Will Perform Lower Body Bathing: with modified independence;sit to/from stand Pt Will Perform Lower Body Dressing: with modified independence;sit to/from stand Pt Will Transfer to Toilet: with modified independence;regular height toilet;ambulating Pt Will Perform Toileting - Clothing Manipulation and hygiene: with modified independence;sit to/from stand  Visit Information  Last OT Received On: 01/05/14 History of Present Illness: s/p TKR       Prior [place] expects to be discharged to:: Private residence Living Arrangements: Children Available Help at Discharge: Family Type of Home: Apartment Home Access: Stairs to enter Technical brewer of Steps: 3 Entrance Stairs-Rails: None Home Layout: One level Home Equipment: Environmental consultant - 2 wheels Prior Function Level of Independence: Independent Communication Communication: No difficulties Dominant Hand: Right         Vision/Perception Vision - History Patient Visual Report: No change from baseline   Cognition  Cognition Arousal/Alertness: Awake/alert Behavior During Therapy: WFL for tasks assessed/performed Overall Cognitive Status: Within Functional Limits for tasks assessed    Extremity/Trunk Assessment Upper Extremity Assessment Upper Extremity Assessment: Overall WFL for tasks assessed     Mobility Transfers Overall transfer level: Needs assistance Equipment used: Rolling walker (2 wheeled) Transfers: Sit to/from [facility] Sit to Stand: Min guard Stand pivot transfers: Min guard           End of Session OT - End of Session Activity Tolerance: Patient tolerated treatment well Patient left: in chair;with call bell/phone within reach;with family/visitor present Nurse Communication: Mobility status  GO      [name] 01/05/2014,  10:35 AM

## 2014-01-05 NOTE — Progress Notes (Signed)
Physical Therapy Treatment Patient Details Name: [name] MRN: [id number] DOB: [birth year] Today's Date: 01/05/2014 Time: 4825-0037 PT Time Calculation (min): 44 min  PT Assessment / Plan / Recommendation  History of Present Illness s/p TKR   PT Comments   Marked improvement in activity tolerance and willingness to participate  Follow Up Recommendations  Home health PT     Does the patient have the potential to tolerate intense rehabilitation     Barriers to Discharge        Equipment Recommendations  None recommended by PT    Recommendations for Other Services OT consult  Frequency 7X/week   Progress towards PT Goals Progress towards PT goals: Progressing toward goals  Plan Current plan remains appropriate    Precautions / Restrictions Precautions Precautions: Knee;Fall Required Braces or Orthoses: Knee Immobilizer - Left Knee Immobilizer - Left: Discontinue once straight leg raise with < 10 degree lag Restrictions Weight Bearing Restrictions: No   Pertinent Vitals/Pain 6/10; premed, cold packs provided    Mobility  Bed Mobility Overal bed mobility: Needs Assistance Bed Mobility: Supine to Sit;Sit to Supine Supine to sit: Min assist Sit to supine: Min assist General bed mobility comments: cues for sequence, min assist for L LE Transfers Overall transfer level: Needs assistance Equipment used: Rolling walker (2 wheeled) Transfers: Sit to/from Stand Sit to Stand: Min guard Stand pivot transfers: Min guard General transfer comment: cues for LE management and use of UEs to self assist Ambulation/Gait Ambulation/Gait assistance: Min assist;Min guard Ambulation Distance (Feet): 140 Feet Assistive device: Rolling walker (2 wheeled) Gait Pattern/deviations: Step-to pattern;Decreased step length - right;Decreased step length - left;Shuffle;Antalgic;Trunk flexed General Gait Details: cues for posture, sequence and position from RW    Exercises Total Joint  Exercises Ankle Circles/Pumps: AROM;Supine;Both;15 reps Quad Sets: AROM;Both;Supine;15 reps Heel Slides: AAROM;Supine;Left;15 reps Straight Leg Raises: AAROM;Left;Supine;20 reps Goniometric ROM: AAROM at knee -10 - 70   PT Diagnosis:    PT Problem List:   PT Treatment Interventions:     PT Goals (current goals can now be found in the care plan section) Acute Rehab PT Goals Patient Stated Goal: Resume previous lifestyle with decreased pain PT Goal Formulation: With patient Time For Goal Achievement: 01/11/14 Potential to Achieve Goals: Good  Visit Information  Last PT Received On: 01/05/14 Assistance Needed: +1 History of Present Illness: s/p TKR    Subjective Data  Patient Stated Goal: Resume previous lifestyle with decreased pain   Cognition  Cognition Arousal/Alertness: Awake/alert Behavior During Therapy: WFL for tasks assessed/performed Overall Cognitive Status: Within Functional Limits for tasks assessed    Balance     End of Session PT - End of Session Equipment Utilized During Treatment: Left knee immobilizer Activity Tolerance: Patient tolerated treatment well Patient left: in bed;with call bell/phone within reach;with family/visitor present Nurse Communication: Mobility status   GP     Auther Lyerly 01/05/2014, 1:03 PM

## 2014-01-05 NOTE — Discharge Instructions (Addendum)
Walk with your walker. Weightbearing as tolerated [facility] will follow you at home for your therapy  Do not change your dressing unless there is excess drainage Shower only, no tub bath. Call if any temperatures greater than 101 or any wound complications: [phone] during the day and ask for Dr. Charlestine Night nurse, Brunilda Payor.   Information on my medicine - XARELTO (Rivaroxaban)  This medication education was reviewed with me or my healthcare representative as part of my discharge preparation.  The pharmacist that spoke with me during my hospital stay was:  Absher, Julieta Bellini, RPH  Why was Xarelto prescribed for you? Xarelto was prescribed for you to reduce the risk of a blood clots forming after orthopedic surgery OR to reduce the risk of forming blood clots that cause a stroke if you have a medical condition called atrial fibrillation (a type of irregular heartbeat).  What do you need to know about xarelto ? Take your Xarelto ONCE DAILY at the same time every day. If you have difficulty swallowing the tablet whole, you may crush it and mix in applesauce just prior to taking your dose.  Take Xarelto exactly as prescribed by your doctor and DO NOT stop taking Xarelto without talking to the doctor who prescribed the medication.  Stopping without other stroke or VTE prevention medication to take the place of Xarelto may increase your risk of developing a new clot.  After discharge, you should have regular check-up appointments with your healthcare provider that is prescribing your Xarelto.  In the future your dose may need to be changed if your kidney function or weight changes by a significant amount.  What do you do if you miss a dose? If you are taking Xarelto ONCE DAILY and you miss a dose, take it as soon as you remember on the same day then continue your regularly scheduled once daily regimen the next day. Do not take two doses of Xarelto at the same time.    Important Safety Information A possible side effect of Xarelto is bleeding. You should call your healthcare provider right away if you experience any of the following:   Bleeding from an injury or your nose that does not stop.   Unusual colored urine (red or dark brown) or unusual colored stools (red or black).   Unusual bruising for unknown reasons.   A serious fall or if you hit your head (even if there is no bleeding).  Some medicines may interact with Xarelto and might increase your risk of bleeding while on Xarelto. To help avoid this, consult your healthcare provider or pharmacist prior to using any new prescription or non-prescription medications, including herbals, vitamins, non-steroidal anti-inflammatory drugs (NSAIDs) and supplements.  This website has more information on Xarelto: [email or web address]

## 2014-01-05 NOTE — Progress Notes (Signed)
Subjective: 2 Days Post-Op Procedure(s) (LRB): LEFT TOTAL KNEE ARTHROPLASTY (Left) Patient reports pain as 5 on 0-10 scale.  Prefers  Percocet for Pain. Plan on up with PT.Hbg 8.2 after 2-units packed cells. She came in with a HBg 8.5  Objective: Vital signs in last 24 hours: Temp:  [97.9 F (36.6 C)-98.3 F (36.8 C)] 97.9 F (36.6 C) (01/15 0515) Pulse Rate:  [70-107] 88 (01/15 0515) Resp:  [14-16] 16 (01/15 0515) BP: (117-149)/(65-89) 128/85 mmHg (01/15 0515) SpO2:  [95 %-100 %] 99 % (01/15 0515)  Intake/Output from previous day: 01/14 0701 - 01/15 0700 In: 1276.7 [P.O.:480; Blood:796.7] Out: 1125 [Urine:1125] Intake/Output this shift: Total I/O In: -  Out: 325 [Urine:325]   Recent Labs  01/03/14 1723 01/04/14 0507 01/05/14 0500  HGB 8.5* 6.8* 8.2*    Recent Labs  01/04/14 0507 01/05/14 0500  WBC 7.9 7.2  RBC 2.78* 3.22*  HCT 21.5* 25.6*  PLT 231 208    Recent Labs  01/04/14 0507 01/05/14 0500  NA 134* 135*  K 4.1 3.8  CL 101 101  CO2 22 23  BUN 13 12  CREATININE 0.86 0.76  GLUCOSE 144* 97  CALCIUM 8.1* 8.0*   No results found for this basename: LABPT, INR,  in the last 72 hours  Dorsiflexion/Plantar flexion intact  Assessment/Plan: 2 Days Post-Op Procedure(s) (LRB): LEFT TOTAL KNEE ARTHROPLASTY (Left) Up with therapy. Will monitor her HBg closely  Areli Frary A 01/05/2014, 6:48 AM

## 2014-01-05 NOTE — Progress Notes (Signed)
Physical Therapy Treatment Patient Details Name: [name] MRN: [id number] DOB: 1966/05/07 Today's Date: 01/05/2014 Time: 5631-4970 PT Time Calculation (min): 28 min  PT Assessment / Plan / Recommendation  History of Present Illness s/p TKR   PT Comments     Follow Up Recommendations  Home health PT     Does the patient have the potential to tolerate intense rehabilitation     Barriers to Discharge        Equipment Recommendations  None recommended by PT    Recommendations for Other Services OT consult  Frequency 7X/week   Progress towards PT Goals Progress towards PT goals: Progressing toward goals  Plan Current plan remains appropriate    Precautions / Restrictions Precautions Precautions: Knee;Fall Required Braces or Orthoses: Knee Immobilizer - Left Knee Immobilizer - Left: Discontinue once straight leg raise with < 10 degree lag Restrictions Weight Bearing Restrictions: No   Pertinent Vitals/Pain 7/10; premed, RN aware    Mobility  Bed Mobility Overal bed mobility: Needs Assistance Bed Mobility: Supine to Sit;Sit to Supine Supine to sit: Min assist Sit to supine: Min assist General bed mobility comments: cues for sequence, min assist for L LE Transfers Overall transfer level: Needs assistance Equipment used: Rolling walker (2 wheeled) Transfers: Sit to/from Stand Sit to Stand: Min guard General transfer comment: cues for LE management and use of UEs to self assist Ambulation/Gait Ambulation/Gait assistance: Min guard Ambulation Distance (Feet): 223 Feet Assistive device: Rolling walker (2 wheeled) Gait Pattern/deviations: Step-to pattern;Shuffle;Antalgic;Trunk flexed;Decreased step length - left;Decreased step length - right General Gait Details: cues for posture, sequence and position from RW    Exercises     PT Diagnosis:    PT Problem List:   PT Treatment Interventions:     PT Goals (current goals can now be found in the care plan  section) Acute Rehab PT Goals Patient Stated Goal: Resume previous lifestyle with decreased pain PT Goal Formulation: With patient Time For Goal Achievement: 01/11/14 Potential to Achieve Goals: Good  Visit Information  Last PT Received On: 01/05/14 Assistance Needed: +1 History of Present Illness: s/p TKR    Subjective Data  Patient Stated Goal: Resume previous lifestyle with decreased pain   Cognition  Cognition Arousal/Alertness: Awake/alert Behavior During Therapy: WFL for tasks assessed/performed Overall Cognitive Status: Within Functional Limits for tasks assessed    Balance     End of Session PT - End of Session Equipment Utilized During Treatment: Left knee immobilizer Activity Tolerance: Patient tolerated treatment well Patient left: in bed;with call bell/phone within reach;with family/visitor present Nurse Communication: Mobility status   GP     [name] 01/05/2014, 4:15 PM

## 2014-01-05 NOTE — Progress Notes (Signed)
01/05/2014 Sherrin Daisy BSN RN CCM [phone] CM spoke with patient. Current plans are for patient to return to her home in [place] where son and fiance will be caregivers. She plans to use [place] for [facility] services. She already has RW but will need 3n1 commode. [facility] notified of need for DME.

## 2014-01-06 LAB — CBC
HCT: 25.2 % — ABNORMAL LOW (ref 36.0–46.0)
HEMOGLOBIN: 8 g/dL — AB (ref 12.0–15.0)
MCH: 25.5 pg — AB (ref 26.0–34.0)
MCHC: 31.7 g/dL (ref 30.0–36.0)
MCV: 80.3 fL (ref 78.0–100.0)
Platelets: 236 10*3/uL (ref 150–400)
RBC: 3.14 MIL/uL — AB (ref 3.87–5.11)
RDW: 20.4 % — ABNORMAL HIGH (ref 11.5–15.5)
WBC: 7.2 10*3/uL (ref 4.0–10.5)

## 2014-01-06 NOTE — Progress Notes (Signed)
Subjective: 3 Days Post-Op Procedure(s) (LRB): LEFT TOTAL KNEE ARTHROPLASTY (Left) Patient reports pain as 3 on 0-10 scale. Doing very Well. Hbg stable at 8.0.  Objective: Vital signs in last 24 hours: Temp:  [98.1 F (36.7 C)-98.9 F (37.2 C)] 98.1 F (36.7 C) (01/16 0545) Pulse Rate:  [67-85] 85 (01/16 0545) Resp:  [14-20] 16 (01/16 0545) BP: (110-131)/(60-76) 117/76 mmHg (01/16 0545) SpO2:  [97 %-100 %] 97 % (01/16 0545)  Intake/Output from previous day: 01/15 0701 - 01/16 0700 In: 480 [P.O.:480] Out: 600 [Urine:600] Intake/Output this shift:     Recent Labs  01/03/14 1723 01/04/14 0507 01/05/14 0500 01/06/14 0456  HGB 8.5* 6.8* 8.2* 8.0*    Recent Labs  01/05/14 0500 01/06/14 0456  WBC 7.2 7.2  RBC 3.22* 3.14*  HCT 25.6* 25.2*  PLT 208 236    Recent Labs  01/04/14 0507 01/05/14 0500  NA 134* 135*  K 4.1 3.8  CL 101 101  CO2 22 23  BUN 13 12  CREATININE 0.86 0.76  GLUCOSE 144* 97  CALCIUM 8.1* 8.0*   No results found for this basename: LABPT, INR,  in the last 72 hours  Dorsiflexion/Plantar flexion intact  Assessment/Plan: 3 Days Post-Op Procedure(s) (LRB): LEFT TOTAL KNEE ARTHROPLASTY (Left) Up with therapy. DC Saturday.  Jonice Cerra A 01/06/2014, 7:37 AM

## 2014-01-06 NOTE — Progress Notes (Signed)
Physical Therapy Treatment Patient Details Name: [name] MRN: [id number] DOB: [birth year] Today's Date: 01/06/2014 Time: 2440-1027 PT Time Calculation (min): 28 min  PT Assessment / Plan / Recommendation  History of Present Illness  LTKA    PT Comments   Pt has reviewed steps w/ crutches, reviewed exercises.   Follow Up Recommendations  Home health PT     Does the patient have the potential to tolerate intense rehabilitation     Barriers to Discharge        Equipment Recommendations  Crutches    Recommendations for Other Services    Frequency     Progress towards PT Goals Progress towards PT goals: Progressing toward goals  Plan Current plan remains appropriate    Precautions / Restrictions Precautions Precautions: Knee;Fall Restrictions Weight Bearing Restrictions: Yes LLE Weight Bearing: Weight bearing as tolerated   Pertinent Vitals/Pain     Mobility  Transfers Equipment used: Rolling walker (2 wheeled) Transfers: Sit to/from Stand Sit to Stand: Modified independent (Device/Increase time) Stand pivot transfers: Modified independent (Device/Increase time) General transfer comment: pt up at sink washing her hair. Ambulation/Gait Ambulation/Gait assistance: Supervision Ambulation Distance (Feet): 225 Feet Assistive device: Rolling walker (2 wheeled);Crutches General Gait Details: pt ambulated  with RW and crutches Stairs: Yes Stairs assistance: Supervision Stair Management: Forwards;With crutches Number of Stairs: 2 General stair comments: cues for safety on steps using Crutches.    Exercises Total Joint Exercises Quad Sets: AROM;Supine;Right;10 reps Verbally reviewed SLR and heel slides as pt in recliner. Provided written program    PT Diagnosis:    PT Problem List:   PT Treatment Interventions:     PT Goals (current goals can now be found in the care plan section)    Visit Information  Last PT Received On: 01/06/14 Assistance Needed: +1   Subjective Data      Cognition  Cognition Arousal/Alertness: Awake/alert    Balance     End of Session PT - End of Session Activity Tolerance: Patient tolerated treatment well Patient left: in chair;with call bell/phone within reach Nurse Communication: Mobility status   GP     Claretha Cooper 01/06/2014, 11:46 AM Tresa Endo PT [phone]

## 2014-01-09 DIAGNOSIS — IMO0001 Reserved for inherently not codable concepts without codable children: Secondary | ICD-10-CM | POA: Diagnosis not present

## 2014-01-09 DIAGNOSIS — Z471 Aftercare following joint replacement surgery: Secondary | ICD-10-CM | POA: Diagnosis not present

## 2014-01-09 DIAGNOSIS — Z96659 Presence of unspecified artificial knee joint: Secondary | ICD-10-CM | POA: Diagnosis not present

## 2014-01-09 DIAGNOSIS — Z7901 Long term (current) use of anticoagulants: Secondary | ICD-10-CM | POA: Diagnosis not present

## 2014-01-09 NOTE — Discharge Summary (Signed)
Physician Discharge Summary   Patient ID: [name] MRN: [id number] DOB/AGE: [birth year] 48 y.o.  Admit date: 01/03/2014 Discharge date: 01/06/2014  Primary Diagnosis: Osteoarthritis, right knee  Admission Diagnoses:  Past Medical History  Diagnosis Date  . Anemia   . Fibroids   . Thyroid goiter   . Knee joint injury   . Hypertension     no current meds for htn   Discharge Diagnoses:   Active Problems:   Osteoarthritis of left knee   S/P total knee arthroplasty, left   Acute blood loss anemia  Estimated body mass index is 37.3 kg/(m^2) as calculated from the following:   Height as of this encounter: '5\' 6"'  (1.676 m).   Weight as of this encounter: 104.781 kg (231 lb).  Procedure:  Procedure(s) (LRB): LEFT TOTAL KNEE ARTHROPLASTY (Left)   Consults: None  HPI: [name], 48 y.o. female, has a history of pain and functional disability in the left knee due to arthritis and has failed non-surgical conservative treatments for greater than 12 weeks to includeNSAID's and/or analgesics, corticosteriod injections and activity modification. Onset of symptoms was gradual, starting 4 years ago with gradually worsening course since that time. The patient noted prior procedures on the knee to include arthroscopy and menisectomy on the left knee(s). Patient currently rates pain in the left knee(s) at 5 out of 10 with activity. Patient has night pain, worsening of pain with activity and weight bearing, pain that interferes with activities of daily living, pain with passive range of motion, crepitus and joint swelling. Patient has evidence of periarticular osteophytes and joint space narrowing by imaging studies. There is no active infection  Laboratory Data: Admission on 01/03/2014, Discharged on 01/06/2014  Component Date Value Range Status  . Hemoglobin 01/03/2014 8.5* 12.0 - 15.0 g/dL Final  . HCT 01/03/2014 26.9* 36.0 - 46.0 % Final  . WBC 01/04/2014 7.9  4.0 - 10.5 K/uL Final    . RBC 01/04/2014 2.78* 3.87 - 5.11 MIL/uL Final  . Hemoglobin 01/04/2014 6.8* 12.0 - 15.0 g/dL Final   Comment: DELTA CHECK NOTED                          REPEATED TO VERIFY                          CRITICAL RESULT CALLED TO, READ BACK BY AND VERIFIED WITH:                          AGUILAR,JAY RN AT 5366 01.14.15 BY TIBBITTS,KELLY  . HCT 01/04/2014 21.5* 36.0 - 46.0 % Final  . MCV 01/04/2014 77.3* 78.0 - 100.0 fL Final  . MCH 01/04/2014 24.5* 26.0 - 34.0 pg Final  . MCHC 01/04/2014 31.6  30.0 - 36.0 g/dL Final  . RDW 01/04/2014 20.3* 11.5 - 15.5 % Final  . Platelets 01/04/2014 231  150 - 400 K/uL Final  . Sodium 01/04/2014 134* 137 - 147 mEq/L Final  . Potassium 01/04/2014 4.1  3.7 - 5.3 mEq/L Final  . Chloride 01/04/2014 101  96 - 112 mEq/L Final  . CO2 01/04/2014 22  19 - 32 mEq/L Final  . Glucose, Bld 01/04/2014 144* 70 - 99 mg/dL Final  . BUN 01/04/2014 13  6 - 23 mg/dL Final  . Creatinine, Ser 01/04/2014 0.86  0.50 - 1.10 mg/dL Final  . Calcium 01/04/2014 8.1* 8.4 -  10.5 mg/dL Final  . GFR calc non Af Amer 01/04/2014 79* >90 mL/min Final  . GFR calc Af Amer 01/04/2014 >90  >90 mL/min Final   Comment: (NOTE)                          The eGFR has been calculated using the CKD EPI equation.                          This calculation has not been validated in all clinical situations.                          eGFR's persistently <90 mL/min signify possible Chronic Kidney                          Disease.  . Order Confirmation 01/04/2014 ORDER PROCESSED BY BLOOD BANK   Final  . WBC 01/05/2014 7.2  4.0 - 10.5 K/uL Final  . RBC 01/05/2014 3.22* 3.87 - 5.11 MIL/uL Final  . Hemoglobin 01/05/2014 8.2* 12.0 - 15.0 g/dL Final   Comment: POST TRANSFUSION SPECIMEN                          DELTA CHECK NOTED  . HCT 01/05/2014 25.6* 36.0 - 46.0 % Final  . MCV 01/05/2014 79.5  78.0 - 100.0 fL Final  . MCH 01/05/2014 25.5* 26.0 - 34.0 pg Final  . MCHC 01/05/2014 32.0  30.0 - 36.0 g/dL Final  .  RDW 01/05/2014 19.4* 11.5 - 15.5 % Final  . Platelets 01/05/2014 208  150 - 400 K/uL Final  . Sodium 01/05/2014 135* 137 - 147 mEq/L Final  . Potassium 01/05/2014 3.8  3.7 - 5.3 mEq/L Final  . Chloride 01/05/2014 101  96 - 112 mEq/L Final  . CO2 01/05/2014 23  19 - 32 mEq/L Final  . Glucose, Bld 01/05/2014 97  70 - 99 mg/dL Final  . BUN 01/05/2014 12  6 - 23 mg/dL Final  . Creatinine, Ser 01/05/2014 0.76  0.50 - 1.10 mg/dL Final  . Calcium 01/05/2014 8.0* 8.4 - 10.5 mg/dL Final  . GFR calc non Af Amer 01/05/2014 >90  >90 mL/min Final  . GFR calc Af Amer 01/05/2014 >90  >90 mL/min Final   Comment: (NOTE)                          The eGFR has been calculated using the CKD EPI equation.                          This calculation has not been validated in all clinical situations.                          eGFR's persistently <90 mL/min signify possible Chronic Kidney                          Disease.  . WBC 01/06/2014 7.2  4.0 - 10.5 K/uL Final  . RBC 01/06/2014 3.14* 3.87 - 5.11 MIL/uL Final  . Hemoglobin 01/06/2014 8.0* 12.0 - 15.0 g/dL Final  . HCT 01/06/2014 25.2* 36.0 - 46.0 % Final  . MCV 01/06/2014 80.3  78.0 - 100.0 fL Final  .  MCH 01/06/2014 25.5* 26.0 - 34.0 pg Final  . MCHC 01/06/2014 31.7  30.0 - 36.0 g/dL Final  . RDW 01/06/2014 20.4* 11.5 - 15.5 % Final  . Platelets 01/06/2014 236  150 - 400 K/uL Final  Hospital Outpatient Visit on 12/27/2013  Component Date Value Range Status  . aPTT 12/27/2013 27  24 - 37 seconds Final  . Sodium 12/27/2013 137  137 - 147 mEq/L Final  . Potassium 12/27/2013 4.4  3.7 - 5.3 mEq/L Final  . Chloride 12/27/2013 102  96 - 112 mEq/L Final  . CO2 12/27/2013 23  19 - 32 mEq/L Final  . Glucose, Bld 12/27/2013 92  70 - 99 mg/dL Final  . BUN 12/27/2013 17  6 - 23 mg/dL Final  . Creatinine, Ser 12/27/2013 0.84  0.50 - 1.10 mg/dL Final  . Calcium 12/27/2013 8.9  8.4 - 10.5 mg/dL Final  . Total Protein 12/27/2013 7.3  6.0 - 8.3 g/dL Final  . Albumin  12/27/2013 3.6  3.5 - 5.2 g/dL Final  . AST 12/27/2013 12  0 - 37 U/L Final  . ALT 12/27/2013 6  0 - 35 U/L Final  . Alkaline Phosphatase 12/27/2013 69  39 - 117 U/L Final  . Total Bilirubin 12/27/2013 0.3  0.3 - 1.2 mg/dL Final  . GFR calc non Af Amer 12/27/2013 81* >90 mL/min Final  . GFR calc Af Amer 12/27/2013 >90  >90 mL/min Final   Comment: (NOTE)                          The eGFR has been calculated using the CKD EPI equation.                          This calculation has not been validated in all clinical situations.                          eGFR's persistently <90 mL/min signify possible Chronic Kidney                          Disease.  Marland Kitchen Prothrombin Time 12/27/2013 12.5  11.6 - 15.2 seconds Final  . INR 12/27/2013 0.95  0.00 - 1.49 Final  . Color, Urine 12/27/2013 YELLOW  YELLOW Final  . APPearance 12/27/2013 CLOUDY* CLEAR Final  . Specific Gravity, Urine 12/27/2013 1.027  1.005 - 1.030 Final  . pH 12/27/2013 6.5  5.0 - 8.0 Final  . Glucose, UA 12/27/2013 NEGATIVE  NEGATIVE mg/dL Final  . Hgb urine dipstick 12/27/2013 NEGATIVE  NEGATIVE Final  . Bilirubin Urine 12/27/2013 NEGATIVE  NEGATIVE Final  . Ketones, ur 12/27/2013 NEGATIVE  NEGATIVE mg/dL Final  . Protein, ur 12/27/2013 NEGATIVE  NEGATIVE mg/dL Final  . Urobilinogen, UA 12/27/2013 0.2  0.0 - 1.0 mg/dL Final  . Nitrite 12/27/2013 NEGATIVE  NEGATIVE Final  . Leukocytes, UA 12/27/2013 NEGATIVE  NEGATIVE Final   MICROSCOPIC NOT DONE ON URINES WITH NEGATIVE PROTEIN, BLOOD, LEUKOCYTES, NITRITE, OR GLUCOSE <1000 mg/dL.  . WBC 12/27/2013 5.8  4.0 - 10.5 K/uL Final  . RBC 12/27/2013 3.65* 3.87 - 5.11 MIL/uL Final  . Hemoglobin 12/27/2013 8.8* 12.0 - 15.0 g/dL Final  . HCT 12/27/2013 28.1* 36.0 - 46.0 % Final  . MCV 12/27/2013 77.0* 78.0 - 100.0 fL Final  . MCH 12/27/2013 24.1* 26.0 - 34.0 pg Final  . MCHC 12/27/2013  31.3  30.0 - 36.0 g/dL Final  . RDW 12/27/2013 20.5* 11.5 - 15.5 % Final  . Platelets 12/27/2013 255  150 -  400 K/uL Final  . MRSA, PCR 12/27/2013 NEGATIVE  NEGATIVE Final  . Staphylococcus aureus 12/27/2013 NEGATIVE  NEGATIVE Final   Comment:                                 The Xpert SA Assay (FDA                          approved for NASAL specimens                          in patients over 69 years of age),                          is one component of                          a comprehensive surveillance                          program.  Test performance has                          been validated by [employer] for patients greater                          than or equal to 95 year old.                          It is not intended                          to diagnose infection nor to                          guide or monitor treatment.  . ABO/RH(D) 12/27/2013 AB POS   Final  . Antibody Screen 12/27/2013 NEG   Final  . Sample Expiration 12/27/2013 01/06/2014   Final  . Unit Number 12/27/2013 [id number]   Final  . Blood Component Type 12/27/2013 RED CELLS,LR   Final  . Unit division 12/27/2013 00   Final  . Status of Unit 12/27/2013 ISSUED,FINAL   Final  . Transfusion Status 12/27/2013 OK TO TRANSFUSE   Final  . Crossmatch Result 12/27/2013 Compatible   Final  . Unit Number 12/27/2013 [id number]   Final  . Blood Component Type 12/27/2013 RED CELLS,LR   Final  . Unit division 12/27/2013 00   Final  . Status of Unit 12/27/2013 ISSUED,FINAL   Final  . Transfusion Status 12/27/2013 OK TO TRANSFUSE   Final  . Crossmatch Result 12/27/2013 Compatible   Final  . Preg, Serum 12/27/2013 NEGATIVE  NEGATIVE Final   Comment:  THE SENSITIVITY OF THIS                          METHODOLOGY IS >10 mIU/mL.  Admission on 11/21/2013, Discharged on 11/21/2013  Component Date Value Range Status  . Color, Urine 11/21/2013 YELLOW  YELLOW Final  . APPearance 11/21/2013 CLEAR  CLEAR Final  . Specific Gravity, Urine 11/21/2013 1.025  1.005 - 1.030  Final  . pH 11/21/2013 6.0  5.0 - 8.0 Final  . Glucose, UA 11/21/2013 NEGATIVE  NEGATIVE mg/dL Final  . Hgb urine dipstick 11/21/2013 SMALL* NEGATIVE Final  . Bilirubin Urine 11/21/2013 NEGATIVE  NEGATIVE Final  . Ketones, ur 11/21/2013 NEGATIVE  NEGATIVE mg/dL Final  . Protein, ur 11/21/2013 NEGATIVE  NEGATIVE mg/dL Final  . Urobilinogen, UA 11/21/2013 0.2  0.0 - 1.0 mg/dL Final  . Nitrite 11/21/2013 NEGATIVE  NEGATIVE Final  . Leukocytes, UA 11/21/2013 SMALL* NEGATIVE Final  . Preg Test, Ur 11/21/2013 NEGATIVE  NEGATIVE Final   Comment:                                 THE SENSITIVITY OF THIS                          METHODOLOGY IS >24 mIU/mL  . Squamous Epithelial / LPF 11/21/2013 RARE  RARE Final  . WBC, UA 11/21/2013 0-2  <3 WBC/hpf Final  . Bacteria, UA 11/21/2013 RARE  RARE Final  . Yeast Wet Prep HPF POC 11/21/2013 NONE SEEN  NONE SEEN Final  . Trich, Wet Prep 11/21/2013 NONE SEEN  NONE SEEN Final  . Clue Cells Wet Prep HPF POC 11/21/2013 FEW* NONE SEEN Final  . WBC, Wet Prep HPF POC 11/21/2013 MODERATE* NONE SEEN Final   MANY BACTERIA SEEN  . CT Probe RNA 11/21/2013 NEGATIVE  NEGATIVE Final  . GC Probe RNA 11/21/2013 NEGATIVE  NEGATIVE Final   Comment: (NOTE)                                                                                                                                         **Normal Reference Range: Negative**                               Assay performed using the Gen-Probe APTIMA COMBO2 (R) Assay.                          Acceptable specimen types for this assay include APTIMA Swabs (Unisex,                          endocervical, urethral, or vaginal), first void urine, and ThinPrep  liquid based cytology samples.                          Performed at [facility]  . WBC 11/21/2013 5.4  4.0 - 10.5 K/uL Final  . RBC 11/21/2013 3.70* 3.87 - 5.11 MIL/uL Final  . Hemoglobin 11/21/2013 9.1* 12.0 - 15.0 g/dL Final  . HCT  11/21/2013 28.7* 36.0 - 46.0 % Final  . MCV 11/21/2013 77.6* 78.0 - 100.0 fL Final  . MCH 11/21/2013 24.6* 26.0 - 34.0 pg Final  . MCHC 11/21/2013 31.7  30.0 - 36.0 g/dL Final  . RDW 11/21/2013 19.6* 11.5 - 15.5 % Final  . Platelets 11/21/2013 289  150 - 400 K/uL Final  . Specimen Description 11/21/2013 URINE, CLEAN CATCH   Final  . Special Requests 11/21/2013 NONE   Final  . Culture  Setup Time 11/21/2013    Final                   Value:11/21/2013 17:41                         Performed at [facility]  . Colony Count 11/21/2013    Final                   Value:7,000 COLONIES/ML                         Performed at [facility]  . Culture 11/21/2013    Final                   Value:INSIGNIFICANT GROWTH                         Performed at [facility]  . Report Status 11/21/2013 11/22/2013 FINAL   Final     X-Rays:Dg Chest 2 View  12/27/2013   CLINICAL DATA:  Preoperative study, history of smoking and sinus drainage.  EXAM: CHEST  2 VIEW  COMPARISON:  PA and lateral chest of October 15, 2011.  FINDINGS: The lungs are reasonably well inflated. There is no focal infiltrate. The cardiopericardial silhouette is normal in size. The pulmonary vascularity is not engorged. The mediastinum is normal in width. There is no pleural effusion. The observed portions of the bony thorax appear normal.  IMPRESSION: There is no evidence of active cardiopulmonary disease.   Electronically Signed   By: David  Martinique   On: 12/27/2013 14:15   Dg Knee Left Port  01/03/2014   CLINICAL DATA:  Status post left knee replacement.  EXAM: PORTABLE LEFT KNEE - 1-2 VIEW  COMPARISON:  Plain films left knee 01/13/2011.  FINDINGS: The patient has a new left total knee replacement. The device is located and there is no fracture. Gas the soft tissues and surgical drain are noted.  IMPRESSION: Left knee replacement without acute abnormality.   Electronically Signed   By: Inge Rise M.D.   On:  01/03/2014 15:47    EKG: Orders placed during the hospital encounter of 01/03/14  . EKG     Hospital Course: ZOHRA CLAVEL is a 48 y.o. who was admitted to [facility] They were brought to the operating room on 01/03/2014 and underwent Procedure(s): LEFT TOTAL KNEE ARTHROPLASTY.  Patient tolerated the procedure well and was later transferred to the recovery room and then to  the orthopaedic floor for postoperative care.  They were given PO and IV analgesics for pain control following their surgery.  They were given 24 hours of postoperative antibiotics of  Anti-infectives   Start     Dose/Rate Route Frequency Ordered Stop   01/03/14 1900  ceFAZolin (ANCEF) IVPB 1 g/50 mL premix     1 g 100 mL/hr over 30 Minutes Intravenous Every 6 hours 01/03/14 1634 01/04/14 0131   01/03/14 1332  polymyxin B 500,000 Units, bacitracin 50,000 Units in sodium chloride irrigation 0.9 % 500 mL irrigation  Status:  Discontinued       As needed 01/03/14 1332 01/03/14 1521   01/03/14 0940  ceFAZolin (ANCEF) IVPB 2 g/50 mL premix     2 g 100 mL/hr over 30 Minutes Intravenous On call to O.R. 01/03/14 0940 01/03/14 1255     and started on DVT prophylaxis in the form of Xarelto.   PT and OT were ordered for total joint protocol.  Discharge planning consulted to help with postop disposition and equipment needs.  Patient had a difficult night on the evening of surgery due to poor pain control. We had to adjust medications several times, eventually settling back on Percocet. They started to get up OOB with therapy on day one. Hemovac drain was pulled without difficulty.  Continued to work with therapy into day two. By day three, the patient had progressed with therapy and meeting their goals.  Incision was healing well.  Patient was seen in rounds and was ready to go home.   Discharge Medications: Prior to Admission medications   Medication Sig Start Date End Date Taking? Authorizing Provider  ferrous sulfate  325 (65 FE) MG tablet Take 1 tablet (325 mg total) by mouth 3 (three) times daily after meals. 01/05/14   Mancil Pfenning Renelda Loma, PA-C  methocarbamol (ROBAXIN) 500 MG tablet Take 1 tablet (500 mg total) by mouth every 6 (six) hours as needed for muscle spasms. 01/05/14   Renan Danese Renelda Loma, PA-C  oxyCODONE-acetaminophen (PERCOCET) 10-325 MG per tablet Take 1 tablet by mouth every 4 (four) hours as needed for pain. 01/05/14   Kennett Symes Renelda Loma, PA-C  rivaroxaban (XARELTO) 10 MG TABS tablet Take 1 tablet (10 mg total) by mouth daily with breakfast. 01/05/14   Kimyata Milich Renelda Loma, PA-C    Diet: Regular diet Activity:WBAT Follow-up:in 2 weeks Disposition - Home Discharged Condition: stable   Discharge Orders   Future Orders Complete By Expires   Call MD / Call 911  As directed    Comments:     If you experience chest pain or shortness of breath, CALL 911 and be transported to the hospital emergency room.  If you develope a fever above 101 F, pus (white drainage) or increased drainage or redness at the wound, or calf pain, call your surgeon's office.   Constipation Prevention  As directed    Comments:     Drink plenty of fluids.  Prune juice may be helpful.  You may use a stool softener, such as Colace (over the counter) 100 mg twice a day.  Use MiraLax (over the counter) for constipation as needed.   Diet general  As directed    Discharge instructions  As directed    Comments:     Walk with your walker. Weightbearing as tolerated [place] will follow you at home for your therapy  Do not change your dressing unless there is excess drainage Shower only, no tub bath. Call if any  temperatures greater than 101 or any wound complications: [phone] during the day and ask for Dr. Charlestine Night nurse, Brunilda Payor.   Do not put a pillow under the knee. Place it under the heel.  As directed    Driving restrictions  As directed    Comments:     No driving   Increase activity slowly  as tolerated  As directed        Medication List         ferrous sulfate 325 (65 FE) MG tablet  Take 1 tablet (325 mg total) by mouth 3 (three) times daily after meals.     methocarbamol 500 MG tablet  Commonly known as:  ROBAXIN  Take 1 tablet (500 mg total) by mouth every 6 (six) hours as needed for muscle spasms.     oxyCODONE-acetaminophen 10-325 MG per tablet  Commonly known as:  PERCOCET  Take 1 tablet by mouth every 4 (four) hours as needed for pain.     rivaroxaban 10 MG Tabs tablet  Commonly known as:  XARELTO  Take 1 tablet (10 mg total) by mouth daily with breakfast.           Follow-up Information   Follow up with GIOFFRE,RONALD A, MD. Schedule an appointment as soon as possible for a visit in 2 weeks.   Specialty:  Orthopedic Surgery   Contact information:   [address] [place] [address] [phone]       Signed: Ardeen Jourdain [facility] 01/09/2014, 8:54 AM

## 2014-01-10 DIAGNOSIS — Z471 Aftercare following joint replacement surgery: Secondary | ICD-10-CM | POA: Diagnosis not present

## 2014-01-10 DIAGNOSIS — Z96659 Presence of unspecified artificial knee joint: Secondary | ICD-10-CM | POA: Diagnosis not present

## 2014-01-10 DIAGNOSIS — IMO0001 Reserved for inherently not codable concepts without codable children: Secondary | ICD-10-CM | POA: Diagnosis not present

## 2014-01-10 DIAGNOSIS — Z7901 Long term (current) use of anticoagulants: Secondary | ICD-10-CM | POA: Diagnosis not present

## 2014-01-18 DIAGNOSIS — Z7901 Long term (current) use of anticoagulants: Secondary | ICD-10-CM | POA: Diagnosis not present

## 2014-01-18 DIAGNOSIS — IMO0001 Reserved for inherently not codable concepts without codable children: Secondary | ICD-10-CM | POA: Diagnosis not present

## 2014-01-18 DIAGNOSIS — Z96659 Presence of unspecified artificial knee joint: Secondary | ICD-10-CM | POA: Diagnosis not present

## 2014-01-18 DIAGNOSIS — Z471 Aftercare following joint replacement surgery: Secondary | ICD-10-CM | POA: Diagnosis not present

## 2014-01-19 DIAGNOSIS — IMO0001 Reserved for inherently not codable concepts without codable children: Secondary | ICD-10-CM | POA: Diagnosis not present

## 2014-01-19 DIAGNOSIS — Z96659 Presence of unspecified artificial knee joint: Secondary | ICD-10-CM | POA: Diagnosis not present

## 2014-01-19 DIAGNOSIS — Z7901 Long term (current) use of anticoagulants: Secondary | ICD-10-CM | POA: Diagnosis not present

## 2014-01-19 DIAGNOSIS — Z471 Aftercare following joint replacement surgery: Secondary | ICD-10-CM | POA: Diagnosis not present

## 2014-01-20 DIAGNOSIS — IMO0001 Reserved for inherently not codable concepts without codable children: Secondary | ICD-10-CM | POA: Diagnosis not present

## 2014-01-20 DIAGNOSIS — Z7901 Long term (current) use of anticoagulants: Secondary | ICD-10-CM | POA: Diagnosis not present

## 2014-01-20 DIAGNOSIS — Z471 Aftercare following joint replacement surgery: Secondary | ICD-10-CM | POA: Diagnosis not present

## 2014-01-20 DIAGNOSIS — Z96659 Presence of unspecified artificial knee joint: Secondary | ICD-10-CM | POA: Diagnosis not present

## 2014-01-24 DIAGNOSIS — Z96659 Presence of unspecified artificial knee joint: Secondary | ICD-10-CM | POA: Diagnosis not present

## 2014-01-24 DIAGNOSIS — M1710 Unilateral primary osteoarthritis, unspecified knee: Secondary | ICD-10-CM | POA: Diagnosis not present

## 2014-02-06 ENCOUNTER — Ambulatory Visit: Payer: Medicare Other | Attending: Orthopedic Surgery

## 2014-02-06 DIAGNOSIS — M25669 Stiffness of unspecified knee, not elsewhere classified: Secondary | ICD-10-CM | POA: Insufficient documentation

## 2014-02-06 DIAGNOSIS — R609 Edema, unspecified: Secondary | ICD-10-CM | POA: Insufficient documentation

## 2014-02-06 DIAGNOSIS — R5381 Other malaise: Secondary | ICD-10-CM | POA: Diagnosis not present

## 2014-02-06 DIAGNOSIS — Z96659 Presence of unspecified artificial knee joint: Secondary | ICD-10-CM | POA: Insufficient documentation

## 2014-02-06 DIAGNOSIS — IMO0001 Reserved for inherently not codable concepts without codable children: Secondary | ICD-10-CM | POA: Diagnosis not present

## 2014-02-06 DIAGNOSIS — M25569 Pain in unspecified knee: Secondary | ICD-10-CM | POA: Insufficient documentation

## 2014-02-15 ENCOUNTER — Ambulatory Visit: Payer: Medicare Other | Admitting: Physical Therapy

## 2014-02-22 ENCOUNTER — Ambulatory Visit: Payer: Medicare Other | Admitting: Physical Therapy

## 2014-02-27 ENCOUNTER — Ambulatory Visit: Payer: Medicare Other | Admitting: Physical Therapy

## 2014-03-01 ENCOUNTER — Ambulatory Visit: Payer: Medicare Other | Attending: Orthopedic Surgery | Admitting: Physical Therapy

## 2014-03-01 DIAGNOSIS — M25569 Pain in unspecified knee: Secondary | ICD-10-CM | POA: Diagnosis not present

## 2014-03-01 DIAGNOSIS — M25669 Stiffness of unspecified knee, not elsewhere classified: Secondary | ICD-10-CM | POA: Insufficient documentation

## 2014-03-01 DIAGNOSIS — R609 Edema, unspecified: Secondary | ICD-10-CM | POA: Diagnosis not present

## 2014-03-01 DIAGNOSIS — IMO0001 Reserved for inherently not codable concepts without codable children: Secondary | ICD-10-CM | POA: Insufficient documentation

## 2014-03-01 DIAGNOSIS — R5381 Other malaise: Secondary | ICD-10-CM | POA: Insufficient documentation

## 2014-03-01 DIAGNOSIS — Z96659 Presence of unspecified artificial knee joint: Secondary | ICD-10-CM | POA: Insufficient documentation

## 2014-03-07 ENCOUNTER — Telehealth: Payer: Self-pay | Admitting: Internal Medicine

## 2014-03-07 ENCOUNTER — Ambulatory Visit: Payer: Medicare Other

## 2014-03-07 ENCOUNTER — Encounter: Payer: Self-pay | Admitting: Internal Medicine

## 2014-03-07 ENCOUNTER — Ambulatory Visit (INDEPENDENT_AMBULATORY_CARE_PROVIDER_SITE_OTHER): Payer: Medicare Other | Admitting: Internal Medicine

## 2014-03-07 VITALS — BP 150/90 | HR 92 | Temp 97.9°F | Resp 20 | Ht 66.0 in | Wt 234.0 lb

## 2014-03-07 DIAGNOSIS — Z96659 Presence of unspecified artificial knee joint: Secondary | ICD-10-CM | POA: Diagnosis not present

## 2014-03-07 DIAGNOSIS — M1712 Unilateral primary osteoarthritis, left knee: Secondary | ICD-10-CM

## 2014-03-07 DIAGNOSIS — M1990 Unspecified osteoarthritis, unspecified site: Secondary | ICD-10-CM

## 2014-03-07 DIAGNOSIS — R5381 Other malaise: Secondary | ICD-10-CM | POA: Diagnosis not present

## 2014-03-07 DIAGNOSIS — M1710 Unilateral primary osteoarthritis, unspecified knee: Secondary | ICD-10-CM | POA: Diagnosis not present

## 2014-03-07 DIAGNOSIS — I10 Essential (primary) hypertension: Secondary | ICD-10-CM

## 2014-03-07 DIAGNOSIS — F17200 Nicotine dependence, unspecified, uncomplicated: Secondary | ICD-10-CM

## 2014-03-07 DIAGNOSIS — R51 Headache: Secondary | ICD-10-CM

## 2014-03-07 DIAGNOSIS — IMO0001 Reserved for inherently not codable concepts without codable children: Secondary | ICD-10-CM | POA: Diagnosis not present

## 2014-03-07 DIAGNOSIS — R609 Edema, unspecified: Secondary | ICD-10-CM | POA: Diagnosis not present

## 2014-03-07 DIAGNOSIS — M25669 Stiffness of unspecified knee, not elsewhere classified: Secondary | ICD-10-CM | POA: Diagnosis not present

## 2014-03-07 DIAGNOSIS — IMO0002 Reserved for concepts with insufficient information to code with codable children: Secondary | ICD-10-CM

## 2014-03-07 DIAGNOSIS — M25569 Pain in unspecified knee: Secondary | ICD-10-CM | POA: Diagnosis not present
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
## 2014-03-07 NOTE — Progress Notes (Signed)
Pre-visit discussion using our clinic review tool. No additional management support is needed unless otherwise documented below in the visit note.  

## 2014-03-07 NOTE — Telephone Encounter (Signed)
Relevant patient education mailed to patient.  

## 2014-03-07 NOTE — Patient Instructions (Signed)
Please see your eye doctor  to check for glaucoma, or other abnormalities  Call or return to clinic prn if these symptoms worsen or fail to improve as anticipated.  Head CT scan as discussed

## 2014-03-08 ENCOUNTER — Other Ambulatory Visit: Payer: Medicare Other

## 2014-03-08 ENCOUNTER — Telehealth: Payer: Self-pay | Admitting: Internal Medicine

## 2014-03-08 ENCOUNTER — Ambulatory Visit: Payer: Medicare Other | Admitting: Physical Therapy

## 2014-03-08 DIAGNOSIS — Z96659 Presence of unspecified artificial knee joint: Secondary | ICD-10-CM | POA: Diagnosis not present

## 2014-03-08 DIAGNOSIS — R609 Edema, unspecified: Secondary | ICD-10-CM | POA: Diagnosis not present

## 2014-03-08 DIAGNOSIS — M25669 Stiffness of unspecified knee, not elsewhere classified: Secondary | ICD-10-CM | POA: Diagnosis not present

## 2014-03-08 DIAGNOSIS — R5381 Other malaise: Secondary | ICD-10-CM | POA: Diagnosis not present

## 2014-03-08 DIAGNOSIS — IMO0001 Reserved for inherently not codable concepts without codable children: Secondary | ICD-10-CM | POA: Diagnosis not present

## 2014-03-08 DIAGNOSIS — M25569 Pain in unspecified knee: Secondary | ICD-10-CM | POA: Diagnosis not present

## 2014-03-08 NOTE — Telephone Encounter (Signed)
Relevant patient education mailed to patient.  

## 2014-03-09 ENCOUNTER — Other Ambulatory Visit: Payer: Medicare Other

## 2014-03-14 ENCOUNTER — Ambulatory Visit: Payer: Medicare Other

## 2014-03-14 DIAGNOSIS — M25669 Stiffness of unspecified knee, not elsewhere classified: Secondary | ICD-10-CM | POA: Diagnosis not present

## 2014-03-14 DIAGNOSIS — IMO0001 Reserved for inherently not codable concepts without codable children: Secondary | ICD-10-CM | POA: Diagnosis not present

## 2014-03-14 DIAGNOSIS — R5381 Other malaise: Secondary | ICD-10-CM | POA: Diagnosis not present

## 2014-03-14 DIAGNOSIS — R609 Edema, unspecified: Secondary | ICD-10-CM | POA: Diagnosis not present

## 2014-03-14 DIAGNOSIS — M25569 Pain in unspecified knee: Secondary | ICD-10-CM | POA: Diagnosis not present

## 2014-03-14 DIAGNOSIS — Z96659 Presence of unspecified artificial knee joint: Secondary | ICD-10-CM | POA: Diagnosis not present

## 2014-03-15 ENCOUNTER — Ambulatory Visit: Payer: Medicare Other

## 2014-03-15 ENCOUNTER — Ambulatory Visit: Payer: Medicare Other | Admitting: Physical Therapy

## 2014-03-15 DIAGNOSIS — Z96659 Presence of unspecified artificial knee joint: Secondary | ICD-10-CM | POA: Diagnosis not present

## 2014-03-15 DIAGNOSIS — M25569 Pain in unspecified knee: Secondary | ICD-10-CM | POA: Diagnosis not present

## 2014-03-15 DIAGNOSIS — R5381 Other malaise: Secondary | ICD-10-CM | POA: Diagnosis not present

## 2014-03-15 DIAGNOSIS — M25669 Stiffness of unspecified knee, not elsewhere classified: Secondary | ICD-10-CM | POA: Diagnosis not present

## 2014-03-15 DIAGNOSIS — IMO0001 Reserved for inherently not codable concepts without codable children: Secondary | ICD-10-CM | POA: Diagnosis not present

## 2014-03-15 DIAGNOSIS — R609 Edema, unspecified: Secondary | ICD-10-CM | POA: Diagnosis not present

## 2014-03-20 ENCOUNTER — Ambulatory Visit: Payer: Medicare Other | Admitting: Physical Therapy

## 2014-03-20 DIAGNOSIS — M1710 Unilateral primary osteoarthritis, unspecified knee: Secondary | ICD-10-CM | POA: Diagnosis not present

## 2014-03-22 ENCOUNTER — Ambulatory Visit: Payer: Medicare Other | Admitting: Physical Therapy

## 2014-03-22 ENCOUNTER — Ambulatory Visit: Payer: Medicare Other

## 2014-03-22 ENCOUNTER — Ambulatory Visit: Payer: Medicare Other | Admitting: Rehabilitation

## 2014-03-24 ENCOUNTER — Inpatient Hospital Stay: Admission: RE | Admit: 2014-03-24 | Payer: Medicare Other | Source: Ambulatory Visit

## 2014-03-28 ENCOUNTER — Ambulatory Visit: Payer: Medicare Other

## 2014-03-29 ENCOUNTER — Ambulatory Visit: Payer: Medicare Other | Attending: Orthopedic Surgery

## 2014-03-29 DIAGNOSIS — IMO0001 Reserved for inherently not codable concepts without codable children: Secondary | ICD-10-CM | POA: Diagnosis not present

## 2014-03-29 DIAGNOSIS — R609 Edema, unspecified: Secondary | ICD-10-CM | POA: Diagnosis not present

## 2014-03-29 DIAGNOSIS — M25669 Stiffness of unspecified knee, not elsewhere classified: Secondary | ICD-10-CM | POA: Diagnosis not present

## 2014-03-29 DIAGNOSIS — R5381 Other malaise: Secondary | ICD-10-CM | POA: Insufficient documentation

## 2014-03-29 DIAGNOSIS — Z96659 Presence of unspecified artificial knee joint: Secondary | ICD-10-CM | POA: Insufficient documentation

## 2014-03-29 DIAGNOSIS — M25569 Pain in unspecified knee: Secondary | ICD-10-CM | POA: Insufficient documentation

## 2014-04-05 ENCOUNTER — Ambulatory Visit: Payer: Medicare Other

## 2014-04-12 ENCOUNTER — Ambulatory Visit: Payer: Medicare Other

## 2014-04-19 ENCOUNTER — Ambulatory Visit: Payer: Medicare Other

## 2014-04-20 ENCOUNTER — Emergency Department (HOSPITAL_COMMUNITY)
Admission: EM | Admit: 2014-04-20 | Discharge: 2014-04-20 | Disposition: A | Discharge status: Home / Self Care | Payer: Medicare Other | Attending: Emergency Medicine | Admitting: Emergency Medicine

## 2014-04-20 ENCOUNTER — Emergency Department (HOSPITAL_COMMUNITY): Payer: Medicare Other

## 2014-04-20 ENCOUNTER — Encounter (HOSPITAL_COMMUNITY): Payer: Self-pay | Admitting: Emergency Medicine

## 2014-04-20 DIAGNOSIS — Z87828 Personal history of other (healed) physical injury and trauma: Secondary | ICD-10-CM | POA: Diagnosis not present

## 2014-04-20 DIAGNOSIS — I10 Essential (primary) hypertension: Secondary | ICD-10-CM | POA: Diagnosis not present

## 2014-04-20 DIAGNOSIS — F17200 Nicotine dependence, unspecified, uncomplicated: Secondary | ICD-10-CM | POA: Insufficient documentation

## 2014-04-20 DIAGNOSIS — R109 Unspecified abdominal pain: Secondary | ICD-10-CM | POA: Diagnosis not present

## 2014-04-20 DIAGNOSIS — Z862 Personal history of diseases of the blood and blood-forming organs and certain disorders involving the immune mechanism: Secondary | ICD-10-CM | POA: Insufficient documentation

## 2014-04-20 DIAGNOSIS — Z3202 Encounter for pregnancy test, result negative: Secondary | ICD-10-CM | POA: Diagnosis not present

## 2014-04-20 DIAGNOSIS — Z9851 Tubal ligation status: Secondary | ICD-10-CM | POA: Diagnosis not present

## 2014-04-20 DIAGNOSIS — Z8742 Personal history of other diseases of the female genital tract: Secondary | ICD-10-CM | POA: Insufficient documentation

## 2014-04-20 DIAGNOSIS — R1032 Left lower quadrant pain: Secondary | ICD-10-CM | POA: Diagnosis not present

## 2014-04-20 DIAGNOSIS — N898 Other specified noninflammatory disorders of vagina: Secondary | ICD-10-CM | POA: Insufficient documentation

## 2014-04-20 DIAGNOSIS — Z79899 Other long term (current) drug therapy: Secondary | ICD-10-CM | POA: Diagnosis not present

## 2014-04-20 DIAGNOSIS — Z8639 Personal history of other endocrine, nutritional and metabolic disease: Secondary | ICD-10-CM | POA: Insufficient documentation

## 2014-04-20 LAB — COMPREHENSIVE METABOLIC PANEL
ALK PHOS: 105 U/L (ref 39–117)
ALT: 10 U/L (ref 0–35)
AST: 15 U/L (ref 0–37)
Albumin: 3.9 g/dL (ref 3.5–5.2)
BILIRUBIN TOTAL: 0.5 mg/dL (ref 0.3–1.2)
BUN: 16 mg/dL (ref 6–23)
CHLORIDE: 99 meq/L (ref 96–112)
CO2: 23 mEq/L (ref 19–32)
CREATININE: 1.04 mg/dL (ref 0.50–1.10)
Calcium: 9.2 mg/dL (ref 8.4–10.5)
GFR calc non Af Amer: 62 mL/min — ABNORMAL LOW (ref >90)
GFR, EST AFRICAN AMERICAN: 72 mL/min — AB (ref >90)
Glucose, Bld: 83 mg/dL (ref 70–99)
Potassium: 4.2 mEq/L (ref 3.7–5.3)
Sodium: 136 mEq/L — ABNORMAL LOW (ref 137–147)
TOTAL PROTEIN: 7.8 g/dL (ref 6.0–8.3)

## 2014-04-20 LAB — URINALYSIS, ROUTINE W REFLEX MICROSCOPIC
Glucose, UA: NEGATIVE mg/dL
Hgb urine dipstick: NEGATIVE
KETONES UR: NEGATIVE mg/dL
Leukocytes, UA: NEGATIVE
Nitrite: NEGATIVE
Protein, ur: NEGATIVE mg/dL
Specific Gravity, Urine: 1.029 (ref 1.005–1.030)
UROBILINOGEN UA: 1 mg/dL (ref 0.0–1.0)
pH: 6 (ref 5.0–8.0)

## 2014-04-20 LAB — WET PREP, GENITAL
CLUE CELLS WET PREP: NONE SEEN
Trich, Wet Prep: NONE SEEN

## 2014-04-20 LAB — CBC WITH DIFFERENTIAL/PLATELET
BASOS PCT: 1 % (ref 0–1)
Basophils Absolute: 0 10*3/uL (ref 0.0–0.1)
EOS ABS: 0.2 10*3/uL (ref 0.0–0.7)
Eosinophils Relative: 4 % (ref 0–5)
HEMATOCRIT: 29.8 % — AB (ref 36.0–46.0)
HEMOGLOBIN: 9.3 g/dL — AB (ref 12.0–15.0)
LYMPHS ABS: 1.3 10*3/uL (ref 0.7–4.0)
Lymphocytes Relative: 24 % (ref 12–46)
MCH: 24.2 pg — AB (ref 26.0–34.0)
MCHC: 31.2 g/dL (ref 30.0–36.0)
MCV: 77.4 fL — ABNORMAL LOW (ref 78.0–100.0)
MONO ABS: 0.5 10*3/uL (ref 0.1–1.0)
MONOS PCT: 9 % (ref 3–12)
Neutro Abs: 3.4 10*3/uL (ref 1.7–7.7)
Neutrophils Relative %: 63 % (ref 43–77)
Platelets: 341 10*3/uL (ref 150–400)
RBC: 3.85 MIL/uL — ABNORMAL LOW (ref 3.87–5.11)
RDW: 19.2 % — ABNORMAL HIGH (ref 11.5–15.5)
WBC: 5.4 10*3/uL (ref 4.0–10.5)

## 2014-04-20 LAB — POC URINE PREG, ED: PREG TEST UR: NEGATIVE

## 2014-04-20 MED ORDER — IOHEXOL 300 MG/ML  SOLN
100.0000 mL | Freq: Once | INTRAMUSCULAR | Status: AC | PRN
  Administered 2014-04-20: 100 mL via INTRAVENOUS

## 2014-04-20 MED ORDER — FLUCONAZOLE 150 MG PO TABS
150.0000 mg | ORAL_TABLET | Freq: Once | ORAL | Status: AC
  Administered 2014-04-20: 150 mg via ORAL
  Filled 2014-04-20: qty 1

## 2014-04-20 MED ORDER — HYDROCODONE-ACETAMINOPHEN 5-325 MG PO TABS: 1.0000 | ORAL_TABLET | Freq: Four times a day (QID) | ORAL | Status: DC | PRN

## 2014-04-20 MED ORDER — MORPHINE SULFATE 4 MG/ML IJ SOLN
4.0000 mg | Freq: Once | INTRAMUSCULAR | Status: AC
  Administered 2014-04-20: 4 mg via INTRAVENOUS
  Filled 2014-04-20: qty 1

## 2014-04-20 MED ORDER — ONDANSETRON HCL 4 MG/2ML IJ SOLN
4.0000 mg | Freq: Once | INTRAMUSCULAR | Status: AC
  Administered 2014-04-20: 4 mg via INTRAVENOUS
  Filled 2014-04-20: qty 2

## 2014-04-20 MED ORDER — IOHEXOL 300 MG/ML  SOLN
50.0000 mL | Freq: Once | INTRAMUSCULAR | Status: AC | PRN
  Administered 2014-04-20: 50 mL via ORAL

## 2014-04-20 NOTE — ED Notes (Signed)
Per pt, states RLQ pain for a hour, feels lightheaded, no N/V

## 2014-04-20 NOTE — Discharge Instructions (Signed)
Abdominal Pain, Women °Abdominal (stomach, pelvic, or belly) pain can be caused by many things. It is important to tell your doctor: °· The location of the pain. °· Does it come and go or is it present all the time? °· Are there things that start the pain (eating certain foods, exercise)? °· Are there other symptoms associated with the pain (fever, nausea, vomiting, diarrhea)? °All of this is helpful to know when trying to find the cause of the pain. °CAUSES  °· Stomach: virus or bacteria infection, or ulcer. °· Intestine: appendicitis (inflamed appendix), regional ileitis (Crohn's disease), ulcerative colitis (inflamed colon), irritable bowel syndrome, diverticulitis (inflamed diverticulum of the colon), or cancer of the stomach or intestine. °· Gallbladder disease or stones in the gallbladder. °· Kidney disease, kidney stones, or infection. °· Pancreas infection or cancer. °· Fibromyalgia (pain disorder). °· Diseases of the female organs: °· Uterus: fibroid (non-cancerous) tumors or infection. °· Fallopian tubes: infection or tubal pregnancy. °· Ovary: cysts or tumors. °· Pelvic adhesions (scar tissue). °· Endometriosis (uterus lining tissue growing in the pelvis and on the pelvic organs). °· Pelvic congestion syndrome (female organs filling up with blood just before the menstrual period). °· Pain with the menstrual period. °· Pain with ovulation (producing an egg). °· Pain with an IUD (intrauterine device, birth control) in the uterus. °· Cancer of the female organs. °· Functional pain (pain not caused by a disease, may improve without treatment). °· Psychological pain. °· Depression. °DIAGNOSIS  °Your doctor will decide the seriousness of your pain by doing an examination. °· Blood tests. °· X-rays. °· Ultrasound. °· CT scan (computed tomography, special type of X-ray). °· MRI (magnetic resonance imaging). °· Cultures, for infection. °· Barium enema (dye inserted in the large intestine, to better view it with  X-rays). °· Colonoscopy (looking in intestine with a lighted tube). °· Laparoscopy (minor surgery, looking in abdomen with a lighted tube). °· Major abdominal exploratory surgery (looking in abdomen with a large incision). °TREATMENT  °The treatment will depend on the cause of the pain.  °· Many cases can be observed and treated at home. °· Over-the-counter medicines recommended by your caregiver. °· Prescription medicine. °· Antibiotics, for infection. °· Birth control pills, for painful periods or for ovulation pain. °· Hormone treatment, for endometriosis. °· Nerve blocking injections. °· Physical therapy. °· Antidepressants. °· Counseling with a psychologist or psychiatrist. °· Minor or major surgery. °HOME CARE INSTRUCTIONS  °· Do not take laxatives, unless directed by your caregiver. °· Take over-the-counter pain medicine only if ordered by your caregiver. Do not take aspirin because it can cause an upset stomach or bleeding. °· Try a clear liquid diet (broth or water) as ordered by your caregiver. Slowly move to a bland diet, as tolerated, if the pain is related to the stomach or intestine. °· Have a thermometer and take your temperature several times a day, and record it. °· Bed rest and sleep, if it helps the pain. °· Avoid sexual intercourse, if it causes pain. °· Avoid stressful situations. °· Keep your follow-up appointments and tests, as your caregiver orders. °· If the pain does not go away with medicine or surgery, you may try: °· Acupuncture. °· Relaxation exercises (yoga, meditation). °· Group therapy. °· Counseling. °SEEK MEDICAL CARE IF:  °· You notice certain foods cause stomach pain. °· Your home care treatment is not helping your pain. °· You need stronger pain medicine. °· You want your IUD removed. °· You feel faint or   lightheaded. °· You develop nausea and vomiting. °· You develop a rash. °· You are having side effects or an allergy to your medicine. °SEEK IMMEDIATE MEDICAL CARE IF:  °· Your  pain does not go away or gets worse. °· You have a fever. °· Your pain is felt only in portions of the abdomen. The right side could possibly be appendicitis. The left lower portion of the abdomen could be colitis or diverticulitis. °· You are passing blood in your stools (bright red or black tarry stools, with or without vomiting). °· You have blood in your urine. °· You develop chills, with or without a fever. °· You pass out. °MAKE SURE YOU:  °· Understand these instructions. °· Will watch your condition. °· Will get help right away if you are not doing well or get worse. °Document Released: 10/05/2007 Document Revised: 03/01/2012 Document Reviewed: 10/25/2009 °ExitCare® Patient Information ©2014 [employer]. ° °

## 2014-04-20 NOTE — ED Notes (Signed)
Pt states that she would rather not have IV right now. Explained to pt the reason for IV. Pt anxious for IV at this time. Dr made aware.

## 2014-04-20 NOTE — ED Provider Notes (Signed)
CSN: [id number]     Arrival date & time 04/20/14  1832 History   First MD Initiated Contact with Patient 04/20/14 1854     Chief Complaint  Patient presents with  . Abdominal Pain     (Consider location/radiation/quality/duration/timing/severity/associated sxs/prior Treatment) HPI  This is a 48 year old female with a history of fibroids and hypertension who presents with right lower quadrant pain. She reports dull right-sided lower quadrant pain for the last hour. She states that radiates into her right flank. She denies any dysuria or hematuria. She denies any similar to symptoms in the past. Currently her symptoms are 9/10. She states that she has not taken anything for the pain. No known history of kidney stones. She denies any nausea, vomiting, diarrhea. She denies any fevers.  Past Medical History  Diagnosis Date  . Anemia   . Fibroids   . Thyroid goiter   . Knee joint injury   . Hypertension     no current meds for htn   Past Surgical History  Procedure Laterality Date  . Ankle surgery Right 2009    broken ankle   . Tubal ligation    . Replacement total knee Right 2012  . Arthroscopic surgery to knee Bilateral   . 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 Hypertension Mother   . Hypertension Father   . Cancer Maternal Grandmother     ? lung   History  Substance Use Topics  . Smoking status: Current Every Day Smoker -- 0.50 packs/day for 20 years    Types: Cigarettes  . Smokeless tobacco: Never Used  . Alcohol Use: 1.0 oz/week    2 drink(s) per week     Comment: occasionally once per month   OB History   Grav Para Term Preterm Abortions TAB SAB Ect Mult Living   4 2 2  2 1 1   2      Review of Systems  Constitutional: Negative for fever.  Respiratory: Negative for cough,  chest tightness and shortness of breath.   Cardiovascular: Negative for chest pain.  Gastrointestinal: Positive for abdominal pain. Negative for nausea, vomiting, diarrhea and constipation.  Genitourinary: Positive for flank pain. Negative for dysuria and hematuria.  Musculoskeletal: Negative for back pain.  Skin: Negative for rash.  Neurological: Negative for headaches.  Psychiatric/Behavioral: Negative for confusion.  All other systems reviewed and are negative.     Allergies  Tramadol  Home Medications   Prior to Admission medications   Medication Sig Start Date End Date Taking? Authorizing Provider  HYDROcodone-acetaminophen (NORCO/VICODIN) 5-325 MG per tablet Take 1 tablet by mouth every 6 (six) hours as needed for severe pain. 04/20/14   Merryl Hacker, MD   BP 148/90  Pulse 74  Temp(Src) 98.4 F (36.9 C) (Oral)  Resp 20  SpO2 100%  LMP 04/05/2014 Physical Exam  Nursing note and vitals reviewed. Constitutional: She is oriented to person, place, and time. She appears well-developed and well-nourished.  HENT:  Head: Normocephalic and atraumatic.  Cardiovascular: Normal rate, regular rhythm and normal heart sounds.   No murmur heard. Pulmonary/Chest: Effort normal and breath sounds normal. No respiratory distress. She has no wheezes.  Abdominal: Soft. Bowel sounds are normal. There is tenderness. There is no rebound and no guarding.  Tenderness to palpation over the  right flank, no significant several right lower quadrant  Genitourinary: Vagina normal and uterus normal.  Scant vaginal discharge, no cervical motion tenderness, no adnexal tenderness  Neurological: She is alert and oriented to person, place, and time.  Skin: Skin is warm and dry.  Psychiatric: She has a normal mood and affect.    ED Course  Procedures (including critical care time) Labs Review Labs Reviewed  WET PREP, GENITAL - Abnormal; Notable for the following:    Yeast Wet Prep HPF POC FEW (*)     WBC, Wet Prep HPF POC FEW (*)    All other components within normal limits  CBC WITH DIFFERENTIAL - Abnormal; Notable for the following:    RBC 3.85 (*)    Hemoglobin 9.3 (*)    HCT 29.8 (*)    MCV 77.4 (*)    MCH 24.2 (*)    RDW 19.2 (*)    All other components within normal limits  COMPREHENSIVE METABOLIC PANEL - Abnormal; Notable for the following:    Sodium 136 (*)    GFR calc non Af Amer 62 (*)    GFR calc Af Amer 72 (*)    All other components within normal limits  URINALYSIS, ROUTINE W REFLEX MICROSCOPIC - Abnormal; Notable for the following:    Color, Urine AMBER (*)    Bilirubin Urine SMALL (*)    All other components within normal limits  GC/CHLAMYDIA PROBE AMP  RPR  HIV ANTIBODY (ROUTINE TESTING)  POC URINE PREG, ED    Imaging Review Ct Abdomen Pelvis W Contrast  04/20/2014   CLINICAL DATA:  Left lower quadrant abdominal pain without nausea and vomiting.  EXAM: CT ABDOMEN AND PELVIS WITH CONTRAST  TECHNIQUE: Multidetector CT imaging of the abdomen and pelvis was performed using the standard protocol following bolus administration of intravenous contrast.  CONTRAST:  166mL OMNIPAQUE IOHEXOL 300 MG/ML SOLN intravenously; 9mL OMNIPAQUE IOHEXOL 300 MG/ML SOLN orally  COMPARISON:  CT scan of the abdomen and pelvis dated December 17, 2010.  FINDINGS: The stomach is partially distended with food and oral contrast. Contrast has traversed approximately 1/2 of the small bowel. More distally the unopacified small bowel loops appear normal. A normal caliber gas-filled appendix is demonstrated. The colon demonstrates a normal stool and gas pattern. There is no evidence of colitis or diverticulitis. There are no findings to suggest obstruction of the small or large bowel.  The liver, gallbladder, pancreas, spleen, and kidneys are normal in appearance. On delayed images contrast within the renal collecting systems is normal. There is mild stable fullness of the left adrenal gland. The  right adrenal gland is normal in appearance. The caliber of the abdominal aorta is normal. Within the pelvis the partially distended urinary bladder is normal in appearance. The uterus is retroverted. There may be an approximately 1.6 cm diameter left ovarian cyst. There is no free pelvic fluid. There is no inguinal hernia. There is a tiny fat containing umbilical hernia.  The lumbar vertebral bodies are preserved in height. There is mild disc space narrowing at L4-5. The bony pelvis exhibits no acute abnormality. The lung bases are clear.  IMPRESSION: 1. There are no findings to explain the patient's symptoms. There is no evidence of bowel obstruction or acute inflammation. No acute urinary tract abnormality is demonstrated. 2. There is mild stable enlargement of the left adrenal gland likely reflecting an adenoma. There is no acute hepatobiliary abnormality.   Electronically Signed   By: David  Martinique  On: 04/20/2014 21:57     EKG Interpretation None      MDM   Final diagnoses:  Right flank pain   Patient presents with right flank and right lower quadrant pain. She is nontoxic on exam and vital signs are within normal limits. On exam she is tender to palpation over the right flank. GU exam is reassuring. Lab work is within normal limits including urinalysis. She does have several yeast on wet prep. Patient was given Diflucan.  CT abdomen obtained to evaluate for post kidney stone and acute appendicitis. CT is negative. She has no adnexal tenderness on GU exam and have low suspicion at this time for torsion. Discussed results with patient. Patient was able to tolerate by mouth prior to discharge. We'll send with a short course of pain medication. Patient to followup with her PCP for repeat abdominal exam.  After history, exam, and medical workup I feel the patient has been appropriately medically screened and is safe for discharge home. Pertinent diagnoses were discussed with the patient. Patient  was given return precautions.     Merryl Hacker, MD 04/20/14 [phone]

## 2014-04-21 LAB — RPR

## 2014-04-21 LAB — GC/CHLAMYDIA PROBE AMP
CT Probe RNA: NEGATIVE
GC Probe RNA: NEGATIVE

## 2014-04-21 LAB — HIV ANTIBODY (ROUTINE TESTING W REFLEX): HIV: NONREACTIVE

## 2014-04-25 DIAGNOSIS — Z96659 Presence of unspecified artificial knee joint: Secondary | ICD-10-CM | POA: Diagnosis not present

## 2014-04-25 DIAGNOSIS — M1710 Unilateral primary osteoarthritis, unspecified knee: Secondary | ICD-10-CM | POA: Diagnosis not present

## 2014-04-26 ENCOUNTER — Telehealth: Payer: Self-pay | Admitting: Internal Medicine

## 2014-04-26 DIAGNOSIS — M1990 Unspecified osteoarthritis, unspecified site: Secondary | ICD-10-CM

## 2014-04-26 NOTE — Telephone Encounter (Signed)
[place] is referring the pt to [facility], her appt in 05/01/14 at 10.  [facility] states they need a verbal okay from the pt's PCP since she has [place] [facility].  Please call Inez Catalina at [facility],  [phone], to give the "okay".

## 2014-04-26 NOTE — Telephone Encounter (Signed)
ok 

## 2014-04-26 NOTE — Telephone Encounter (Signed)
Dr. Raliegh Ip, see message and advise if okay for referral.

## 2014-04-27 NOTE — Telephone Encounter (Signed)
Left detailed message for [name] on personal voicemail, okay for Pain Management referral for pt. Order for referral put in EPIC.

## 2014-05-25 DIAGNOSIS — Z96659 Presence of unspecified artificial knee joint: Secondary | ICD-10-CM | POA: Diagnosis not present

## 2014-06-27 DIAGNOSIS — IMO0002 Reserved for concepts with insufficient information to code with codable children: Secondary | ICD-10-CM | POA: Diagnosis not present

## 2014-06-27 DIAGNOSIS — G894 Chronic pain syndrome: Secondary | ICD-10-CM | POA: Diagnosis not present

## 2014-06-27 DIAGNOSIS — Z79899 Other long term (current) drug therapy: Secondary | ICD-10-CM | POA: Diagnosis not present

## 2014-06-27 DIAGNOSIS — M79609 Pain in unspecified limb: Secondary | ICD-10-CM | POA: Diagnosis not present

## 2014-06-28 ENCOUNTER — Inpatient Hospital Stay (HOSPITAL_COMMUNITY)
Admission: AD | Admit: 2014-06-28 | Discharge: 2014-06-28 | Disposition: A | Discharge status: Home / Self Care | Payer: Medicare Other | Source: Ambulatory Visit | Attending: Obstetrics & Gynecology | Admitting: Obstetrics & Gynecology

## 2014-06-28 ENCOUNTER — Encounter (HOSPITAL_COMMUNITY): Payer: Self-pay | Admitting: *Deleted

## 2014-06-28 DIAGNOSIS — A499 Bacterial infection, unspecified: Secondary | ICD-10-CM

## 2014-06-28 DIAGNOSIS — I10 Essential (primary) hypertension: Secondary | ICD-10-CM | POA: Insufficient documentation

## 2014-06-28 DIAGNOSIS — D259 Leiomyoma of uterus, unspecified: Secondary | ICD-10-CM | POA: Diagnosis not present

## 2014-06-28 DIAGNOSIS — B9689 Other specified bacterial agents as the cause of diseases classified elsewhere: Secondary | ICD-10-CM | POA: Insufficient documentation

## 2014-06-28 DIAGNOSIS — E049 Nontoxic goiter, unspecified: Secondary | ICD-10-CM | POA: Insufficient documentation

## 2014-06-28 DIAGNOSIS — N949 Unspecified condition associated with female genital organs and menstrual cycle: Secondary | ICD-10-CM | POA: Insufficient documentation

## 2014-06-28 DIAGNOSIS — Z8249 Family history of ischemic heart disease and other diseases of the circulatory system: Secondary | ICD-10-CM | POA: Diagnosis not present

## 2014-06-28 DIAGNOSIS — R1032 Left lower quadrant pain: Secondary | ICD-10-CM | POA: Diagnosis not present

## 2014-06-28 DIAGNOSIS — N946 Dysmenorrhea, unspecified: Secondary | ICD-10-CM | POA: Diagnosis not present

## 2014-06-28 DIAGNOSIS — N760 Acute vaginitis: Secondary | ICD-10-CM | POA: Insufficient documentation

## 2014-06-28 DIAGNOSIS — F17200 Nicotine dependence, unspecified, uncomplicated: Secondary | ICD-10-CM | POA: Insufficient documentation

## 2014-06-28 LAB — URINALYSIS, ROUTINE W REFLEX MICROSCOPIC
Bilirubin Urine: NEGATIVE
Glucose, UA: NEGATIVE mg/dL
Ketones, ur: NEGATIVE mg/dL
Leukocytes, UA: NEGATIVE
NITRITE: NEGATIVE
PROTEIN: NEGATIVE mg/dL
Specific Gravity, Urine: 1.015 (ref 1.005–1.030)
UROBILINOGEN UA: 0.2 mg/dL (ref 0.0–1.0)
pH: 8 (ref 5.0–8.0)

## 2014-06-28 LAB — WET PREP, GENITAL
Trich, Wet Prep: NONE SEEN
YEAST WET PREP: NONE SEEN

## 2014-06-28 LAB — URINE MICROSCOPIC-ADD ON

## 2014-06-28 LAB — POCT PREGNANCY, URINE: Preg Test, Ur: NEGATIVE

## 2014-06-28 MED ORDER — METRONIDAZOLE 500 MG PO TABS: 500.0000 mg | ORAL_TABLET | Freq: Two times a day (BID) | ORAL | Status: DC

## 2014-06-28 NOTE — MAU Note (Signed)
Denies vaginal discharge but having vaginal pain and odor; c/o pain with urination;

## 2014-06-28 NOTE — Discharge Instructions (Signed)
Bacterial Vaginosis Bacterial vaginosis is a vaginal infection that occurs when the normal balance of bacteria in the vagina is disrupted. It results from an overgrowth of certain bacteria. This is the most common vaginal infection in women of childbearing age. Treatment is important to prevent complications, especially in pregnant women, as it can cause a premature delivery. CAUSES  Bacterial vaginosis is caused by an increase in harmful bacteria that are normally present in smaller amounts in the vagina. Several different kinds of bacteria can cause bacterial vaginosis. However, the reason that the condition develops is not fully understood. RISK FACTORS Certain activities or behaviors can put you at an increased risk of developing bacterial vaginosis, including:  Having a new sex partner or multiple sex partners.  Douching.  Using an intrauterine device (IUD) for contraception. Women do not get bacterial vaginosis from toilet seats, bedding, swimming pools, or contact with objects around them. SIGNS AND SYMPTOMS  Some women with bacterial vaginosis have no signs or symptoms. Common symptoms include:  Grey vaginal discharge.  A fishlike odor with discharge, especially after sexual intercourse.  Itching or burning of the vagina and vulva.  Burning or pain with urination. DIAGNOSIS  Your health care provider will take a medical history and examine the vagina for signs of bacterial vaginosis. A sample of vaginal fluid may be taken. Your health care provider will look at this sample under a microscope to check for bacteria and abnormal cells. A vaginal pH test may also be done.  TREATMENT  Bacterial vaginosis may be treated with antibiotic medicines. These may be given in the form of a pill or a vaginal cream. A second round of antibiotics may be prescribed if the condition comes back after treatment.  HOME CARE INSTRUCTIONS   Only take over-the-counter or prescription medicines as  directed by your health care provider.  If antibiotic medicine was prescribed, take it as directed. Make sure you finish it even if you start to feel better.  Do not have sex until treatment is completed.  Tell all sexual partners that you have a vaginal infection. They should see their health care provider and be treated if they have problems, such as a mild rash or itching.  Practice safe sex by using condoms and only having one sex partner. SEEK MEDICAL CARE IF:   Your symptoms are not improving after 3 days of treatment.  You have increased discharge or pain.  You have a fever. MAKE SURE YOU:   Understand these instructions.  Will watch your condition.  Will get help right away if you are not doing well or get worse. FOR MORE INFORMATION  Centers for Disease Control and Prevention, Division of STD Prevention: www.cdc.gov/std American Sexual Health Association (ASHA): www.ashastd.org  Document Released: 12/08/2005 Document Revised: 09/28/2013 Document Reviewed: 07/20/2013 ExitCare Patient Information 2015 [employer]. This information is not intended to replace advice given to you by your health care provider. Make sure you discuss any questions you have with your health care provider.  

## 2014-06-28 NOTE — MAU Note (Signed)
C/o upper L abdominal pain that pt describes as achy,burning,constant pain; c/o UTI symptoms; concerned that she might have an STD;

## 2014-06-28 NOTE — MAU Provider Note (Signed)
History     CSN: [id number]  Arrival date and time: 06/28/14 0930   First Provider Initiated Contact with Patient 06/28/14 1000      Chief Complaint  Patient presents with  . Vaginal Pain   HPI [name] is a 48 y.o. [id number] who presents to MAU today with complaint of vaginal odor and LLQ pain. The patient states that she noted a milky white discharge ~ 1-2 weeks ago and treated herself with OTC monistat. She states that discharge resolved. She had recent intercourse with a new partner and then began having brown spotting x 1 week, dysuria and LLQ pain x 2 days. She denies urinary frequency, flank pain or fever. She has had nausea without vomiting. She is concerned about a vaginal odor without discharge. She states LMP of 05/28/14. Patient also has a history of douching.   OB History   Grav Para Term Preterm Abortions TAB SAB Ect Mult Living   4 2 2  2 1 1   2       Past Medical History  Diagnosis Date  . Anemia   . Fibroids   . Thyroid goiter   . Knee joint injury   . Hypertension     no current meds for htn    Past Surgical History  Procedure Laterality Date  . Ankle surgery Right 2009    broken ankle   . Tubal ligation    . Replacement total knee Right 2012  . Arthroscopic surgery to knee Bilateral   . Total knee arthroplasty Left 01/03/2014    Procedure: LEFT TOTAL KNEE ARTHROPLASTY;  Surgeon: Tobi Bastos, MD;  Location: [facility];  Service: Orthopedics;  Laterality: Left;    Family History  Problem Relation Age of Onset  . Hypertension Mother   . Hypertension Father   . Cancer Maternal Grandmother     ? lung    History  Substance Use Topics  . Smoking status: Current Every Day Smoker -- 0.50 packs/day for 20 years    Types: Cigarettes  . Smokeless tobacco: Never Used  . Alcohol Use: 1.0 oz/week    2 drink(s) per week     Comment: occasionally once per month    Allergies:  Allergies  Allergen Reactions  . Tramadol Itching    No  prescriptions prior to admission    Review of Systems  Constitutional: Negative for fever and malaise/fatigue.  Gastrointestinal: Positive for nausea and abdominal pain. Negative for vomiting, diarrhea and constipation.  Genitourinary: Positive for dysuria. Negative for urgency, frequency, hematuria and flank pain.       + vaginal bleeding, discharge   Physical Exam   Blood pressure 133/88, pulse 77, temperature 98.6 F (37 C), temperature source Oral, resp. rate 18, height 5\' 6"  (1.676 m), weight 229 lb (103.874 kg).  Physical Exam  Constitutional: She is oriented to person, place, and time. She appears well-developed and well-nourished. No distress.  HENT:  Head: Normocephalic and atraumatic.  Cardiovascular: Normal rate.   Respiratory: Effort normal.  GI: Soft. She exhibits no distension and no mass. There is tenderness (mild tenderness to palpation of the LLQ). There is no rebound and no guarding.  Genitourinary: Uterus is not enlarged and not tender. Cervix exhibits no motion tenderness, no discharge and no friability. Right adnexum displays no mass and no tenderness. Left adnexum displays tenderness. Left adnexum displays no mass. There is bleeding (small amount of blood in the vaginal vault oozing from the os) around the vagina.  No vaginal discharge found.  Neurological: She is alert and oriented to person, place, and time.  Skin: Skin is warm and dry. No erythema.  Psychiatric: She has a normal mood and affect.   Results for orders placed during the hospital encounter of 06/28/14 (from the past 24 hour(s))  URINALYSIS, ROUTINE W REFLEX MICROSCOPIC     Status: Abnormal   Collection Time    06/28/14  9:35 AM      Result Value Ref Range   Color, Urine YELLOW  YELLOW   APPearance CLEAR  CLEAR   Specific Gravity, Urine 1.015  1.005 - 1.030   pH 8.0  5.0 - 8.0   Glucose, UA NEGATIVE  NEGATIVE mg/dL   Hgb urine dipstick LARGE (*) NEGATIVE   Bilirubin Urine NEGATIVE  NEGATIVE    Ketones, ur NEGATIVE  NEGATIVE mg/dL   Protein, ur NEGATIVE  NEGATIVE mg/dL   Urobilinogen, UA 0.2  0.0 - 1.0 mg/dL   Nitrite NEGATIVE  NEGATIVE   Leukocytes, UA NEGATIVE  NEGATIVE  URINE MICROSCOPIC-ADD ON     Status: Abnormal   Collection Time    06/28/14  9:35 AM      Result Value Ref Range   Squamous Epithelial / LPF FEW (*) RARE   WBC, UA 0-2  <3 WBC/hpf   RBC / HPF 3-6  <3 RBC/hpf   Urine-Other AMORPHOUS URATES/PHOSPHATES    WET PREP, GENITAL     Status: Abnormal   Collection Time    06/28/14 10:25 AM      Result Value Ref Range   Yeast Wet Prep HPF POC NONE SEEN  NONE SEEN   Trich, Wet Prep NONE SEEN  NONE SEEN   Clue Cells Wet Prep HPF POC MANY (*) NONE SEEN   WBC, Wet Prep HPF POC FEW (*) NONE SEEN  POCT PREGNANCY, URINE     Status: None   Collection Time    06/28/14 10:40 AM      Result Value Ref Range   Preg Test, Ur NEGATIVE  NEGATIVE    MAU Course  Procedures None  MDM UPT - negative UA, wet prep, GC/Chlamydia today  Assessment and Plan  A: Dysmenorrhea Bacterial vaginosis  P: Discharge home Rx for Flagyl sent to patient's pharmacy Patient advised to take Ibuprofen PRN pain Patient advised to follow-up with PCP as needed Patient may return to MAU as needed or if her condition were to change or worsen   Farris Has, PA-C  06/28/2014, 10:59 AM

## 2014-06-29 LAB — GC/CHLAMYDIA PROBE AMP
CT PROBE, AMP APTIMA: NEGATIVE
GC PROBE AMP APTIMA: NEGATIVE

## 2014-06-29 LAB — URINE CULTURE: Colony Count: 90000

## 2014-07-24 ENCOUNTER — Other Ambulatory Visit: Payer: Medicare Other

## 2014-07-24 ENCOUNTER — Other Ambulatory Visit: Payer: Self-pay | Admitting: Pain Medicine

## 2014-07-24 DIAGNOSIS — IMO0002 Reserved for concepts with insufficient information to code with codable children: Secondary | ICD-10-CM

## 2014-07-24 DIAGNOSIS — M1710 Unilateral primary osteoarthritis, unspecified knee: Secondary | ICD-10-CM

## 2014-07-30 ENCOUNTER — Other Ambulatory Visit: Payer: Medicare Other

## 2014-08-01 ENCOUNTER — Inpatient Hospital Stay: Admission: RE | Admit: 2014-08-01 | Payer: Medicare Other | Source: Ambulatory Visit

## 2014-08-04 ENCOUNTER — Other Ambulatory Visit: Payer: Medicare Other

## 2014-08-14 DIAGNOSIS — Z96659 Presence of unspecified artificial knee joint: Secondary | ICD-10-CM | POA: Diagnosis not present

## 2014-10-23 ENCOUNTER — Encounter (HOSPITAL_COMMUNITY): Payer: Self-pay | Admitting: *Deleted

## 2014-10-23 DIAGNOSIS — H269 Unspecified cataract: Secondary | ICD-10-CM | POA: Diagnosis not present

## 2014-10-24 DIAGNOSIS — M542 Cervicalgia: Secondary | ICD-10-CM | POA: Diagnosis not present

## 2014-10-24 DIAGNOSIS — R220 Localized swelling, mass and lump, head: Secondary | ICD-10-CM | POA: Diagnosis not present

## 2014-10-24 DIAGNOSIS — R221 Localized swelling, mass and lump, neck: Secondary | ICD-10-CM | POA: Diagnosis not present

## 2014-10-24 DIAGNOSIS — R490 Dysphonia: Secondary | ICD-10-CM | POA: Diagnosis not present

## 2014-10-24 DIAGNOSIS — E049 Nontoxic goiter, unspecified: Secondary | ICD-10-CM | POA: Diagnosis not present

## 2014-10-25 ENCOUNTER — Other Ambulatory Visit: Payer: Self-pay | Admitting: Otolaryngology

## 2014-10-25 DIAGNOSIS — D497 Neoplasm of unspecified behavior of endocrine glands and other parts of nervous system: Secondary | ICD-10-CM

## 2014-11-03 ENCOUNTER — Ambulatory Visit
Admission: RE | Admit: 2014-11-03 | Discharge: 2014-11-03 | Disposition: A | Discharge status: Home / Self Care | Payer: Medicare Other | Source: Ambulatory Visit | Attending: Otolaryngology | Admitting: Otolaryngology

## 2014-11-03 DIAGNOSIS — E042 Nontoxic multinodular goiter: Secondary | ICD-10-CM | POA: Diagnosis not present

## 2014-11-03 DIAGNOSIS — D497 Neoplasm of unspecified behavior of endocrine glands and other parts of nervous system: Secondary | ICD-10-CM

## 2014-11-03 DIAGNOSIS — M542 Cervicalgia: Secondary | ICD-10-CM | POA: Diagnosis not present

## 2014-11-03 DIAGNOSIS — Z85850 Personal history of malignant neoplasm of thyroid: Secondary | ICD-10-CM | POA: Diagnosis not present

## 2014-11-03 DIAGNOSIS — E049 Nontoxic goiter, unspecified: Secondary | ICD-10-CM | POA: Diagnosis not present

## 2014-11-20 ENCOUNTER — Encounter (HOSPITAL_COMMUNITY): Payer: Self-pay

## 2014-11-20 ENCOUNTER — Emergency Department (HOSPITAL_COMMUNITY)
Admission: EM | Admit: 2014-11-20 | Discharge: 2014-11-20 | Disposition: A | Discharge status: Home / Self Care | Payer: Medicare Other | Attending: Emergency Medicine | Admitting: Emergency Medicine

## 2014-11-20 ENCOUNTER — Emergency Department (HOSPITAL_COMMUNITY): Payer: Medicare Other

## 2014-11-20 DIAGNOSIS — I10 Essential (primary) hypertension: Secondary | ICD-10-CM | POA: Insufficient documentation

## 2014-11-20 DIAGNOSIS — Z720 Tobacco use: Secondary | ICD-10-CM | POA: Diagnosis not present

## 2014-11-20 DIAGNOSIS — S6991XA Unspecified injury of right wrist, hand and finger(s), initial encounter: Secondary | ICD-10-CM | POA: Insufficient documentation

## 2014-11-20 DIAGNOSIS — T1490XA Injury, unspecified, initial encounter: Secondary | ICD-10-CM

## 2014-11-20 DIAGNOSIS — Y9289 Other specified places as the place of occurrence of the external cause: Secondary | ICD-10-CM | POA: Diagnosis not present

## 2014-11-20 DIAGNOSIS — Z792 Long term (current) use of antibiotics: Secondary | ICD-10-CM | POA: Diagnosis not present

## 2014-11-20 DIAGNOSIS — W228XXA Striking against or struck by other objects, initial encounter: Secondary | ICD-10-CM | POA: Insufficient documentation

## 2014-11-20 DIAGNOSIS — Y998 Other external cause status: Secondary | ICD-10-CM | POA: Insufficient documentation

## 2014-11-20 DIAGNOSIS — Z8639 Personal history of other endocrine, nutritional and metabolic disease: Secondary | ICD-10-CM | POA: Insufficient documentation

## 2014-11-20 DIAGNOSIS — M79641 Pain in right hand: Secondary | ICD-10-CM

## 2014-11-20 DIAGNOSIS — Z862 Personal history of diseases of the blood and blood-forming organs and certain disorders involving the immune mechanism: Secondary | ICD-10-CM | POA: Diagnosis not present

## 2014-11-20 DIAGNOSIS — Y9389 Activity, other specified: Secondary | ICD-10-CM | POA: Diagnosis not present

## 2014-11-20 DIAGNOSIS — Z87828 Personal history of other (healed) physical injury and trauma: Secondary | ICD-10-CM | POA: Diagnosis not present

## 2014-11-20 DIAGNOSIS — Z8742 Personal history of other diseases of the female genital tract: Secondary | ICD-10-CM | POA: Diagnosis not present

## 2014-11-20 DIAGNOSIS — M7989 Other specified soft tissue disorders: Secondary | ICD-10-CM | POA: Diagnosis not present

## 2014-11-20 MED ORDER — HYDROCODONE-ACETAMINOPHEN 5-325 MG PO TABS: 1.0000 | ORAL_TABLET | Freq: Four times a day (QID) | ORAL | Status: DC | PRN

## 2014-11-20 MED ORDER — HYDROCODONE-ACETAMINOPHEN 5-325 MG PO TABS
2.0000 | ORAL_TABLET | Freq: Once | ORAL | Status: AC
  Administered 2014-11-20: 2 via ORAL
  Filled 2014-11-20: qty 2

## 2014-11-20 NOTE — Discharge Instructions (Signed)

## 2014-11-20 NOTE — ED Provider Notes (Signed)
CSN: [id number]     Arrival date & time 11/20/14  0808 History   First MD Initiated Contact with Patient 11/20/14 [phone]     Chief Complaint  Patient presents with  . Hand Pain     (Consider location/radiation/quality/duration/timing/severity/associated sxs/prior Treatment) HPI Comments: Hit her hand on something last week, unknown what happened. Persistent pain in R hand. She is R handed. Pain with making a fist, flexing 2nd and 3rd fingers.  Patient is a 47 y.o. female presenting with hand pain. The history is provided by the patient.  Hand Pain This is a new problem. The current episode started more than 2 days ago. The problem occurs constantly. The problem has been gradually worsening. Pertinent negatives include no chest pain, no abdominal pain, no headaches and no shortness of breath. Nothing aggravates the symptoms. Nothing relieves the symptoms. She has tried nothing for the symptoms.    Past Medical History  Diagnosis Date  . Anemia   . Fibroids   . Thyroid goiter   . Knee joint injury   . Hypertension     no current meds for htn   Past Surgical History  Procedure Laterality Date  . Ankle surgery Right 2009    broken ankle   . Tubal ligation    . Replacement total knee Right 2012  . Arthroscopic surgery to knee Bilateral   . 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 Hypertension Mother   . Hypertension Father   . Cancer Maternal Grandmother     ? lung   History  Substance Use Topics  . Smoking status: Current Every Day Smoker -- 0.50 packs/day for 20 years    Types: Cigarettes  . Smokeless tobacco: Never Used  . Alcohol Use: 1.0 oz/week    2 drink(s) per week     Comment: occasionally once per month   OB History    Gravida Para Term Preterm AB TAB SAB Ectopic Multiple  Living   4 2 2  2 1 1   2      Review of Systems  Constitutional: Negative for fever and chills.  Respiratory: Negative for shortness of breath.   Cardiovascular: Negative for chest pain.  Gastrointestinal: Negative for abdominal pain.  Neurological: Negative for headaches.  All other systems reviewed and are negative.     Allergies  Tramadol  Home Medications   Prior to Admission medications   Medication Sig Start Date End Date Taking? Authorizing Provider  metroNIDAZOLE (FLAGYL) 500 MG tablet Take 1 tablet (500 mg total) by mouth 2 (two) times daily. 06/28/14   Luvenia Redden, PA-C   BP 178/106 mmHg  Pulse 80  Temp(Src) 98 F (36.7 C) (Oral)  Resp 18  SpO2 100%  LMP 10/31/2014 Physical Exam  Constitutional: She is oriented to person, place, and time. She appears well-developed and well-nourished. No distress.  HENT:  Head: Normocephalic and atraumatic.  Mouth/Throat: Oropharynx is clear and moist.  Eyes: EOM are normal. Pupils are equal, round, and reactive to light.  Neck: Normal range of motion. Neck supple.  Cardiovascular: Normal rate and regular rhythm.  Exam reveals no friction rub.   No murmur heard. Pulmonary/Chest: Effort normal and breath sounds normal. No respiratory distress. She has no wheezes. She has no rales.  Abdominal: Soft. She exhibits no  distension. There is no tenderness. There is no rebound.  Musculoskeletal: She exhibits no edema.       Right hand: She exhibits decreased range of motion (decreased flexin of 2nd and 3rd fingers), tenderness, bony tenderness (2nd and 3rd metacarpals. 2nd and 3rd MCP joints) and swelling. She exhibits normal capillary refill, no deformity and no laceration. Normal sensation noted. Normal strength noted.       Hands: Neurological: She is alert and oriented to person, place, and time.  Skin: She is not diaphoretic.  Nursing note and vitals reviewed.   ED Course  Procedures (including critical care time) Labs  Review Labs Reviewed - No data to display  Imaging Review Dg Hand Complete Right  11/20/2014   CLINICAL DATA:  Struck RIGHT hand against door 1 week ago, persistent soft tissue swelling and pain at fourth/fifth metacarpal region  EXAM: RIGHT HAND - COMPLETE 3+ VIEW  COMPARISON:  08/29/2009  FINDINGS: Soft tissue swelling overlying the dorsum of the hand at the level of the MCP joints.  Osseous mineralization normal.  Joint space narrowing and minimal spur formation at IP joint thumb.  Joint spaces otherwise preserved.  No acute fracture, dislocation or bone destruction.  IMPRESSION: No acute osseous abnormalities.  Mild degenerative changes at IP joint RIGHT thumb.   Electronically Signed   By: Lavonia Dana M.D.   On: 11/20/2014 08:43     EKG Interpretation None      MDM   Final diagnoses:  Right hand pain    1F here with hand pain. Worsening ove rpast several days. Unknown exact trauma, but she thinks she hit it something over the past few days. Pain with palpation of 2nd and 3rd metacarpals, MCP joints. Will xray. NVI. Xray negative. Will give mild amount of pain medicine. No need for aspiration, doubt gout as no hx of gout. No signs of infection.     Evelina Bucy, MD 11/20/14 [phone]

## 2014-11-20 NOTE — ED Notes (Signed)
Per pt, hand injury to rt hand.  Hurt it against something last week but cannot recall what she struck with hand. Pain continues and not feeling better.

## 2014-11-27 DIAGNOSIS — S63412A Traumatic rupture of collateral ligament of right middle finger at metacarpophalangeal and interphalangeal joint, initial encounter: Secondary | ICD-10-CM | POA: Diagnosis not present

## 2014-11-27 DIAGNOSIS — S63412D Traumatic rupture of collateral ligament of right middle finger at metacarpophalangeal and interphalangeal joint, subsequent encounter: Secondary | ICD-10-CM | POA: Diagnosis not present

## 2014-12-11 ENCOUNTER — Other Ambulatory Visit: Payer: Self-pay | Admitting: Otolaryngology

## 2014-12-11 DIAGNOSIS — E049 Nontoxic goiter, unspecified: Secondary | ICD-10-CM

## 2014-12-25 DIAGNOSIS — S63412D Traumatic rupture of collateral ligament of right middle finger at metacarpophalangeal and interphalangeal joint, subsequent encounter: Secondary | ICD-10-CM | POA: Diagnosis not present

## 2014-12-28 ENCOUNTER — Ambulatory Visit
Admission: RE | Admit: 2014-12-28 | Discharge: 2014-12-28 | Disposition: A | Discharge status: Home / Self Care | Payer: Medicare Other | Source: Ambulatory Visit | Attending: Otolaryngology | Admitting: Otolaryngology

## 2014-12-28 ENCOUNTER — Other Ambulatory Visit (HOSPITAL_COMMUNITY)
Admission: RE | Admit: 2014-12-28 | Discharge: 2014-12-28 | Disposition: A | Discharge status: Home / Self Care | Payer: Medicare Other | Source: Ambulatory Visit | Attending: Interventional Radiology | Admitting: Interventional Radiology

## 2014-12-28 DIAGNOSIS — E049 Nontoxic goiter, unspecified: Secondary | ICD-10-CM

## 2014-12-28 DIAGNOSIS — E0789 Other specified disorders of thyroid: Secondary | ICD-10-CM | POA: Diagnosis not present

## 2014-12-28 DIAGNOSIS — E079 Disorder of thyroid, unspecified: Secondary | ICD-10-CM | POA: Insufficient documentation

## 2014-12-28 DIAGNOSIS — E042 Nontoxic multinodular goiter: Secondary | ICD-10-CM | POA: Diagnosis not present

## 2015-01-03 DIAGNOSIS — M79644 Pain in right finger(s): Secondary | ICD-10-CM | POA: Diagnosis not present

## 2015-01-18 DIAGNOSIS — S63412D Traumatic rupture of collateral ligament of right middle finger at metacarpophalangeal and interphalangeal joint, subsequent encounter: Secondary | ICD-10-CM | POA: Diagnosis not present

## 2015-01-19 DIAGNOSIS — Y929 Unspecified place or not applicable: Secondary | ICD-10-CM | POA: Diagnosis not present

## 2015-01-19 DIAGNOSIS — S63412D Traumatic rupture of collateral ligament of right middle finger at metacarpophalangeal and interphalangeal joint, subsequent encounter: Secondary | ICD-10-CM | POA: Diagnosis not present

## 2015-01-19 DIAGNOSIS — S63654A Sprain of metacarpophalangeal joint of right ring finger, initial encounter: Secondary | ICD-10-CM | POA: Diagnosis not present

## 2015-01-19 DIAGNOSIS — X58XXXA Exposure to other specified factors, initial encounter: Secondary | ICD-10-CM | POA: Diagnosis not present

## 2015-02-07 ENCOUNTER — Inpatient Hospital Stay (HOSPITAL_COMMUNITY)
Admission: AD | Admit: 2015-02-07 | Discharge: 2015-02-07 | Disposition: A | Discharge status: Home / Self Care | Payer: Medicare Other | Source: Ambulatory Visit | Attending: Obstetrics & Gynecology | Admitting: Obstetrics & Gynecology

## 2015-02-07 ENCOUNTER — Encounter (HOSPITAL_COMMUNITY): Payer: Self-pay | Admitting: *Deleted

## 2015-02-07 DIAGNOSIS — N6452 Nipple discharge: Secondary | ICD-10-CM | POA: Diagnosis not present

## 2015-02-07 DIAGNOSIS — N644 Mastodynia: Secondary | ICD-10-CM

## 2015-02-07 DIAGNOSIS — B9689 Other specified bacterial agents as the cause of diseases classified elsewhere: Secondary | ICD-10-CM | POA: Diagnosis not present

## 2015-02-07 DIAGNOSIS — N760 Acute vaginitis: Secondary | ICD-10-CM | POA: Insufficient documentation

## 2015-02-07 LAB — URINE MICROSCOPIC-ADD ON

## 2015-02-07 LAB — URINALYSIS, ROUTINE W REFLEX MICROSCOPIC
Bilirubin Urine: NEGATIVE
Glucose, UA: NEGATIVE mg/dL
HGB URINE DIPSTICK: NEGATIVE
KETONES UR: NEGATIVE mg/dL
Nitrite: NEGATIVE
Protein, ur: NEGATIVE mg/dL
Specific Gravity, Urine: 1.02 (ref 1.005–1.030)
UROBILINOGEN UA: 0.2 mg/dL (ref 0.0–1.0)
pH: 7.5 (ref 5.0–8.0)

## 2015-02-07 LAB — WET PREP, GENITAL
Trich, Wet Prep: NONE SEEN
WBC, Wet Prep HPF POC: NONE SEEN
Yeast Wet Prep HPF POC: NONE SEEN

## 2015-02-07 LAB — POCT PREGNANCY, URINE: Preg Test, Ur: NEGATIVE

## 2015-02-07 MED ORDER — METRONIDAZOLE 500 MG PO TABS: 500.0000 mg | ORAL_TABLET | Freq: Two times a day (BID) | ORAL | Status: DC

## 2015-02-07 NOTE — MAU Note (Signed)
Pt presents complaining of itching and discharge from her breasts with sharp pains that goes back and forth from the right and left breast. Pt is also having abdominal pain and vaginal discharge with odor and itching.

## 2015-02-07 NOTE — MAU Provider Note (Signed)
  History     CSN: [id number]  Arrival date and time: 02/07/15 1353   First Provider Initiated Contact with Patient 02/07/15 1618      Chief Complaint  Patient presents with  . Breast Discharge  . Vaginal Discharge  . Pelvic Pain   HPI  [id number] presents to the MAU stating that she had some fluid leaking from her left breast at some point in the past 2 weeks. She states no fluid came from her right breast. She is also complaining of a foul vaginal odor after menses and having lower abdominal pain for approximately 1 week.   Past Medical History  Diagnosis Date  . Hypertension     Past Surgical History  Procedure Laterality Date  . Cesarean section    . Replacement total knee    . Hand surgery    . Ankle surgery    . Tubal ligation      History reviewed. No pertinent family history.  History  Substance Use Topics  . Smoking status: Current Every Day Smoker -- 0.50 packs/day    Types: Cigarettes  . Smokeless tobacco: Not on file  . Alcohol Use: Yes     Comment: socially    Allergies:  Allergies  Allergen Reactions  . Tramadol Itching    No prescriptions prior to admission    Review of Systems  Constitutional: Negative.   HENT: Negative.   Gastrointestinal: Positive for abdominal pain. Negative for nausea and vomiting.       Lower abdominal pain  Genitourinary: Negative for dysuria, urgency and frequency.  Skin: Positive for itching.       Itching on both breasts. C/O leaking fluid from left breast x 1 day   Physical Exam   Blood pressure 136/87, pulse 67, temperature 98.3 F (36.8 C), temperature source Oral, resp. rate 18, height 5\' 6"  (1.676 m), weight 111.131 kg (245 lb), last menstrual period 01/28/2015.  Physical Exam  Constitutional: She is oriented to person, place, and time. She appears well-developed and well-nourished.  HENT:  Head: Normocephalic and atraumatic.  Neck: Normal range of motion.  Cardiovascular: Normal rate.    Respiratory: Effort normal. No respiratory distress. She exhibits no tenderness. Right breast exhibits no inverted nipple, no mass, no nipple discharge, no skin change and no tenderness. Left breast exhibits no inverted nipple, no mass, no nipple discharge, no skin change and no tenderness. Breasts are symmetrical.  GI: Soft. Bowel sounds are normal. She exhibits no distension and no mass. There is no tenderness. There is no rebound and no guarding.  Genitourinary: Vaginal discharge found.  White discharge and foul odor noted.  Musculoskeletal: Normal range of motion.  Neurological: She is alert and oriented to person, place, and time.  Skin: Skin is warm and dry.  Psychiatric: She has a normal mood and affect. Her behavior is normal. Judgment and thought content normal.    MAU Course  Procedures  WET Prep GC/Chlamydia Schedule OP Mammogram Breast Exam  Assessment and Plan  Breast Discharge/ Pain Bacterial Vaginitis  Clemmons,Lori Grissett 02/07/2015, 4:34 PM

## 2015-02-07 NOTE — Discharge Instructions (Signed)
Bacterial Vaginosis Bacterial vaginosis is a vaginal infection that occurs when the normal balance of bacteria in the vagina is disrupted. It results from an overgrowth of certain bacteria. This is the most common vaginal infection in women of childbearing age. Treatment is important to prevent complications, especially in pregnant women, as it can cause a premature delivery. CAUSES  Bacterial vaginosis is caused by an increase in harmful bacteria that are normally present in smaller amounts in the vagina. Several different kinds of bacteria can cause bacterial vaginosis. However, the reason that the condition develops is not fully understood. RISK FACTORS Certain activities or behaviors can put you at an increased risk of developing bacterial vaginosis, including:  Having a new sex partner or multiple sex partners.  Douching.  Using an intrauterine device (IUD) for contraception. Women do not get bacterial vaginosis from toilet seats, bedding, swimming pools, or contact with objects around them. SIGNS AND SYMPTOMS  Some women with bacterial vaginosis have no signs or symptoms. Common symptoms include:  Grey vaginal discharge.  A fishlike odor with discharge, especially after sexual intercourse.  Itching or burning of the vagina and vulva.  Burning or pain with urination. DIAGNOSIS  Your health care provider will take a medical history and examine the vagina for signs of bacterial vaginosis. A sample of vaginal fluid may be taken. Your health care provider will look at this sample under a microscope to check for bacteria and abnormal cells. A vaginal pH test may also be done.  TREATMENT  Bacterial vaginosis may be treated with antibiotic medicines. These may be given in the form of a pill or a vaginal cream. A second round of antibiotics may be prescribed if the condition comes back after treatment.  HOME CARE INSTRUCTIONS   Only take over-the-counter or prescription medicines as  directed by your health care provider.  If antibiotic medicine was prescribed, take it as directed. Make sure you finish it even if you start to feel better.  Do not have sex until treatment is completed.  Tell all sexual partners that you have a vaginal infection. They should see their health care provider and be treated if they have problems, such as a mild rash or itching.  Practice safe sex by using condoms and only having one sex partner. SEEK MEDICAL CARE IF:   Your symptoms are not improving after 3 days of treatment.  You have increased discharge or pain.  You have a fever. MAKE SURE YOU:   Understand these instructions.  Will watch your condition.  Will get help right away if you are not doing well or get worse. FOR MORE INFORMATION  Centers for Disease Control and Prevention, Division of STD Prevention: Appraiser[employer]raud[employer] American Sexual Health Association (ASHA): www.ashastd.org  Document Released: 12/08/2005 Document Revised: 09/28/2013 Document Reviewed: 07/20/2013 [facility] Patient Information 2015 [place], [place]. This information is not intended to replace advice given to you by your health care provider. Make sure you discuss any questions you have with your health care provider. Galactorrhea Galactorrhea is when there is a milky nipple discharge. It is different from normal milk in nursing mothers. It usually comes from both nipples. Galactorrhea is not a disease but may be a symptom of a problem. It may continue for years after weaning. Galactorrhea is caused by the hormone prolactin, which stimulates milk production. If the breast discharge looks like pus, is bloody or if there is a lump present in the affected breast, the discharge may be caused by other problems including:  A benign cyst.  Papilloma.  Breast cancer.  A breast infection.  A breast abscess. It can also be seen in men who have a low or absent female hormone (testosterone) level. Galactorrhea can  be present in a newborn if the mother had high female hormone (estrogen) levels that crossed into the baby through the placenta. The baby usually has enlarged breasts, but in time, it all goes away on its own. CAUSES   Tumor of the pituitary gland in the brain.  Problems with the hypothalamus in the brain that stimulates the pituitary gland.  Low thyroid function (hypothyroid disease).  Chronic kidney failure.  Medications, antidepressants, tranquilizers and blood pressure medication.  Herbal medications (nettle, fennel, blessed thistle, anise and fenugreek seed).  Illegal drugs (marijuana and opiates).  Breast stimulation during sexual activity or too many and frequent self breast exams.  Birth control pills.  Surgery or trauma to the breast causing nerve damage.  Spinal cord injury. SYMPTOMS   White, yellow or green discharge from one or both breasts.  No menstrual period (amenorrhea) or infrequent menstrual periods (hypomenorrhea).  Hot flashes, lack of sexual desire or vaginal dryness.  Infertility in women and men.  Headaches and vision problems.  Decrease in calcium in your bones (developing osteopenia or osteoporosis). DIAGNOSIS  Your caregiver may be able to know your problem by taking a detailed history and physical exam of you. Tests that may be done, include:  Blood tests to check for the prolactin hormone, your female and thyroid hormones and a pregnancy test.  A detailed eye exam.  Mammogram.  X-rays, CT scan or MRI of breasts or your brain looking for a tumor. TREATMENT   Stopping medications that may be causing the galactorrhea.  Treating low thyroid function with thyroid hormones.  Medical or surgical (if necessary) treatment of a pituitary gland tumor.  Medication to lower the prolactin hormone level when no cause can be found.  Surgery as a last resort to remove the breasts ducts if the discharge persists with treatment and is a  problem.  Treatment may not be necessary if you are not bothered by the breast discharge. HOME CARE INSTRUCTIONS   Before seeing your caregiver, make a list of all your symptoms, medications, when the breast discharge started and questions you may have.  Avoid breast stimulation during sexual activity.  Perform breast self exam once a month.  Avoid clothes that rub on your nipples.  Use breasts pads to absorb the discharge.  Wear a support bra. SEEK MEDICAL CARE IF:   You have galactorrhea and you are trying to get pregnant.  You develop hot flashes, vaginal dryness or lack of sexual desire.  You stop having menstrual periods or they are irregular or far apart.  You have headaches.  You have vision problems. SEEK IMMEDIATE MEDICAL CARE IF:   Your breast discharge is bloody or pus-like.  You have breast pain.  You feel a lump in your breast.  Your breast shows wrinkling or dimpling.  Your breast becomes red and swollen. Document Released: 01/15/2005 Document Revised: 03/01/2012 Document Reviewed: 11/28/2008 [facility] Patient Information 2015 [place], [place]. This information is not intended to replace advice given to you by your health care provider. Make sure you discuss any questions you have with your health care provider.

## 2015-02-08 ENCOUNTER — Other Ambulatory Visit (HOSPITAL_COMMUNITY): Payer: Self-pay | Admitting: Certified Nurse Midwife

## 2015-02-08 DIAGNOSIS — N6452 Nipple discharge: Secondary | ICD-10-CM

## 2015-02-08 DIAGNOSIS — N644 Mastodynia: Secondary | ICD-10-CM

## 2015-02-08 LAB — GC/CHLAMYDIA PROBE AMP (~~LOC~~) NOT AT ARMC
Chlamydia: NEGATIVE
Neisseria Gonorrhea: NEGATIVE

## 2015-02-08 LAB — HIV ANTIBODY (ROUTINE TESTING W REFLEX): HIV Screen 4th Generation wRfx: NONREACTIVE

## 2015-03-06 ENCOUNTER — Other Ambulatory Visit: Payer: Medicare Other

## 2015-04-03 DIAGNOSIS — M79644 Pain in right finger(s): Secondary | ICD-10-CM | POA: Diagnosis not present

## 2015-04-03 DIAGNOSIS — S63412D Traumatic rupture of collateral ligament of right middle finger at metacarpophalangeal and interphalangeal joint, subsequent encounter: Secondary | ICD-10-CM | POA: Diagnosis not present

## 2015-04-03 DIAGNOSIS — M62441 Contracture of muscle, right hand: Secondary | ICD-10-CM | POA: Diagnosis not present

## 2015-04-03 DIAGNOSIS — M24541 Contracture, right hand: Secondary | ICD-10-CM | POA: Diagnosis not present

## 2015-04-19 ENCOUNTER — Emergency Department (HOSPITAL_COMMUNITY)
Admission: EM | Admit: 2015-04-19 | Discharge: 2015-04-19 | Disposition: A | Discharge status: Home / Self Care | Payer: Medicare Other | Attending: Emergency Medicine | Admitting: Emergency Medicine

## 2015-04-19 ENCOUNTER — Encounter (HOSPITAL_COMMUNITY): Payer: Self-pay | Admitting: Emergency Medicine

## 2015-04-19 DIAGNOSIS — B9689 Other specified bacterial agents as the cause of diseases classified elsewhere: Secondary | ICD-10-CM | POA: Diagnosis not present

## 2015-04-19 DIAGNOSIS — Z3202 Encounter for pregnancy test, result negative: Secondary | ICD-10-CM | POA: Insufficient documentation

## 2015-04-19 DIAGNOSIS — Z862 Personal history of diseases of the blood and blood-forming organs and certain disorders involving the immune mechanism: Secondary | ICD-10-CM | POA: Diagnosis not present

## 2015-04-19 DIAGNOSIS — N760 Acute vaginitis: Secondary | ICD-10-CM | POA: Diagnosis not present

## 2015-04-19 DIAGNOSIS — L299 Pruritus, unspecified: Secondary | ICD-10-CM | POA: Insufficient documentation

## 2015-04-19 DIAGNOSIS — Z720 Tobacco use: Secondary | ICD-10-CM | POA: Diagnosis not present

## 2015-04-19 DIAGNOSIS — Z9851 Tubal ligation status: Secondary | ICD-10-CM | POA: Insufficient documentation

## 2015-04-19 DIAGNOSIS — R109 Unspecified abdominal pain: Secondary | ICD-10-CM | POA: Diagnosis present

## 2015-04-19 DIAGNOSIS — Z8639 Personal history of other endocrine, nutritional and metabolic disease: Secondary | ICD-10-CM | POA: Diagnosis not present

## 2015-04-19 DIAGNOSIS — I10 Essential (primary) hypertension: Secondary | ICD-10-CM | POA: Diagnosis not present

## 2015-04-19 DIAGNOSIS — Z86018 Personal history of other benign neoplasm: Secondary | ICD-10-CM | POA: Insufficient documentation

## 2015-04-19 LAB — URINALYSIS, ROUTINE W REFLEX MICROSCOPIC
Bilirubin Urine: NEGATIVE
GLUCOSE, UA: NEGATIVE mg/dL
HGB URINE DIPSTICK: NEGATIVE
Ketones, ur: NEGATIVE mg/dL
Leukocytes, UA: NEGATIVE
Nitrite: NEGATIVE
PH: 6.5 (ref 5.0–8.0)
Protein, ur: NEGATIVE mg/dL
SPECIFIC GRAVITY, URINE: 1.029 (ref 1.005–1.030)
Urobilinogen, UA: 1 mg/dL (ref 0.0–1.0)

## 2015-04-19 LAB — WET PREP, GENITAL
TRICH WET PREP: NONE SEEN
YEAST WET PREP: NONE SEEN

## 2015-04-19 LAB — POC URINE PREG, ED: PREG TEST UR: NEGATIVE

## 2015-04-19 MED ORDER — HYDROXYZINE HCL 25 MG PO TABS
25.0000 mg | ORAL_TABLET | Freq: Once | ORAL | Status: AC
  Administered 2015-04-19: 25 mg via ORAL
  Filled 2015-04-19: qty 1

## 2015-04-19 MED ORDER — METRONIDAZOLE 500 MG PO TABS: 500.0000 mg | ORAL_TABLET | Freq: Two times a day (BID) | ORAL | Status: DC

## 2015-04-19 MED ORDER — METRONIDAZOLE 500 MG PO TABS
500.0000 mg | ORAL_TABLET | Freq: Once | ORAL | Status: AC
  Administered 2015-04-19: 500 mg via ORAL
  Filled 2015-04-19: qty 1

## 2015-04-19 MED ORDER — HYDROXYZINE HCL 25 MG PO TABS: 25.0000 mg | ORAL_TABLET | Freq: Three times a day (TID) | ORAL | Status: DC | PRN

## 2015-04-19 NOTE — ED Notes (Signed)
Pt reports vaginal itching and lower abdominal pain for 2 weeks. Pt continues to report generalized itching as well.

## 2015-04-19 NOTE — Discharge Instructions (Signed)
As discussed, please be sure to follow-up with your gynecologist and your primary care physician.  There are additional studies being performed and you will be made aware of abnormal findings.  Take all medication as directed.  Return here for concerning changes in your condition.

## 2015-04-19 NOTE — ED Provider Notes (Signed)
CSN: [id number]     Arrival date & time 04/19/15  0831 History   First MD Initiated Contact with Patient 04/19/15 [phone]     Chief Complaint  Patient presents with  . Pruritis  . Abdominal Pain     (Consider location/radiation/quality/duration/timing/severity/associated sxs/prior Treatment) HPI Patient presents with concern of diffuse pruritus, as well as vaginal discomfort, discharge. Diffuse symptoms have been present for about 2 weeks, according without precipitant, including no known change in detergent, medication, diet, living situation. There is been no diffuse rash occurring with the pruritus. No fever, chills, chest pain, dyspnea. Some areas of excoriation, though no areas of drainage. Over last 2 or 3 days the patient has also developed bilateral lower abdominal discomfort, intermittently. No dysuria, hematuria, though there is vaginal discharge and itchiness.  Past Medical History  Diagnosis Date  . Anemia   . Fibroids   . Thyroid goiter   . Knee joint injury   . Hypertension     no current meds for htn   Past Surgical History  Procedure Laterality Date  . Ankle surgery Right 2009    broken ankle   . Tubal ligation    . Replacement total knee Right 2012  . Arthroscopic surgery to knee Bilateral   . 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 Hypertension Mother   . Hypertension Father   . Cancer Maternal Grandmother     ? lung   History  Substance Use Topics  . Smoking status: Current Every Day Smoker -- 0.50 packs/day for 20 years    Types: Cigarettes  . Smokeless tobacco: Never Used  . Alcohol Use: 1.0 oz/week    2 drink(s) per week     Comment: occasionally once per month   OB History    Gravida Para Term Preterm AB TAB SAB Ectopic Multiple Living   4 2 2  2 1 1   2      Review of Systems  Constitutional:       Per HPI, otherwise negative  HENT:       Per HPI, otherwise negative  Respiratory:       Per HPI, otherwise negative  Cardiovascular:       Per HPI, otherwise negative  Gastrointestinal: Negative for vomiting.  Endocrine:       Negative aside from HPI  Genitourinary:       Neg aside from HPI   Musculoskeletal:       Per HPI, otherwise negative  Skin: Positive for wound. Negative for color change.  Neurological: Negative for syncope.      Allergies  Tramadol  Home Medications   Prior to Admission medications   Medication Sig Start Date End Date Taking? Authorizing Provider  HYDROcodone-acetaminophen (NORCO/VICODIN) 5-325 MG per tablet Take 1 tablet by mouth every 6 (six) hours as needed for moderate pain. Patient not taking: Reported on 04/19/2015 11/20/14   Evelina Bucy, MD  metroNIDAZOLE (FLAGYL) 500 MG tablet Take 1 tablet (500 mg total) by mouth 2 (two) times daily. Patient not taking: Reported on 04/19/2015 06/28/14   Luvenia Redden, PA-C   BP 146/89 mmHg  Pulse 77  Temp(Src) 98.2 F (36.8 C) (Oral)  Resp 13  SpO2 100%  LMP 03/27/2015 Physical Exam  Constitutional: She is oriented to person, place, and time.  She appears well-developed and well-nourished. No distress.  HENT:  Head: Normocephalic and atraumatic.  Eyes: Conjunctivae and EOM are normal.  Cardiovascular: Normal rate and regular rhythm.   Pulmonary/Chest: Effort normal and breath sounds normal. No stridor. No respiratory distress.  Abdominal: She exhibits no distension. There is no hepatosplenomegaly. There is no guarding.  Genitourinary:     Musculoskeletal: She exhibits no edema.  Neurological: She is alert and oriented to person, place, and time. No cranial nerve deficit.  Skin: Skin is warm and dry.  Psychiatric: She has a normal mood and affect.  Nursing note and vitals reviewed.   ED Course  Procedures (including critical care time) Labs  Review Labs Reviewed  WET PREP, GENITAL - Abnormal; Notable for the following:    Clue Cells Wet Prep HPF POC FEW (*)    WBC, Wet Prep HPF POC FEW (*)    All other components within normal limits  URINALYSIS, ROUTINE W REFLEX MICROSCOPIC - Abnormal; Notable for the following:    APPearance CLOUDY (*)    All other components within normal limits  POC URINE PREG, ED  GC/CHLAMYDIA PROBE AMP ([place])    I reviewed the results (including imaging as performed), agree with the interpretation  On repeat exam the patient appears better.  We reviewed all findings.   MDM   Final diagnoses:  Pruritus  Bacterial vaginosis   well-appearing female presents with ongoing vaginal discomfort, generalized itching. No evidence for peritonitis, sepsis, bacteremia. Patient has no abdominal tenderness on exam. Patient does have discharge, and was treated for bacterial vaginosis, as well as her itching. Without evidence for systemic pathology, she was appropriate for discharge with further evaluation to occur as an outpatient.  Carmin Muskrat, MD 04/19/15 [phone]

## 2015-04-20 LAB — GC/CHLAMYDIA PROBE AMP (~~LOC~~) NOT AT ARMC
CHLAMYDIA, DNA PROBE: NEGATIVE
NEISSERIA GONORRHEA: NEGATIVE

## 2015-05-01 ENCOUNTER — Emergency Department (HOSPITAL_COMMUNITY): Payer: Medicare Other

## 2015-05-01 ENCOUNTER — Emergency Department (HOSPITAL_COMMUNITY)
Admission: EM | Admit: 2015-05-01 | Discharge: 2015-05-01 | Disposition: A | Discharge status: Home / Self Care | Payer: Medicare Other | Attending: Emergency Medicine | Admitting: Emergency Medicine

## 2015-05-01 ENCOUNTER — Encounter (HOSPITAL_COMMUNITY): Payer: Self-pay

## 2015-05-01 DIAGNOSIS — R102 Pelvic and perineal pain: Secondary | ICD-10-CM

## 2015-05-01 DIAGNOSIS — N390 Urinary tract infection, site not specified: Secondary | ICD-10-CM | POA: Insufficient documentation

## 2015-05-01 DIAGNOSIS — Z79899 Other long term (current) drug therapy: Secondary | ICD-10-CM | POA: Diagnosis not present

## 2015-05-01 DIAGNOSIS — N760 Acute vaginitis: Secondary | ICD-10-CM | POA: Insufficient documentation

## 2015-05-01 DIAGNOSIS — N898 Other specified noninflammatory disorders of vagina: Secondary | ICD-10-CM | POA: Diagnosis present

## 2015-05-01 DIAGNOSIS — Z862 Personal history of diseases of the blood and blood-forming organs and certain disorders involving the immune mechanism: Secondary | ICD-10-CM | POA: Insufficient documentation

## 2015-05-01 DIAGNOSIS — I10 Essential (primary) hypertension: Secondary | ICD-10-CM | POA: Insufficient documentation

## 2015-05-01 DIAGNOSIS — Z720 Tobacco use: Secondary | ICD-10-CM | POA: Diagnosis not present

## 2015-05-01 DIAGNOSIS — Z8639 Personal history of other endocrine, nutritional and metabolic disease: Secondary | ICD-10-CM | POA: Diagnosis not present

## 2015-05-01 DIAGNOSIS — Z9851 Tubal ligation status: Secondary | ICD-10-CM | POA: Diagnosis not present

## 2015-05-01 DIAGNOSIS — N858 Other specified noninflammatory disorders of uterus: Secondary | ICD-10-CM | POA: Diagnosis not present

## 2015-05-01 DIAGNOSIS — B9689 Other specified bacterial agents as the cause of diseases classified elsewhere: Secondary | ICD-10-CM | POA: Diagnosis not present

## 2015-05-01 LAB — CBC WITH DIFFERENTIAL/PLATELET
Basophils Absolute: 0 10*3/uL (ref 0.0–0.1)
Basophils Relative: 0 % (ref 0–1)
Eosinophils Absolute: 0.2 10*3/uL (ref 0.0–0.7)
Eosinophils Relative: 3 % (ref 0–5)
HEMATOCRIT: 30.8 % — AB (ref 36.0–46.0)
HEMOGLOBIN: 9.4 g/dL — AB (ref 12.0–15.0)
Lymphocytes Relative: 23 % (ref 12–46)
Lymphs Abs: 1.4 10*3/uL (ref 0.7–4.0)
MCH: 23.9 pg — ABNORMAL LOW (ref 26.0–34.0)
MCHC: 30.5 g/dL (ref 30.0–36.0)
MCV: 78.2 fL (ref 78.0–100.0)
MONOS PCT: 9 % (ref 3–12)
Monocytes Absolute: 0.6 10*3/uL (ref 0.1–1.0)
NEUTROS ABS: 4 10*3/uL (ref 1.7–7.7)
Neutrophils Relative %: 65 % (ref 43–77)
Platelets: 293 10*3/uL (ref 150–400)
RBC: 3.94 MIL/uL (ref 3.87–5.11)
RDW: 20.2 % — ABNORMAL HIGH (ref 11.5–15.5)
WBC: 6.2 10*3/uL (ref 4.0–10.5)

## 2015-05-01 LAB — BASIC METABOLIC PANEL
Anion gap: 5 (ref 5–15)
BUN: 16 mg/dL (ref 6–20)
CO2: 22 mmol/L (ref 22–32)
Calcium: 9 mg/dL (ref 8.9–10.3)
Chloride: 111 mmol/L (ref 101–111)
Creatinine, Ser: 0.89 mg/dL (ref 0.44–1.00)
GFR calc Af Amer: >60 mL/min (ref >60)
GFR calc non Af Amer: >60 mL/min (ref >60)
Glucose, Bld: 94 mg/dL (ref 70–99)
POTASSIUM: 4.2 mmol/L (ref 3.5–5.1)
Sodium: 138 mmol/L (ref 135–145)

## 2015-05-01 LAB — URINALYSIS, ROUTINE W REFLEX MICROSCOPIC
Bilirubin Urine: NEGATIVE
GLUCOSE, UA: NEGATIVE mg/dL
Hgb urine dipstick: NEGATIVE
KETONES UR: NEGATIVE mg/dL
Nitrite: NEGATIVE
Protein, ur: NEGATIVE mg/dL
Specific Gravity, Urine: 1.027 (ref 1.005–1.030)
Urobilinogen, UA: 0.2 mg/dL (ref 0.0–1.0)
pH: 5.5 (ref 5.0–8.0)

## 2015-05-01 LAB — URINE MICROSCOPIC-ADD ON

## 2015-05-01 LAB — WET PREP, GENITAL
Clue Cells Wet Prep HPF POC: NONE SEEN
TRICH WET PREP: NONE SEEN

## 2015-05-01 LAB — POC URINE PREG, ED: PREG TEST UR: NEGATIVE

## 2015-05-01 MED ORDER — ONDANSETRON HCL 4 MG PO TABS: 4.0000 mg | ORAL_TABLET | Freq: Four times a day (QID) | ORAL | Status: DC

## 2015-05-01 MED ORDER — FLUCONAZOLE 200 MG PO TABS: 200.0000 mg | ORAL_TABLET | Freq: Every day | ORAL | Status: AC

## 2015-05-01 MED ORDER — AZITHROMYCIN 250 MG PO TABS
1000.0000 mg | ORAL_TABLET | Freq: Once | ORAL | Status: AC
  Administered 2015-05-01: 1000 mg via ORAL
  Filled 2015-05-01: qty 4

## 2015-05-01 MED ORDER — CEPHALEXIN 500 MG PO CAPS: 500.0000 mg | ORAL_CAPSULE | Freq: Four times a day (QID) | ORAL | Status: DC

## 2015-05-01 MED ORDER — HYDROCODONE-ACETAMINOPHEN 5-325 MG PO TABS: 2.0000 | ORAL_TABLET | ORAL | Status: DC | PRN

## 2015-05-01 MED ORDER — STERILE WATER FOR INJECTION IJ SOLN
INTRAMUSCULAR | Status: AC
  Administered 2015-05-01: 0.9 mL
  Filled 2015-05-01: qty 10

## 2015-05-01 MED ORDER — ONDANSETRON 4 MG PO TBDP
4.0000 mg | ORAL_TABLET | Freq: Once | ORAL | Status: AC
  Administered 2015-05-01: 4 mg via ORAL
  Filled 2015-05-01: qty 1

## 2015-05-01 MED ORDER — OXYCODONE-ACETAMINOPHEN 5-325 MG PO TABS
1.0000 | ORAL_TABLET | Freq: Once | ORAL | Status: AC
  Administered 2015-05-01: 1 via ORAL
  Filled 2015-05-01: qty 1

## 2015-05-01 MED ORDER — CEFTRIAXONE SODIUM 250 MG IJ SOLR
250.0000 mg | Freq: Once | INTRAMUSCULAR | Status: AC
  Administered 2015-05-01: 250 mg via INTRAMUSCULAR
  Filled 2015-05-01: qty 250

## 2015-05-01 NOTE — ED Notes (Signed)
Pt returned from [place]; awaiting results.

## 2015-05-01 NOTE — ED Notes (Signed)
Pt states dx with bacterial vaginitis last week. Given antibiotics.  Pt has completed script but discharge is getting worse.  Pain continues

## 2015-05-01 NOTE — ED Provider Notes (Signed)
CSN: [id number]     Arrival date & time 05/01/15  1056 History   First MD Initiated Contact with Patient 05/01/15 1119     Chief Complaint  Patient presents with  . Vaginal Discharge     (Consider location/radiation/quality/duration/timing/severity/associated sxs/prior Treatment) HPI    PCP: Nyoka Cowden, MD Blood pressure 151/82, pulse 77, temperature 98 F (36.7 C), temperature source Oral, resp. rate 18, last menstrual period 03/27/2015, SpO2 98 %.  Clare MERIEM LEMIEUX is a 49 y.o.female with a significant PMH of anemia, fibroids, hypertension presents to the ER with complaints of vaginal discharge, and suprapubic abdominal pains. She was seen on 4/28 for similar symptoms, given antibiotics and discharged. Today she reports significantly more discharge, it is now yellow. She is also having some dysuria. No hematuria. No nausea, vomiting, diarrhea, fevers, or flank pain. Pt also endorses itching.   Past Medical History  Diagnosis Date  . Anemia   . Fibroids   . Thyroid goiter   . Knee joint injury   . Hypertension     no current meds for htn   Past Surgical History  Procedure Laterality Date  . Ankle surgery Right 2009    broken ankle   . Tubal ligation    . Replacement total knee Right 2012  . Arthroscopic surgery to knee Bilateral   . 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 Hypertension Mother   . Hypertension Father   . Cancer Maternal Grandmother     ? lung   History  Substance Use Topics  . Smoking status: Current Every Day Smoker -- 0.50 packs/day for 20 years    Types: Cigarettes  . Smokeless tobacco: Never Used  . Alcohol Use: 1.0 oz/week    2 drink(s) per week     Comment: occasionally once per month   OB History    Gravida Para Term Preterm AB TAB SAB Ectopic  Multiple Living   4 2 2  2 1 1   2      Review of Systems  10 Systems reviewed and are negative for acute change except as noted in the HPI.    Allergies  Tramadol  Home Medications   Prior to Admission medications   Medication Sig Start Date End Date Taking? Authorizing Provider  cephALEXin (KEFLEX) 500 MG capsule Take 1 capsule (500 mg total) by mouth 4 (four) times daily. 05/01/15   Cadence Minton Carlota Raspberry, PA-C  fluconazole (DIFLUCAN) 200 MG tablet Take 1 tablet (200 mg total) by mouth daily. 05/01/15 05/08/15  Delos Haring, PA-C  HYDROcodone-acetaminophen (NORCO/VICODIN) 5-325 MG per tablet Take 2 tablets by mouth every 4 (four) hours as needed. 05/01/15   Conchita Truxillo Carlota Raspberry, PA-C  hydrOXYzine (ATARAX/VISTARIL) 25 MG tablet Take 1 tablet (25 mg total) by mouth every 8 (eight) hours as needed for itching. Patient not taking: Reported on 05/01/2015 04/19/15   Carmin Muskrat, MD  metroNIDAZOLE (FLAGYL) 500 MG tablet Take 1 tablet (500 mg total) by mouth 2 (two) times daily. Patient not taking: Reported on 05/01/2015 04/19/15   Carmin Muskrat, MD  ondansetron (ZOFRAN) 4 MG tablet Take 1 tablet (4 mg total) by mouth every 6 (six) hours. 05/01/15   Verna Hamon Carlota Raspberry, PA-C   BP 115/65 mmHg  Pulse 85  Temp(Src) 98.5 F (36.9 C) (Oral)  Resp 18  SpO2 99%  LMP 03/27/2015 Physical Exam  Constitutional: She appears well-developed and well-nourished. No distress.  HENT:  Head: Normocephalic and atraumatic.  Eyes: Pupils are equal, round, and reactive to light.  Neck: Normal range of motion. Neck supple.  Cardiovascular: Normal rate and regular rhythm.   Pulmonary/Chest: Effort normal.  Abdominal: Soft. Bowel sounds are normal. There is tenderness in the suprapubic area. There is no rigidity, no rebound, no guarding and no CVA tenderness.  Genitourinary: Uterus normal. Cervix exhibits discharge (yellow and thick discharge) and friability. Cervix exhibits no motion tenderness. Right adnexum displays  tenderness. Left adnexum displays tenderness. There is tenderness in the vagina. Vaginal discharge found.  Neurological: She is alert.  Skin: Skin is warm and dry.  Nursing note and vitals reviewed.   ED Course  Procedures (including critical care time) Labs Review Labs Reviewed  WET PREP, GENITAL - Abnormal; Notable for the following:    Yeast Wet Prep HPF POC FEW (*)    WBC, Wet Prep HPF POC MANY (*)    All other components within normal limits  URINALYSIS, ROUTINE W REFLEX MICROSCOPIC - Abnormal; Notable for the following:    APPearance CLOUDY (*)    Leukocytes, UA MODERATE (*)    All other components within normal limits  URINE MICROSCOPIC-ADD ON - Abnormal; Notable for the following:    Bacteria, UA FEW (*)    All other components within normal limits  CBC WITH DIFFERENTIAL/PLATELET - Abnormal; Notable for the following:    Hemoglobin 9.4 (*)    HCT 30.8 (*)    MCH 23.9 (*)    RDW 20.2 (*)    All other components within normal limits  URINE CULTURE  BASIC METABOLIC PANEL  POC URINE PREG, ED  GC/CHLAMYDIA PROBE AMP ([place])    Imaging Review US Transvaginal Non-ob  05/01/2015   CLINICAL DATA:  Right adnexal tenderness for 2 weeks  EXAM: TRANSABDOMINAL AND TRANSVAGINAL ULTRASOUND OF PELVIS  DOPPLER ULTRASOUND OF OVARIES  TECHNIQUE: Both transabdominal and transvaginal ultrasound examinations of the pelvis were performed. Transabdominal technique was performed for global imaging of the pelvis including uterus, ovaries, adnexal regions, and pelvic cul-de-sac.  It was necessary to proceed with endovaginal exam following the transabdominal exam to visualize the ovaries and endometrium. Color and duplex Doppler ultrasound was utilized to evaluate blood flow to the ovaries.  COMPARISON:  07/23/2013  FINDINGS: Uterus  Measurements: 8 x 5 x 6 cm. No mass is seen. Previously suspected pedunculated fibroid was not visualized.  Endometrium  Thickness: 4 mm.  No focal abnormality  visualized.  Right ovary  Measurements: 2.8 x 1.5 x 2.7 cm. Normal appearance/no adnexal mass.  Left ovary  Measurements: 2.3 x 1.7 x 1.7 cm. An 18 mm simple appearing cystic structure is likely a follicle given patient's age.  Pulsed Doppler evaluation of both ovaries demonstrates normal low-resistance arterial and venous waveforms.  Other findings  Small loculated fluid around the left ovary, unchanged from prior with morphology suggesting a peritoneal inclusion cyst.  IMPRESSION: No ovarian torsion or other explanation for pelvic pain.   Electronically Signed   By: Monte Fantasia M.D.   On: 05/01/2015 14:07   US Pelvis Complete  05/01/2015   CLINICAL DATA:  Right adnexal tenderness for 2 weeks  EXAM: TRANSABDOMINAL AND TRANSVAGINAL ULTRASOUND OF PELVIS  DOPPLER ULTRASOUND OF OVARIES  TECHNIQUE: Both transabdominal and transvaginal ultrasound examinations of the pelvis were performed. Transabdominal technique was performed for global imaging of the pelvis including  uterus, ovaries, adnexal regions, and pelvic cul-de-sac.  It was necessary to proceed with endovaginal exam following the transabdominal exam to visualize the ovaries and endometrium. Color and duplex Doppler ultrasound was utilized to evaluate blood flow to the ovaries.  COMPARISON:  07/23/2013  FINDINGS: Uterus  Measurements: 8 x 5 x 6 cm. No mass is seen. Previously suspected pedunculated fibroid was not visualized.  Endometrium  Thickness: 4 mm.  No focal abnormality visualized.  Right ovary  Measurements: 2.8 x 1.5 x 2.7 cm. Normal appearance/no adnexal mass.  Left ovary  Measurements: 2.3 x 1.7 x 1.7 cm. An 18 mm simple appearing cystic structure is likely a follicle given patient's age.  Pulsed Doppler evaluation of both ovaries demonstrates normal low-resistance arterial and venous waveforms.  Other findings  Small loculated fluid around the left ovary, unchanged from prior with morphology suggesting a peritoneal inclusion cyst.  IMPRESSION:  No ovarian torsion or other explanation for pelvic pain.   Electronically Signed   By: Monte Fantasia M.D.   On: 05/01/2015 14:07   [place] Art/ven Flow Abd Pelv Doppler  05/01/2015   CLINICAL DATA:  Right adnexal tenderness for 2 weeks  EXAM: TRANSABDOMINAL AND TRANSVAGINAL ULTRASOUND OF PELVIS  DOPPLER ULTRASOUND OF OVARIES  TECHNIQUE: Both transabdominal and transvaginal ultrasound examinations of the pelvis were performed. Transabdominal technique was performed for global imaging of the pelvis including uterus, ovaries, adnexal regions, and pelvic cul-de-sac.  It was necessary to proceed with endovaginal exam following the transabdominal exam to visualize the ovaries and endometrium. Color and duplex Doppler ultrasound was utilized to evaluate blood flow to the ovaries.  COMPARISON:  07/23/2013  FINDINGS: Uterus  Measurements: 8 x 5 x 6 cm. No mass is seen. Previously suspected pedunculated fibroid was not visualized.  Endometrium  Thickness: 4 mm.  No focal abnormality visualized.  Right ovary  Measurements: 2.8 x 1.5 x 2.7 cm. Normal appearance/no adnexal mass.  Left ovary  Measurements: 2.3 x 1.7 x 1.7 cm. An 18 mm simple appearing cystic structure is likely a follicle given patient's age.  Pulsed Doppler evaluation of both ovaries demonstrates normal low-resistance arterial and venous waveforms.  Other findings  Small loculated fluid around the left ovary, unchanged from prior with morphology suggesting a peritoneal inclusion cyst.  IMPRESSION: No ovarian torsion or other explanation for pelvic pain.   Electronically Signed   By: Monte Fantasia M.D.   On: 05/01/2015 14:07     EKG Interpretation None      MDM   Final diagnoses:  Adnexal tenderness, right  UTI (lower urinary tract infection)  Vaginitis    Patients Ultrasound shows no acute findings that require intervention or further work-up. Her Urinalysis shows moderate leucocytes with few bacteria - WBC 3-6. Wet prep shows MANY wbc and  few yeast. BMP and CBC are otherwise at patients baseline.  Medications  cefTRIAXone (ROCEPHIN) injection 250 mg (250 mg Intramuscular Given 05/01/15 1317)  azithromycin (ZITHROMAX) tablet 1,000 mg (1,000 mg Oral Given 05/01/15 1317)  ondansetron (ZOFRAN-ODT) disintegrating tablet 4 mg (4 mg Oral Given 05/01/15 1317)  oxyCODONE-acetaminophen (PERCOCET/ROXICET) 5-325 MG per tablet 1 tablet (1 tablet Oral Given 05/01/15 1317)  sterile water (preservative free) injection (0.9 mLs  Given 05/01/15 1317)    WBC treated with Rocephin and Azithromycin. Rx, pain and nausea medications, Diflucan (few yeast) and Keflex (poss. UTI)    49 y.o.Rudene Anda Marohl's evaluation in the Emergency Department is complete. It has been determined that no acute conditions requiring further  emergency intervention are present at this time. The patient/guardian have been advised of the diagnosis and plan. We have discussed signs and symptoms that warrant return to the ED, such as changes or worsening in symptoms.  Vital signs are stable at discharge. Filed Vitals:   05/01/15 1450  BP: 115/65  Pulse: 85  Temp: 98.5 F (36.9 C)  Resp: 18    Patient/guardian has voiced understanding and agreed to follow-up with the PCP or specialist.    Delos Haring, PA-C 05/01/15 [place], DO 05/01/15 1519

## 2015-05-01 NOTE — ED Notes (Signed)
Pt transported to US

## 2015-05-01 NOTE — ED Notes (Signed)
Pt reports thick yellow vaginal discharge, dysuria, and lower abdominal pain. Pt reports abstinence since diagnosed on 4/28.

## 2015-05-01 NOTE — Discharge Instructions (Signed)
Pelvic Pain Female pelvic pain can be caused by many different things and start from a variety of places. Pelvic pain refers to pain that is located in the lower half of the abdomen and between your hips. The pain may occur over a short period of time (acute) or may be reoccurring (chronic). The cause of pelvic pain may be related to disorders affecting the female reproductive organs (gynecologic), but it may also be related to the bladder, kidney stones, an intestinal complication, or muscle or skeletal problems. Getting help right away for pelvic pain is important, especially if there has been severe, sharp, or a sudden onset of unusual pain. It is also important to get help right away because some types of pelvic pain can be life threatening.  CAUSES  Below are only some of the causes of pelvic pain. The causes of pelvic pain can be in one of several categories.   Gynecologic.  Pelvic inflammatory disease.  Sexually transmitted infection.  Ovarian cyst or a twisted ovarian ligament (ovarian torsion).  Uterine lining that grows outside the uterus (endometriosis).  Fibroids, cysts, or tumors.  Ovulation.  Pregnancy.  Pregnancy that occurs outside the uterus (ectopic pregnancy).  Miscarriage.  Labor.  Abruption of the placenta or ruptured uterus.  Infection.  Uterine infection (endometritis).  Bladder infection.  Diverticulitis.  Miscarriage related to a uterine infection (septic abortion).  Bladder.  Inflammation of the bladder (cystitis).  Kidney stone(s).  Gastrointestinal.  Constipation.  Diverticulitis.  Neurologic.  Trauma.  Feeling pelvic pain because of mental or emotional causes (psychosomatic).  Cancers of the bowel or pelvis. EVALUATION  Your caregiver will want to take a careful history of your concerns. This includes recent changes in your health, a careful gynecologic history of your periods (menses), and a sexual history. Obtaining your family  history and medical history is also important. Your caregiver may suggest a pelvic exam. A pelvic exam will help identify the location and severity of the pain. It also helps in the evaluation of which organ system may be involved. In order to identify the cause of the pelvic pain and be properly treated, your caregiver may order tests. These tests may include:   A pregnancy test.  Pelvic ultrasonography.  An X-ray exam of the abdomen.  A urinalysis or evaluation of vaginal discharge.  Blood tests. HOME CARE INSTRUCTIONS   Only take over-the-counter or prescription medicines for pain, discomfort, or fever as directed by your caregiver.   Rest as directed by your caregiver.   Eat a balanced diet.   Drink enough fluids to make your urine clear or pale yellow, or as directed.   Avoid sexual intercourse if it causes pain.   Apply warm or cold compresses to the lower abdomen depending on which one helps the pain.   Avoid stressful situations.   Keep a journal of your pelvic pain. Write down when it started, where the pain is located, and if there are things that seem to be associated with the pain, such as food or your menstrual cycle.  Follow up with your caregiver as directed.  SEEK MEDICAL CARE IF:  Your medicine does not help your pain.  You have abnormal vaginal discharge. SEEK IMMEDIATE MEDICAL CARE IF:   You have heavy bleeding from the vagina.   Your pelvic pain increases.   You feel light-headed or faint.   You have chills.   You have pain with urination or blood in your urine.   You have uncontrolled diarrhea   or vomiting.   You have a fever or persistent symptoms for more than 3 days.  You have a fever and your symptoms suddenly get worse.   You are being physically or sexually abused.  MAKE SURE YOU:  Understand these instructions.  Will watch your condition.  Will get help if you are not doing well or get worse. Document Released:  11/04/2004 Document Revised: 04/24/2014 Document Reviewed: 03/29/2012 Exit[employer] Patient Information 2015 [employer]. This information is not intended to replace advice given to you by your health care provider. Make sure you discuss any questions you have with your health care provider.  

## 2015-05-02 LAB — GC/CHLAMYDIA PROBE AMP (~~LOC~~) NOT AT ARMC
CHLAMYDIA, DNA PROBE: NEGATIVE
Neisseria Gonorrhea: NEGATIVE

## 2015-05-03 LAB — URINE CULTURE
COLONY COUNT: NO GROWTH
CULTURE: NO GROWTH
Special Requests: NORMAL

## 2015-05-18 DIAGNOSIS — M79604 Pain in right leg: Secondary | ICD-10-CM | POA: Diagnosis not present

## 2015-05-25 ENCOUNTER — Other Ambulatory Visit: Payer: Self-pay | Admitting: Otolaryngology

## 2015-05-25 DIAGNOSIS — E049 Nontoxic goiter, unspecified: Secondary | ICD-10-CM

## 2015-05-25 DIAGNOSIS — D497 Neoplasm of unspecified behavior of endocrine glands and other parts of nervous system: Secondary | ICD-10-CM

## 2015-05-28 DIAGNOSIS — M79604 Pain in right leg: Secondary | ICD-10-CM | POA: Diagnosis not present

## 2015-05-30 ENCOUNTER — Telehealth: Payer: Self-pay | Admitting: Internal Medicine

## 2015-05-30 ENCOUNTER — Other Ambulatory Visit (HOSPITAL_COMMUNITY): Payer: Self-pay | Admitting: Orthopedic Surgery

## 2015-05-30 ENCOUNTER — Inpatient Hospital Stay (HOSPITAL_COMMUNITY): Admission: RE | Admit: 2015-05-30 | Payer: Medicare Other | Source: Ambulatory Visit

## 2015-05-30 ENCOUNTER — Ambulatory Visit (HOSPITAL_COMMUNITY)
Admission: RE | Admit: 2015-05-30 | Discharge: 2015-05-30 | Disposition: A | Discharge status: Home / Self Care | Payer: Medicare Other | Source: Ambulatory Visit | Attending: Orthopedic Surgery | Admitting: Orthopedic Surgery

## 2015-05-30 DIAGNOSIS — I8391 Asymptomatic varicose veins of right lower extremity: Secondary | ICD-10-CM | POA: Diagnosis not present

## 2015-05-30 DIAGNOSIS — M79604 Pain in right leg: Secondary | ICD-10-CM | POA: Insufficient documentation

## 2015-05-30 NOTE — Telephone Encounter (Signed)
See message.

## 2015-05-30 NOTE — Progress Notes (Signed)
*  Preliminary Results* Right lower extremity venous duplex completed. Right lower extremity is negative for deep vein thrombosis. 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 There is no evidence of right Baker's cyst.  Preliminary results discussed with Beth of Dr. Charlestine Night office.  05/30/2015 2:03 PM  Maudry Mayhew, RVT, RDCS, RDMS

## 2015-05-30 NOTE — Telephone Encounter (Signed)
[name] w/ Dr Georgie Chard office called: Pt had ultra sound ; it was positive for thrombosis and varicose veins of lateral mid right calf.  They will initiate treatment 7 days of Lovenox inj Pt needs to fu w/ dr Raliegh Ip for long term treatment after that

## 2015-06-04 DIAGNOSIS — M79604 Pain in right leg: Secondary | ICD-10-CM | POA: Diagnosis not present

## 2015-07-07 ENCOUNTER — Emergency Department (HOSPITAL_COMMUNITY)
Admission: EM | Admit: 2015-07-07 | Discharge: 2015-07-07 | Disposition: A | Discharge status: Home / Self Care | Payer: Medicare Other | Attending: Emergency Medicine | Admitting: Emergency Medicine

## 2015-07-07 ENCOUNTER — Encounter (HOSPITAL_COMMUNITY): Payer: Self-pay | Admitting: Emergency Medicine

## 2015-07-07 DIAGNOSIS — L03116 Cellulitis of left lower limb: Secondary | ICD-10-CM | POA: Insufficient documentation

## 2015-07-07 DIAGNOSIS — Y9389 Activity, other specified: Secondary | ICD-10-CM | POA: Insufficient documentation

## 2015-07-07 DIAGNOSIS — S80862A Insect bite (nonvenomous), left lower leg, initial encounter: Secondary | ICD-10-CM | POA: Diagnosis present

## 2015-07-07 DIAGNOSIS — Z87828 Personal history of other (healed) physical injury and trauma: Secondary | ICD-10-CM | POA: Insufficient documentation

## 2015-07-07 DIAGNOSIS — Z792 Long term (current) use of antibiotics: Secondary | ICD-10-CM | POA: Insufficient documentation

## 2015-07-07 DIAGNOSIS — Z862 Personal history of diseases of the blood and blood-forming organs and certain disorders involving the immune mechanism: Secondary | ICD-10-CM | POA: Diagnosis not present

## 2015-07-07 DIAGNOSIS — I10 Essential (primary) hypertension: Secondary | ICD-10-CM | POA: Insufficient documentation

## 2015-07-07 DIAGNOSIS — Y999 Unspecified external cause status: Secondary | ICD-10-CM | POA: Diagnosis not present

## 2015-07-07 DIAGNOSIS — Z8742 Personal history of other diseases of the female genital tract: Secondary | ICD-10-CM | POA: Insufficient documentation

## 2015-07-07 DIAGNOSIS — Z79899 Other long term (current) drug therapy: Secondary | ICD-10-CM | POA: Diagnosis not present

## 2015-07-07 DIAGNOSIS — Z720 Tobacco use: Secondary | ICD-10-CM | POA: Insufficient documentation

## 2015-07-07 DIAGNOSIS — Y92007 Garden or yard of unspecified non-institutional (private) residence as the place of occurrence of the external cause: Secondary | ICD-10-CM | POA: Diagnosis not present

## 2015-07-07 DIAGNOSIS — W57XXXA Bitten or stung by nonvenomous insect and other nonvenomous arthropods, initial encounter: Secondary | ICD-10-CM | POA: Insufficient documentation

## 2015-07-07 MED ORDER — HYDROCODONE-ACETAMINOPHEN 5-325 MG PO TABS: 1.0000 | ORAL_TABLET | Freq: Four times a day (QID) | ORAL | Status: DC | PRN

## 2015-07-07 MED ORDER — CEPHALEXIN 500 MG PO CAPS: 500.0000 mg | ORAL_CAPSULE | Freq: Four times a day (QID) | ORAL | Status: DC

## 2015-07-07 MED ORDER — SULFAMETHOXAZOLE-TRIMETHOPRIM 800-160 MG PO TABS: 1.0000 | ORAL_TABLET | Freq: Two times a day (BID) | ORAL | Status: AC

## 2015-07-07 NOTE — ED Notes (Addendum)
Pt complaint of unknown insect bite to left leg Wednesday while doing yard work; current complaint of worsening pain and redness to site.

## 2015-07-07 NOTE — ED Notes (Signed)
Rees at bedside. 

## 2015-07-07 NOTE — ED Provider Notes (Signed)
CSN: [id number]     Arrival date & time 07/07/15  0944 History   First MD Initiated Contact with Patient 07/07/15 1005     Chief Complaint  Patient presents with  . Insect Bite    The history is provided by the patient. No language interpreter was used.   Ms. Norfolk presents for evaluation of insect bite. 3 days ago she was working in the garden and she felt something sting her left lower leg. She expressed immediate pain and mild swelling. She's had progressive pain, swelling, redness in the left lower extremity. She denies any fevers or injuries. Symptoms are moderate, constant, worsening. She denies any history of diabetes.  Past Medical History  Diagnosis Date  . Anemia   . Fibroids   . Thyroid goiter   . Knee joint injury   . Hypertension     no current meds for htn   Past Surgical History  Procedure Laterality Date  . Ankle surgery Right 2009    broken ankle   . Tubal ligation    . Replacement total knee Right 2012  . Arthroscopic surgery to knee Bilateral   . 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 Hypertension Mother   . Hypertension Father   . Cancer Maternal Grandmother     ? lung   History  Substance Use Topics  . Smoking status: Current Every Day Smoker -- 0.50 packs/day for 20 years    Types: Cigarettes  . Smokeless tobacco: Never Used  . Alcohol Use: 1.0 oz/week    2 drink(s) per week     Comment: occasionally once per month   OB History    Gravida Para Term Preterm AB TAB SAB Ectopic Multiple Living   4 2 2  2 1 1   2      Review of Systems  All other systems reviewed and are negative.     Allergies  Tramadol  Home Medications   Prior to Admission medications   Medication Sig Start Date End Date Taking? Authorizing Provider  cephALEXin (KEFLEX) 500 MG  capsule Take 1 capsule (500 mg total) by mouth 4 (four) times daily. 05/01/15   Delos Haring, PA-C  HYDROcodone-acetaminophen (NORCO/VICODIN) 5-325 MG per tablet Take 2 tablets by mouth every 4 (four) hours as needed. 05/01/15   Tiffany Carlota Raspberry, PA-C  hydrOXYzine (ATARAX/VISTARIL) 25 MG tablet Take 1 tablet (25 mg total) by mouth every 8 (eight) hours as needed for itching. Patient not taking: Reported on 05/01/2015 04/19/15   Carmin Muskrat, MD  metroNIDAZOLE (FLAGYL) 500 MG tablet Take 1 tablet (500 mg total) by mouth 2 (two) times daily. Patient not taking: Reported on 05/01/2015 04/19/15   Carmin Muskrat, MD  ondansetron (ZOFRAN) 4 MG tablet Take 1 tablet (4 mg total) by mouth every 6 (six) hours. 05/01/15   Tiffany Carlota Raspberry, PA-C   BP 149/86 mmHg  Pulse 78  Temp(Src) 98.2 F (36.8 C) (Oral)  Resp 18  Ht 5\' 6"  (1.676 m)  Wt 230 lb (104.327 kg)  BMI 37.14 kg/m2  SpO2 97%  LMP 07/01/2015 Physical Exam  Constitutional: She is oriented to person, place, and time. She appears well-developed and well-nourished.  HENT:  Head: Normocephalic and atraumatic.  Cardiovascular: Normal rate and regular rhythm.   No murmur heard. Pulmonary/Chest: Effort normal and  breath sounds normal. No respiratory distress.  Abdominal: Soft. There is no tenderness. There is no rebound and no guarding.  Musculoskeletal:  Moderate erythema and tenderness over the distal left lower extremity. There is a punctate lesion with approximately 4-5 cm of surrounding erythema. There is no purulent discharge or fluctuance, no evidence of abscess. There is mild diffuse edema of the ankle and calf. 2+ DP pulses bilaterally  Neurological: She is alert and oriented to person, place, and time.  Skin: Skin is warm and dry.  Psychiatric: She has a normal mood and affect. Her behavior is normal.  Nursing note and vitals reviewed.   ED Course  Procedures (including critical care time) Labs Review Labs Reviewed - No data to  display  Imaging Review No results found.   EKG Interpretation None      MDM   Final diagnoses:  Cellulitis of left lower extremity    Patient here for evaluation of left lower extremity pain, recent insect bite. Exam concerning for early cellulitis. No evidence of abscess. History was dictation is not consistent with DVT. Discussed home care and return precautions.    Quintella Reichert, MD 07/07/15 1034

## 2015-07-07 NOTE — Discharge Instructions (Signed)

## 2015-07-10 ENCOUNTER — Encounter (HOSPITAL_COMMUNITY): Payer: Self-pay | Admitting: Emergency Medicine

## 2015-10-03 ENCOUNTER — Encounter (HOSPITAL_COMMUNITY): Payer: Self-pay | Admitting: Emergency Medicine

## 2015-10-03 ENCOUNTER — Emergency Department (HOSPITAL_COMMUNITY): Payer: Medicare Other

## 2015-10-03 ENCOUNTER — Emergency Department (HOSPITAL_COMMUNITY)
Admission: EM | Admit: 2015-10-03 | Discharge: 2015-10-03 | Disposition: A | Discharge status: Home / Self Care | Payer: Medicare Other | Attending: Emergency Medicine | Admitting: Emergency Medicine

## 2015-10-03 DIAGNOSIS — N760 Acute vaginitis: Secondary | ICD-10-CM | POA: Insufficient documentation

## 2015-10-03 DIAGNOSIS — R300 Dysuria: Secondary | ICD-10-CM | POA: Insufficient documentation

## 2015-10-03 DIAGNOSIS — B9689 Other specified bacterial agents as the cause of diseases classified elsewhere: Secondary | ICD-10-CM

## 2015-10-03 DIAGNOSIS — I10 Essential (primary) hypertension: Secondary | ICD-10-CM | POA: Diagnosis not present

## 2015-10-03 DIAGNOSIS — R1030 Lower abdominal pain, unspecified: Secondary | ICD-10-CM | POA: Diagnosis present

## 2015-10-03 DIAGNOSIS — Z85850 Personal history of malignant neoplasm of thyroid: Secondary | ICD-10-CM | POA: Insufficient documentation

## 2015-10-03 DIAGNOSIS — Z720 Tobacco use: Secondary | ICD-10-CM | POA: Insufficient documentation

## 2015-10-03 DIAGNOSIS — Z862 Personal history of diseases of the blood and blood-forming organs and certain disorders involving the immune mechanism: Secondary | ICD-10-CM | POA: Diagnosis not present

## 2015-10-03 DIAGNOSIS — R0602 Shortness of breath: Secondary | ICD-10-CM | POA: Diagnosis not present

## 2015-10-03 DIAGNOSIS — N3000 Acute cystitis without hematuria: Secondary | ICD-10-CM | POA: Diagnosis not present

## 2015-10-03 DIAGNOSIS — Z792 Long term (current) use of antibiotics: Secondary | ICD-10-CM | POA: Diagnosis not present

## 2015-10-03 DIAGNOSIS — Z8739 Personal history of other diseases of the musculoskeletal system and connective tissue: Secondary | ICD-10-CM | POA: Diagnosis not present

## 2015-10-03 DIAGNOSIS — Z86018 Personal history of other benign neoplasm: Secondary | ICD-10-CM | POA: Insufficient documentation

## 2015-10-03 LAB — WET PREP, GENITAL
TRICH WET PREP: NONE SEEN
YEAST WET PREP: NONE SEEN

## 2015-10-03 LAB — COMPREHENSIVE METABOLIC PANEL
ALT: 13 U/L — ABNORMAL LOW (ref 14–54)
ANION GAP: 5 (ref 5–15)
AST: 19 U/L (ref 15–41)
Albumin: 4 g/dL (ref 3.5–5.0)
Alkaline Phosphatase: 56 U/L (ref 38–126)
BUN: 13 mg/dL (ref 6–20)
CHLORIDE: 108 mmol/L (ref 101–111)
CO2: 25 mmol/L (ref 22–32)
Calcium: 9.1 mg/dL (ref 8.9–10.3)
Creatinine, Ser: 0.87 mg/dL (ref 0.44–1.00)
Glucose, Bld: 93 mg/dL (ref 65–99)
POTASSIUM: 3.7 mmol/L (ref 3.5–5.1)
Sodium: 138 mmol/L (ref 135–145)
Total Bilirubin: 1.1 mg/dL (ref 0.3–1.2)
Total Protein: 7.4 g/dL (ref 6.5–8.1)

## 2015-10-03 LAB — CBC
HEMATOCRIT: 30.3 % — AB (ref 36.0–46.0)
Hemoglobin: 9.2 g/dL — ABNORMAL LOW (ref 12.0–15.0)
MCH: 24.6 pg — ABNORMAL LOW (ref 26.0–34.0)
MCHC: 30.4 g/dL (ref 30.0–36.0)
MCV: 81 fL (ref 78.0–100.0)
Platelets: 295 10*3/uL (ref 150–400)
RBC: 3.74 MIL/uL — AB (ref 3.87–5.11)
RDW: 21.2 % — ABNORMAL HIGH (ref 11.5–15.5)
WBC: 4.2 10*3/uL (ref 4.0–10.5)

## 2015-10-03 LAB — URINALYSIS, ROUTINE W REFLEX MICROSCOPIC
Glucose, UA: NEGATIVE mg/dL
Hgb urine dipstick: NEGATIVE
KETONES UR: NEGATIVE mg/dL
NITRITE: NEGATIVE
PH: 5.5 (ref 5.0–8.0)
Protein, ur: NEGATIVE mg/dL
SPECIFIC GRAVITY, URINE: 1.033 — AB (ref 1.005–1.030)
Urobilinogen, UA: 0.2 mg/dL (ref 0.0–1.0)

## 2015-10-03 LAB — URINE MICROSCOPIC-ADD ON

## 2015-10-03 LAB — LIPASE, BLOOD: LIPASE: 35 U/L (ref 22–51)

## 2015-10-03 MED ORDER — CEPHALEXIN 500 MG PO CAPS: 500.0000 mg | ORAL_CAPSULE | Freq: Three times a day (TID) | ORAL | Status: DC

## 2015-10-03 MED ORDER — METRONIDAZOLE 0.75 % VA GEL: 1.0000 | Freq: Two times a day (BID) | VAGINAL | Status: DC

## 2015-10-03 MED ORDER — HYDROCODONE-ACETAMINOPHEN 5-325 MG PO TABS: 1.0000 | ORAL_TABLET | Freq: Four times a day (QID) | ORAL | Status: DC | PRN

## 2015-10-03 NOTE — ED Notes (Signed)
Nurse about to start a IV and collect labs.

## 2015-10-03 NOTE — ED Notes (Signed)
Pt transported to XR.  

## 2015-10-03 NOTE — ED Provider Notes (Signed)
CSN: [id number]     Arrival date & time 10/03/15  1044 History   First MD Initiated Contact with Patient 10/03/15 1102     Chief Complaint  Patient presents with  . Abdominal Pain  . Dysuria     (Consider location/radiation/quality/duration/timing/severity/associated sxs/prior Treatment) HPI Comments: Having some lower abdominal pain for one week. Also having upper back pain after a percussion test at [facility] hospital. Pain is worse with breathing.  Patient is a 49 y.o. female presenting with abdominal pain and dysuria. The history is provided by the patient.  Abdominal Pain Pain location:  Suprapubic Pain quality: burning   Pain radiates to:  Does not radiate Pain severity:  Moderate Onset quality:  Gradual Duration:  1 week Timing: severeity waxes and wanes. Progression:  Unchanged Chronicity:  New Context: not sick contacts   Associated symptoms: dysuria and nausea   Associated symptoms: no chest pain, no cough, no fever, no shortness of breath, no vaginal bleeding, no vaginal discharge and no vomiting   Dysuria Associated symptoms: abdominal pain and nausea   Associated symptoms: no fever, no vaginal discharge and no vomiting     Past Medical History  Diagnosis Date  . Hypertension   . Anemia   . Fibroids   . Thyroid goiter   . Knee joint injury   . Hypertension     no current meds for htn   Past Surgical History  Procedure Laterality Date  . Cesarean section    . Replacement total knee    . Hand surgery    . Ankle surgery    . Ankle surgery Right 2009    broken ankle   . Tubal ligation    . Replacement total knee Right 2012  . Arthroscopic surgery to knee Bilateral   . 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 Hypertension Mother   . Hypertension Father   . Cancer  Maternal Grandmother     ? lung   Social History  Substance Use Topics  . Smoking status: Current Every Day Smoker -- 0.50 packs/day for 20 years    Types: Cigarettes  . Smokeless tobacco: Never Used  . Alcohol Use: 1.0 oz/week    2 drink(s) per week     Comment: occasionally once per month   OB History    Gravida Para Term Preterm AB TAB SAB Ectopic Multiple Living   7 4 4  0 3 1 1  0  2     Review of Systems  Constitutional: Negative for fever.  Respiratory: Negative for cough and shortness of breath.   Cardiovascular: Negative for chest pain.  Gastrointestinal: Positive for nausea and abdominal pain. Negative for vomiting.  Genitourinary: Positive for dysuria. Negative for vaginal bleeding and vaginal discharge.  All other systems reviewed and are negative.     Allergies  Tramadol and Tramadol  Home Medications   Prior to Admission medications   Medication Sig Start Date End Date Taking? Authorizing Provider  cephALEXin (KEFLEX) 500 MG capsule Take 1 capsule (500 mg total) by mouth 4 (four) times daily. 07/07/15   Quintella Reichert, MD  HYDROcodone-acetaminophen (NORCO/VICODIN) 5-325 MG per tablet Take 1 tablet by mouth every 6 (six) hours as needed. 07/07/15   Quintella Reichert, MD  hydrOXYzine (ATARAX/VISTARIL) 25 MG tablet Take 1 tablet (25 mg  total) by mouth every 8 (eight) hours as needed for itching. Patient not taking: Reported on 05/01/2015 04/19/15   Carmin Muskrat, MD  metroNIDAZOLE (FLAGYL) 500 MG tablet Take 1 tablet (500 mg total) by mouth 2 (two) times daily. 02/07/15   Lori A Clemmons, CNM  metroNIDAZOLE (FLAGYL) 500 MG tablet Take 1 tablet (500 mg total) by mouth 2 (two) times daily. Patient not taking: Reported on 05/01/2015 04/19/15   Carmin Muskrat, MD  ondansetron (ZOFRAN) 4 MG tablet Take 1 tablet (4 mg total) by mouth every 6 (six) hours. 05/01/15   Tiffany Carlota Raspberry, PA-C   BP 135/100 mmHg  Pulse 80  Temp(Src) 97.8 F (36.6 C) (Oral)  Resp 14  SpO2 100%  LMP  09/23/2015 Physical Exam  Constitutional: She is oriented to person, place, and time. She appears well-developed and well-nourished. No distress.  HENT:  Head: Normocephalic and atraumatic.  Mouth/Throat: Oropharynx is clear and moist.  Eyes: EOM are normal. Pupils are equal, round, and reactive to light.  Neck: Normal range of motion. Neck supple.  Cardiovascular: Normal rate and regular rhythm.  Exam reveals no friction rub.   No murmur heard. Pulmonary/Chest: Effort normal and breath sounds normal. No respiratory distress. She has no wheezes. She has no rales.  Abdominal: Soft. She exhibits no distension. There is tenderness (mild suprapubic pain). There is no rebound.  No reproducible back pain.  Musculoskeletal: Normal range of motion. She exhibits no edema.       Back:  Neurological: She is alert and oriented to person, place, and time.  Skin: She is not diaphoretic.  Nursing note and vitals reviewed.   ED Course  Procedures (including critical care time) Labs Review Labs Reviewed  WET PREP, GENITAL - Abnormal; Notable for the following:    Clue Cells Wet Prep HPF POC MODERATE (*)    WBC, Wet Prep HPF POC FEW (*)    All other components within normal limits  COMPREHENSIVE METABOLIC PANEL - Abnormal; Notable for the following:    ALT 13 (*)    All other components within normal limits  CBC - Abnormal; Notable for the following:    RBC 3.74 (*)    Hemoglobin 9.2 (*)    HCT 30.3 (*)    MCH 24.6 (*)    RDW 21.2 (*)    All other components within normal limits  URINALYSIS, ROUTINE W REFLEX MICROSCOPIC (NOT AT [facility]) - Abnormal; Notable for the following:    Color, Urine AMBER (*)    APPearance CLOUDY (*)    Specific Gravity, Urine 1.033 (*)    Bilirubin Urine SMALL (*)    Leukocytes, UA SMALL (*)    All other components within normal limits  URINE MICROSCOPIC-ADD ON - Abnormal; Notable for the following:    Squamous Epithelial / LPF MANY (*)    Bacteria, UA MANY (*)      All other components within normal limits  LIPASE, BLOOD  GC/CHLAMYDIA PROBE AMP ([place]) NOT AT [facility]    Imaging Review Dg Chest 2 View  10/03/2015  CLINICAL DATA:  Shortness of breath, lower abdominal pain.  Dysuria. EXAM: CHEST  2 VIEW COMPARISON:  12/27/2013 FINDINGS: Heart is borderline in size. Lungs are clear. No effusions or edema. No acute bony abnormality. IMPRESSION: No active cardiopulmonary disease. Electronically Signed   By: Rolm Baptise M.D.   On: 10/03/2015 12:07   I have personally reviewed and evaluated these images and lab results as part of my medical decision-making.  EKG Interpretation None      MDM   Final diagnoses:  Acute cystitis without hematuria  Bacterial vaginosis    70F here with multiple complaints. Back pain: Present since she was percussed for CVA tenderness at [facility] appointment. I cannot find any mention of this appointment. She has pain with deep breathing and moving. Saw located in her left midthoracic region. She is extremity to palpation. Think this lady muscle skeletal pain, will get a chest x-ray to look for possible rib fracture. Lower abdominal pain: She's had some dysuria and some possible vaginal discharge. She is worried she might have vaginitis that she's had history of this before. She has little to no belly tenderness on exam, no need for CT scan. No CVA tenderness noted. Will obtain pelvic exam, labs, urine.  Pelvic exam with mild cervical tenderness. She reports no concern for STDs. Mild white discharge. Wet prep positive for BV. UA shows UTI. Will treat with keflex and metro-gel.    Evelina Bucy, MD 10/03/15 1539

## 2015-10-03 NOTE — ED Notes (Signed)
PA student at bedside.

## 2015-10-03 NOTE — Discharge Instructions (Signed)
Bacterial Vaginosis °Bacterial vaginosis is a vaginal infection that occurs when the normal balance of bacteria in the vagina is disrupted. It results from an overgrowth of certain bacteria. This is the most common vaginal infection in women of childbearing age. Treatment is important to prevent complications, especially in pregnant women, as it can cause a premature delivery. °CAUSES  °Bacterial vaginosis is caused by an increase in harmful bacteria that are normally present in smaller amounts in the vagina. Several different kinds of bacteria can cause bacterial vaginosis. However, the reason that the condition develops is not fully understood. °RISK FACTORS °Certain activities or behaviors can put you at an increased risk of developing bacterial vaginosis, including: °· Having a new sex partner or multiple sex partners. °· Douching. °· Using an intrauterine device (IUD) for contraception. °Women do not get bacterial vaginosis from toilet seats, bedding, swimming pools, or contact with objects around them. °SIGNS AND SYMPTOMS  °Some women with bacterial vaginosis have no signs or symptoms. Common symptoms include: °· Grey vaginal discharge. °· A fishlike odor with discharge, especially after sexual intercourse. °· Itching or burning of the vagina and vulva. °· Burning or pain with urination. °DIAGNOSIS  °Your health care provider will take a medical history and examine the vagina for signs of bacterial vaginosis. A sample of vaginal fluid may be taken. Your health care provider will look at this sample under a microscope to check for bacteria and abnormal cells. A vaginal pH test may also be done.  °TREATMENT  °Bacterial vaginosis may be treated with antibiotic medicines. These may be given in the form of a pill or a vaginal cream. A second round of antibiotics may be prescribed if the condition comes back after treatment. Because bacterial vaginosis increases your risk for sexually transmitted diseases, getting  treated can help reduce your risk for chlamydia, gonorrhea, HIV, and herpes. °HOME CARE INSTRUCTIONS  °· Only take over-the-counter or prescription medicines as directed by your health care provider. °· If antibiotic medicine was prescribed, take it as directed. Make sure you finish it even if you start to feel better. °· Tell all sexual partners that you have a vaginal infection. They should see their health care provider and be treated if they have problems, such as a mild rash or itching. °· During treatment, it is important that you follow these instructions: °· Avoid sexual activity or use condoms correctly. °· Do not douche. °· Avoid alcohol as directed by your health care provider. °· Avoid breastfeeding as directed by your health care provider. °SEEK MEDICAL CARE IF:  °· Your symptoms are not improving after 3 days of treatment. °· You have increased discharge or pain. °· You have a fever. °MAKE SURE YOU:  °· Understand these instructions. °· Will watch your condition. °· Will get help right away if you are not doing well or get worse. °FOR MORE INFORMATION  °Centers for Disease Control and Prevention, Division of STD Prevention: www.cdc.gov/std °American Sexual Health Association (ASHA): www.ashastd.org  °  °This information is not intended to replace advice given to you by your health care provider. Make sure you discuss any questions you have with your health care provider. °  °Document Released: 12/08/2005 Document Revised: 12/29/2014 Document Reviewed: 07/20/2013 °Elsevier Interactive Patient Education ©2016 Elsevier Inc. ° °Urinary Tract Infection °Urinary tract infections (UTIs) can develop anywhere along your urinary tract. Your urinary tract is your body's drainage system for removing wastes and extra water. Your urinary tract includes two kidneys, two ureters,   a bladder, and a urethra. Your kidneys are a pair of bean-shaped organs. Each kidney is about the size of your fist. They are located below  your ribs, one on each side of your spine. °CAUSES °Infections are caused by microbes, which are microscopic organisms, including fungi, viruses, and bacteria. These organisms are so small that they can only be seen through a microscope. Bacteria are the microbes that most commonly cause UTIs. °SYMPTOMS  °Symptoms of UTIs may vary by age and gender of the patient and by the location of the infection. Symptoms in young women typically include a frequent and intense urge to urinate and a painful, burning feeling in the bladder or urethra during urination. Older women and men are more likely to be tired, shaky, and weak and have muscle aches and abdominal pain. A fever may mean the infection is in your kidneys. Other symptoms of a kidney infection include pain in your back or sides below the ribs, nausea, and vomiting. °DIAGNOSIS °To diagnose a UTI, your caregiver will ask you about your symptoms. Your caregiver will also ask you to provide a urine sample. The urine sample will be tested for bacteria and white blood cells. White blood cells are made by your body to help fight infection. °TREATMENT  °Typically, UTIs can be treated with medication. Because most UTIs are caused by a bacterial infection, they usually can be treated with the use of antibiotics. The choice of antibiotic and length of treatment depend on your symptoms and the type of bacteria causing your infection. °HOME CARE INSTRUCTIONS °· If you were prescribed antibiotics, take them exactly as your caregiver instructs you. Finish the medication even if you feel better after you have only taken some of the medication. °· Drink enough water and fluids to keep your urine clear or pale yellow. °· Avoid caffeine, tea, and carbonated beverages. They tend to irritate your bladder. °· Empty your bladder often. Avoid holding urine for long periods of time. °· Empty your bladder before and after sexual intercourse. °· After a bowel movement, women should cleanse  from front to back. Use each tissue only once. °SEEK MEDICAL CARE IF:  °· You have back pain. °· You develop a fever. °· Your symptoms do not begin to resolve within 3 days. °SEEK IMMEDIATE MEDICAL CARE IF:  °· You have severe back pain or lower abdominal pain. °· You develop chills. °· You have nausea or vomiting. °· You have continued burning or discomfort with urination. °MAKE SURE YOU:  °· Understand these instructions. °· Will watch your condition. °· Will get help right away if you are not doing well or get worse. °  °This information is not intended to replace advice given to you by your health care provider. Make sure you discuss any questions you have with your health care provider. °  °Document Released: 09/17/2005 Document Revised: 08/29/2015 Document Reviewed: 01/16/2012 °Elsevier Interactive Patient Education ©2016 Elsevier Inc. ° °

## 2015-10-03 NOTE — ED Notes (Signed)
Pt states that she has been having low abd pain, dysuria, malodor.  States that she has had back pain ever since she had percussion assessment done on her flank the last time she was here.  No NVD.

## 2015-10-04 LAB — GC/CHLAMYDIA PROBE AMP (~~LOC~~) NOT AT ARMC
CHLAMYDIA, DNA PROBE: NEGATIVE
Neisseria Gonorrhea: NEGATIVE

## 2015-10-15 ENCOUNTER — Emergency Department (HOSPITAL_COMMUNITY): Payer: Medicare Other

## 2015-10-15 ENCOUNTER — Emergency Department (HOSPITAL_COMMUNITY)
Admission: EM | Admit: 2015-10-15 | Discharge: 2015-10-15 | Disposition: A | Discharge status: Home / Self Care | Payer: Medicare Other | Attending: Emergency Medicine | Admitting: Emergency Medicine

## 2015-10-15 ENCOUNTER — Encounter (HOSPITAL_COMMUNITY): Payer: Self-pay

## 2015-10-15 DIAGNOSIS — I10 Essential (primary) hypertension: Secondary | ICD-10-CM | POA: Insufficient documentation

## 2015-10-15 DIAGNOSIS — Z720 Tobacco use: Secondary | ICD-10-CM | POA: Diagnosis not present

## 2015-10-15 DIAGNOSIS — R109 Unspecified abdominal pain: Secondary | ICD-10-CM | POA: Diagnosis present

## 2015-10-15 DIAGNOSIS — Z862 Personal history of diseases of the blood and blood-forming organs and certain disorders involving the immune mechanism: Secondary | ICD-10-CM | POA: Diagnosis not present

## 2015-10-15 DIAGNOSIS — Z87828 Personal history of other (healed) physical injury and trauma: Secondary | ICD-10-CM | POA: Insufficient documentation

## 2015-10-15 DIAGNOSIS — Z8639 Personal history of other endocrine, nutritional and metabolic disease: Secondary | ICD-10-CM | POA: Insufficient documentation

## 2015-10-15 DIAGNOSIS — R102 Pelvic and perineal pain: Secondary | ICD-10-CM | POA: Diagnosis not present

## 2015-10-15 DIAGNOSIS — Z86018 Personal history of other benign neoplasm: Secondary | ICD-10-CM | POA: Diagnosis not present

## 2015-10-15 LAB — CBC
HCT: 29.8 % — ABNORMAL LOW (ref 36.0–46.0)
HEMOGLOBIN: 9.2 g/dL — AB (ref 12.0–15.0)
MCH: 24.5 pg — ABNORMAL LOW (ref 26.0–34.0)
MCHC: 30.9 g/dL (ref 30.0–36.0)
MCV: 79.5 fL (ref 78.0–100.0)
PLATELETS: 198 10*3/uL (ref 150–400)
RBC: 3.75 MIL/uL — ABNORMAL LOW (ref 3.87–5.11)
RDW: 20.3 % — AB (ref 11.5–15.5)
WBC: 4.5 10*3/uL (ref 4.0–10.5)

## 2015-10-15 LAB — URINALYSIS, ROUTINE W REFLEX MICROSCOPIC
BILIRUBIN URINE: NEGATIVE
GLUCOSE, UA: NEGATIVE mg/dL
HGB URINE DIPSTICK: NEGATIVE
Ketones, ur: NEGATIVE mg/dL
Nitrite: NEGATIVE
PROTEIN: NEGATIVE mg/dL
Specific Gravity, Urine: 1.022 (ref 1.005–1.030)
UROBILINOGEN UA: 0.2 mg/dL (ref 0.0–1.0)
pH: 6 (ref 5.0–8.0)

## 2015-10-15 LAB — COMPREHENSIVE METABOLIC PANEL
ALBUMIN: 4 g/dL (ref 3.5–5.0)
ALK PHOS: 50 U/L (ref 38–126)
ALT: 10 U/L — AB (ref 14–54)
ANION GAP: 5 (ref 5–15)
AST: 17 U/L (ref 15–41)
BILIRUBIN TOTAL: 0.8 mg/dL (ref 0.3–1.2)
BUN: 12 mg/dL (ref 6–20)
CALCIUM: 8.9 mg/dL (ref 8.9–10.3)
CO2: 24 mmol/L (ref 22–32)
CREATININE: 0.78 mg/dL (ref 0.44–1.00)
Chloride: 107 mmol/L (ref 101–111)
GFR calc Af Amer: >60 mL/min (ref >60)
GFR calc non Af Amer: >60 mL/min (ref >60)
GLUCOSE: 82 mg/dL (ref 65–99)
Potassium: 4 mmol/L (ref 3.5–5.1)
Sodium: 136 mmol/L (ref 135–145)
TOTAL PROTEIN: 7.4 g/dL (ref 6.5–8.1)

## 2015-10-15 LAB — WET PREP, GENITAL
CLUE CELLS WET PREP: NONE SEEN
TRICH WET PREP: NONE SEEN
YEAST WET PREP: NONE SEEN

## 2015-10-15 LAB — LIPASE, BLOOD: Lipase: 33 U/L (ref 11–51)

## 2015-10-15 LAB — HCG, QUANTITATIVE, PREGNANCY

## 2015-10-15 LAB — URINE MICROSCOPIC-ADD ON

## 2015-10-15 MED ORDER — HYDROCODONE-ACETAMINOPHEN 5-325 MG PO TABS: 1.0000 | ORAL_TABLET | Freq: Four times a day (QID) | ORAL | Status: DC | PRN

## 2015-10-15 NOTE — ED Notes (Addendum)
Pt here with vaginal discharge.  Pt here a week ago with abdominal pain and dx with cystitis and BV.  Pt given meds including suppositories.  Pt also states back pain.  "something is not right with my body".  Pt having painful urination.  States discharge is vaginal and not urethral.

## 2015-10-15 NOTE — ED Provider Notes (Signed)
CSN: [id number]     Arrival date & time 10/15/15  1204 History   First MD Initiated Contact with Patient 10/15/15 1445     Chief Complaint  Patient presents with  . Abdominal Pain     (Consider location/radiation/quality/duration/timing/severity/associated sxs/prior Treatment) HPI   [name] is a 49 y.o. female who is here for evaluation of persistent pelvic pain, vaginal discharge, left flank pain, and "feeling bad". Symptoms recurrent over the last month. She was treated in the ED, 12 days ago for vaginitis, with MetroGel. She had screening labs done at that time which have returned as normal. The vaginal discharge is malodorous. No fever, chills, nausea, vomiting, weakness or dizziness. There are no other known modifying factors   Past Medical History  Diagnosis Date  . Hypertension   . Anemia   . Fibroids   . Thyroid goiter   . Knee joint injury   . Hypertension     no current meds for htn   Past Surgical History  Procedure Laterality Date  . Cesarean section    . Replacement total knee    . Hand surgery    . Ankle surgery    . Ankle surgery Right 2009    broken ankle   . Tubal ligation    . Replacement total knee Right 2012  . Arthroscopic surgery to knee Bilateral   . 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 Hypertension Mother   . Hypertension Father   . Cancer Maternal Grandmother     ? lung   Social History  Substance Use Topics  . Smoking status: Current Every Day Smoker -- 0.50 packs/day for 20 years    Types: Cigarettes  . Smokeless tobacco: Never Used  . Alcohol Use: 1.0 oz/week    2 drink(s) per week     Comment: occasionally once per month   OB History    Gravida Para Term Preterm AB TAB SAB Ectopic Multiple Living   7 4 4  0 3 1 1  0  2     Review of  Systems  All other systems reviewed and are negative.     Allergies  Tramadol  Home Medications   Prior to Admission medications   Medication Sig Start Date End Date Taking? Authorizing Provider  HYDROcodone-acetaminophen (NORCO/VICODIN) 5-325 MG tablet Take 1-2 tablets by mouth every 6 (six) hours as needed. 10/15/15   Daleen Bo, MD   BP 160/102 mmHg  Pulse 78  Temp(Src) 98.2 F (36.8 C) (Oral)  Resp 18  SpO2 100%  LMP 09/23/2015 Physical Exam  Constitutional: She is oriented to person, place, and time. She appears well-developed and well-nourished.  HENT:  Head: Normocephalic and atraumatic.  Right Ear: External ear normal.  Left Ear: External ear normal.  Eyes: Conjunctivae and EOM are normal. Pupils are equal, round, and reactive to light.  Neck: Normal range of motion and phonation normal. Neck supple.  Cardiovascular: Normal rate, regular rhythm and normal heart sounds.   Pulmonary/Chest: Effort normal and breath sounds normal. She exhibits no bony tenderness.  Abdominal: Soft. There is no tenderness.  Genitourinary:  Normal external female genitalia. Small moderate white vaginal discharge. Normal-appearing cervix. Mild left adnexal tenderness without palpable mass. Uterus was not palpable.  Musculoskeletal: Normal range of motion.  Neurological: She is  alert and oriented to person, place, and time. No cranial nerve deficit or sensory deficit. She exhibits normal muscle tone. Coordination normal.  Skin: Skin is warm, dry and intact.  Psychiatric: She has a normal mood and affect. Her behavior is normal. Judgment and thought content normal.  Nursing note and vitals reviewed.   ED Course  Procedures (including critical care time)   Medications - No data to display  Patient Vitals for the past 24 hrs:  BP Temp Temp src Pulse Resp SpO2  10/15/15 1725 (!) 160/102 mmHg - - 78 18 100 %  10/15/15 1213 152/100 mmHg 98.2 F (36.8 C) Oral 79 18 100 %    4:43 PM  Reevaluation with update and discussion. After initial assessment and treatment, an updated evaluation revealno additional complaints. Dray Dente L        Labs Review Labs Reviewed  WET PREP, GENITAL - Abnormal; Notable for the following:    WBC, Wet Prep HPF POC FEW (*)    All other components within normal limits  COMPREHENSIVE METABOLIC PANEL - Abnormal; Notable for the following:    ALT 10 (*)    All other components within normal limits  CBC - Abnormal; Notable for the following:    RBC 3.75 (*)    Hemoglobin 9.2 (*)    HCT 29.8 (*)    MCH 24.5 (*)    RDW 20.3 (*)    All other components within normal limits  URINALYSIS, ROUTINE W REFLEX MICROSCOPIC (NOT AT [facility]) - Abnormal; Notable for the following:    Color, Urine AMBER (*)    APPearance CLOUDY (*)    Leukocytes, UA TRACE (*)    All other components within normal limits  URINE MICROSCOPIC-ADD ON - Abnormal; Notable for the following:    Squamous Epithelial / LPF FEW (*)    Bacteria, UA FEW (*)    All other components within normal limits  LIPASE, BLOOD  HCG, QUANTITATIVE, PREGNANCY  RPR  HIV ANTIBODY (ROUTINE TESTING)  GC/CHLAMYDIA PROBE AMP ([place]) NOT AT [facility]    Imaging Review US Transvaginal Non-ob  10/15/2015  CLINICAL DATA:  Left greater than right pelvic pain EXAM: TRANSABDOMINAL AND TRANSVAGINAL ULTRASOUND OF PELVIS TECHNIQUE: Both transabdominal and transvaginal ultrasound examinations of the pelvis were performed. Transabdominal technique was performed for global imaging of the pelvis including uterus, ovaries, adnexal regions, and pelvic cul-de-sac. It was necessary to proceed with endovaginal exam following the transabdominal exam to visualize the ovaries. COMPARISON:  Prior pelvic ultrasound 05/01/2015 FINDINGS: Uterus Measurements: 10.1 x 5.6 x 4.5 cm. No fibroids or other mass visualized. Endometrium Thickness: 15 mm.  No focal abnormality visualized. Right ovary Measurements: 2.9 x 2.0 x 2.0  cm. Normal appearance/no adnexal mass. Left ovary Measurements: 3.6 x 2.4 x 2.5 cm. Normal appearance/no adnexal mass. Incidental 1.9 cm follicular cyst. Other findings Small free fluid in the cul-de-sac is likely physiologic. IMPRESSION: 1. No acute abnormality to explain the patient's pelvic pain. 2. An endometrial thickness of 15 mm would be considered within normal limits if the patient is pre menopausal. Electronically Signed   By: Jacqulynn Cadet M.D.   On: 10/15/2015 16:59   US Pelvis Complete  10/15/2015  CLINICAL DATA:  Left greater than right pelvic pain EXAM: TRANSABDOMINAL AND TRANSVAGINAL ULTRASOUND OF PELVIS TECHNIQUE: Both transabdominal and transvaginal ultrasound examinations of the pelvis were performed. Transabdominal technique was performed for global imaging of the pelvis including uterus, ovaries, adnexal regions, and pelvic cul-de-sac. It was necessary to  proceed with endovaginal exam following the transabdominal exam to visualize the ovaries. COMPARISON:  Prior pelvic ultrasound 05/01/2015 FINDINGS: Uterus Measurements: 10.1 x 5.6 x 4.5 cm. No fibroids or other mass visualized. Endometrium Thickness: 15 mm.  No focal abnormality visualized. Right ovary Measurements: 2.9 x 2.0 x 2.0 cm. Normal appearance/no adnexal mass. Left ovary Measurements: 3.6 x 2.4 x 2.5 cm. Normal appearance/no adnexal mass. Incidental 1.9 cm follicular cyst. Other findings Small free fluid in the cul-de-sac is likely physiologic. IMPRESSION: 1. No acute abnormality to explain the patient's pelvic pain. 2. An endometrial thickness of 15 mm would be considered within normal limits if the patient is pre menopausal. Electronically Signed   By: Jacqulynn Cadet M.D.   On: 10/15/2015 16:59   I have personally reviewed and evaluated these images and lab results as part of my medical decision-making.   EKG Interpretation None      MDM   Final diagnoses:  Pelvic pain in female    Nonspecific subacute  pelvic pain, with nondiagnostic pelvic ultrasound. No evidence for acute STD, suspect he'll infection, metabolic instability, or suspected impending vascular collapse.  Nursing Notes Reviewed/ Care Coordinated Applicable Imaging Reviewed Interpretation of Laboratory Data incorporated into ED treatment  The patient appears reasonably screened and/or stabilized for discharge and I doubt any other medical condition or other [facility] requiring further screening, evaluation, or treatment in the ED at this time prior to discharge.  Plan: Home Medications- Norco; Home Treatments- rest; return here if the recommended treatment, does not improve the symptoms; Recommended follow up- GYN f/u for diagnostic evaluation     Daleen Bo, MD 10/16/15 [id number]

## 2015-10-15 NOTE — Discharge Instructions (Signed)
Pain Without a Known Cause °WHAT IS PAIN WITHOUT A KNOWN CAUSE? °Pain can occur in any part of the body and can range from mild to severe. Sometimes no cause can be found for why you are having pain. Some types of pain that can occur without a known cause include:  °· Headache. °· Back pain. °· Abdominal pain. °· Neck pain. °HOW IS PAIN WITHOUT A KNOWN CAUSE DIAGNOSED?  °Your health care provider will try to find the cause of your pain. This may include: °· Physical exam. °· Medical history. °· Blood tests. °· Urine tests. °· X-rays. °If no cause is found, your health care provider may diagnose you with pain without a known cause.  °IS THERE TREATMENT FOR PAIN WITHOUT A CAUSE?  °Treatment depends on the kind of pain you have. Your health care provider may prescribe medicines to help relieve your pain.  °WHAT CAN I DO AT HOME FOR MY PAIN?  °· Take medicines only as directed by your health care provider. °· Stop any activities that cause pain. During periods of severe pain, bed rest may help. °· Try to reduce your stress with activities such as yoga or meditation. Talk to your health care provider for other stress-reducing activity recommendations. °· Exercise regularly, if approved by your health care provider. °· Eat a healthy diet that includes fruits and vegetables. This may improve pain. Talk to your health care provider if you have any questions about your diet. °WHAT IF MY PAIN DOES NOT GET BETTER?  °If you have a painful condition and no reason can be found for the pain or the pain gets worse, it is important to follow up with your health care provider. It may be necessary to repeat tests and look further for a possible cause.  °  °This information is not intended to replace advice given to you by your health care provider. Make sure you discuss any questions you have with your health care provider. °  °Document Released: 09/02/2001 Document Revised: 12/29/2014 Document Reviewed: 04/25/2014 °Elsevier  Interactive Patient Education ©2016 Elsevier Inc. ° °

## 2015-10-16 LAB — GC/CHLAMYDIA PROBE AMP (~~LOC~~) NOT AT ARMC
CHLAMYDIA, DNA PROBE: NEGATIVE
NEISSERIA GONORRHEA: NEGATIVE

## 2015-10-16 LAB — RPR: RPR Ser Ql: NONREACTIVE

## 2015-10-18 LAB — HIV 1/2 AB DIFFERENTIATION
HIV 1 AB: NEGATIVE
HIV 2 Ab: NEGATIVE

## 2015-10-18 LAB — RNA QUALITATIVE: HIV 1 RNA Qualitative: 1

## 2015-10-18 LAB — HIV ANTIBODY (ROUTINE TESTING W REFLEX)

## 2015-11-28 ENCOUNTER — Emergency Department (HOSPITAL_COMMUNITY)
Admission: EM | Admit: 2015-11-28 | Discharge: 2015-11-28 | Discharge status: Against Medical Advice | Payer: Medicare Other | Attending: Emergency Medicine | Admitting: Emergency Medicine

## 2015-11-28 ENCOUNTER — Encounter (HOSPITAL_COMMUNITY): Payer: Self-pay

## 2015-11-28 DIAGNOSIS — N898 Other specified noninflammatory disorders of vagina: Secondary | ICD-10-CM | POA: Insufficient documentation

## 2015-11-28 DIAGNOSIS — I10 Essential (primary) hypertension: Secondary | ICD-10-CM | POA: Insufficient documentation

## 2015-11-28 DIAGNOSIS — R1030 Lower abdominal pain, unspecified: Secondary | ICD-10-CM | POA: Insufficient documentation

## 2015-11-28 DIAGNOSIS — R300 Dysuria: Secondary | ICD-10-CM | POA: Diagnosis not present

## 2015-11-28 LAB — URINALYSIS, ROUTINE W REFLEX MICROSCOPIC
Glucose, UA: NEGATIVE mg/dL
Hgb urine dipstick: NEGATIVE
Ketones, ur: NEGATIVE mg/dL
LEUKOCYTES UA: NEGATIVE
NITRITE: NEGATIVE
PH: 5.5 (ref 5.0–8.0)
Protein, ur: 30 mg/dL — AB
SPECIFIC GRAVITY, URINE: 1.031 — AB (ref 1.005–1.030)

## 2015-11-28 LAB — URINE MICROSCOPIC-ADD ON

## 2015-11-28 NOTE — ED Notes (Signed)
Patient states she thought she had a yeast infection on 11/21/15. Patient states she used OTC Monistat and the next day she used a douche. Patient now c/o lower abdominal pain, dysuria, vaginal discharge with foul odor.

## 2015-11-28 NOTE — ED Notes (Signed)
No answer from lobby  

## 2015-12-10 ENCOUNTER — Emergency Department (HOSPITAL_COMMUNITY)
Admission: EM | Admit: 2015-12-10 | Discharge: 2015-12-10 | Disposition: A | Discharge status: Home / Self Care | Payer: Medicare Other | Attending: Emergency Medicine | Admitting: Emergency Medicine

## 2015-12-10 ENCOUNTER — Encounter (HOSPITAL_COMMUNITY): Payer: Self-pay | Admitting: Emergency Medicine

## 2015-12-10 DIAGNOSIS — Z8639 Personal history of other endocrine, nutritional and metabolic disease: Secondary | ICD-10-CM | POA: Diagnosis not present

## 2015-12-10 DIAGNOSIS — R300 Dysuria: Secondary | ICD-10-CM | POA: Diagnosis present

## 2015-12-10 DIAGNOSIS — Z3202 Encounter for pregnancy test, result negative: Secondary | ICD-10-CM | POA: Insufficient documentation

## 2015-12-10 DIAGNOSIS — Z862 Personal history of diseases of the blood and blood-forming organs and certain disorders involving the immune mechanism: Secondary | ICD-10-CM | POA: Diagnosis not present

## 2015-12-10 DIAGNOSIS — N760 Acute vaginitis: Secondary | ICD-10-CM | POA: Diagnosis not present

## 2015-12-10 DIAGNOSIS — Z87828 Personal history of other (healed) physical injury and trauma: Secondary | ICD-10-CM | POA: Diagnosis not present

## 2015-12-10 DIAGNOSIS — I10 Essential (primary) hypertension: Secondary | ICD-10-CM | POA: Insufficient documentation

## 2015-12-10 DIAGNOSIS — B9689 Other specified bacterial agents as the cause of diseases classified elsewhere: Secondary | ICD-10-CM | POA: Diagnosis not present

## 2015-12-10 DIAGNOSIS — F17210 Nicotine dependence, cigarettes, uncomplicated: Secondary | ICD-10-CM | POA: Diagnosis not present

## 2015-12-10 DIAGNOSIS — Z86018 Personal history of other benign neoplasm: Secondary | ICD-10-CM | POA: Diagnosis not present

## 2015-12-10 LAB — URINALYSIS, ROUTINE W REFLEX MICROSCOPIC
Glucose, UA: NEGATIVE mg/dL
Hgb urine dipstick: NEGATIVE
KETONES UR: 15 mg/dL — AB
NITRITE: NEGATIVE
PROTEIN: NEGATIVE mg/dL
Specific Gravity, Urine: 1.027 (ref 1.005–1.030)
pH: 5.5 (ref 5.0–8.0)

## 2015-12-10 LAB — WET PREP, GENITAL
Sperm: NONE SEEN
TRICH WET PREP: NONE SEEN
YEAST WET PREP: NONE SEEN

## 2015-12-10 LAB — POC URINE PREG, ED: Preg Test, Ur: NEGATIVE

## 2015-12-10 LAB — URINE MICROSCOPIC-ADD ON: RBC / HPF: NONE SEEN RBC/hpf (ref 0–5)

## 2015-12-10 MED ORDER — METRONIDAZOLE 500 MG PO TABS: 500.0000 mg | ORAL_TABLET | Freq: Three times a day (TID) | ORAL | Status: DC

## 2015-12-10 MED ORDER — METRONIDAZOLE 500 MG PO TABS
500.0000 mg | ORAL_TABLET | Freq: Once | ORAL | Status: AC
  Administered 2015-12-10: 500 mg via ORAL
  Filled 2015-12-10: qty 1

## 2015-12-10 NOTE — ED Provider Notes (Signed)
CSN: [id number]     Arrival date & time 12/10/15  [id number] History   First MD Initiated Contact with Patient 12/10/15 0919     Chief Complaint  Patient presents with  . Vaginal Discharge  . Dysuria      HPI  History this may wish and vaginal burning. States that she had some discharge for several days burning for the last 10 days. Tried over-the-counter Monistat followed by vaginal douche and had burning and pain.  Past Medical History  Diagnosis Date  . Hypertension   . Anemia   . Fibroids   . Thyroid goiter   . Knee joint injury   . Hypertension     no current meds for htn   Past Surgical History  Procedure Laterality Date  . Cesarean section    . Replacement total knee    . Hand surgery    . Ankle surgery    . Ankle surgery Right 2009    broken ankle   . Tubal ligation    . Replacement total knee Right 2012  . Arthroscopic surgery to knee Bilateral   . 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 Hypertension Mother   . Hypertension Father   . Cancer Maternal Grandmother     ? lung   Social History  Substance Use Topics  . Smoking status: Current Every Day Smoker -- 0.20 packs/day for 0 years    Types: Cigarettes  . Smokeless tobacco: Never Used  . Alcohol Use: 1.0 oz/week    2 Standard drinks or equivalent per week     Comment: occasionally once per month   OB History    Gravida Para Term Preterm AB TAB SAB Ectopic Multiple Living   7 4 4  0 3 1 1  0  2     Review of Systems  Constitutional: Negative for fever, chills, diaphoresis, appetite change and fatigue.  HENT: Negative for mouth sores, sore throat and trouble swallowing.   Eyes: Negative for visual disturbance.  Respiratory: Negative for cough, chest tightness, shortness of breath and wheezing.   Cardiovascular:  Negative for chest pain.  Gastrointestinal: Negative for nausea, vomiting, abdominal pain, diarrhea and abdominal distention.  Endocrine: Negative for polydipsia, polyphagia and polyuria.  Genitourinary: Positive for vaginal bleeding, vaginal discharge and vaginal pain. Negative for dysuria, frequency and hematuria.  Musculoskeletal: Negative for gait problem.  Skin: Negative for color change, pallor and rash.  Neurological: Negative for dizziness, syncope, light-headedness and headaches.  Hematological: Does not bruise/bleed easily.  Psychiatric/Behavioral: Negative for behavioral problems and confusion.      Allergies  Tramadol  Home Medications   Prior to Admission medications   Medication Sig Start Date End Date Taking? Authorizing Provider  HYDROcodone-acetaminophen (NORCO/VICODIN) 5-325 MG tablet Take 1-2 tablets by mouth every 6 (six) hours as needed. Patient not taking: Reported on 12/10/2015 10/15/15   Daleen Bo, MD  metroNIDAZOLE (FLAGYL) 500 MG tablet Take 1 tablet (500 mg total) by mouth 3 (three) times daily. 12/10/15   Tanna Furry, MD   BP 136/77 mmHg  Pulse 71  Temp(Src) 98.4 F (36.9 C) (Oral)  Resp 16  SpO2 100%  LMP 11/21/2015 Physical Exam  Constitutional: She is oriented to person, place, and time. She appears well-developed and well-nourished. No distress.  HENT:  Head: Normocephalic.  Eyes: Conjunctivae are normal. Pupils are equal, round, and reactive to light. No scleral icterus.  Neck: Normal range of motion. Neck supple. No thyromegaly present.  Cardiovascular: Normal rate and regular rhythm.  Exam reveals no gallop and no friction rub.   No murmur heard. Pulmonary/Chest: Effort normal and breath sounds normal. No respiratory distress. She has no wheezes. She has no rales.  Abdominal: Soft. Bowel sounds are normal. She exhibits no distension. There is no tenderness. There is no rebound.  Genitourinary:    Musculoskeletal: Normal range of  motion.  Neurological: She is alert and oriented to person, place, and time.  Skin: Skin is warm and dry. No rash noted.  Psychiatric: She has a normal mood and affect. Her behavior is normal.    ED Course  Procedures (including critical care time) Labs Review Labs Reviewed  WET PREP, GENITAL - Abnormal; Notable for the following:    Clue Cells Wet Prep HPF POC PRESENT (*)    WBC, Wet Prep HPF POC MODERATE (*)    All other components within normal limits  URINALYSIS, ROUTINE W REFLEX MICROSCOPIC (NOT AT [facility]) - Abnormal; Notable for the following:    Color, Urine AMBER (*)    APPearance CLOUDY (*)    Bilirubin Urine SMALL (*)    Ketones, ur 15 (*)    Leukocytes, UA SMALL (*)    All other components within normal limits  URINE MICROSCOPIC-ADD ON - Abnormal; Notable for the following:    Squamous Epithelial / LPF 0-5 (*)    Bacteria, UA FEW (*)    All other components within normal limits  POC URINE PREG, ED  GC/CHLAMYDIA PROBE AMP ([place]) NOT AT [facility]    Imaging Review No results found. I have personally reviewed and evaluated these images and lab results as part of my medical decision-making.   EKG Interpretation None      MDM   Final diagnoses:  BV (bacterial vaginosis)    BV/clue cells noted on exam and wet prep. Urine does not appear infected. Plan is Flagyl. Primary care follow-up.    Tanna Furry, MD 12/10/15 1235

## 2015-12-10 NOTE — ED Notes (Signed)
Patient comes from home with c/o vaginal bleeding x1 episode.  Patient states she saw the blood on tissue after wiping.  Patient also c/o white, pruritic discharge for several days.  Patient endorses nausea, but denies vomiting.  Patient endorses bilateral abdominal pain.  Patient denies fever and diarrhea.  Patient denies hematuria, but isn't certain.  Patient has bilateral CVA tenderness to palpation.  Patient's abdomen is soft and non-tender to palpation.

## 2015-12-10 NOTE — ED Notes (Signed)
Per pt, states vaginal discharge and painful urination for 2 weeks-OTC meds not working

## 2015-12-10 NOTE — Discharge Instructions (Signed)

## 2015-12-11 LAB — GC/CHLAMYDIA PROBE AMP (~~LOC~~) NOT AT ARMC
Chlamydia: NEGATIVE
Neisseria Gonorrhea: NEGATIVE

## 2016-01-21 ENCOUNTER — Emergency Department (HOSPITAL_COMMUNITY): Payer: Medicare Other

## 2016-01-21 ENCOUNTER — Emergency Department (HOSPITAL_COMMUNITY)
Admission: EM | Admit: 2016-01-21 | Discharge: 2016-01-21 | Disposition: A | Discharge status: Home / Self Care | Payer: Medicare Other | Attending: Emergency Medicine | Admitting: Emergency Medicine

## 2016-01-21 ENCOUNTER — Encounter (HOSPITAL_COMMUNITY): Payer: Self-pay | Admitting: Family Medicine

## 2016-01-21 DIAGNOSIS — Z3202 Encounter for pregnancy test, result negative: Secondary | ICD-10-CM | POA: Diagnosis not present

## 2016-01-21 DIAGNOSIS — D649 Anemia, unspecified: Secondary | ICD-10-CM | POA: Insufficient documentation

## 2016-01-21 DIAGNOSIS — F17210 Nicotine dependence, cigarettes, uncomplicated: Secondary | ICD-10-CM | POA: Insufficient documentation

## 2016-01-21 DIAGNOSIS — N390 Urinary tract infection, site not specified: Secondary | ICD-10-CM | POA: Diagnosis not present

## 2016-01-21 DIAGNOSIS — Z8639 Personal history of other endocrine, nutritional and metabolic disease: Secondary | ICD-10-CM | POA: Insufficient documentation

## 2016-01-21 DIAGNOSIS — Z86018 Personal history of other benign neoplasm: Secondary | ICD-10-CM | POA: Insufficient documentation

## 2016-01-21 DIAGNOSIS — R109 Unspecified abdominal pain: Secondary | ICD-10-CM | POA: Diagnosis not present

## 2016-01-21 DIAGNOSIS — I10 Essential (primary) hypertension: Secondary | ICD-10-CM | POA: Insufficient documentation

## 2016-01-21 DIAGNOSIS — N898 Other specified noninflammatory disorders of vagina: Secondary | ICD-10-CM | POA: Diagnosis present

## 2016-01-21 DIAGNOSIS — Z87828 Personal history of other (healed) physical injury and trauma: Secondary | ICD-10-CM | POA: Diagnosis not present

## 2016-01-21 LAB — CBC WITH DIFFERENTIAL/PLATELET
BASOS ABS: 0 10*3/uL (ref 0.0–0.1)
BASOS PCT: 0 %
Eosinophils Absolute: 0.2 10*3/uL (ref 0.0–0.7)
Eosinophils Relative: 4 %
HEMATOCRIT: 28.5 % — AB (ref 36.0–46.0)
Hemoglobin: 8.7 g/dL — ABNORMAL LOW (ref 12.0–15.0)
Lymphocytes Relative: 37 %
Lymphs Abs: 1.8 10*3/uL (ref 0.7–4.0)
MCH: 23.6 pg — ABNORMAL LOW (ref 26.0–34.0)
MCHC: 30.5 g/dL (ref 30.0–36.0)
MCV: 77.2 fL — ABNORMAL LOW (ref 78.0–100.0)
MONO ABS: 0.3 10*3/uL (ref 0.1–1.0)
Monocytes Relative: 6 %
NEUTROS ABS: 2.5 10*3/uL (ref 1.7–7.7)
Neutrophils Relative %: 53 %
PLATELETS: 262 10*3/uL (ref 150–400)
RBC: 3.69 MIL/uL — AB (ref 3.87–5.11)
RDW: 20.7 % — AB (ref 11.5–15.5)
WBC: 4.8 10*3/uL (ref 4.0–10.5)

## 2016-01-21 LAB — URINALYSIS, ROUTINE W REFLEX MICROSCOPIC
Glucose, UA: NEGATIVE mg/dL
Hgb urine dipstick: NEGATIVE
Ketones, ur: NEGATIVE mg/dL
Nitrite: NEGATIVE
Protein, ur: NEGATIVE mg/dL
SPECIFIC GRAVITY, URINE: 1.028 (ref 1.005–1.030)
pH: 5 (ref 5.0–8.0)

## 2016-01-21 LAB — WET PREP, GENITAL
CLUE CELLS WET PREP: NONE SEEN
SPERM: NONE SEEN
TRICH WET PREP: NONE SEEN
YEAST WET PREP: NONE SEEN

## 2016-01-21 LAB — URINE MICROSCOPIC-ADD ON: RBC / HPF: NONE SEEN RBC/hpf (ref 0–5)

## 2016-01-21 LAB — I-STAT CHEM 8, ED
BUN: 9 mg/dL (ref 6–20)
CREATININE: 0.8 mg/dL (ref 0.44–1.00)
Calcium, Ion: 1.18 mmol/L (ref 1.12–1.23)
Chloride: 107 mmol/L (ref 101–111)
Glucose, Bld: 80 mg/dL (ref 65–99)
HEMATOCRIT: 31 % — AB (ref 36.0–46.0)
HEMOGLOBIN: 10.5 g/dL — AB (ref 12.0–15.0)
Potassium: 3.7 mmol/L (ref 3.5–5.1)
SODIUM: 140 mmol/L (ref 135–145)
TCO2: 21 mmol/L (ref 0–100)

## 2016-01-21 LAB — PREGNANCY, URINE: Preg Test, Ur: NEGATIVE

## 2016-01-21 MED ORDER — SODIUM CHLORIDE 0.9 % IV BOLUS (SEPSIS)
500.0000 mL | Freq: Once | INTRAVENOUS | Status: AC
  Administered 2016-01-21: 500 mL via INTRAVENOUS

## 2016-01-21 MED ORDER — CEPHALEXIN 500 MG PO CAPS: 500.0000 mg | ORAL_CAPSULE | Freq: Two times a day (BID) | ORAL | Status: DC

## 2016-01-21 MED ORDER — IOHEXOL 300 MG/ML  SOLN
100.0000 mL | Freq: Once | INTRAMUSCULAR | Status: AC | PRN
  Administered 2016-01-21: 100 mL via INTRAVENOUS

## 2016-01-21 MED ORDER — FERROUS SULFATE 325 (65 FE) MG PO TABS: 325.0000 mg | ORAL_TABLET | Freq: Two times a day (BID) | ORAL | Status: DC

## 2016-01-21 MED ORDER — FLUCONAZOLE 150 MG PO TABS: 150.0000 mg | ORAL_TABLET | Freq: Once | ORAL | Status: AC

## 2016-01-21 MED ORDER — HYDROCODONE-ACETAMINOPHEN 5-325 MG PO TABS
1.0000 | ORAL_TABLET | Freq: Four times a day (QID) | ORAL | Status: DC | PRN
Start: 2016-01-21 — End: 2016-04-29

## 2016-01-21 NOTE — ED Notes (Signed)
Pt here for abd pain, yellow discharge, nausea. sts that she was treated for yeast infection and worse.

## 2016-01-21 NOTE — Discharge Instructions (Signed)
Urinary Tract Infection Urinary tract infections (UTIs) can develop anywhere along your urinary tract. Your urinary tract is your body's drainage system for removing wastes and extra water. Your urinary tract includes two kidneys, two ureters, a bladder, and a urethra. Your kidneys are a pair of bean-shaped organs. Each kidney is about the size of your fist. They are located below your ribs, one on each side of your spine. CAUSES Infections are caused by microbes, which are microscopic organisms, including fungi, viruses, and bacteria. These organisms are so small that they can only be seen through a microscope. Bacteria are the microbes that most commonly cause UTIs. SYMPTOMS  Symptoms of UTIs may vary by age and gender of the patient and by the location of the infection. Symptoms in young women typically include a frequent and intense urge to urinate and a painful, burning feeling in the bladder or urethra during urination. Older women and men are more likely to be tired, shaky, and weak and have muscle aches and abdominal pain. A fever may mean the infection is in your kidneys. Other symptoms of a kidney infection include pain in your back or sides below the ribs, nausea, and vomiting. DIAGNOSIS To diagnose a UTI, your caregiver will ask you about your symptoms. Your caregiver will also ask you to provide a urine sample. The urine sample will be tested for bacteria and white blood cells. White blood cells are made by your body to help fight infection. TREATMENT  Typically, UTIs can be treated with medication. Because most UTIs are caused by a bacterial infection, they usually can be treated with the use of antibiotics. The choice of antibiotic and length of treatment depend on your symptoms and the type of bacteria causing your infection. HOME CARE INSTRUCTIONS  If you were prescribed antibiotics, take them exactly as your caregiver instructs you. Finish the medication even if you feel better after  you have only taken some of the medication.  Drink enough water and fluids to keep your urine clear or pale yellow.  Avoid caffeine, tea, and carbonated beverages. They tend to irritate your bladder.  Empty your bladder often. Avoid holding urine for long periods of time.  Empty your bladder before and after sexual intercourse.  After a bowel movement, women should cleanse from front to back. Use each tissue only once. SEEK MEDICAL CARE IF:   You have back pain.  You develop a fever.  Your symptoms do not begin to resolve within 3 days. SEEK IMMEDIATE MEDICAL CARE IF:   You have severe back pain or lower abdominal pain.  You develop chills.  You have nausea or vomiting.  You have continued burning or discomfort with urination. MAKE SURE YOU:   Understand these instructions.  Will watch your condition.  Will get help right away if you are not doing well or get worse.   This information is not intended to replace advice given to you by your health care provider. Make sure you discuss any questions you have with your health care provider.   Document Released: 09/17/2005 Document Revised: 08/29/2015 Document Reviewed: 01/16/2012 Elsevier Interactive Patient Education 2016 [employer].  Anemia, Nonspecific Anemia is a condition in which the concentration of red blood cells or hemoglobin in the blood is below normal. Hemoglobin is a substance in red blood cells that carries oxygen to the tissues of the body. Anemia results in not enough oxygen reaching these tissues.  CAUSES  Common causes of anemia include:   Excessive bleeding.  Bleeding may be internal or external. This includes excessive bleeding from periods (in women) or from the intestine.   Poor nutrition.   Chronic kidney, thyroid, and liver disease.  Bone marrow disorders that decrease red blood cell production.  Cancer and treatments for cancer.  HIV, AIDS, and their treatments.  Spleen problems that  increase red blood cell destruction.  Blood disorders.  Excess destruction of red blood cells due to infection, medicines, and autoimmune disorders. SIGNS AND SYMPTOMS   Minor weakness.   Dizziness.   Headache.  Palpitations.   Shortness of breath, especially with exercise.   Paleness.  Cold sensitivity.  Indigestion.  Nausea.  Difficulty sleeping.  Difficulty concentrating. Symptoms may occur suddenly or they may develop slowly.  DIAGNOSIS  Additional blood tests are often needed. These help your health care provider determine the best treatment. Your health care provider will check your stool for blood and look for other causes of blood loss.  TREATMENT  Treatment varies depending on the cause of the anemia. Treatment can include:   Supplements of iron, vitamin 123456, or folic acid.   Hormone medicines.   A blood transfusion. This may be needed if blood loss is severe.   Hospitalization. This may be needed if there is significant continual blood loss.   Dietary changes.  Spleen removal. HOME CARE INSTRUCTIONS Keep all follow-up appointments. It often takes many weeks to correct anemia, and having your health care provider check on your condition and your response to treatment is very important. SEEK IMMEDIATE MEDICAL CARE IF:   You develop extreme weakness, shortness of breath, or chest pain.   You become dizzy or have trouble concentrating.  You develop heavy vaginal bleeding.   You develop a rash.   You have bloody or black, tarry stools.   You faint.   You vomit up blood.   You vomit repeatedly.   You have abdominal pain.  You have a fever or persistent symptoms for more than 2-3 days.   You have a fever and your symptoms suddenly get worse.   You are dehydrated.  MAKE SURE YOU:  Understand these instructions.  Will watch your condition.  Will get help right away if you are not doing well or get worse.   This  information is not intended to replace advice given to you by your health care provider. Make sure you discuss any questions you have with your health care provider.   Document Released: 01/15/2005 Document Revised: 08/10/2013 Document Reviewed: 06/03/2013 Elsevier Interactive Patient Education [employer].

## 2016-01-21 NOTE — ED Provider Notes (Signed)
CSN: [id number]     Arrival date & time 01/21/16  1115 History   First MD Initiated Contact with Patient 01/21/16 1205     Chief Complaint  Patient presents with  . Vaginal Discharge      Patient is a 50 y.o. female presenting with vaginal discharge. The history is provided by the patient.  Vaginal Discharge Associated symptoms: abdominal pain and nausea   Associated symptoms: no vomiting    patient resents with vaginal discharge. States around 2 weeks ago she was seen in the ER for vaginal discharge/infection. (Records show it was more likely 6 weeks ago.) She states she took the Flagyl and everything got worse. She states she now has more white and now some yellow discharge. She has lower abdominal pain. No dysuria. No fevers. States she's only had unprotected sex with one partner. No diarrhea or constipation. No fevers.  Past Medical History  Diagnosis Date  . Hypertension   . Anemia   . Fibroids   . Thyroid goiter   . Knee joint injury   . Hypertension     no current meds for htn   Past Surgical History  Procedure Laterality Date  . Cesarean section    . Replacement total knee    . Hand surgery    . Ankle surgery    . Ankle surgery Right 2009    broken ankle   . Tubal ligation    . Replacement total knee Right 2012  . Arthroscopic surgery to knee Bilateral   . 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 Hypertension Mother   . Hypertension Father   . Cancer Maternal Grandmother     ? lung   Social History  Substance Use Topics  . Smoking status: Current Every Day Smoker -- 0.20 packs/day for 0 years    Types: Cigarettes  . Smokeless tobacco: Never Used  . Alcohol Use: 1.0 oz/week    2 Standard drinks or equivalent per week     Comment: occasionally once per month   OB History     Gravida Para Term Preterm AB TAB SAB Ectopic Multiple Living   7 4 4  0 3 1 1  0  2     Review of Systems  Constitutional: Negative for activity change and appetite change.  Eyes: Negative for pain.  Respiratory: Negative for chest tightness and shortness of breath.   Cardiovascular: Negative for chest pain and leg swelling.  Gastrointestinal: Positive for nausea and abdominal pain. Negative for vomiting and diarrhea.  Genitourinary: Positive for vaginal discharge. Negative for flank pain.  Musculoskeletal: Negative for back pain and neck stiffness.  Skin: Negative for rash.  Neurological: Negative for weakness, numbness and headaches.  Psychiatric/Behavioral: Negative for behavioral problems.      Allergies  Tramadol and Metronidazole  Home Medications   Prior to Admission medications   Medication Sig Start Date End Date Taking? Authorizing Provider  cephALEXin (KEFLEX) 500 MG capsule Take 1 capsule (500 mg total) by mouth 2 (two) times daily. 01/21/16   Davonna Belling, MD  ferrous sulfate 325 (65 FE) MG tablet Take 1 tablet (325 mg total) by mouth 2 (two) times daily with a meal. 01/21/16   Davonna Belling, MD  fluconazole (DIFLUCAN) 150 MG tablet Take 1 tablet (150 mg total) by mouth  once. If needed after antibiotics. 01/21/16 01/28/16  Davonna Belling, MD  HYDROcodone-acetaminophen (NORCO/VICODIN) 5-325 MG tablet Take 1-2 tablets by mouth every 6 (six) hours as needed. 01/21/16   Davonna Belling, MD  metroNIDAZOLE (FLAGYL) 500 MG tablet Take 1 tablet (500 mg total) by mouth 3 (three) times daily. Patient not taking: Reported on 01/21/2016 12/10/15   Tanna Furry, MD   BP 153/86 mmHg  Pulse 61  Temp(Src) 98.2 F (36.8 C) (Oral)  Resp 16  SpO2 100%  LMP 12/16/2015 Physical Exam  Constitutional: She appears well-developed.  Neck: Neck supple.  Cardiovascular: Normal rate.   Pulmonary/Chest: Effort normal.  Abdominal: Soft. There is tenderness.  Suprapubic to left lower  quadrant tenderness without rebound or guarding.  Musculoskeletal: Normal range of motion.  Neurological: She is alert.  Skin: Skin is warm.  Psychiatric: She has a normal mood and affect.   minimal white vaginal discharge. Some cervical motion tenderness. No adnexal tenderness.  ED Course  Procedures (including critical care time) Labs Review Labs Reviewed  WET PREP, GENITAL - Abnormal; Notable for the following:    WBC, Wet Prep HPF POC MANY (*)    All other components within normal limits  URINALYSIS, ROUTINE W REFLEX MICROSCOPIC (NOT AT [facility]) - Abnormal; Notable for the following:    APPearance CLOUDY (*)    Bilirubin Urine SMALL (*)    Leukocytes, UA MODERATE (*)    All other components within normal limits  URINE MICROSCOPIC-ADD ON - Abnormal; Notable for the following:    Squamous Epithelial / LPF 6-30 (*)    Bacteria, UA MANY (*)    All other components within normal limits  CBC WITH DIFFERENTIAL/PLATELET - Abnormal; Notable for the following:    RBC 3.69 (*)    Hemoglobin 8.7 (*)    HCT 28.5 (*)    MCV 77.2 (*)    MCH 23.6 (*)    RDW 20.7 (*)    All other components within normal limits  I-STAT CHEM 8, ED - Abnormal; Notable for the following:    Hemoglobin 10.5 (*)    HCT 31.0 (*)    All other components within normal limits  URINE CULTURE  PREGNANCY, URINE  RPR  HIV ANTIBODY (ROUTINE TESTING)  GC/CHLAMYDIA PROBE AMP ([place]) NOT AT [facility]    Imaging Review Ct Abdomen Pelvis W Contrast  01/21/2016  CLINICAL DATA:  Yellow discharge, abdominal pain EXAM: CT ABDOMEN AND PELVIS WITH CONTRAST TECHNIQUE: Multidetector CT imaging of the abdomen and pelvis was performed using the standard protocol following bolus administration of intravenous contrast. CONTRAST:  130mL OMNIPAQUE IOHEXOL 300 MG/ML  SOLN COMPARISON:  04/20/2014 FINDINGS: Lower chest:  Lung bases are clear.  Normal heart size. Hepatobiliary: Normal liver.  Normal gallbladder. Pancreas: Normal. Spleen:  Normal. Adrenals/Urinary Tract: Normal left adrenal gland. 16 mm left adrenal nodule unchanged in the prior examination likely reflecting an adrenal adenoma. Normal kidneys. No urolithiasis or obstructive uropathy. Relative bladder wall thickening which may reflect underdistention versus cystitis. Stomach/Bowel: No bowel wall thickening or dilatation. No pneumatosis, pneumoperitoneum or portal venous gas. Normal appendix. Small amount of pelvic free fluid. Vascular/Lymphatic: Normal caliber abdominal aorta. No abdominal aortic patch that no lymphadenopathy. Reproductive: No adnexal mass. Other: No fluid collection or hematoma. Musculoskeletal: No aggressive osseous lesion. No acute osseous abnormality. Bilateral facet arthropathy at L4-5 and L5-S1. Mild broad-based disc bulge at L4-5. IMPRESSION: 1. Mild of the bladder wall thickening which may reflect mild cystitis versus underdistention. 2. Small amount pelvic  free fluid likely physiologic. Electronically Signed   By: Kathreen Devoid   On: 01/21/2016 15:08   I have personally reviewed and evaluated these images and lab results as part of my medical decision-making.   EKG Interpretation None      MDM   Final diagnoses:  UTI (lower urinary tract infection)  Anemia, unspecified anemia type    Patient with abdominal pain. Wet prep is reassuring but does have some white cells on it. Reviewed previous STD testing which was negative. Urine shows UTI and CT scan done shows bladder wall thickening. Will treat for UTI. Culture also sent.    Davonna Belling, MD 01/21/16 [id number]

## 2016-01-22 LAB — GC/CHLAMYDIA PROBE AMP (~~LOC~~) NOT AT ARMC
Chlamydia: NEGATIVE
NEISSERIA GONORRHEA: NEGATIVE

## 2016-01-22 LAB — HIV ANTIBODY (ROUTINE TESTING W REFLEX): HIV SCREEN 4TH GENERATION: NONREACTIVE

## 2016-01-22 LAB — RPR: RPR: NONREACTIVE

## 2016-01-23 LAB — URINE CULTURE

## 2016-02-14 ENCOUNTER — Emergency Department (HOSPITAL_COMMUNITY): Payer: Medicare Other

## 2016-02-14 ENCOUNTER — Encounter (HOSPITAL_COMMUNITY): Payer: Self-pay | Admitting: Emergency Medicine

## 2016-02-14 ENCOUNTER — Emergency Department (HOSPITAL_COMMUNITY)
Admission: EM | Admit: 2016-02-14 | Discharge: 2016-02-14 | Disposition: A | Discharge status: Home / Self Care | Payer: Medicare Other | Attending: Emergency Medicine | Admitting: Emergency Medicine

## 2016-02-14 DIAGNOSIS — I10 Essential (primary) hypertension: Secondary | ICD-10-CM | POA: Diagnosis not present

## 2016-02-14 DIAGNOSIS — F17210 Nicotine dependence, cigarettes, uncomplicated: Secondary | ICD-10-CM | POA: Diagnosis not present

## 2016-02-14 DIAGNOSIS — Z8639 Personal history of other endocrine, nutritional and metabolic disease: Secondary | ICD-10-CM | POA: Diagnosis not present

## 2016-02-14 DIAGNOSIS — N898 Other specified noninflammatory disorders of vagina: Secondary | ICD-10-CM | POA: Diagnosis not present

## 2016-02-14 DIAGNOSIS — R51 Headache: Secondary | ICD-10-CM | POA: Diagnosis present

## 2016-02-14 DIAGNOSIS — Z87828 Personal history of other (healed) physical injury and trauma: Secondary | ICD-10-CM | POA: Diagnosis not present

## 2016-02-14 DIAGNOSIS — Z862 Personal history of diseases of the blood and blood-forming organs and certain disorders involving the immune mechanism: Secondary | ICD-10-CM | POA: Insufficient documentation

## 2016-02-14 DIAGNOSIS — J0110 Acute frontal sinusitis, unspecified: Secondary | ICD-10-CM | POA: Insufficient documentation

## 2016-02-14 DIAGNOSIS — Z86018 Personal history of other benign neoplasm: Secondary | ICD-10-CM | POA: Insufficient documentation

## 2016-02-14 LAB — COMPREHENSIVE METABOLIC PANEL
ALBUMIN: 3.9 g/dL (ref 3.5–5.0)
ALK PHOS: 61 U/L (ref 38–126)
ALT: 13 U/L — AB (ref 14–54)
AST: 20 U/L (ref 15–41)
Anion gap: 6 (ref 5–15)
BILIRUBIN TOTAL: 0.3 mg/dL (ref 0.3–1.2)
BUN: 12 mg/dL (ref 6–20)
CALCIUM: 8.8 mg/dL — AB (ref 8.9–10.3)
CO2: 23 mmol/L (ref 22–32)
Chloride: 109 mmol/L (ref 101–111)
Creatinine, Ser: 0.79 mg/dL (ref 0.44–1.00)
GFR calc Af Amer: >60 mL/min (ref >60)
GFR calc non Af Amer: >60 mL/min (ref >60)
GLUCOSE: 90 mg/dL (ref 65–99)
Potassium: 4.1 mmol/L (ref 3.5–5.1)
Sodium: 138 mmol/L (ref 135–145)
Total Protein: 7.5 g/dL (ref 6.5–8.1)

## 2016-02-14 LAB — CBC WITH DIFFERENTIAL/PLATELET
BASOS ABS: 0 10*3/uL (ref 0.0–0.1)
BASOS PCT: 0 %
Eosinophils Absolute: 0.3 10*3/uL (ref 0.0–0.7)
Eosinophils Relative: 4 %
HEMATOCRIT: 30.4 % — AB (ref 36.0–46.0)
Hemoglobin: 9.2 g/dL — ABNORMAL LOW (ref 12.0–15.0)
LYMPHS PCT: 20 %
Lymphs Abs: 1.1 10*3/uL (ref 0.7–4.0)
MCH: 23.8 pg — ABNORMAL LOW (ref 26.0–34.0)
MCHC: 30.3 g/dL (ref 30.0–36.0)
MCV: 78.8 fL (ref 78.0–100.0)
Monocytes Absolute: 0.4 10*3/uL (ref 0.1–1.0)
Monocytes Relative: 7 %
NEUTROS ABS: 3.9 10*3/uL (ref 1.7–7.7)
Neutrophils Relative %: 69 %
Platelets: 265 10*3/uL (ref 150–400)
RBC: 3.86 MIL/uL — AB (ref 3.87–5.11)
RDW: 20.3 % — ABNORMAL HIGH (ref 11.5–15.5)
WBC: 5.7 10*3/uL (ref 4.0–10.5)

## 2016-02-14 MED ORDER — AMOXICILLIN 500 MG PO CAPS: 500.0000 mg | ORAL_CAPSULE | Freq: Three times a day (TID) | ORAL | Status: DC

## 2016-02-14 MED ORDER — TERCONAZOLE 0.4 % VA CREA
1.0000 | TOPICAL_CREAM | Freq: Every day | VAGINAL | Status: DC
Start: 2016-02-14 — End: 2016-04-29

## 2016-02-14 MED ORDER — IBUPROFEN 800 MG PO TABS
800.0000 mg | ORAL_TABLET | Freq: Once | ORAL | Status: AC
  Administered 2016-02-14: 800 mg via ORAL
  Filled 2016-02-14: qty 1

## 2016-02-14 NOTE — ED Notes (Signed)
Due to Pt continuing to ask for pain medication, this Probation officer contacted MD and received a verbal order for Mortin 800mg  prior to d/c.

## 2016-02-14 NOTE — ED Notes (Signed)
Patient transported to CT 

## 2016-02-14 NOTE — Discharge Instructions (Signed)
Follow-up with your family doctor for ear sinusitis if it does not improve. Follow-up with [facility] clinic for your vaginal problems

## 2016-02-14 NOTE — ED Notes (Signed)
MD at bedside. 
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
## 2016-02-14 NOTE — ED Notes (Addendum)
Pt reports that she was previsously treated for a UTI and "had so much yeast that they had to put me on another medication."  Sts she was allergic to medication for yeast infection.

## 2016-02-14 NOTE — ED Notes (Addendum)
Verbalized understanding discharge instructions. In no acute distress.  Attempted to educate pt regarding medications.  Pt asking for a pain medication prescription.  Pt educated to take ibuprofen and tylenol.  Further, Pt informed that her pain was from the infection and the antibiotic is the best medication that she can take.

## 2016-02-14 NOTE — ED Notes (Signed)
Pt ambulated to the restroom w/o difficulty.

## 2016-02-14 NOTE — ED Notes (Addendum)
Pt agitated that she has not been given any medication for headache and that this RN has been unable to step into her room until this moment.  Pt educated that nothing has been ordered and this Probation officer apologized that I have been busy/tied up w/ other patients.  It is noted that nothing has been ordered since lab work and she did not asked MD for pain medication while he was in the room.  Pt currently denies needs.
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
## 2016-02-14 NOTE — ED Notes (Signed)
Pt calling out for pain medication.  Nothing currently ordered.

## 2016-03-07 ENCOUNTER — Ambulatory Visit: Payer: Self-pay | Admitting: Family Medicine

## 2016-04-07 ENCOUNTER — Ambulatory Visit: Payer: Self-pay | Admitting: Obstetrics and Gynecology

## 2016-04-29 ENCOUNTER — Encounter (HOSPITAL_COMMUNITY): Payer: Self-pay

## 2016-04-29 ENCOUNTER — Inpatient Hospital Stay (HOSPITAL_COMMUNITY)
Admission: AD | Admit: 2016-04-29 | Discharge: 2016-04-29 | Disposition: A | Discharge status: Home / Self Care | Payer: Medicare Other | Source: Ambulatory Visit | Attending: Obstetrics & Gynecology | Admitting: Obstetrics & Gynecology

## 2016-04-29 DIAGNOSIS — Z885 Allergy status to narcotic agent status: Secondary | ICD-10-CM | POA: Insufficient documentation

## 2016-04-29 DIAGNOSIS — I10 Essential (primary) hypertension: Secondary | ICD-10-CM | POA: Diagnosis not present

## 2016-04-29 DIAGNOSIS — F17210 Nicotine dependence, cigarettes, uncomplicated: Secondary | ICD-10-CM | POA: Insufficient documentation

## 2016-04-29 DIAGNOSIS — Z881 Allergy status to other antibiotic agents status: Secondary | ICD-10-CM | POA: Diagnosis not present

## 2016-04-29 DIAGNOSIS — N760 Acute vaginitis: Secondary | ICD-10-CM | POA: Insufficient documentation

## 2016-04-29 DIAGNOSIS — N898 Other specified noninflammatory disorders of vagina: Secondary | ICD-10-CM | POA: Diagnosis present

## 2016-04-29 LAB — URINALYSIS, ROUTINE W REFLEX MICROSCOPIC
Bilirubin Urine: NEGATIVE
GLUCOSE, UA: NEGATIVE mg/dL
KETONES UR: NEGATIVE mg/dL
Leukocytes, UA: NEGATIVE
Nitrite: NEGATIVE
PH: 5.5 (ref 5.0–8.0)
Protein, ur: NEGATIVE mg/dL
Specific Gravity, Urine: 1.02 (ref 1.005–1.030)

## 2016-04-29 LAB — WET PREP, GENITAL
CLUE CELLS WET PREP: NONE SEEN
Sperm: NONE SEEN
Trich, Wet Prep: NONE SEEN
Yeast Wet Prep HPF POC: NONE SEEN

## 2016-04-29 LAB — CBC
HCT: 30 % — ABNORMAL LOW (ref 36.0–46.0)
Hemoglobin: 9.2 g/dL — ABNORMAL LOW (ref 12.0–15.0)
MCH: 23.2 pg — AB (ref 26.0–34.0)
MCHC: 30.7 g/dL (ref 30.0–36.0)
MCV: 75.8 fL — AB (ref 78.0–100.0)
PLATELETS: 230 10*3/uL (ref 150–400)
RBC: 3.96 MIL/uL (ref 3.87–5.11)
RDW: 20.3 % — AB (ref 11.5–15.5)
WBC: 5.4 10*3/uL (ref 4.0–10.5)

## 2016-04-29 LAB — URINE MICROSCOPIC-ADD ON

## 2016-04-29 LAB — POCT PREGNANCY, URINE: Preg Test, Ur: NEGATIVE

## 2016-04-29 MED ORDER — CLINDAMYCIN HCL 300 MG PO CAPS: 300.0000 mg | ORAL_CAPSULE | Freq: Two times a day (BID) | ORAL | Status: DC

## 2016-04-29 MED ORDER — IBUPROFEN 600 MG PO TABS
600.0000 mg | ORAL_TABLET | Freq: Once | ORAL | Status: AC
  Administered 2016-04-29: 600 mg via ORAL
  Filled 2016-04-29: qty 1

## 2016-04-29 MED ORDER — TERCONAZOLE 0.8 % VA CREA: 1.0000 | TOPICAL_CREAM | Freq: Every day | VAGINAL | Status: DC

## 2016-04-29 NOTE — Discharge Instructions (Signed)
Vaginitis Vaginitis is an inflammation of the vagina. It is most often caused by a change in the normal balance of the bacteria and yeast that live in the vagina. This change in balance causes an overgrowth of certain bacteria or yeast, which causes the inflammation. There are different types of vaginitis, but the most common types are:  Bacterial vaginosis.  Yeast infection (candidiasis).  Trichomoniasis vaginitis. This is a sexually transmitted infection (STI).  Viral vaginitis.  Atrophic vaginitis.  Allergic vaginitis. CAUSES  The cause depends on the type of vaginitis. Vaginitis can be caused by:  Bacteria (bacterial vaginosis).  Yeast (yeast infection).  A parasite (trichomoniasis vaginitis)  A virus (viral vaginitis).  Low hormone levels (atrophic vaginitis). Low hormone levels can occur during pregnancy, breastfeeding, or after menopause.  Irritants, such as bubble baths, scented tampons, and feminine sprays (allergic vaginitis). Other factors can change the normal balance of the yeast and bacteria that live in the vagina. These include:  Antibiotic medicines.  Poor hygiene.  Diaphragms, vaginal sponges, spermicides, birth control pills, and intrauterine devices (IUD).  Sexual intercourse.  Infection.  Uncontrolled diabetes.  A weakened immune system. SYMPTOMS  Symptoms can vary depending on the cause of the vaginitis. Common symptoms include:  Abnormal vaginal discharge.  The discharge is white, gray, or yellow with bacterial vaginosis.  The discharge is thick, white, and cheesy with a yeast infection.  The discharge is frothy and yellow or greenish with trichomoniasis.  A bad vaginal odor.  The odor is fishy with bacterial vaginosis.  Vaginal itching, pain, or swelling.  Painful intercourse.  Pain or burning when urinating. Sometimes, there are no symptoms. TREATMENT  Treatment will vary depending on the type of infection.   Bacterial  vaginosis and trichomoniasis are often treated with antibiotic creams or pills.  Yeast infections are often treated with antifungal medicines, such as vaginal creams or suppositories.  Viral vaginitis has no cure, but symptoms can be treated with medicines that relieve discomfort. Your sexual partner should be treated as well.  Atrophic vaginitis may be treated with an estrogen cream, pill, suppository, or vaginal ring. If vaginal dryness occurs, lubricants and moisturizing creams may help. You may be told to avoid scented soaps, sprays, or douches.  Allergic vaginitis treatment involves quitting the use of the product that is causing the problem. Vaginal creams can be used to treat the symptoms. HOME CARE INSTRUCTIONS   Take all medicines as directed by your caregiver.  Keep your genital area clean and dry. Avoid soap and only rinse the area with water.  Avoid douching. It can remove the healthy bacteria in the vagina.  Do not use tampons or have sexual intercourse until your vaginitis has been treated. Use sanitary pads while you have vaginitis.  Wipe from front to back. This avoids the spread of bacteria from the rectum to the vagina.  Let air reach your genital area.  Wear cotton underwear to decrease moisture buildup.  Avoid wearing underwear while you sleep until your vaginitis is gone.  Avoid tight pants and underwear or nylons without a cotton panel.  Take off wet clothing (especially bathing suits) as soon as possible.  Use mild, non-scented products. Avoid using irritants, such as:  Scented feminine sprays.  Fabric softeners.  Scented detergents.  Scented tampons.  Scented soaps or bubble baths.  Practice safe sex and use condoms. Condoms may prevent the spread of trichomoniasis and viral vaginitis. SEEK MEDICAL CARE IF:   You have abdominal pain.  You  have a fever or persistent symptoms for more than 2-3 days.  You have a fever and your symptoms suddenly  get worse.   This information is not intended to replace advice given to you by your health care provider. Make sure you discuss any questions you have with your health care provider.   Document Released: 10/05/2007 Document Revised: 04/24/2015 Document Reviewed: 05/20/2012 Elsevier Interactive Patient Education 2016 [place] is the time when your body begins to move into the menopause (no menstrual period for 12 straight months). It is a natural process. Perimenopause can begin 2-8 years before the menopause and usually lasts for 1 year after the menopause. During this time, your ovaries may or may not produce an egg. The ovaries vary in their production of estrogen and progesterone hormones each month. This can cause irregular menstrual periods, difficulty getting pregnant, vaginal bleeding between periods, and uncomfortable symptoms. CAUSES  Irregular production of the ovarian hormones, estrogen and progesterone, and not ovulating every month.  Other causes include:  Tumor of the pituitary gland in the brain.  Medical disease that affects the ovaries.  Radiation treatment.  Chemotherapy.  Unknown causes.  Heavy smoking and excessive alcohol intake can bring on perimenopause sooner. SIGNS AND SYMPTOMS   Hot flashes.  Night sweats.  Irregular menstrual periods.  Decreased sex drive.  Vaginal dryness.  Headaches.  Mood swings.  Depression.  Memory problems.  Irritability.  Tiredness.  Weight gain.  Trouble getting pregnant.  The beginning of losing bone cells (osteoporosis).  The beginning of hardening of the arteries (atherosclerosis). DIAGNOSIS  Your health care provider will make a diagnosis by analyzing your age, menstrual history, and symptoms. He or she will do a physical exam and note any changes in your body, especially your female organs. Female hormone tests may or may not be helpful depending on the amount  of female hormones you produce and when you produce them. However, other hormone tests may be helpful to rule out other problems. TREATMENT  In some cases, no treatment is needed. The decision on whether treatment is necessary during the perimenopause should be made by you and your health care provider based on how the symptoms are affecting you and your lifestyle. Various treatments are available, such as:  Treating individual symptoms with a specific medicine for that symptom.  Herbal medicines that can help specific symptoms.  Counseling.  Group therapy. HOME CARE INSTRUCTIONS   Keep track of your menstrual periods (when they occur, how heavy they are, how long between periods, and how long they last) as well as your symptoms and when they started.  Only take over-the-counter or prescription medicines as directed by your health care provider.  Sleep and rest.  Exercise.  Eat a diet that contains calcium (good for your bones) and soy (acts like the estrogen hormone).  Do not smoke.  Avoid alcoholic beverages.  Take vitamin supplements as recommended by your health care provider. Taking vitamin E may help in certain cases.  Take calcium and vitamin D supplements to help prevent bone loss.  Group therapy is sometimes helpful.  Acupuncture may help in some cases. SEEK MEDICAL CARE IF:   You have questions about any symptoms you are having.  You need a referral to a specialist (gynecologist, psychiatrist, or psychologist). SEEK IMMEDIATE MEDICAL CARE IF:   You have vaginal bleeding.  Your period lasts longer than 8 days.  Your periods are recurring sooner than 21 days.  You have bleeding after intercourse.  You have severe depression.  You have pain when you urinate.  You have severe headaches.  You have vision problems.   This information is not intended to replace advice given to you by your health care provider. Make sure you discuss any questions you have  with your health care provider.   Document Released: 01/15/2005 Document Revised: 12/29/2014 Document Reviewed: 07/07/2013 Elsevier Interactive Patient Education [employer].

## 2016-04-29 NOTE — MAU Provider Note (Signed)
History     CSN: [id number]  Arrival date and time: 04/29/16 1311   First Provider Initiated Contact with Patient 04/29/16 1522       Chief Complaint  Patient presents with  . Vaginal Discharge  . Abdominal Pain   HPI  [name] is a 50 y.o. female who presents with abdominal cramping & vaginal discharge.  Symptoms began 1 week ago. Reports intermittent lower abdominal pain. Describes as dull, achy, & cramp like. Rates 5/10. Has not treated. Also note increase in vaginal discharge. Yellow milky discharge with slight odor and vaginal irritation. Denies vaginal bleeding. LMP 2 months ago.  Dysuria x 2 days. Denies urinary frequency, hematuria, flank pain, or fever.  Denies n/v/d, constipation.  Is sexually active & would like STD screening today.   OB History    Gravida Para Term Preterm AB TAB SAB Ectopic Multiple Living   4 2 2  0 2 1 1  0  2      Past Medical History  Diagnosis Date  . Hypertension   . Anemia   . Fibroids   . Thyroid goiter   . Knee joint injury   . Hypertension     no current meds for htn    Past Surgical History  Procedure Laterality Date  . Cesarean section    . Hand surgery    . Ankle surgery Right 2009    broken ankle   . Tubal ligation    . Replacement total knee Right 2012  . Arthroscopic surgery to knee Bilateral   . Total knee arthroplasty Left 01/03/2014    Procedure: LEFT TOTAL KNEE ARTHROPLASTY;  Surgeon: Tobi Bastos, MD;  Location: [facility];  Service: Orthopedics;  Laterality: Left;    Family History  Problem Relation Age of Onset  . Hypertension Mother   . Hypertension Father   . Cancer Maternal Grandmother     ? lung    Social History  Substance Use Topics  . Smoking status: Current Every Day Smoker -- 0.20 packs/day for 0 years    Types: Cigarettes  . Smokeless tobacco: Never Used  . Alcohol Use: 1.0 oz/week    2 Standard drinks or equivalent per week     Comment: occasionally once per month    Allergies:   Allergies  Allergen Reactions  . Tramadol Itching  . Metronidazole Itching and Nausea And Vomiting    Prescriptions prior to admission  Medication Sig Dispense Refill Last Dose  . amoxicillin (AMOXIL) 500 MG capsule Take 1 capsule (500 mg total) by mouth 3 (three) times daily. 30 capsule 0   . cephALEXin (KEFLEX) 500 MG capsule Take 1 capsule (500 mg total) by mouth 2 (two) times daily. (Patient not taking: Reported on 02/14/2016) 14 capsule 0 Not Taking at Unknown time  . ferrous sulfate 325 (65 FE) MG tablet Take 1 tablet (325 mg total) by mouth 2 (two) times daily with a meal. (Patient not taking: Reported on 02/14/2016) 14 tablet 0 Not Taking at Unknown time  . HYDROcodone-acetaminophen (NORCO/VICODIN) 5-325 MG tablet Take 1-2 tablets by mouth every 6 (six) hours as needed. (Patient not taking: Reported on 02/14/2016) 8 tablet 0 Not Taking at Unknown time  . ibuprofen (ADVIL,MOTRIN) 200 MG tablet Take 200 mg by mouth every 6 (six) hours as needed for headache or moderate pain.   Past Week at Unknown time  . metroNIDAZOLE (FLAGYL) 500 MG tablet Take 1 tablet (500 mg total) by mouth 3 (three) times daily. (Patient  not taking: Reported on 01/21/2016) 30 tablet 0 Not Taking at Unknown time  . terconazole (TERAZOL 7) 0.4 % vaginal cream Place 1 applicator vaginally at bedtime. 45 g 0     Review of Systems  Constitutional: Negative.   Gastrointestinal: Positive for abdominal pain. Negative for nausea, vomiting, diarrhea and constipation.  Genitourinary: Positive for dysuria. Negative for urgency, frequency and hematuria.       + vaginal discharge No vaginal bleeding   Physical Exam   Blood pressure 150/91, pulse 71, temperature 98.1 F (36.7 C), resp. rate 18, height 5\' 6"  (1.676 m), weight 221 lb 9.6 oz (100.517 kg), last menstrual period 02/08/2016.  Physical Exam  Nursing note and vitals reviewed. Constitutional: She is oriented to person, place, and time. She appears well-developed and  well-nourished. No distress.  HENT:  Head: Normocephalic and atraumatic.  Eyes: Conjunctivae are normal. Right eye exhibits no discharge. Left eye exhibits no discharge. No scleral icterus.  Neck: Normal range of motion.  Cardiovascular: Normal rate, regular rhythm and normal heart sounds.   No murmur heard. Respiratory: Effort normal and breath sounds normal. No respiratory distress. She has no wheezes.  GI: Soft. Bowel sounds are normal. She exhibits no distension. There is tenderness in the suprapubic area. There is no rigidity, no rebound and no guarding.  Genitourinary: Uterus is tender. Uterus is not enlarged. Cervix exhibits no motion tenderness and no friability. There is erythema in the vagina. Vaginal discharge (small amount of thin cloudy discharge as well as small amount of white discharge adherant to bilateral labia) found.  Neurological: She is alert and oriented to person, place, and time.  Skin: Skin is warm and dry. She is not diaphoretic.  Psychiatric: She has a normal mood and affect. Her behavior is normal. Judgment and thought content normal.    MAU Course  Procedures Results for orders placed or performed during the hospital encounter of 04/29/16 (from the past 24 hour(s))  Urinalysis, Routine w reflex microscopic (not at [facility])     Status: Abnormal   Collection Time: 04/29/16  1:35 PM  Result Value Ref Range   Color, Urine YELLOW YELLOW   APPearance CLEAR CLEAR   Specific Gravity, Urine 1.020 1.005 - 1.030   pH 5.5 5.0 - 8.0   Glucose, UA NEGATIVE NEGATIVE mg/dL   Hgb urine dipstick TRACE (A) NEGATIVE   Bilirubin Urine NEGATIVE NEGATIVE   Ketones, ur NEGATIVE NEGATIVE mg/dL   Protein, ur NEGATIVE NEGATIVE mg/dL   Nitrite NEGATIVE NEGATIVE   Leukocytes, UA NEGATIVE NEGATIVE  Urine microscopic-add on     Status: Abnormal   Collection Time: 04/29/16  1:35 PM  Result Value Ref Range   Squamous Epithelial / LPF 0-5 (A) NONE SEEN   WBC, UA 0-5 0 - 5 WBC/hpf   RBC  / HPF 0-5 0 - 5 RBC/hpf   Bacteria, UA RARE (A) NONE SEEN  Pregnancy, urine POC     Status: None   Collection Time: 04/29/16  2:18 PM  Result Value Ref Range   Preg Test, Ur NEGATIVE NEGATIVE  Wet prep, genital     Status: Abnormal   Collection Time: 04/29/16  3:39 PM  Result Value Ref Range   Yeast Wet Prep HPF POC NONE SEEN NONE SEEN   Trich, Wet Prep NONE SEEN NONE SEEN   Clue Cells Wet Prep HPF POC NONE SEEN NONE SEEN   WBC, Wet Prep HPF POC FEW (A) NONE SEEN   Sperm NONE SEEN  CBC     Status: Abnormal   Collection Time: 04/29/16  4:06 PM  Result Value Ref Range   WBC 5.4 4.0 - 10.5 K/uL   RBC 3.96 3.87 - 5.11 MIL/uL   Hemoglobin 9.2 (L) 12.0 - 15.0 g/dL   HCT 30.0 (L) 36.0 - 46.0 %   MCV 75.8 (L) 78.0 - 100.0 fL   MCH 23.2 (L) 26.0 - 34.0 pg   MCHC 30.7 30.0 - 36.0 g/dL   RDW 20.3 (H) 11.5 - 15.5 %   Platelets 230 150 - 400 K/uL    MDM UPT negative Ibuprofen 600 mg po Will treat based on patient's complaint & assessment Pt states she is ready to go home Will schedule f/u with [facility] to discuss menopause symptoms & possible HRT Urine culture sent for pt's symptoms  Assessment and Plan  A: 1. Vaginitis and vulvovaginitis     P; Discharge home Rx terazol & clindamycin Schedule appt with [facility] as needed for menopause symptoms GC/CT, HIV, RPR pending Urine culture pending  Jorje Guild 04/29/2016, 3:22 PM

## 2016-04-29 NOTE — MAU Note (Signed)
LMP 2 months ago. Having abd pain for a week. Pain in lower abd pain. Yellow vag discharge with no odor. Having night sweats.

## 2016-04-30 LAB — HIV ANTIBODY (ROUTINE TESTING W REFLEX): HIV SCREEN 4TH GENERATION: NONREACTIVE

## 2016-04-30 LAB — GC/CHLAMYDIA PROBE AMP (~~LOC~~) NOT AT ARMC
Chlamydia: NEGATIVE
NEISSERIA GONORRHEA: NEGATIVE

## 2016-04-30 LAB — RPR: RPR Ser Ql: NONREACTIVE

## 2016-05-01 LAB — URINE CULTURE

## 2016-08-06 ENCOUNTER — Ambulatory Visit: Payer: Self-pay | Admitting: Internal Medicine

## 2016-08-18 ENCOUNTER — Ambulatory Visit: Payer: Self-pay | Admitting: Internal Medicine

## 2016-08-18 DIAGNOSIS — Z0289 Encounter for other administrative examinations: Secondary | ICD-10-CM

## 2016-09-22 ENCOUNTER — Telehealth: Payer: Self-pay | Admitting: Internal Medicine

## 2016-09-22 DIAGNOSIS — M542 Cervicalgia: Secondary | ICD-10-CM | POA: Diagnosis not present

## 2016-09-22 DIAGNOSIS — E049 Nontoxic goiter, unspecified: Secondary | ICD-10-CM | POA: Diagnosis not present

## 2016-09-22 DIAGNOSIS — M26623 Arthralgia of bilateral temporomandibular joint: Secondary | ICD-10-CM | POA: Diagnosis not present

## 2016-09-22 NOTE — Telephone Encounter (Signed)
APT. REMINDER CALL, NO ANSWER °

## 2016-09-23 ENCOUNTER — Other Ambulatory Visit: Payer: Self-pay | Admitting: Otolaryngology

## 2016-09-23 ENCOUNTER — Ambulatory Visit (INDEPENDENT_AMBULATORY_CARE_PROVIDER_SITE_OTHER): Payer: Medicare Other | Admitting: Internal Medicine

## 2016-09-23 VITALS — BP 142/86 | HR 78 | Temp 97.8°F | Ht 66.0 in | Wt 220.0 lb

## 2016-09-23 DIAGNOSIS — M170 Bilateral primary osteoarthritis of knee: Secondary | ICD-10-CM

## 2016-09-23 DIAGNOSIS — Z111 Encounter for screening for respiratory tuberculosis: Secondary | ICD-10-CM

## 2016-09-23 DIAGNOSIS — F17210 Nicotine dependence, cigarettes, uncomplicated: Secondary | ICD-10-CM | POA: Diagnosis not present

## 2016-09-23 DIAGNOSIS — Z96653 Presence of artificial knee joint, bilateral: Secondary | ICD-10-CM | POA: Diagnosis not present

## 2016-09-23 DIAGNOSIS — E042 Nontoxic multinodular goiter: Secondary | ICD-10-CM

## 2016-09-23 DIAGNOSIS — E0520 Thyrotoxicosis with toxic multinodular goiter without thyrotoxic crisis or storm: Secondary | ICD-10-CM

## 2016-09-23 DIAGNOSIS — I10 Essential (primary) hypertension: Secondary | ICD-10-CM

## 2016-09-23 DIAGNOSIS — R232 Flushing: Secondary | ICD-10-CM | POA: Insufficient documentation

## 2016-09-23 DIAGNOSIS — F17200 Nicotine dependence, unspecified, uncomplicated: Secondary | ICD-10-CM

## 2016-09-23 DIAGNOSIS — N951 Menopausal and female climacteric states: Secondary | ICD-10-CM

## 2016-09-23 DIAGNOSIS — Z008 Encounter for other general examination: Secondary | ICD-10-CM | POA: Insufficient documentation

## 2016-09-23 DIAGNOSIS — E049 Nontoxic goiter, unspecified: Secondary | ICD-10-CM

## 2016-09-23 DIAGNOSIS — Z0000 Encounter for general adult medical examination without abnormal findings: Secondary | ICD-10-CM | POA: Insufficient documentation

## 2016-09-23 NOTE — Assessment & Plan Note (Signed)
Follows with Dr. Erik Obey of ENT. Biopsy in January 2016 was an insufficient sample. She is scheduled for repeat U/S and possible drainage or biopsy pending results. -f/u mgmt per ENT

## 2016-09-23 NOTE — Assessment & Plan Note (Signed)
Patient not currently on any medications. She reports taking atenolol in the past. BP today is 142/86.  Recommend lifestyle modification at this time with diet/exercise. DASH diet information provided.

## 2016-09-23 NOTE — Patient Instructions (Addendum)
It was a pleasure to meet you [name].  I have filled out your work form and we are administering the TB skin test (PPD). Please come back in 48-72 hours to have this checked and obtain the results.  Please work on diet and exercise to help lower your blood pressure. Please also try your best to continue to cut back on your smoking.  You may try tylenol for your joint and neck pain in addition to Aleve or Ibuprofen. Limit tylenol to less than 4000 mg per day.  Please follow up with [place] in 3 months to monitor your blood pressure. Please call us if you would like to have the flu shot.   DASH Eating Plan DASH stands for "Dietary Approaches to Stop Hypertension." The DASH eating plan is a healthy eating plan that has been shown to reduce high blood pressure (hypertension). Additional health benefits may include reducing the risk of type 2 diabetes mellitus, heart disease, and stroke. The DASH eating plan may also help with weight loss. WHAT DO I NEED TO KNOW ABOUT THE DASH EATING PLAN? For the DASH eating plan, you will follow these general guidelines:  Choose foods with a percent daily value for sodium of less than 5% (as listed on the food label).  Use salt-free seasonings or herbs instead of table salt or sea salt.  Check with your health care provider or pharmacist before using salt substitutes.  Eat lower-sodium products, often labeled as "lower sodium" or "no salt added."  Eat fresh foods.  Eat more vegetables, fruits, and low-fat dairy products.  Choose whole grains. Look for the word "whole" as the first word in the ingredient list.  Choose fish and skinless chicken or [place] more often than red meat. Limit fish, poultry, and meat to 6 oz (170 g) each day.  Limit sweets, desserts, sugars, and sugary drinks.  Choose heart-healthy fats.  Limit cheese to 1 oz (28 g) per day.  Eat more home-cooked food and less restaurant, buffet, and fast food.  Limit fried foods.  Cook  foods using methods other than frying.  Limit canned vegetables. If you do use them, rinse them well to decrease the sodium.  When eating at a restaurant, ask that your food be prepared with less salt, or no salt if possible. WHAT FOODS CAN I EAT? Seek help from a dietitian for individual calorie needs. Grains Whole grain or whole wheat bread. Brown rice. Whole grain or whole wheat pasta. Quinoa, bulgur, and whole grain cereals. Low-sodium cereals. Corn or whole wheat flour tortillas. Whole grain cornbread. Whole grain crackers. Low-sodium crackers. Vegetables Fresh or frozen vegetables (raw, steamed, roasted, or grilled). Low-sodium or reduced-sodium tomato and vegetable juices. Low-sodium or reduced-sodium tomato sauce and paste. Low-sodium or reduced-sodium canned vegetables.  Fruits All fresh, canned (in natural juice), or frozen fruits. Meat and Other Protein Products Ground beef (85% or leaner), grass-fed beef, or beef trimmed of fat. Skinless chicken or [place]. Ground chicken or [place]. Pork trimmed of fat. All fish and seafood. Eggs. Dried beans, peas, or lentils. Unsalted nuts and seeds. Unsalted canned beans. Dairy Low-fat dairy products, such as skim or 1% milk, 2% or reduced-fat cheeses, low-fat ricotta or cottage cheese, or plain low-fat yogurt. Low-sodium or reduced-sodium cheeses. Fats and Oils Tub margarines without trans fats. Light or reduced-fat mayonnaise and salad dressings (reduced sodium). Avocado. Safflower, olive, or canola oils. Natural peanut or almond butter. Other Unsalted popcorn and pretzels. The items listed above may not be  a complete list of recommended foods or beverages. Contact your dietitian for more options. WHAT FOODS ARE NOT RECOMMENDED? Grains White bread. White pasta. White rice. Refined cornbread. Bagels and croissants. Crackers that contain trans fat. Vegetables Creamed or fried vegetables. Vegetables in a cheese sauce. Regular canned vegetables.  Regular canned tomato sauce and paste. Regular tomato and vegetable juices. Fruits Dried fruits. Canned fruit in light or heavy syrup. Fruit juice. Meat and Other Protein Products Fatty cuts of meat. Ribs, chicken wings, bacon, sausage, bologna, salami, chitterlings, fatback, hot dogs, bratwurst, and packaged luncheon meats. Salted nuts and seeds. Canned beans with salt. Dairy Whole or 2% milk, cream, half-and-half, and cream cheese. Whole-fat or sweetened yogurt. Full-fat cheeses or blue cheese. Nondairy creamers and whipped toppings. Processed cheese, cheese spreads, or cheese curds. Condiments Onion and garlic salt, seasoned salt, table salt, and sea salt. Canned and packaged gravies. Worcestershire sauce. Tartar sauce. Barbecue sauce. Teriyaki sauce. Soy sauce, including reduced sodium. Steak sauce. Fish sauce. Oyster sauce. Cocktail sauce. Horseradish. Ketchup and mustard. Meat flavorings and tenderizers. Bouillon cubes. Hot sauce. Tabasco sauce. Marinades. Taco seasonings. Relishes. Fats and Oils Butter, stick margarine, lard, shortening, ghee, and bacon fat. Coconut, palm kernel, or palm oils. Regular salad dressings. Other Pickles and olives. Salted popcorn and pretzels. The items listed above may not be a complete list of foods and beverages to avoid. Contact your dietitian for more information. WHERE CAN I FIND MORE INFORMATION? National Heart, Lung, and Blood Institute: travelstabloid.com   This information is not intended to replace advice given to you by your health care provider. Make sure you discuss any questions you have with your health care provider.   Document Released: 11/27/2011 Document Revised: 12/29/2014 Document Reviewed: 10/12/2013 Elsevier Interactive Patient Education [employer].

## 2016-09-23 NOTE — Assessment & Plan Note (Signed)
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 She is also requesting a TB screen required by her work.  Work form completed today. PPD test administered today. She will follow up in 48-72 hours for recheck and to obtain her results for work.

## 2016-09-23 NOTE — Assessment & Plan Note (Signed)
Patient is a current smoker of about 20 years. Used to smoke 0.5 PPD but has cut down to 3-5 cigarettes a day now. She feels confident in her ability to cut back further without NRT.  Patient applauded on cutting back. She will continue to try to decrease tobacco use to complete cessation. She wants to try quitting without NRT at this time.

## 2016-09-23 NOTE — Assessment & Plan Note (Signed)
Patient reports recent hot flashes occurring at night and occasionally during the day. Last menstrual period was 9-10 months ago.  Likely menopausal symptoms. Recommended conservative management for now with keeping house cool, using ceiling fan, less clothing layers. Can consider SSRI or gabapentin if persistent intolerable symptoms.

## 2016-09-23 NOTE — Assessment & Plan Note (Signed)
S/p bilateral knee replacements for OA. Pain is tolerable and controlled with Aleve which she takes occasionally for headaches. She has some neck pain as well with occasional dull arm pain once or twice a month.  Cervicalgia possibly related to OA as well. Also with TMJ pain with palpation while opening/closing jaw. She does find relief with Aleve. Have advised patient that she may also try Tylenol as needed for pain, limiting to <4000 mg daily.

## 2016-09-23 NOTE — Progress Notes (Signed)
   CC: HTN  HPI:  [name] is a 50 y.o. female with PMH of HTN, toxic multinodular goiter, Osteoarthritis s/p bilateral knee replacements, tobacco use, and anemia who presents for management of her HTN, re-establishment of care and for completion of work form and TB screen.  HTN: Patient not currently on any medications. She reports taking atenolol in the past. BP today is 142/86.  Multinodular Goiter: Follows with Dr. Erik Obey of ENT. Biopsy in January 2016 was an insufficient sample. She is scheduled for repeat U/S and possible drainage or biopsy pending results.  Osteoarthritis: S/p bilateral knee replacements. Pain is tolerable and controlled with Aleve which she takes occasionally for headaches. She has some neck pain as well with occasional dull arm pain once or twice a month.  Hot flashes: Patient reports recent hot flashes occurring at night and occasionally during the day. Last menstrual period was 9-10 months ago.  Tobacco use: Patient is a current smoker of about 20 years. Used to smoke 0.5 PPD but has cut down to 3-5 cigarettes a day now. She feels confident in her ability to cut back further without NRT.  Healthcare maintenance: Patient declines flu shot today.  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 She is also requesting a TB screen required by her work.   Past Medical History:  Diagnosis Date  . Anemia   . Fibroids   . Hypertension   . Hypertension    no current meds for htn  . Knee joint injury   . Thyroid goiter     Review of Systems:   Review of Systems  Constitutional: Positive for diaphoresis. Negative for chills and fever.       Hot flashes  HENT:       No dysphagia  Respiratory: Negative for shortness of breath.   Cardiovascular: Negative for chest pain and leg swelling.  Gastrointestinal: Negative for abdominal pain, nausea and vomiting.  Neurological:       Neck pain with occasional dull arm pain      Physical Exam:  Vitals:   09/23/16 1337  BP: (!) 142/86  Pulse: 78  Temp: 97.8 F (36.6 C)  TempSrc: Oral  SpO2: 100%  Weight: 220 lb (99.8 kg)  Height: 5\' 6"  (1.676 m)   Physical Exam  Constitutional: She is oriented to person, place, and time. She appears well-developed and well-nourished. No distress.  HENT:  Head: Normocephalic and atraumatic.  TMJ tenderness with opening/closing of mandible during palpation  Cardiovascular: Normal rate and regular rhythm.   Pulmonary/Chest: Effort normal. No respiratory distress. She has no wheezes. She has no rales.  Musculoskeletal: She exhibits no edema.  Neurological: She is alert and oriented to person, place, and time.    Assessment & Plan:   See Encounters Tab for problem based charting.  Patient discussed with Dr. Lynnae January

## 2016-09-23 NOTE — Assessment & Plan Note (Signed)
Patient declines flu shot today.

## 2016-09-24 NOTE — Progress Notes (Signed)
Internal Medicine Clinic Attending  Case discussed with Dr. Patel,Vishal at the time of the visit.  We reviewed the resident's history and exam and pertinent patient test results.  I agree with the assessment, diagnosis, and plan of care documented in the resident's note.  

## 2016-10-02 ENCOUNTER — Ambulatory Visit
Admission: RE | Admit: 2016-10-02 | Discharge: 2016-10-02 | Disposition: A | Discharge status: Home / Self Care | Payer: Medicare Other | Source: Ambulatory Visit | Attending: Otolaryngology | Admitting: Otolaryngology

## 2016-10-02 DIAGNOSIS — E049 Nontoxic goiter, unspecified: Secondary | ICD-10-CM

## 2016-10-02 DIAGNOSIS — E042 Nontoxic multinodular goiter: Secondary | ICD-10-CM | POA: Diagnosis not present

## 2016-10-14 DIAGNOSIS — M1812 Unilateral primary osteoarthritis of first carpometacarpal joint, left hand: Secondary | ICD-10-CM | POA: Diagnosis not present

## 2016-10-14 DIAGNOSIS — M79645 Pain in left finger(s): Secondary | ICD-10-CM | POA: Diagnosis not present

## 2016-10-16 IMAGING — US US THYROID BIOPSY
1 series · 13 of 14 positions shown · non-contrast
Comparison: None.

CLINICAL DATA: Bilateral dominant thyroid nodules.

EXAM:
ULTRASOUND GUIDED NEEDLE ASPIRATE BIOPSY OF THE THYROID GLAND

[Series 1: us thyroid biopsy · 0.07mm/px · 14 acquisitions, 13 frames shown]
[im 1/14]
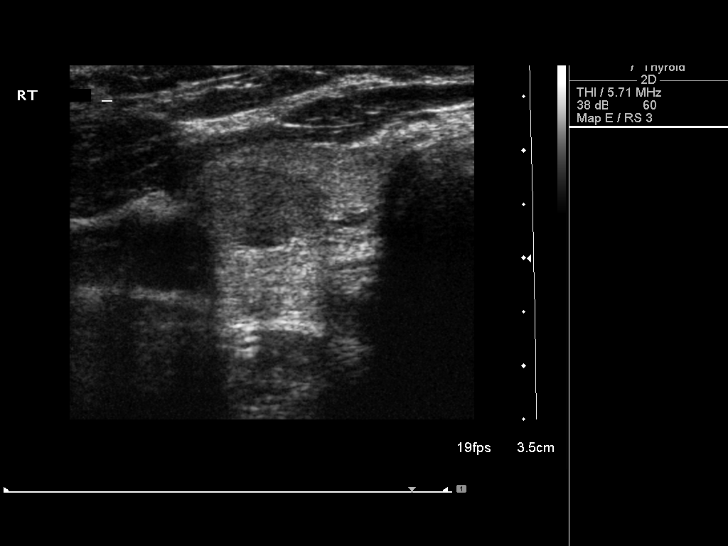
[im 2/14]
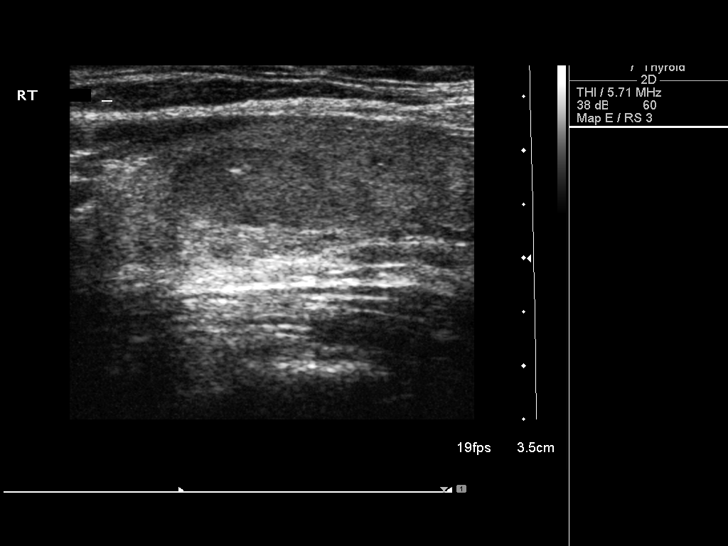
[im 3/14]
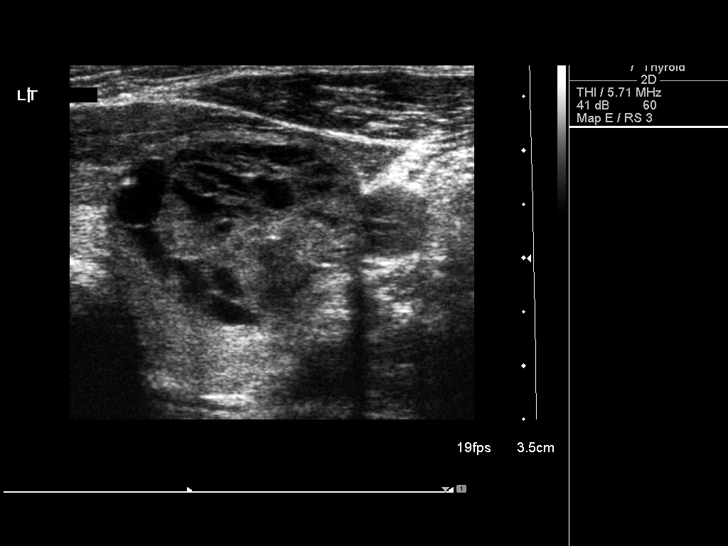
[im 4/14]
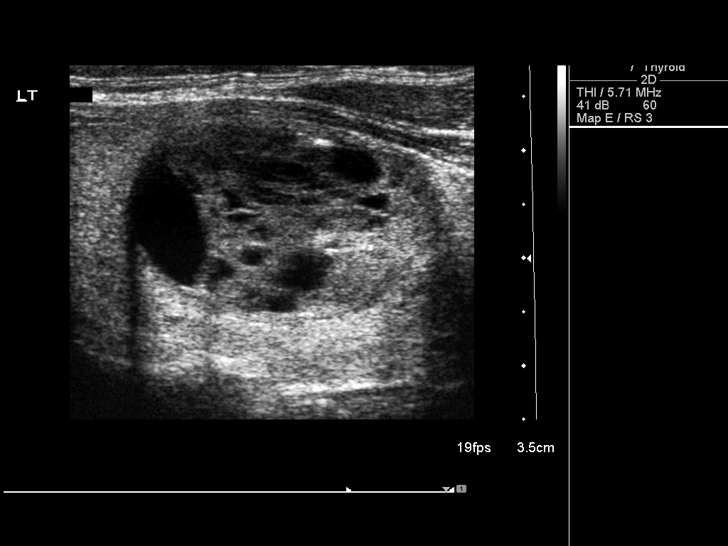
[im 5/14]
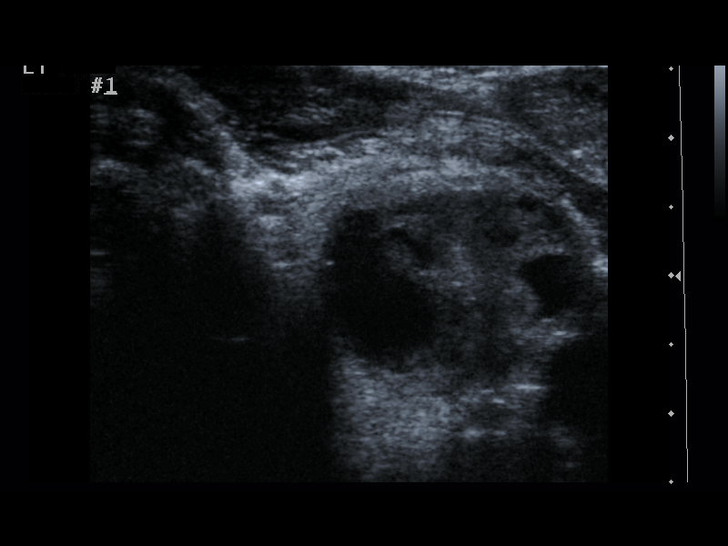
[im 6/14]
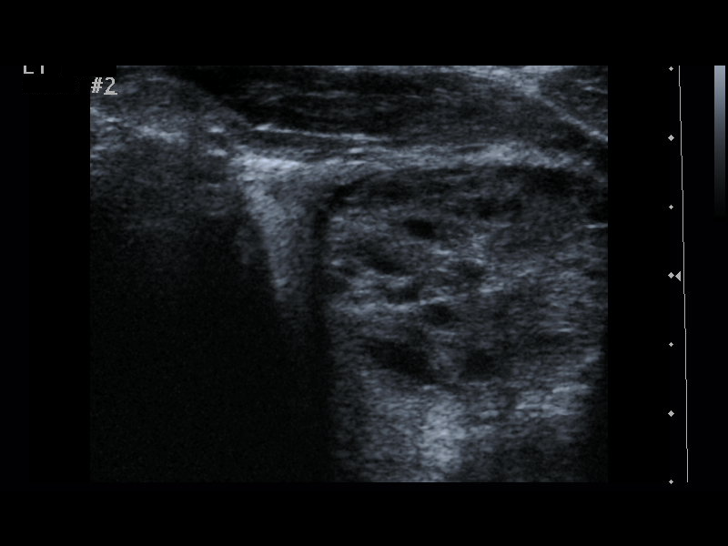
[im 8/14]
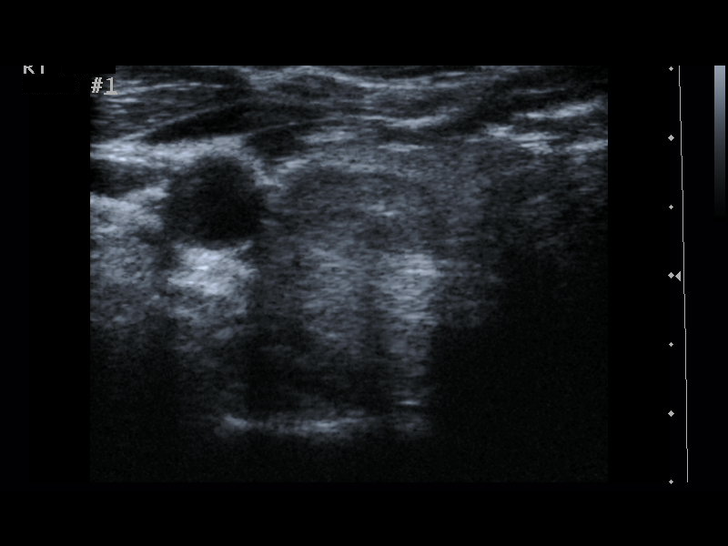
[im 9/14]
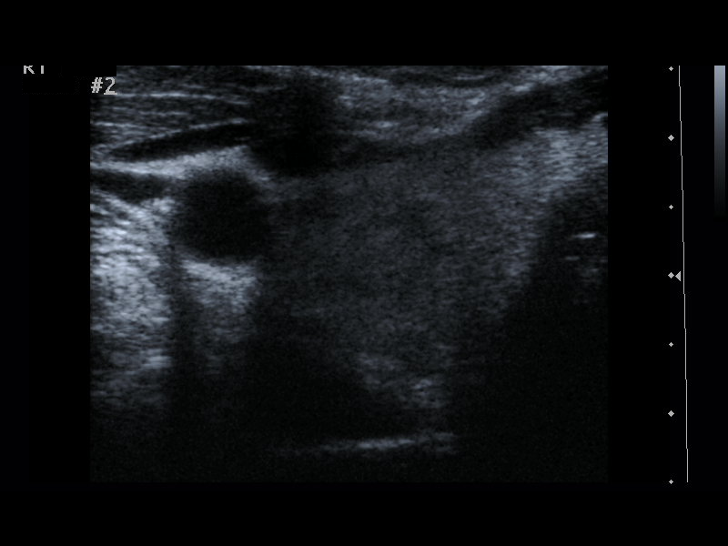
[im 10/14]
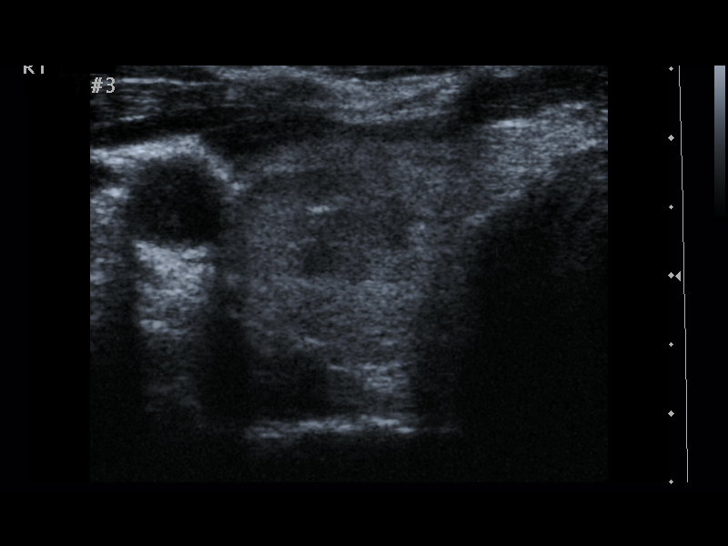
[im 11/14]
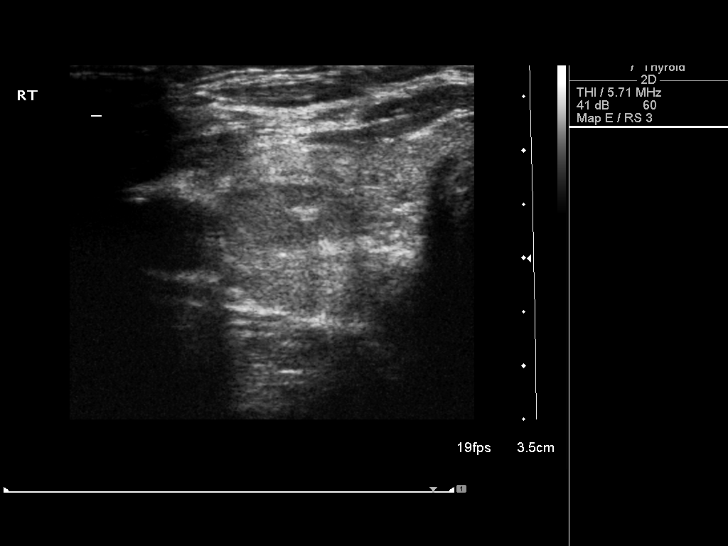
[im 12/14]
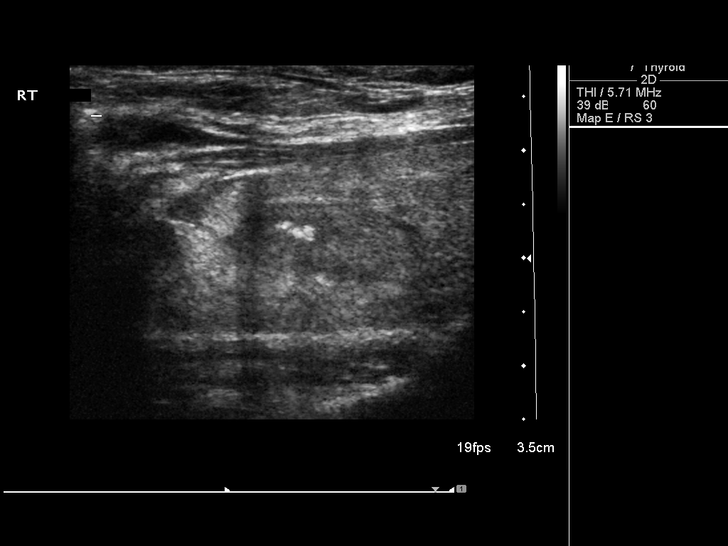
[im 13/14]
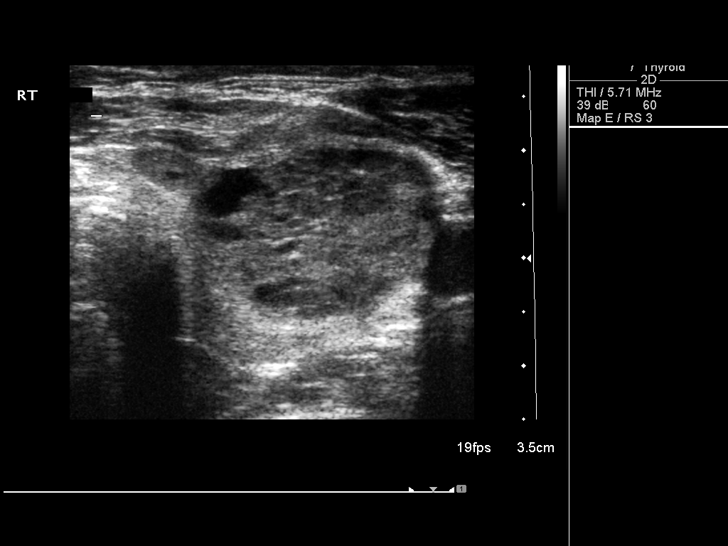
[im 14/14]
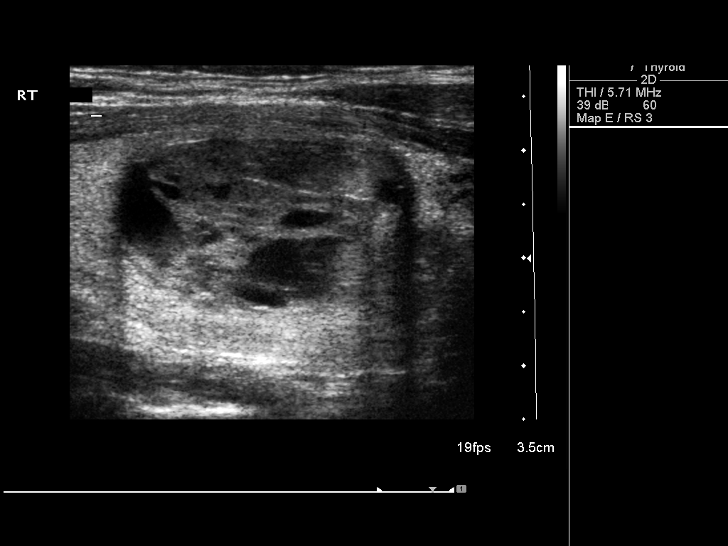

[13 of 14 positions shown; findings below may reference images not displayed]

PROCEDURE:
Thyroid biopsy was thoroughly discussed with the patient and
questions were answered. The benefits, risks, alternatives, and
complications were also discussed. The patient understands and
wishes to proceed with the procedure. Written consent was obtained.

Ultrasound was performed to localize and mark an adequate site for
the biopsy. The patient was then prepped and draped in a normal
sterile fashion. Local anesthesia was provided with 1% lidocaine.
Using direct ultrasound guidance, 3 passes were made using needles
into the nodule within the right lobe of the thyroid. Ultrasound was
used to confirm needle placements on all occasions. Subsequently,
using direct ultrasound guidance, 3 passes were made using needles
into the nodule within the left lobe of the thyroid. Ultrasound was
used to confirm needle placements on all occasions.Specimens were
sent to Pathology for analysis.

COMPLICATIONS:
None.
FINDINGS: Images document needle placement in the bilateral dominant thyroid
nodules.
IMPRESSION: Ultrasound guided needle aspirate biopsy performed of the bilateral
thyroid nodules.

## 2016-12-29 DIAGNOSIS — M79604 Pain in right leg: Secondary | ICD-10-CM | POA: Diagnosis not present

## 2016-12-29 DIAGNOSIS — M79605 Pain in left leg: Secondary | ICD-10-CM | POA: Diagnosis not present

## 2016-12-29 DIAGNOSIS — M5442 Lumbago with sciatica, left side: Secondary | ICD-10-CM | POA: Diagnosis not present

## 2017-01-05 DIAGNOSIS — M5442 Lumbago with sciatica, left side: Secondary | ICD-10-CM | POA: Diagnosis not present

## 2017-01-15 DIAGNOSIS — M1812 Unilateral primary osteoarthritis of first carpometacarpal joint, left hand: Secondary | ICD-10-CM | POA: Diagnosis not present

## 2017-02-02 DIAGNOSIS — M1812 Unilateral primary osteoarthritis of first carpometacarpal joint, left hand: Secondary | ICD-10-CM | POA: Diagnosis not present

## 2017-02-02 DIAGNOSIS — G8918 Other acute postprocedural pain: Secondary | ICD-10-CM | POA: Diagnosis not present

## 2017-02-02 DIAGNOSIS — M182 Bilateral post-traumatic osteoarthritis of first carpometacarpal joints: Secondary | ICD-10-CM | POA: Diagnosis not present

## 2017-02-02 DIAGNOSIS — M654 Radial styloid tenosynovitis [de Quervain]: Secondary | ICD-10-CM | POA: Diagnosis not present

## 2017-02-05 DIAGNOSIS — M79645 Pain in left finger(s): Secondary | ICD-10-CM | POA: Diagnosis not present

## 2017-02-05 DIAGNOSIS — M1812 Unilateral primary osteoarthritis of first carpometacarpal joint, left hand: Secondary | ICD-10-CM | POA: Diagnosis not present

## 2017-03-05 DIAGNOSIS — M1812 Unilateral primary osteoarthritis of first carpometacarpal joint, left hand: Secondary | ICD-10-CM | POA: Diagnosis not present

## 2017-03-05 DIAGNOSIS — M79645 Pain in left finger(s): Secondary | ICD-10-CM | POA: Diagnosis not present

## 2017-04-02 DIAGNOSIS — M1812 Unilateral primary osteoarthritis of first carpometacarpal joint, left hand: Secondary | ICD-10-CM | POA: Diagnosis not present

## 2017-04-02 DIAGNOSIS — M79645 Pain in left finger(s): Secondary | ICD-10-CM | POA: Diagnosis not present

## 2017-05-27 ENCOUNTER — Emergency Department (HOSPITAL_COMMUNITY)
Admission: EM | Admit: 2017-05-27 | Discharge: 2017-05-27 | Disposition: A | Discharge status: Home / Self Care | Payer: Medicare Other | Attending: Emergency Medicine | Admitting: Emergency Medicine

## 2017-05-27 ENCOUNTER — Emergency Department (HOSPITAL_COMMUNITY): Payer: Medicare Other

## 2017-05-27 ENCOUNTER — Encounter (HOSPITAL_COMMUNITY): Payer: Self-pay | Admitting: Emergency Medicine

## 2017-05-27 DIAGNOSIS — Z79899 Other long term (current) drug therapy: Secondary | ICD-10-CM | POA: Insufficient documentation

## 2017-05-27 DIAGNOSIS — Y939 Activity, unspecified: Secondary | ICD-10-CM | POA: Diagnosis not present

## 2017-05-27 DIAGNOSIS — Y999 Unspecified external cause status: Secondary | ICD-10-CM | POA: Diagnosis not present

## 2017-05-27 DIAGNOSIS — Y929 Unspecified place or not applicable: Secondary | ICD-10-CM | POA: Diagnosis not present

## 2017-05-27 DIAGNOSIS — M7989 Other specified soft tissue disorders: Secondary | ICD-10-CM | POA: Insufficient documentation

## 2017-05-27 DIAGNOSIS — W228XXA Striking against or struck by other objects, initial encounter: Secondary | ICD-10-CM | POA: Diagnosis not present

## 2017-05-27 DIAGNOSIS — R2232 Localized swelling, mass and lump, left upper limb: Secondary | ICD-10-CM | POA: Diagnosis not present

## 2017-05-27 DIAGNOSIS — Z96653 Presence of artificial knee joint, bilateral: Secondary | ICD-10-CM | POA: Insufficient documentation

## 2017-05-27 DIAGNOSIS — M25532 Pain in left wrist: Secondary | ICD-10-CM | POA: Diagnosis not present

## 2017-05-27 DIAGNOSIS — F17210 Nicotine dependence, cigarettes, uncomplicated: Secondary | ICD-10-CM | POA: Insufficient documentation

## 2017-05-27 DIAGNOSIS — I10 Essential (primary) hypertension: Secondary | ICD-10-CM | POA: Insufficient documentation

## 2017-05-27 DIAGNOSIS — M79642 Pain in left hand: Secondary | ICD-10-CM | POA: Diagnosis not present

## 2017-05-27 DIAGNOSIS — S6992XA Unspecified injury of left wrist, hand and finger(s), initial encounter: Secondary | ICD-10-CM | POA: Diagnosis not present

## 2017-05-27 MED ORDER — ACETAMINOPHEN 500 MG PO TABS
1000.0000 mg | ORAL_TABLET | Freq: Once | ORAL | Status: AC
  Administered 2017-05-27: 1000 mg via ORAL
  Filled 2017-05-27: qty 2

## 2017-05-27 NOTE — ED Provider Notes (Signed)
[facility] DEPT Provider Note   CSN: [id number] Arrival date & time: 05/27/17  1355  By signing my name below, I, Levester Fresh, attest that this documentation has been prepared under the direction and in the presence of Lorin Glass, [place]. Electronically Signed: Levester Fresh, Scribe. 05/27/2017. 6:23 PM.   History   Chief Complaint Chief Complaint  Patient presents with  . Hand Injury    HPI Comments [name] is a 51 y.o. female with a PMHx significant for left thumb arthritis s/p orthopedic surgical repair, who presents to the Emergency Department with complaints of sudden onset left hand and wrist pain x2 days, after hitting her hand.  Pt can't recall if she hit her hand on the side of the door or a coffee table on her way out of the house.  Pain currently rated a 10/10 in severity, with no worsening or alleviating factors reported.  Associated swelling to area, which has been worsening since onset, though pt states that "it was feeling swollen before she hit it."  No reported hx of gout.  No nausea, vomiting or fevers.  Sensation intact to fingertips.  No hx of DM.  Pt with limited ROM at baseline 2/2 known arthritis.  Pt has not attempted any OTC symptomatic management before presenting today for evaluation.   The history is provided by the patient and medical records. No language interpreter was used.    Past Medical History:  Diagnosis Date  . Anemia   . Fibroids   . Hypertension   . Hypertension    no current meds for htn  . Knee joint injury   . Thyroid goiter     Patient Active Problem List   Diagnosis Date Noted  . Encounter for work capability assessment 09/23/2016  . Hot flashes 09/23/2016  . Healthcare maintenance 09/23/2016  . Acute blood loss anemia 01/05/2014  . S/P total knee arthroplasty, 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  . Abdominal pain 05/09/2013  . Uterine fibroid 05/09/2013  . Tobacco use disorder 04/12/2013  . OA (osteoarthritis) 10/28/2011  .  ANEMIA, VITAMIN B12 DEFICIENCY 10/29/2009  . 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 MORBID OBESITY 09/17/2009  . Essential hypertension 09/17/2009  . Pain in limb 09/17/2009  . ANEMIA, HX OF 09/17/2009    Past Surgical History:  Procedure Laterality Date  . ANKLE SURGERY Right 2009   broken ankle   . arthroscopic surgery to knee Bilateral   . CESAREAN SECTION    . HAND SURGERY    . JOINT REPLACEMENT Bilateral    knees  . REPLACEMENT TOTAL KNEE Right 2012  . TOTAL KNEE ARTHROPLASTY Left 01/03/2014   Procedure: LEFT TOTAL KNEE ARTHROPLASTY;  Surgeon: Tobi Bastos, MD;  Location: [facility] ORS;  Service: Orthopedics;  Laterality: Left;  . TUBAL LIGATION      OB History    Gravida Para Term Preterm AB Living   4 2 2  0 2 2   SAB TAB Ectopic Multiple Live Births   1 1 0   2       Home Medications    Prior to Admission medications   Medication Sig Start Date End Date Taking? Authorizing Provider  clindamycin (CLEOCIN) 300 MG capsule Take 1 capsule (300 mg total) by mouth 2 (two) times daily. Patient not taking: Reported on 09/23/2016 04/29/16   Jorje Guild, NP  ferrous sulfate 325 (65 FE) MG tablet Take 1 tablet (325 mg total) by mouth 2 (two) times daily with a meal. Patient  not taking: Reported on 09/23/2016 01/21/16   Davonna Belling, MD  ibuprofen (ADVIL,MOTRIN) 200 MG tablet Take 200 mg by mouth every 6 (six) hours as needed for headache or moderate pain.    [provider]  terconazole (TERAZOL 3) 0.8 % vaginal cream Place 1 applicator vaginally at bedtime. Patient not taking: Reported on 09/23/2016 04/29/16   Jorje Guild, NP    Family History Family History  Problem Relation Age of Onset  . Hypertension Mother   . Hypertension Father   . Cancer Maternal Grandmother        ? lung    Social History Social History  Substance Use Topics  . Smoking status: Current Every Day Smoker    Packs/day: 0.20    Years: 0.00    Types: Cigarettes  . Smokeless  tobacco: Never Used     Comment: 3 cigs per day  . Alcohol use 1.0 oz/week    2 Standard drinks or equivalent per week     Comment: occasionally once per month     Allergies   Tramadol and Metronidazole   Review of Systems Review of Systems  Constitutional: Negative for chills, fatigue and fever.  Gastrointestinal: Negative for nausea and vomiting.  Musculoskeletal: Positive for arthralgias and joint swelling. Negative for myalgias.  Skin: Negative for wound.  Neurological: Negative for weakness and numbness.     Physical Exam Updated Vital Signs BP (!) 148/110   Pulse 78   Temp 98.4 F (36.9 C) (Oral)   Resp 18   SpO2 98%   Physical Exam  Constitutional: She appears well-developed and well-nourished. No distress.  HENT:  Head: Normocephalic and atraumatic.  Neck: Normal range of motion.  Pulmonary/Chest: Effort normal.  Musculoskeletal:  Left thumb with mild swelling and tenderness to palpation of entire finger. There is no obvious deformity, the finger is not abnormally warm. No obvious induration, wounds, or drainage.  No scaphoid tenderness. Thumb is warm and well perfused with intact sensation. ROM limited secondary to pain  Neurological: She is alert.  Skin: Skin is warm and dry.  Psychiatric: She has a normal mood and affect.  Nursing note and vitals reviewed.  ED Treatments / Results  DIAGNOSTIC STUDIES: Oxygen Saturation is 100% on room air, normal by my interpretation.    COORDINATION OF CARE: 3:49 PM Discussed treatment plan with pt at bedside and pt agreed to plan.  Labs (all labs ordered are listed, but only abnormal results are displayed) Labs Reviewed - No data to display  EKG  EKG Interpretation None      Radiology Dg Wrist Complete Left  Result Date: 05/27/2017 CLINICAL DATA:  Hit hand on door 2 days ago. Left wrist and hand pain 2 days ago. EXAM: LEFT WRIST - COMPLETE 3+ VIEW COMPARISON:  Left hand radiographs 01/13/2011 FINDINGS: The  trapezium has been resected. Postoperative changes are present at the base of the first and second metacarpals with associated degenerative change. No acute bone or soft tissue abnormalities are present. Degenerative changes are noted at the trapezoid. IMPRESSION: 1. Postoperative changes including resection of the trapezium. 2. Postoperative and degenerative changes of the proximal first and second metacarpals. 3. No acute abnormality. Electronically Signed   By: San Morelle M.D.   On: 05/27/2017 17:02   Dg Hand Complete Left  Result Date: 05/27/2017 CLINICAL DATA:  Diffuse left hand and wrist pain for 3 days. Wrist surgery 3/18 for arthritis. Hit hand on door 3 days ago. EXAM: LEFT HAND - COMPLETE  3+ VIEW COMPARISON:  Left and radiographs 01/13/2017. FINDINGS: Postsurgical changes are noted with resection of the trapezium. Small bone fragments remain. Degenerative changes are evident along the proximal first and second metacarpals in at the trapezoid. No acute bone or soft tissue abnormalities are present. Previously noted fracture of the proximal phalanx in the fifth digit has healed. IMPRESSION: 1. No acute abnormality. 2. Degenerative and postoperative changes involving the trapezium, first, and second CMC joints. Electronically Signed   By: San Morelle M.D.   On: 05/27/2017 17:00   Procedures Procedures (including critical care time)  Medications Ordered in ED Medications  acetaminophen (TYLENOL) tablet 1,000 mg (1,000 mg Oral Given 05/27/17 1650)   Initial Impression / Assessment and Plan / ED Course  I have reviewed the triage vital signs and the nursing notes.  Pertinent labs & imaging results that were available during my care of the patient were reviewed by me and considered in my medical decision making (see chart for details).  Patient X-Ray negative for obvious fracture or dislocation.  Pt advised to follow up with orthopedics. Patient given ice, tylenol while in ED,  conservative therapy recommended and discussed. Patient instructed to follow up with her hand surgeon.    Patient will be discharged home & is agreeable with above plan with objections to not receiving stronger pain medications as noted in clinical course.  With out patient attempting any OTC or supportive care PTA, I feel that OTC and supportive care are most appropriate for her pain at this time especially as x-rays are without acute osseous abnormality.   Patient given instructions on OTC pain medications.    Returns precautions discussed. No obvious infection.  Pt appears safe for discharge.  Patient was discussed with and seen by Dr. Gilford Raid who agrees with my plan.  While in the ED patient was noted to have an elevated blood pressure.  At this time there is nothing to suggest hypertensive urgency or emergency.  Patient was advised to follow up with their PCP or Wellness clinic regarding their elevated blood pressure.   Clinical Course as of May 27 1822  Wed May 27, 2017  1820 Patient is upset she isn't getting any thing stronger than tylenol in the emergency room.   I do not feel like narcotics are appropriate for the patient as x-rays are with out fractures or acute abnormalitiy.  She is instructed to follow up with her hand surgeon.  Of note she had an appointment with him yesterday but was a no-show.   [EH]    Clinical Course User Index [EH] Lorin Glass, PA-C    I personally performed the services described in this documentation, which was scribed in my presence. The recorded information has been reviewed and is accurate.   Final Clinical Impressions(s) / ED Diagnoses   Final diagnoses:  Swelling of left thumb    New Prescriptions Discharge Medication List as of 05/27/2017  6:05 PM       Lorin Glass, PA-C 05/27/17 2012    Isla Pence, MD 05/27/17 [phone]

## 2017-05-27 NOTE — ED Triage Notes (Signed)
Pt c/o L hand and wrist pain after hitting hand on unknown surface 2 days ago. Patient with hx of surgery to L wrist several months ago for arthritis. Pt presents to triage with edema to L hand / thumb, patient states pain worsened over 2 days.

## 2017-05-27 NOTE — Discharge Instructions (Signed)
Please take Tylenol (acetaminophen) to relieve your pain.  You make take tylenol, up to 1,000 mg (two extra strength pills) at one time.  You had 1,000 mg of tylenol here today.  Do not take more than 4,000 mg tylenol in a 24 hour period.  Please check all medication labels as many medications such as pain and cold medications may contain tylenol.  Do not drink alcohol while taking these medications.

## 2017-05-28 ENCOUNTER — Ambulatory Visit: Payer: Self-pay

## 2017-06-04 DIAGNOSIS — M79645 Pain in left finger(s): Secondary | ICD-10-CM | POA: Diagnosis not present

## 2017-06-04 DIAGNOSIS — Z9889 Other specified postprocedural states: Secondary | ICD-10-CM | POA: Diagnosis not present

## 2017-06-04 DIAGNOSIS — M1812 Unilateral primary osteoarthritis of first carpometacarpal joint, left hand: Secondary | ICD-10-CM | POA: Diagnosis not present

## 2017-06-18 DIAGNOSIS — M542 Cervicalgia: Secondary | ICD-10-CM | POA: Diagnosis not present

## 2017-06-18 DIAGNOSIS — R490 Dysphonia: Secondary | ICD-10-CM | POA: Diagnosis not present

## 2017-06-18 DIAGNOSIS — H9203 Otalgia, bilateral: Secondary | ICD-10-CM | POA: Diagnosis not present

## 2017-06-18 DIAGNOSIS — F17210 Nicotine dependence, cigarettes, uncomplicated: Secondary | ICD-10-CM | POA: Diagnosis not present

## 2017-06-18 DIAGNOSIS — E611 Iron deficiency: Secondary | ICD-10-CM | POA: Diagnosis not present

## 2017-06-18 DIAGNOSIS — E040 Nontoxic diffuse goiter: Secondary | ICD-10-CM | POA: Diagnosis not present

## 2017-06-18 DIAGNOSIS — R070 Pain in throat: Secondary | ICD-10-CM | POA: Diagnosis not present

## 2017-06-18 DIAGNOSIS — Z7289 Other problems related to lifestyle: Secondary | ICD-10-CM | POA: Diagnosis not present

## 2017-06-22 ENCOUNTER — Other Ambulatory Visit: Payer: Self-pay | Admitting: Otolaryngology

## 2017-06-22 DIAGNOSIS — R1310 Dysphagia, unspecified: Secondary | ICD-10-CM

## 2017-06-23 ENCOUNTER — Encounter: Payer: Self-pay | Admitting: Internal Medicine

## 2017-06-23 ENCOUNTER — Ambulatory Visit (INDEPENDENT_AMBULATORY_CARE_PROVIDER_SITE_OTHER): Payer: Medicare Other | Admitting: Internal Medicine

## 2017-06-23 ENCOUNTER — Ambulatory Visit
Admission: RE | Admit: 2017-06-23 | Discharge: 2017-06-23 | Disposition: A | Discharge status: Home / Self Care | Payer: Medicare Other | Source: Ambulatory Visit | Attending: Otolaryngology | Admitting: Otolaryngology

## 2017-06-23 VITALS — BP 131/90 | HR 65 | Temp 98.2°F | Ht 66.0 in | Wt 249.3 lb

## 2017-06-23 DIAGNOSIS — Z780 Asymptomatic menopausal state: Secondary | ICD-10-CM | POA: Diagnosis not present

## 2017-06-23 DIAGNOSIS — E041 Nontoxic single thyroid nodule: Secondary | ICD-10-CM | POA: Diagnosis not present

## 2017-06-23 DIAGNOSIS — F17210 Nicotine dependence, cigarettes, uncomplicated: Secondary | ICD-10-CM | POA: Diagnosis not present

## 2017-06-23 DIAGNOSIS — Z0000 Encounter for general adult medical examination without abnormal findings: Secondary | ICD-10-CM

## 2017-06-23 DIAGNOSIS — R1310 Dysphagia, unspecified: Secondary | ICD-10-CM

## 2017-06-23 DIAGNOSIS — N951 Menopausal and female climacteric states: Secondary | ICD-10-CM | POA: Diagnosis not present

## 2017-06-23 DIAGNOSIS — F0630 Mood disorder due to known physiological condition, unspecified: Secondary | ICD-10-CM

## 2017-06-23 DIAGNOSIS — Z862 Personal history of diseases of the blood and blood-forming organs and certain disorders involving the immune mechanism: Secondary | ICD-10-CM | POA: Diagnosis not present

## 2017-06-23 DIAGNOSIS — I10 Essential (primary) hypertension: Secondary | ICD-10-CM

## 2017-06-23 DIAGNOSIS — N898 Other specified noninflammatory disorders of vagina: Secondary | ICD-10-CM

## 2017-06-23 DIAGNOSIS — R61 Generalized hyperhidrosis: Secondary | ICD-10-CM | POA: Diagnosis not present

## 2017-06-23 MED ORDER — IOPAMIDOL (ISOVUE-300) INJECTION 61%
75.0000 mL | Freq: Once | INTRAVENOUS | Status: AC | PRN
  Administered 2017-06-23: 75 mL via INTRAVENOUS

## 2017-06-24 LAB — CBC
HEMATOCRIT: 29.6 % — AB (ref 34.0–46.6)
Hemoglobin: 9 g/dL — ABNORMAL LOW (ref 11.1–15.9)
MCH: 23.3 pg — AB (ref 26.6–33.0)
MCHC: 30.4 g/dL — AB (ref 31.5–35.7)
MCV: 77 fL — ABNORMAL LOW (ref 79–97)
Platelets: 269 10*3/uL (ref 150–379)
RBC: 3.87 x10E6/uL (ref 3.77–5.28)
RDW: 20 % — AB (ref 12.3–15.4)
WBC: 7.9 10*3/uL (ref 3.4–10.8)

## 2017-06-24 LAB — BMP8+ANION GAP
Anion Gap: 15 mmol/L (ref 10.0–18.0)
BUN / CREAT RATIO: 18 (ref 9–23)
BUN: 14 mg/dL (ref 6–24)
CHLORIDE: 106 mmol/L (ref 96–106)
CO2: 21 mmol/L (ref 20–29)
Calcium: 9 mg/dL (ref 8.7–10.2)
Creatinine, Ser: 0.8 mg/dL (ref 0.57–1.00)
GFR calc non Af Amer: 85 mL/min/{1.73_m2} (ref >59)
GFR, EST AFRICAN AMERICAN: 98 mL/min/{1.73_m2} (ref >59)
GLUCOSE: 73 mg/dL (ref 65–99)
POTASSIUM: 4.6 mmol/L (ref 3.5–5.2)
Sodium: 142 mmol/L (ref 134–144)

## 2017-06-24 LAB — TSH: TSH: 1.29 u[IU]/mL (ref 0.450–4.500)

## 2017-06-24 LAB — FERRITIN: Ferritin: 13 ng/mL — ABNORMAL LOW (ref 15–150)

## 2017-06-24 NOTE — Assessment & Plan Note (Signed)
Referral for screening mammogram has been placed. Patient states she has already received tetanus vaccination. Advised her to bring records with her to her next visit.

## 2017-06-24 NOTE — Assessment & Plan Note (Addendum)
BP Readings from Last 3 Encounters:  06/23/17 131/90  05/27/17 (!) 148/110  09/23/16 (!) 142/86    Lab Results  Component Value Date   NA 142 06/23/2017   K 4.6 06/23/2017   CREATININE 0.80 06/23/2017    Assessment: Blood pressure control:  well-controlled Comments: Currently not on any medications.BMP showing normal renal function.  Plan: Medications: None Educational resources provided: brochure (denies need ). Educated patient about healthy eating and exercise. Emphasized the importance of weight loss.  Addendum 06/29/2017:  Tried calling the patient but could not reach her over the phone. Left voicemail asking her to call the clinic back.

## 2017-06-24 NOTE — Assessment & Plan Note (Addendum)
History of present illness Patient reports having mood swings, sweating at night, and vaginal dryness for the past 4 months. States her last menstrual period was in March 2018. Also reports having occasional heart palpitations with no associated chest pain or shortness of breath.  Assessment Her current symptoms could possibly be explained by menopause. TSH normal. I discussed the pros and cons of hormone replacement therapy with the patient. She prefers not taking any hormonal medications including vaginal creams.  Plan -Will discuss starting patient on Effexor 37.5 mg daily  Addendum 06/29/2017:  Tried calling the patient but could not reach her over the phone. Left voicemail asking her to call the clinic back.

## 2017-06-24 NOTE — Assessment & Plan Note (Addendum)
Assessment Microcytic anemia. Labs that this visit showing hemoglobin 9.0 (at baseline) and MCV 77. Ferritin low at 13.  Plan -Will discuss starting patient on iron supplementation  Addendum 06/29/2017:  Tried calling the patient but could not reach her over the phone. Left voicemail asking her to call the clinic back. Will send a letter as well. Plan to start Ferrous gluconate 324 mg daily with breakfast.

## 2017-06-24 NOTE — Progress Notes (Signed)
   CC: Patient is complaining of mood swings and vaginal dryness. Hypertension, anemia, and preventative health care were also discussed during the visit.  HPI:  [name] is a 51 y.o. female with a past medical history of conditions listed below presenting to the clinic complaining of mood swings and vaginal dryness. Hypertension, anemia, and preventative health care were also discussed during this visit. Please see problem based charting for the status of the patient's current and chronic medical conditions.   Past Medical History:  Diagnosis Date  . Anemia   . Fibroids   . Hypertension   . Hypertension    no current meds for htn  . Knee joint injury   . Thyroid goiter    Review of Systems: Pertinent positives mentioned in HPI. Remainder of all ROS negative.   Physical Exam:  Vitals:   06/23/17 1329  BP: 131/90  Pulse: 65  Temp: 98.2 F (36.8 C)  TempSrc: Oral  SpO2: 100%  Weight: 249 lb 4.8 oz (113.1 kg)  Height: 5\' 6"  (1.676 m)   Physical Exam  Constitutional: She is oriented to person, place, and time. She appears well-developed and well-nourished. No distress.  HENT:  Head: Normocephalic and atraumatic.  Eyes: Right eye exhibits no discharge. Left eye exhibits no discharge.  Cardiovascular: Normal rate, regular rhythm and intact distal pulses.   Pulmonary/Chest: Effort normal and breath sounds normal. No respiratory distress. She has no wheezes. She has no rales.  Abdominal: Soft. Bowel sounds are normal. She exhibits no distension. There is no tenderness.  Musculoskeletal: She exhibits no edema.  Neurological: She is alert and oriented to person, place, and time.  Skin: Skin is warm and dry.    Assessment & Plan:   See Encounters Tab for problem based charting.  Patient discussed with Dr. Angelia Mould

## 2017-06-25 ENCOUNTER — Encounter: Payer: Self-pay | Admitting: *Deleted

## 2017-06-26 NOTE — Progress Notes (Signed)
Internal Medicine Clinic Attending  Case discussed with Dr. Rathoreat the time of the visit. We reviewed the resident's history and exam and pertinent patient test results. I agree with the assessment, diagnosis, and plan of care documented in the resident's note.  

## 2017-06-29 ENCOUNTER — Encounter: Payer: Self-pay | Admitting: Internal Medicine

## 2017-06-29 MED ORDER — FERROUS GLUCONATE 324 (38 FE) MG PO TABS: 324.0000 mg | ORAL_TABLET | Freq: Every day | ORAL | 1 refills | Status: DC

## 2017-06-29 NOTE — Addendum Note (Signed)
Addended by: Shela Leff on: 06/29/2017 11:31 AM   Modules accepted: Orders

## 2017-07-01 ENCOUNTER — Telehealth: Payer: Self-pay

## 2017-07-01 NOTE — Telephone Encounter (Signed)
Requesting lab results. Please call back.  

## 2017-07-02 ENCOUNTER — Telehealth: Payer: Self-pay

## 2017-07-02 NOTE — Telephone Encounter (Signed)
Pt is calling back, requesting lab results. Please call back.

## 2017-07-03 NOTE — Telephone Encounter (Signed)
I have tried calling the patient several times and am not able to reach her over the phone. Letter has already been sent. Spoke to [employer] today, she will send another letter. Thanks.

## 2017-07-10 ENCOUNTER — Other Ambulatory Visit: Payer: Self-pay | Admitting: Internal Medicine

## 2017-07-10 DIAGNOSIS — Z1231 Encounter for screening mammogram for malignant neoplasm of breast: Secondary | ICD-10-CM

## 2017-07-23 ENCOUNTER — Ambulatory Visit: Payer: Self-pay

## 2017-07-28 ENCOUNTER — Encounter (HOSPITAL_COMMUNITY): Payer: Self-pay

## 2017-07-28 ENCOUNTER — Emergency Department (HOSPITAL_COMMUNITY)
Admission: EM | Admit: 2017-07-28 | Discharge: 2017-07-28 | Disposition: A | Discharge status: Home / Self Care | Payer: Medicare Other | Attending: Emergency Medicine | Admitting: Emergency Medicine

## 2017-07-28 ENCOUNTER — Emergency Department (HOSPITAL_COMMUNITY): Payer: Medicare Other

## 2017-07-28 DIAGNOSIS — I10 Essential (primary) hypertension: Secondary | ICD-10-CM | POA: Insufficient documentation

## 2017-07-28 DIAGNOSIS — F17210 Nicotine dependence, cigarettes, uncomplicated: Secondary | ICD-10-CM | POA: Diagnosis not present

## 2017-07-28 DIAGNOSIS — R109 Unspecified abdominal pain: Secondary | ICD-10-CM | POA: Diagnosis not present

## 2017-07-28 LAB — CBC WITH DIFFERENTIAL/PLATELET
BASOS PCT: 0 %
Basophils Absolute: 0 10*3/uL (ref 0.0–0.1)
EOS PCT: 5 %
Eosinophils Absolute: 0.2 10*3/uL (ref 0.0–0.7)
HEMATOCRIT: 29.5 % — AB (ref 36.0–46.0)
Hemoglobin: 9 g/dL — ABNORMAL LOW (ref 12.0–15.0)
Lymphocytes Relative: 24 %
Lymphs Abs: 1 10*3/uL (ref 0.7–4.0)
MCH: 23.1 pg — ABNORMAL LOW (ref 26.0–34.0)
MCHC: 30.5 g/dL (ref 30.0–36.0)
MCV: 75.8 fL — AB (ref 78.0–100.0)
MONO ABS: 0.4 10*3/uL (ref 0.1–1.0)
MONOS PCT: 9 %
NEUTROS ABS: 2.6 10*3/uL (ref 1.7–7.7)
Neutrophils Relative %: 62 %
PLATELETS: 216 10*3/uL (ref 150–400)
RBC: 3.89 MIL/uL (ref 3.87–5.11)
RDW: 19.7 % — AB (ref 11.5–15.5)
WBC: 4.2 10*3/uL (ref 4.0–10.5)

## 2017-07-28 LAB — COMPREHENSIVE METABOLIC PANEL
ALT: 13 U/L — ABNORMAL LOW (ref 14–54)
ANION GAP: 7 (ref 5–15)
AST: 21 U/L (ref 15–41)
Albumin: 3.7 g/dL (ref 3.5–5.0)
Alkaline Phosphatase: 77 U/L (ref 38–126)
BILIRUBIN TOTAL: 0.7 mg/dL (ref 0.3–1.2)
BUN: 15 mg/dL (ref 6–20)
CO2: 24 mmol/L (ref 22–32)
Calcium: 9.1 mg/dL (ref 8.9–10.3)
Chloride: 105 mmol/L (ref 101–111)
Creatinine, Ser: 1.05 mg/dL — ABNORMAL HIGH (ref 0.44–1.00)
GFR calc Af Amer: >60 mL/min (ref >60)
GFR, EST NON AFRICAN AMERICAN: 60 mL/min — AB (ref >60)
Glucose, Bld: 97 mg/dL (ref 65–99)
POTASSIUM: 3.9 mmol/L (ref 3.5–5.1)
Sodium: 136 mmol/L (ref 135–145)
TOTAL PROTEIN: 6.8 g/dL (ref 6.5–8.1)

## 2017-07-28 LAB — URINALYSIS, ROUTINE W REFLEX MICROSCOPIC
Bilirubin Urine: NEGATIVE
GLUCOSE, UA: NEGATIVE mg/dL
HGB URINE DIPSTICK: NEGATIVE
KETONES UR: NEGATIVE mg/dL
Leukocytes, UA: NEGATIVE
Nitrite: NEGATIVE
PH: 6 (ref 5.0–8.0)
Protein, ur: 30 mg/dL — AB
Specific Gravity, Urine: 1.028 (ref 1.005–1.030)

## 2017-07-28 LAB — LIPASE, BLOOD: LIPASE: 41 U/L (ref 11–51)

## 2017-07-28 MED ORDER — LIDOCAINE 4 % EX CREA
TOPICAL_CREAM | Freq: Once | CUTANEOUS | Status: AC
  Administered 2017-07-28: 1 via TOPICAL
  Filled 2017-07-28: qty 5

## 2017-07-28 MED ORDER — LIDOCAINE 5 % EX PTCH: 1.0000 | MEDICATED_PATCH | CUTANEOUS | 0 refills | Status: DC

## 2017-07-28 MED ORDER — METHOCARBAMOL 500 MG PO TABS: 500.0000 mg | ORAL_TABLET | Freq: Two times a day (BID) | ORAL | 0 refills | Status: DC | PRN

## 2017-07-28 NOTE — ED Triage Notes (Signed)
Patient complains of left flank and dysuria for 1 week. No other associated symptoms, denies fever. Alert and oriented

## 2017-07-28 NOTE — ED Provider Notes (Signed)
[place] DEPT Provider Note   CSN: [id number] Arrival date & time: 07/28/17  1046     History   Chief Complaint No chief complaint on file.    HPI  Blood pressure (!) 178/105, pulse 78, temperature 98.3 F (36.8 C), temperature source Oral, resp. rate 18, SpO2 98 %.  [name] is a 51 y.o. female complaining of atraumatic left flank pain over the course of the last 2 weeks, worsening and exacerbated by movement and position. She's taking ibuprofen at home with some relief. She has no associated nausea, vomiting, anterior abdominal pain, chest pain, cough, dysuria, hematuria, history of kidney stones.  Past Medical History:  Diagnosis Date  . Anemia   . Fibroids   . Hypertension   . Hypertension    no current meds for htn  . Knee joint injury   . Thyroid goiter     Patient Active Problem List   Diagnosis Date Noted  . Menopause 06/23/2017  . Encounter for work capability assessment 09/23/2016  . Hot flashes 09/23/2016  . Healthcare maintenance 09/23/2016  . Acute blood loss anemia 01/05/2014  . S/P total knee arthroplasty, 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  . Abdominal pain 05/09/2013  . Uterine fibroid 05/09/2013  . Tobacco use disorder 04/12/2013  . OA (osteoarthritis) 10/28/2011  . ANEMIA, VITAMIN B12 DEFICIENCY 10/29/2009  . 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 MORBID OBESITY 09/17/2009  . Essential hypertension 09/17/2009  . Pain in limb 09/17/2009  . ANEMIA, HX OF 09/17/2009    Past Surgical History:  Procedure Laterality Date  . ANKLE SURGERY Right 2009   broken ankle   . arthroscopic surgery to knee Bilateral   . CESAREAN SECTION    . HAND SURGERY    . JOINT REPLACEMENT Bilateral    knees  . REPLACEMENT TOTAL KNEE Right 2012  . TOTAL KNEE ARTHROPLASTY Left 01/03/2014   Procedure: LEFT TOTAL KNEE ARTHROPLASTY;  Surgeon: Tobi Bastos, MD;  Location: [facility];  Service: Orthopedics;  Laterality: Left;  . TUBAL LIGATION      OB History    Gravida  Para Term Preterm AB Living   4 2 2  0 2 2   SAB TAB Ectopic Multiple Live Births   1 1 0   2       Home Medications    Prior to Admission medications   Medication Sig Start Date End Date Taking? Authorizing Provider  ferrous gluconate (FERGON) 324 MG tablet Take 1 tablet (324 mg total) by mouth daily with breakfast. 06/29/17   Shela Leff, MD  ibuprofen (ADVIL,MOTRIN) 200 MG tablet Take 200 mg by mouth every 6 (six) hours as needed for headache or moderate pain.    [provider]  lidocaine (LIDODERM) 5 % Place 1 patch onto the skin daily. Remove & Discard patch within 12 hours or as directed by MD 07/28/17   Janiqua Friscia, Elmyra Ricks, PA-C  methocarbamol (ROBAXIN) 500 MG tablet Take 1 tablet (500 mg total) by mouth 2 (two) times daily as needed for muscle spasms. 07/28/17   Dannie Woolen, Elmyra Ricks, PA-C    Family History Family History  Problem Relation Age of Onset  . Hypertension Mother   . Hypertension Father   . Cancer Maternal Grandmother        ? lung    Social History Social History  Substance Use Topics  . Smoking status: Current Every Day Smoker    Packs/day: 0.20    Years: 0.00    Types: Cigarettes  . Smokeless tobacco:  Never Used     Comment: 1-2  cigs per day  . Alcohol use 1.0 oz/week    2 Standard drinks or equivalent per week     Comment: occasionally once per month     Allergies   Tramadol; Ibuprofen; and Metronidazole   Review of Systems Review of Systems  A complete review of systems was obtained and all systems are negative except as noted in the HPI and PMH.    Physical Exam Updated Vital Signs BP (!) 151/97   Pulse 83   Temp 98.3 F (36.8 C) (Oral)   Resp 18   SpO2 100%   Physical Exam  Constitutional: She is oriented to person, place, and time. She appears well-developed and well-nourished. No distress.  HENT:  Head: Normocephalic.  Mouth/Throat: Oropharynx is clear and moist.  Eyes: Conjunctivae and EOM are normal.  Neck: Normal  range of motion.  Cardiovascular: Normal rate.   Pulmonary/Chest: Effort normal. No stridor.  Abdominal: Soft. Bowel sounds are normal. She exhibits no distension and no mass. There is no tenderness. There is no rebound and no guarding.  Genitourinary:  Genitourinary Comments: No CVA tenderness to percussion bilaterally  Musculoskeletal: Normal range of motion.  Neurological: She is alert and oriented to person, place, and time.  Skin:     Psychiatric: She has a normal mood and affect.  Nursing note and vitals reviewed.    ED Treatments / Results  Labs (all labs ordered are listed, but only abnormal results are displayed) Labs Reviewed  URINALYSIS, ROUTINE W REFLEX MICROSCOPIC - Abnormal; Notable for the following:       Result Value   Protein, ur 30 (*)    Bacteria, UA RARE (*)    Squamous Epithelial / LPF 0-5 (*)    All other components within normal limits  CBC WITH DIFFERENTIAL/PLATELET - Abnormal; Notable for the following:    Hemoglobin 9.0 (*)    HCT 29.5 (*)    MCV 75.8 (*)    MCH 23.1 (*)    RDW 19.7 (*)    All other components within normal limits  COMPREHENSIVE METABOLIC PANEL - Abnormal; Notable for the following:    Creatinine, Ser 1.05 (*)    ALT 13 (*)    GFR calc non Af Amer 60 (*)    All other components within normal limits  LIPASE, BLOOD    EKG  EKG Interpretation None       Radiology US Renal  Result Date: 07/28/2017 CLINICAL DATA:  Left flank pain for 2 week EXAM: RENAL / URINARY TRACT ULTRASOUND COMPLETE COMPARISON:  01/21/2016 FINDINGS: Right Kidney: Length: 10 cm. Echogenicity within normal limits. No mass or hydronephrosis visualized. No visible stone. Left Kidney: Length: 10 cm. Echogenicity within normal limits. No mass or hydronephrosis visualized. No visible stone. Bladder: Appears normal for degree of bladder distention. IMPRESSION: Normal renal ultrasound. Electronically Signed   By: Monte Fantasia M.D.   On: 07/28/2017 12:57     Procedures Procedures (including critical care time)  Medications Ordered in ED Medications  lidocaine (LMX) 4 % cream (1 application Topical Given 07/28/17 1230)     Initial Impression / Assessment and Plan / ED Course  I have reviewed the triage vital signs and the nursing notes.  Pertinent labs & imaging results that were available during my care of the patient were reviewed by me and considered in my medical decision making (see chart for details).     Vitals:   07/28/17 1057  07/28/17 1130 07/28/17 1135  BP: (!) 178/105 (!) 151/97 (!) 151/97  Pulse: 78 65 83  Resp: 18  18  Temp: 98.3 F (36.8 C)    TempSrc: Oral    SpO2: 98% 100% 100%    Medications  lidocaine (LMX) 4 % cream (1 application Topical Given 07/28/17 1230)    [name] is 51 y.o. female presenting with Positional left flank/left low back pain onset 2 weeks ago. No urinary symptoms, vital signs reassuring, initially blood pressure is elevated however, this is resolved on my exam. No anterior abdominal pain, no focal CVA tenderness to percussion, she does have a hyperesthesia to the area as noted, will check basic blood work, get renal ultrasound and give topical lidocaine for comfort.  Blood work and renal ultrasound reassuring, patient will be written a prescription for muscle relaxer and topical lidocaine, asked to follow closely with primary care.  Evaluation does not show pathology that would require ongoing emergent intervention or inpatient treatment. Pt is hemodynamically stable and mentating appropriately. Discussed findings and plan with patient/guardian, who agrees with care plan. All questions answered. Return precautions discussed and outpatient follow up given.      Final Clinical Impressions(s) / ED Diagnoses   Final diagnoses:  Left flank pain    New Prescriptions New Prescriptions   LIDOCAINE (LIDODERM) 5 %    Place 1 patch onto the skin daily. Remove & Discard patch within 12  hours or as directed by MD   METHOCARBAMOL (ROBAXIN) 500 MG TABLET    Take 1 tablet (500 mg total) by mouth 2 (two) times daily as needed for muscle spasms.     Waynetta Pean 07/28/17 1313    Dorie Rank, MD 07/30/17 Benancio Deeds

## 2017-07-28 NOTE — ED Notes (Signed)
Patient transported to Ultrasound 

## 2017-07-28 NOTE — Discharge Instructions (Signed)
Please follow with your primary care doctor in the next 2 days for a check-up. They must obtain records for further management.  ° °Do not hesitate to return to the Emergency Department for any new, worsening or concerning symptoms.  ° °

## 2017-09-03 ENCOUNTER — Ambulatory Visit (INDEPENDENT_AMBULATORY_CARE_PROVIDER_SITE_OTHER): Payer: Medicare Other | Admitting: Podiatry

## 2017-09-03 ENCOUNTER — Ambulatory Visit (INDEPENDENT_AMBULATORY_CARE_PROVIDER_SITE_OTHER): Payer: Medicare Other

## 2017-09-03 ENCOUNTER — Other Ambulatory Visit: Payer: Self-pay | Admitting: Podiatry

## 2017-09-03 ENCOUNTER — Encounter: Payer: Self-pay | Admitting: Podiatry

## 2017-09-03 VITALS — BP 144/92 | HR 70 | Resp 16

## 2017-09-03 DIAGNOSIS — Z969 Presence of functional implant, unspecified: Secondary | ICD-10-CM

## 2017-09-03 DIAGNOSIS — G8929 Other chronic pain: Secondary | ICD-10-CM

## 2017-09-03 DIAGNOSIS — T8484XA Pain due to internal orthopedic prosthetic devices, implants and grafts, initial encounter: Secondary | ICD-10-CM

## 2017-09-03 DIAGNOSIS — M79672 Pain in left foot: Principal | ICD-10-CM

## 2017-09-03 DIAGNOSIS — L03119 Cellulitis of unspecified part of limb: Secondary | ICD-10-CM | POA: Diagnosis not present

## 2017-09-03 DIAGNOSIS — M7661 Achilles tendinitis, right leg: Secondary | ICD-10-CM

## 2017-09-03 DIAGNOSIS — L02619 Cutaneous abscess of unspecified foot: Secondary | ICD-10-CM

## 2017-09-03 DIAGNOSIS — M25571 Pain in right ankle and joints of right foot: Principal | ICD-10-CM

## 2017-09-03 DIAGNOSIS — M79671 Pain in right foot: Secondary | ICD-10-CM

## 2017-09-03 NOTE — Progress Notes (Signed)
Subjective:    Patient ID: [name], female    DOB: 1966-08-09, 51 y.o.   MRN: [id number]  HPI Chief Complaint  Patient presents with  . Ankle Pain    Right foot; both sides; pt stated, "Can feel screws on both sides of the ankle from surgery; feels like plates are falling apart; had surgery done in May 2005 from another office"  . Foot Pain    Right foot; back of heel  . Painful lesion    Right foot; lateral side - arch area; pt stated, "Fills up with pus and drains; has happened twice; last time pus came out was this week on Monday or Tuesday"   51 y.o. female presents with the above complaint. Reports chronic pain to the R ankle due to prior ankle fracture. States she can feel the screws through her skin. Complains of back of heel pain also described as tightness. Reports recent lesion formation with purulent exudate within the past week. Unsure of cause. Denies known injury or bite.  Past Medical History:  Diagnosis Date  . Anemia   . Fibroids   . Hypertension   . Hypertension    no current meds for htn  . Knee joint injury   . Thyroid goiter    Past Surgical History:  Procedure Laterality Date  . ANKLE SURGERY Right 2009   broken ankle   . arthroscopic surgery to knee Bilateral   . CESAREAN SECTION    . HAND SURGERY    . JOINT REPLACEMENT Bilateral    knees  . REPLACEMENT TOTAL KNEE Right 2012  . TOTAL KNEE ARTHROPLASTY Left 01/03/2014   Procedure: LEFT TOTAL KNEE ARTHROPLASTY;  Surgeon: Tobi Bastos, MD;  Location: [facility];  Service: Orthopedics;  Laterality: Left;  . TUBAL LIGATION     No current outpatient prescriptions on file.  Allergies  Allergen Reactions  . Tramadol Itching  . Ibuprofen Nausea And Vomiting  . Metronidazole Itching and Nausea And Vomiting   Review of Systems  Constitutional: Positive for unexpected weight change.  Eyes: Positive for pain and itching.  Cardiovascular: Positive for leg swelling.  Musculoskeletal: Positive for  arthralgias, back pain and gait problem.  All other systems reviewed and are negative.     Objective:   Physical Exam Vitals:   09/03/17 1505  BP: (!) 144/92  Pulse: 70  Resp: 16   General AA&O x3. Normal mood and affect.  Vascular Dorsalis pedis and posterior tibial pulses  present 2+ bilaterally  Capillary refill normal to all digits. Pedal hair growth normal.  Neurologic Epicritic sensation grossly present.  Dermatologic No open lesions. Interspaces clear of maceration. Nails well groomed and normal in appearance. R foot lesion laterally proximal to the 5th met base - hyperkeratotic lesion with small purulent exudate.  Orthopedic: MMT 5/5 in dorsiflexion, plantarflexion, inversion, and eversion. Normal joint ROM without pain or crepitus. Prominent, palpable, and painful hardware at both medial/lateral aspects of R ankle. Decreased DF R ankle with knee flexed and extended.   Radiographs: Evidence of prior screw/plate fixation R ankle. No evidence of hardware failure or backing out. No acute fractures. Joint spaces maintained.    Assessment & Plan:  Abscess of Foot -Unclear cause of abscess. -Abscess debrided as below. -Betadine ointment and Band-aid applied. -Advised patient to soak the area daily and apply triple abx ointment and a band-aid.  Achilles Tendonitis -Discussed the importance of stretching. -If she fails stretching would consider Gastrocnemius recession when patient undergoes  ROH for below.  Painful Retained Orthopedic Hardware -XR reviewed with patient. -Discussed possible surgical removal of the painful retained screws. However advised patient that we cannot proceed with surgery until the above abscess is healed. If abscess healed at next visit will plan to schedule the procedure.  30 minutes of face to face time were spent with the patient. >50% of this was spent on counseling and coordination of care. Specifically discussed with patient the above  diagnoses and treatment plan.  Procedure: I&D of Abscess Anesthesia: none Instrumentation: 312 blade, tissue nipper Technique: Hyperkeratotic skin was debrided with a 312 blade and tissue nipper. Upon debridement local abscess with small amount of purulent return was encountered. The area was explored with a cotton tipped applicator and did not probe deep. The area was cleansed and dressing applied. Dressing: Povidone ointment and band-aid. Disposition: Patient tolerated procedure well. Patient to return in 2 weeks for follow-up.

## 2017-09-08 ENCOUNTER — Encounter: Payer: Self-pay | Admitting: Internal Medicine

## 2017-09-15 ENCOUNTER — Encounter: Payer: Self-pay | Admitting: Internal Medicine

## 2017-09-17 ENCOUNTER — Ambulatory Visit: Payer: Medicare Other | Admitting: Podiatry

## 2017-09-23 ENCOUNTER — Ambulatory Visit: Payer: Medicare Other | Admitting: Podiatry

## 2017-10-11 ENCOUNTER — Encounter (HOSPITAL_COMMUNITY): Payer: Self-pay

## 2017-10-11 ENCOUNTER — Emergency Department (HOSPITAL_COMMUNITY)
Admission: EM | Admit: 2017-10-11 | Discharge: 2017-10-11 | Disposition: A | Discharge status: Home / Self Care | Payer: Medicare Other | Attending: Emergency Medicine | Admitting: Emergency Medicine

## 2017-10-11 DIAGNOSIS — Z043 Encounter for examination and observation following other accident: Secondary | ICD-10-CM | POA: Diagnosis not present

## 2017-10-11 DIAGNOSIS — L84 Corns and callosities: Secondary | ICD-10-CM | POA: Insufficient documentation

## 2017-10-11 DIAGNOSIS — I10 Essential (primary) hypertension: Secondary | ICD-10-CM | POA: Insufficient documentation

## 2017-10-11 DIAGNOSIS — Z96652 Presence of left artificial knee joint: Secondary | ICD-10-CM | POA: Insufficient documentation

## 2017-10-11 DIAGNOSIS — Z79899 Other long term (current) drug therapy: Secondary | ICD-10-CM | POA: Insufficient documentation

## 2017-10-11 DIAGNOSIS — M25511 Pain in right shoulder: Secondary | ICD-10-CM | POA: Diagnosis not present

## 2017-10-11 DIAGNOSIS — F17210 Nicotine dependence, cigarettes, uncomplicated: Secondary | ICD-10-CM | POA: Insufficient documentation

## 2017-10-11 MED ORDER — CELECOXIB 200 MG PO CAPS: 200.0000 mg | ORAL_CAPSULE | Freq: Two times a day (BID) | ORAL | 0 refills | Status: DC

## 2017-10-11 MED ORDER — BACLOFEN 10 MG PO TABS: 10.0000 mg | ORAL_TABLET | Freq: Three times a day (TID) | ORAL | 0 refills | Status: DC

## 2017-10-11 NOTE — Discharge Instructions (Signed)
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
## 2017-10-14 ENCOUNTER — Encounter: Payer: Self-pay | Admitting: Internal Medicine

## 2017-10-14 ENCOUNTER — Ambulatory Visit (INDEPENDENT_AMBULATORY_CARE_PROVIDER_SITE_OTHER): Payer: Medicare Other | Admitting: Internal Medicine

## 2017-10-14 DIAGNOSIS — I10 Essential (primary) hypertension: Secondary | ICD-10-CM | POA: Diagnosis not present

## 2017-10-14 DIAGNOSIS — D219 Benign neoplasm of connective and other soft tissue, unspecified: Secondary | ICD-10-CM

## 2017-10-14 DIAGNOSIS — R2231 Localized swelling, mass and lump, right upper limb: Secondary | ICD-10-CM

## 2017-10-14 DIAGNOSIS — M25511 Pain in right shoulder: Secondary | ICD-10-CM

## 2017-10-14 DIAGNOSIS — M546 Pain in thoracic spine: Secondary | ICD-10-CM

## 2017-10-14 DIAGNOSIS — M79601 Pain in right arm: Secondary | ICD-10-CM

## 2017-10-14 DIAGNOSIS — M542 Cervicalgia: Secondary | ICD-10-CM

## 2017-10-14 DIAGNOSIS — R110 Nausea: Secondary | ICD-10-CM

## 2017-10-14 DIAGNOSIS — G8911 Acute pain due to trauma: Secondary | ICD-10-CM | POA: Diagnosis not present

## 2017-10-14 DIAGNOSIS — F17210 Nicotine dependence, cigarettes, uncomplicated: Secondary | ICD-10-CM

## 2017-10-14 DIAGNOSIS — E049 Nontoxic goiter, unspecified: Secondary | ICD-10-CM

## 2017-10-14 MED ORDER — DICLOFENAC SODIUM 1 % TD GEL
2.0000 g | Freq: Four times a day (QID) | TRANSDERMAL | 0 refills | Status: DC
Start: 2017-10-14 — End: 2018-01-25

## 2017-10-14 NOTE — Patient Instructions (Signed)
It was a pleasure to see you today Ms. Netz. Please make the following changes:  Please apply heat to area of pain Use voltaren gel Use baclofen for pain Return to clinic as needed   If you have any questions or concerns, please call our clinic at [phone] between 9am-5pm and after hours call [phone] and ask for the internal medicine resident on call. If you feel you are having a medical emergency please call 911.   Thank you, we look forward to help you remain healthy!  Lars Mage, MD Internal Medicine PGY1
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
## 2017-10-15 NOTE — Progress Notes (Signed)
Internal Medicine Clinic Attending  I saw and evaluated the patient.  I personally confirmed the key portions of the history and exam documented by Dr. Chundi and I reviewed pertinent patient test results.  The assessment, diagnosis, and plan were formulated together and I agree with the documentation in the resident's note. 

## 2017-10-21 ENCOUNTER — Encounter: Payer: Self-pay | Admitting: Podiatry

## 2017-10-21 ENCOUNTER — Telehealth: Payer: Self-pay | Admitting: Podiatry

## 2017-10-21 ENCOUNTER — Ambulatory Visit (INDEPENDENT_AMBULATORY_CARE_PROVIDER_SITE_OTHER): Payer: Medicare Other | Admitting: Podiatry

## 2017-10-21 DIAGNOSIS — G8929 Other chronic pain: Secondary | ICD-10-CM

## 2017-10-21 DIAGNOSIS — M25571 Pain in right ankle and joints of right foot: Secondary | ICD-10-CM | POA: Diagnosis not present

## 2017-10-21 DIAGNOSIS — Z969 Presence of functional implant, unspecified: Secondary | ICD-10-CM

## 2017-10-21 DIAGNOSIS — L97511 Non-pressure chronic ulcer of other part of right foot limited to breakdown of skin: Secondary | ICD-10-CM | POA: Diagnosis not present

## 2017-10-21 MED ORDER — ERYTHROMYCIN 2 % EX OINT: TOPICAL_OINTMENT | CUTANEOUS | 0 refills | Status: DC

## 2017-10-21 NOTE — Progress Notes (Addendum)
  Subjective:  Patient ID: [name], female    DOB: [birth year],  MRN: [id number]  Chief Complaint  Patient presents with  . Foot Ulcer    right foot   51 y.o. female returns for the above complaint.  States that she has a new ulceration around the other area as her previous one.  Still very itchy.  Objective:  There were no vitals filed for this visit. General AA&O x3. Normal mood and affect.  Vascular Pedal pulses palpable.  Neurologic Epicritic sensation grossly intact.  Dermatologic  plantar midfoot and hindfoot lesion.  Not actively draining.  No erythema.  Orthopedic: No pain to palpation either foot.   Assessment & Plan:  Patient was evaluated and treated and all questions answered.  Right foot ulceration -Possible bacterial component.  Rx erythromycin -Continue local wound care  Retained orthopedic hardware -We will plan for removal once wounds completely healed  F/u in 2 weeks

## 2017-10-22 MED ORDER — ERYTHROMYCIN 2 % EX GEL: CUTANEOUS | 0 refills | Status: DC

## 2017-10-22 NOTE — Telephone Encounter (Signed)
Erythromycin gel escribed to [facility].

## 2017-10-22 NOTE — Addendum Note (Signed)
Addended by: Harriett Sine D on: 10/22/2017 03:08 PM   Modules accepted: Orders

## 2017-10-22 NOTE — Telephone Encounter (Signed)
This is one of the pharmacists calling from [facility] on [address] [employer]. We received a prescription for erythromycin 2% ointment however that does not come in an ointment formulation. There is a gel available, but we need permission to get it changed to the gel. If you could please call us back at [phone]. Thank you and be well.

## 2017-11-04 ENCOUNTER — Ambulatory Visit (INDEPENDENT_AMBULATORY_CARE_PROVIDER_SITE_OTHER): Payer: Medicare Other | Admitting: Podiatry

## 2017-11-04 DIAGNOSIS — Z969 Presence of functional implant, unspecified: Secondary | ICD-10-CM | POA: Diagnosis not present

## 2017-11-04 DIAGNOSIS — L97511 Non-pressure chronic ulcer of other part of right foot limited to breakdown of skin: Secondary | ICD-10-CM | POA: Diagnosis not present

## 2017-11-15 NOTE — Progress Notes (Signed)
  Subjective:  Patient ID: [name], female    DOB: [birth year],  MRN: [id number]  51 y.o. female returns for the above complaint.  States the right foot is doing a little better.  States that both feet are hurting her.  Denies new issues.  Is awaiting healing at this wound for removal of her hardware to the right foot.  Objective:  There were no vitals filed for this visit. General AA&O x3. Normal mood and affect.  Vascular Pedal pulses palpable.  Neurologic Epicritic sensation grossly intact.  Dermatologic  plantar midfoot and lateral hindfoot lesions.  Vesicular nature . skin normal texture and turgor.   Orthopedic: No pain to palpation either foot.   Assessment & Plan:  Patient was evaluated and treated and all questions answered.  Right foot ulceration -Time to heal.  Continue Intermedic ointment and Band-Aids  Retained orthopedic hardware -We will discuss removal once the right foot ulcerations completely healed  Return in about 2 weeks (around 11/18/2017).

## 2017-11-17 ENCOUNTER — Ambulatory Visit (HOSPITAL_COMMUNITY)
Admission: RE | Admit: 2017-11-17 | Discharge: 2017-11-17 | Disposition: A | Discharge status: Home / Self Care | Payer: Medicare Other | Source: Ambulatory Visit | Attending: Internal Medicine | Admitting: Internal Medicine

## 2017-11-17 ENCOUNTER — Encounter: Payer: Self-pay | Admitting: Internal Medicine

## 2017-11-17 ENCOUNTER — Ambulatory Visit (INDEPENDENT_AMBULATORY_CARE_PROVIDER_SITE_OTHER): Payer: Medicare Other | Admitting: Internal Medicine

## 2017-11-17 VITALS — BP 136/90 | HR 74 | Temp 97.9°F | Ht 66.0 in | Wt 251.2 lb

## 2017-11-17 DIAGNOSIS — R079 Chest pain, unspecified: Secondary | ICD-10-CM

## 2017-11-17 DIAGNOSIS — F17200 Nicotine dependence, unspecified, uncomplicated: Secondary | ICD-10-CM

## 2017-11-17 DIAGNOSIS — I10 Essential (primary) hypertension: Secondary | ICD-10-CM

## 2017-11-17 DIAGNOSIS — R002 Palpitations: Secondary | ICD-10-CM | POA: Diagnosis not present

## 2017-11-17 DIAGNOSIS — F17210 Nicotine dependence, cigarettes, uncomplicated: Secondary | ICD-10-CM

## 2017-11-17 DIAGNOSIS — R51 Headache: Secondary | ICD-10-CM

## 2017-11-17 DIAGNOSIS — E042 Nontoxic multinodular goiter: Secondary | ICD-10-CM | POA: Diagnosis not present

## 2017-11-17 DIAGNOSIS — Z6841 Body Mass Index (BMI) 40.0 and over, adult: Secondary | ICD-10-CM

## 2017-11-17 MED ORDER — HYDROCHLOROTHIAZIDE 12.5 MG PO TABS: 12.5000 mg | ORAL_TABLET | Freq: Every day | ORAL | 2 refills | Status: DC

## 2017-11-17 NOTE — Assessment & Plan Note (Signed)
Encouraged cessation. Applauded reduction. Patient received approximately 5-10 minutes of counseling regarding this. Discussed nicotine patches/gum or medications however not interested.

## 2017-11-17 NOTE — Assessment & Plan Note (Signed)
>  6 month history of occasional feelings that her heart is racing out of her chest and a "funny feeling." Progressed so that for the past month or so she's been experiencing this daily. Feels a "kick" when it starts and resolves spontaneously after several minutes. No particular trigger identified per patient. Her history is concerning for possible Atrial Fibrillation. She had regular rate and rhythm during exam and was without murmur. EKG showed NSR here in clinic while she was not experiencing the symptoms.  -EKG performed, NSR -48-hr Holter monitor ordered. Patient to inform me if palpitations do not occur while wearing the monitor so a 30-day monitor can be ordered.  -Will order TSH and Free T4 given her history of multinodular goiter -If pt does have AFib, will need ECHO to evaluate for a valvular cause and of course will need to evaluate her need for anticoagulation and rate control

## 2017-11-17 NOTE — Progress Notes (Signed)
Internal Medicine Clinic Attending  Case discussed with Dr. Molt at the time of the visit.  We reviewed the resident's history and exam and pertinent patient test results.  I agree with the assessment, diagnosis, and plan of care documented in the resident's note. 

## 2017-11-17 NOTE — Assessment & Plan Note (Addendum)
[name] was sent here from her dentists office for hypertension (151/111, 172/106). Chart review shows history of HTN previously treated with atenolol in 2012 however has been off this for several years. Chart review shows uncontrolled hypertension for some time. Lifestyle modification including lower-sodium diet and exercise/weight loss was recommended previously however unable to comply. She also is a current smoker however cutting back.   Patient is not diabetic nor has CKD however believe its prudent to get better control of her blood pressure to reduce complications associated with HTN. -Renal function wnl  -Start HCTZ 12.5mg  daily -Patient to RTC 1 month for PCP follow-up and BP recheck

## 2017-11-17 NOTE — Progress Notes (Signed)
   CC: follow-up of HTN, palpitations for a few months  HPI:  [name] is a 51 y.o. F with HTN, morbid obesity and current tobacco abuse sent here from her dentists office due to hypertension. BP recorded as 151/111 and 172/106 while there and was unable to have a tooth extracted. Patient reports she was on Atenolol in the past and did very well with it. Denied any weakness, numbness, tingling or vision changes although has had headaches intermittently. She also reports episodes of her heart racing and a "funny feeling" in her chest. This is also associated with a "kick" as well. Episodes have been going on for over 6 months however have become more frequent lately. She is now having episodes daily. No specific trigger or remitting factor appreciated. Lasts several minutes and stops on its own.   Past Medical History:  Diagnosis Date  . Anemia   . Fibroids   . Hypertension   . Hypertension    no current meds for htn  . Knee joint injury   . Thyroid goiter    Review of Systems:   General: Denies fevers, weight loss HEENT: +Headaches. Denies changes in vision, sore throat, dysphagia Cardiac: +CP, palpitations. Denied SOB Pulmonary: Denies cough, PND Abd: Denies diarrhea, changes in bowels Extremities: +LE swelling. Denies weakness  Physical Exam: General: Alert, in no acute distress. Pleasant and conversant HEENT: No icterus, injection or ptosis. No hoarseness or dysarthria  Cardiac: RRR, no MGR appreciated. No chest wall tenderness Pulmonary: Diminished but clear BL. No wheezing or rales. Able to speak in complete sentences Abd: Soft, non-tended. +bs Extremities: +Trace pitting edema to shin BL. Warm, perfused.    Vitals:   11/17/17 1337  BP: 136/90  Pulse: 74  Temp: 97.9 F (36.6 C)  TempSrc: Oral  SpO2: 100%  Weight: 251 lb 3.2 oz (113.9 kg)  Height: 5\' 6"  (1.676 m)   Assessment & Plan:   See Encounters Tab for problem based charting.  Patient discussed with  Dr. Angelia Mould

## 2017-11-17 NOTE — Patient Instructions (Signed)
It was a pleasure meeting you today! I'm sorry you are having issues with your blood pressure and heart racing.   Your blood pressure has been a little elevated for some time. It's important to get good control of this to reduce your risk of kidney disease, stroke and heart attack. For your hypertension, I'm starting a medication called Hydrochlorothiazide (HCTZ for short). You will take 1 pill (12.5mg ) once daily.   For your heart racing, this could be due to a condition called Atrial Fibrillation ("AFib"). This is where the top part of your heart beats irregularly. You had a normal rhythm today however in order to diagnose this I need to catch it on a monitor. I am prescribing a 48-hour holter monitor for you to wear. Since you have the symptoms daily it should catch it. Please let us know if you do not experience the fast heart beat while wearing this, as we will need to order one for longer.   I am checking your thyroid function again today, as this can be associated with AFib and you have history of a thryoid issue.   I would like you to follow-up in clinic in 1 month so we can check your blood pressure and go over results!

## 2017-11-18 ENCOUNTER — Encounter: Payer: Self-pay | Admitting: Podiatry

## 2017-11-18 ENCOUNTER — Ambulatory Visit (INDEPENDENT_AMBULATORY_CARE_PROVIDER_SITE_OTHER): Payer: Medicare Other | Admitting: Podiatry

## 2017-11-18 DIAGNOSIS — R238 Other skin changes: Secondary | ICD-10-CM | POA: Diagnosis not present

## 2017-11-18 DIAGNOSIS — Z969 Presence of functional implant, unspecified: Secondary | ICD-10-CM | POA: Diagnosis not present

## 2017-11-18 DIAGNOSIS — L309 Dermatitis, unspecified: Secondary | ICD-10-CM

## 2017-11-18 DIAGNOSIS — L97511 Non-pressure chronic ulcer of other part of right foot limited to breakdown of skin: Secondary | ICD-10-CM | POA: Diagnosis not present

## 2017-11-18 DIAGNOSIS — L858 Other specified epidermal thickening: Secondary | ICD-10-CM | POA: Diagnosis not present

## 2017-11-18 LAB — TSH: TSH: 0.982 u[IU]/mL (ref 0.450–4.500)

## 2017-11-18 LAB — T4, FREE: Free T4: 1.1 ng/dL (ref 0.82–1.77)

## 2017-11-22 NOTE — Progress Notes (Signed)
  Subjective:  Patient ID: [name], female    DOB: December 27, 1965,  MRN: [id number]  Chief Complaint  Patient presents with  . Foot Problem    i am frustrated with the right foot and he has scraped on it and it still comes back   51 y.o. female returns for the above complaint.  States that she is frustrated that her lesions to the bottom of her foot has not healed  Objective:  There were no vitals filed for this visit. General AA&O x3. Normal mood and affect.  Vascular Pedal pulses palpable.  Neurologic Epicritic sensation grossly intact.  Dermatologic  right plantar foot lesions x3 with scaling scaling and pustules noted  Orthopedic: No pain to palpation either foot.   Assessment & Plan:  Patient was evaluated and treated and all questions answered.  ? Bacterial infection to the plantar feet -Skin scrapings taken and sent for pathology  Retained hardware -We will discuss removal once lesions completely healed  Return in about 2 weeks (around 12/02/2017) for Wound Care.

## 2017-12-03 ENCOUNTER — Ambulatory Visit: Payer: Medicare Other | Admitting: Podiatry

## 2017-12-08 ENCOUNTER — Encounter: Payer: Self-pay | Admitting: Internal Medicine

## 2017-12-08 ENCOUNTER — Other Ambulatory Visit: Payer: Self-pay

## 2017-12-08 ENCOUNTER — Ambulatory Visit (INDEPENDENT_AMBULATORY_CARE_PROVIDER_SITE_OTHER): Payer: Medicare Other | Admitting: Internal Medicine

## 2017-12-08 VITALS — BP 154/92 | HR 80 | Temp 98.2°F | Ht 66.0 in | Wt 256.8 lb

## 2017-12-08 DIAGNOSIS — F17210 Nicotine dependence, cigarettes, uncomplicated: Secondary | ICD-10-CM | POA: Diagnosis not present

## 2017-12-08 DIAGNOSIS — R002 Palpitations: Secondary | ICD-10-CM

## 2017-12-08 DIAGNOSIS — I10 Essential (primary) hypertension: Secondary | ICD-10-CM

## 2017-12-08 MED ORDER — ATENOLOL 50 MG PO TABS: 50.0000 mg | ORAL_TABLET | Freq: Every day | ORAL | 1 refills | Status: DC

## 2017-12-08 NOTE — Progress Notes (Signed)
   CC: follow-up of palpitations and hypertension   HPI:  [name] is a 51 y.o. F here for follow-up of palpiations and high blood pressure. She was seen last month with a 6 month history of palpitations and a 'fluttering/kicking" feeling in her chest. EKG done in clinic was NSR although she was asymptomatic at that time. A 48-hr holter monitor was ordered however has not been completed. In regards to her high blood pressures to was started on HCTZ 12.5 mg one month ago. She stopped taking this medication 2 weeks ago as it made her dizzy and weak.  Past Medical History:  Diagnosis Date  . Anemia   . Fibroids   . Hypertension   . Hypertension    no current meds for htn  . Knee joint injury   . Thyroid goiter    Review of Systems:   General: +fatigue. Denies fevers, chills, weight loss HEENT: Denies changes in vision, sore throat Cardiac: +Palpitations. +SOB. Pulmonary: Denies cough, orthopnea, PND Abd: Denies changes in bowels  Physical Exam: General: Alert, in no acute distress. Pleasant and conversant HEENT: No icterus, injection or ptosis. No hoarseness or dysarthria  Cardiac: RRR, no MGR appreciated Pulmonary: CTA BL with normal WOB on RA. Able to speak in complete sentences Abd: Soft, non-tender. +bs Extremities: Warm, perfused. No significant pedal edema.   Vitals:   12/08/17 1505  BP: (!) 154/92  Pulse: 80  Temp: 98.2 F (36.8 C)  TempSrc: Oral  SpO2: 100%  Weight: 256 lb 12.8 oz (116.5 kg)  Height: 5\' 6"  (1.676 m)   Assessment & Plan:   See Encounters Tab for problem based charting.  Patient discussed with Dr. Evette Doffing

## 2017-12-09 NOTE — Assessment & Plan Note (Signed)
Started on HCTZ one month ago however stopped taking 2 weeks and as it made her feel weak and dizzy. She is hypertensive today. She requests to go back on a blood pressure medication that worked well for her in the past, atenolol. -Start atenolol 50 mg daily -Patient will return to clinic in 1 month for follow-up

## 2017-12-09 NOTE — Assessment & Plan Note (Signed)
She continues to experience palpitations multiple times daily. These are associated with a fluttering feeling in her chest and shortness of breath. She has no chest pain or acute neurologic symptoms. She had yet to complete the 48 hour Holter monitor. -We will able to schedule her appointment for the Holter monitor for later this week -Instructed patient to let me know immediately if she does not experience these symptoms while wearing the monitor and we can order a longer course.

## 2017-12-11 NOTE — Progress Notes (Signed)
Internal Medicine Clinic Attending  Case discussed with Dr. Molt at the time of the visit.  We reviewed the resident's history and exam and pertinent patient test results.  I agree with the assessment, diagnosis, and plan of care documented in the resident's note. 

## 2017-12-24 ENCOUNTER — Telehealth: Payer: Self-pay | Admitting: *Deleted

## 2017-12-24 ENCOUNTER — Other Ambulatory Visit: Payer: Self-pay | Admitting: Internal Medicine

## 2017-12-24 NOTE — Telephone Encounter (Signed)
Pt called for refill of Voltaren 1% Gel. Pt was prescribed Voltaren gel was prescribed by DR. Vahini Chundi. Unable to leave a message on mobile pt was not available. Unable to leave a message on pt's listed home phone, was informed pt does not live there.

## 2017-12-29 DIAGNOSIS — L303 Infective dermatitis: Secondary | ICD-10-CM | POA: Diagnosis not present

## 2017-12-29 DIAGNOSIS — L818 Other specified disorders of pigmentation: Secondary | ICD-10-CM | POA: Diagnosis not present

## 2017-12-29 DIAGNOSIS — L810 Postinflammatory hyperpigmentation: Secondary | ICD-10-CM | POA: Diagnosis not present

## 2017-12-29 DIAGNOSIS — L308 Other specified dermatitis: Secondary | ICD-10-CM | POA: Diagnosis not present

## 2017-12-29 DIAGNOSIS — D2371 Other benign neoplasm of skin of right lower limb, including hip: Secondary | ICD-10-CM | POA: Diagnosis not present

## 2017-12-30 ENCOUNTER — Ambulatory Visit: Payer: Medicare Other | Admitting: Podiatry

## 2018-01-05 DIAGNOSIS — L303 Infective dermatitis: Secondary | ICD-10-CM | POA: Diagnosis not present

## 2018-01-07 ENCOUNTER — Ambulatory Visit: Payer: Medicare Other

## 2018-01-07 DIAGNOSIS — R002 Palpitations: Secondary | ICD-10-CM

## 2018-01-13 ENCOUNTER — Encounter: Payer: Self-pay | Admitting: Internal Medicine

## 2018-01-13 NOTE — Assessment & Plan Note (Deleted)
48-hr holter monitor completed 1 week ago, read is not yet back.

## 2018-01-13 NOTE — Assessment & Plan Note (Deleted)
Patient was re-started on Atenolol 50mg  daily at our last visit 1 month ago. She was feeling weak and dizzy with HCTZ and requested to be placed on Atenolol as it had worked well for her several years ago.

## 2018-01-13 NOTE — Progress Notes (Deleted)
   CC: follow-up of palpitations, hypertension  HPI:  [name] is a 52 y.o. F with history of hypertension, hx of multinodular goiter following with ENT, tobacco use and intermittent palpitations here for follow-up of these conditions. Please see problem-based charting for details of her medical issues.   Past Medical History:  Diagnosis Date  . Anemia   . Fibroids   . Hypertension   . Hypertension    no current meds for htn  . Knee joint injury   . Thyroid goiter    Review of Systems:   General: Denies fevers, chills, weight loss, fatigue HEENT: Denies changes in vision, sore throat, dysphagia Cardiac: Denies CP, SOB, palpitations Pulmonary: Denies cough, wheezes, PND Abd: Denies diarrhea, constipation, changes in bowels Extremities: Denies weakness or swelling  Physical Exam: General: Alert, in no acute distress. Pleasant and conversant HEENT: No icterus, injection or ptosis. No hoarseness or dysarthria  Cardiac: RRR, no MGR appreciated Pulmonary: CTA BL with normal WOB on RA. Able to speak in complete sentences Abd: Soft, non-tender. +bs Extremities: Warm, perfused. No significant pedal edema.    There were no vitals filed for this visit. ***  Assessment & Plan:   See Encounters Tab for problem based charting.  Patient {GC/GE:[id number]::"discussed with","seen with"} Dr. {NAMES:[id number]::"Butcher","Granfortuna","E. Hoffman","Klima","Mullen","Narendra","Raines","Vincent"}

## 2018-01-19 ENCOUNTER — Encounter (HOSPITAL_COMMUNITY): Payer: Self-pay | Admitting: *Deleted

## 2018-01-19 ENCOUNTER — Other Ambulatory Visit: Payer: Self-pay

## 2018-01-19 ENCOUNTER — Emergency Department (HOSPITAL_COMMUNITY)
Admission: EM | Admit: 2018-01-19 | Discharge: 2018-01-19 | Disposition: A | Discharge status: Home / Self Care | Payer: Medicare Other | Attending: Emergency Medicine | Admitting: Emergency Medicine

## 2018-01-19 DIAGNOSIS — Z79899 Other long term (current) drug therapy: Secondary | ICD-10-CM | POA: Insufficient documentation

## 2018-01-19 DIAGNOSIS — F17210 Nicotine dependence, cigarettes, uncomplicated: Secondary | ICD-10-CM | POA: Insufficient documentation

## 2018-01-19 DIAGNOSIS — K0889 Other specified disorders of teeth and supporting structures: Secondary | ICD-10-CM | POA: Diagnosis not present

## 2018-01-19 DIAGNOSIS — I10 Essential (primary) hypertension: Secondary | ICD-10-CM | POA: Diagnosis not present

## 2018-01-19 MED ORDER — PENICILLIN V POTASSIUM 500 MG PO TABS: 500.0000 mg | ORAL_TABLET | Freq: Four times a day (QID) | ORAL | 0 refills | Status: AC

## 2018-01-19 MED ORDER — LIDOCAINE VISCOUS 2 % MT SOLN: 15.0000 mL | OROMUCOSAL | 0 refills | Status: DC | PRN

## 2018-01-19 NOTE — ED Provider Notes (Signed)
[name] Provider Note   CSN: [id number] Arrival date & time: 01/19/18  0945     History   Chief Complaint Chief Complaint  Patient presents with  . Dental Pain    HPI [name] is a 52 y.o. female past medical history of hypertension, who presents to ED for evaluation of 1 month history of intermittent, sharp right upper dental pain that has gotten progressively worse over the past week.  She was seen and evaluated by her dentist last week and was told that she did need to see an oral surgeon for possible removal of tooth.  However, her dentist did not refer her to a Psychologist, sport and exercise.  She is here because of the pain and she is wanting referral to an oral surgeon and possibly "taking out the tooth here in the emergency room."  She has been applying Orajel with no improvement in her symptoms.  She denies any swelling to face, trauma to area, discharge or bleeding from tooth, trouble breathing, trouble swallowing, sore throat, neck pain, rashes.  HPI  Past Medical History:  Diagnosis Date  . Anemia   . Fibroids   . Hypertension   . Hypertension    no current meds for htn  . Knee joint injury   . Thyroid goiter     Patient Active Problem List   Diagnosis Date Noted  . Intermittent palpitations 11/17/2017  . Menopause 06/23/2017  . Hot flashes 09/23/2016  . Healthcare maintenance 09/23/2016  . S/P total knee arthroplasty, 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  . Uterine fibroid 05/09/2013  . Tobacco use disorder 04/12/2013  . OA (osteoarthritis) 10/28/2011  . 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 MORBID OBESITY 09/17/2009  . Essential hypertension 09/17/2009  . Pain in limb 09/17/2009  . ANEMIA, HX OF 09/17/2009    Past Surgical History:  Procedure Laterality Date  . ANKLE SURGERY Right 2009   broken ankle   . arthroscopic surgery to knee Bilateral   . CESAREAN SECTION    . HAND SURGERY    . JOINT REPLACEMENT Bilateral    knees  . REPLACEMENT  TOTAL KNEE Right 2012  . TOTAL KNEE ARTHROPLASTY Left 01/03/2014   Procedure: LEFT TOTAL KNEE ARTHROPLASTY;  Surgeon: Tobi Bastos, MD;  Location: [facility];  Service: Orthopedics;  Laterality: Left;  . TUBAL LIGATION      OB History    Gravida Para Term Preterm AB Living   4 2 2  0 2 2   SAB TAB Ectopic Multiple Live Births   1 1 0   2       Home Medications    Prior to Admission medications   Medication Sig Start Date End Date Taking? Authorizing Provider  atenolol (TENORMIN) 50 MG tablet Take 1 tablet (50 mg total) by mouth daily. 12/08/17 02/06/18  Molt, Bethany, DO  baclofen (LIORESAL) 10 MG tablet Take 1 tablet (10 mg total) by mouth 3 (three) times daily. 10/11/17   Margarita Mail, PA-C  celecoxib (CELEBREX) 200 MG capsule Take 1 capsule (200 mg total) by mouth 2 (two) times daily. 10/11/17   Margarita Mail, PA-C  diclofenac sodium (VOLTAREN) 1 % GEL Apply 2 g topically 4 (four) times daily. 10/14/17   Chundi, Verne Spurr, MD  erythromycin with ethanol (EMGEL) 2 % gel Apply to affected area 2 times daily. 10/22/17   Evelina Bucy, DPM  lidocaine (XYLOCAINE) 2 % solution Use as directed 15 mLs in the mouth or throat as needed for mouth  pain. 01/19/18   Johntavius Shepard, PA-C  penicillin v potassium (VEETID) 500 MG tablet Take 1 tablet (500 mg total) by mouth 4 (four) times daily for 7 days. 01/19/18 01/26/18  Delia Heady, PA-C    Family History Family History  Problem Relation Age of Onset  . Hypertension Mother   . Hypertension Father   . Cancer Maternal Grandmother        ? lung    Social History Social History   Tobacco Use  . Smoking status: Current Every Day Smoker    Packs/day: 0.20    Years: 0.00    Pack years: 0.00    Types: Cigarettes  . Smokeless tobacco: Never Used  . Tobacco comment: 1-2  cigs per day  Substance Use Topics  . Alcohol use: Yes    Alcohol/week: 1.0 oz    Types: 2 Standard drinks or equivalent per week    Comment: occasionally once per month    . Drug use: No     Allergies   Tramadol; Ibuprofen; and Metronidazole   Review of Systems Review of Systems  Constitutional: Negative for chills and fever.  HENT: Positive for dental problem. Negative for congestion, drooling, postnasal drip, rhinorrhea, sore throat, tinnitus, trouble swallowing and voice change.   Gastrointestinal: Negative for nausea and vomiting.  Musculoskeletal: Negative for neck pain and neck stiffness.  Skin: Negative for rash and wound.     Physical Exam Updated Vital Signs BP (!) 152/94 (BP Location: Right Arm)   Pulse 66   Temp 97.7 F (36.5 C) (Oral)   Resp 16   Ht 5\' 6"  (1.676 m)   Wt 106.6 kg (235 lb)   SpO2 100%   BMI 37.93 kg/m   Physical Exam  Constitutional: She appears well-developed and well-nourished. No distress.  Nontoxic appearing and in no acute distress.  HENT:  Head: Normocephalic and atraumatic.  Mouth/Throat: Uvula is midline, oropharynx is clear and moist and mucous membranes are normal. She does not have dentures. No oral lesions. No trismus in the jaw. Abnormal dentition. Dental caries present. No dental abscesses, uvula swelling or lacerations. No tonsillar exudate.    Tenderness to palpation surrounding right upper molar as indicated in the image.  There is a dental caries noted and overall poor dentition with several missing teeth.  No facial, neck or cheek swelling noted. No pooling of secretions or trismus.  Normal voice noted with no difficulty swallowing or breathing.  No submandibular swelling, crepitus noted.  Eyes: Conjunctivae and EOM are normal. No scleral icterus.  Neck: Normal range of motion.  Pulmonary/Chest: Effort normal. No respiratory distress.  Neurological: She is alert.  Skin: No rash noted. She is not diaphoretic.  Psychiatric: She has a normal mood and affect.  Nursing note and vitals reviewed.    ED Treatments / Results  Labs (all labs ordered are listed, but only abnormal results are  displayed) Labs Reviewed - No data to display  EKG  EKG Interpretation None       Radiology No results found.  Procedures Procedures (including critical care time)  Medications Ordered in ED Medications - No data to display   Initial Impression / Assessment and Plan / ED Course  I have reviewed the triage vital signs and the nursing notes.  Pertinent labs & imaging results that were available during my care of the patient were reviewed by me and considered in my medical decision making (see chart for details).     Patient presents to  ED for evaluation of right upper dental pain that has been intermittent for the past month but has been getting progressively worse for the past week.  On physical exam she is overall well-appearing with no trismus, drooling, signs of airway compromise or signs of Ludwig's angina or other acute deep tissue infection of the neck.  She is afebrile here.  There is overall poor dentition and dental Cary noted in the right upper molar.  Posterior oropharynx and tonsils appear unremarkable.  Will treat as early dental infection.  Will give penicillin, lidocaine solution to help with irritation as well as referral to oral surgeon that is on call.  Patient declines dental block at this time.  Patient appears stable for discharge at this time.  Strict return precautions given.  Final Clinical Impressions(s) / ED Diagnoses   Final diagnoses:  [place]    ED Discharge Orders        Ordered    penicillin v potassium (VEETID) 500 MG tablet  4 times daily     01/19/18 1103    lidocaine (XYLOCAINE) 2 % solution  As needed     01/19/18 1103     Portions of this note were generated with Dragon dictation software. Dictation errors may occur despite best attempts at proofreading.    Delia Heady, PA-C 01/19/18 1109    Little, Wenda Overland, MD 01/22/18 [phone]

## 2018-01-19 NOTE — ED Notes (Signed)
Patient verbalized understanding of discharge instructions and denies any further needs or questions at this time. VS stable. Patient ambulatory with steady gait.  

## 2018-01-19 NOTE — ED Triage Notes (Signed)
Pt is here with right upper dental pain and she has a hole in her tooth.  She said she needs and oral surgeon.

## 2018-01-19 NOTE — ED Notes (Signed)
Patient called [name] [place] office - they will see her at 12:45pm today.

## 2018-01-27 ENCOUNTER — Telehealth: Payer: Self-pay | Admitting: *Deleted

## 2018-01-27 NOTE — Telephone Encounter (Signed)
Busy

## 2018-02-11 ENCOUNTER — Telehealth: Payer: Self-pay | Admitting: *Deleted

## 2018-02-11 NOTE — Telephone Encounter (Signed)
Trying to contact patient to request return of [employer] Holter Monitor.  Telephone number given not working.

## 2018-02-26 DIAGNOSIS — L303 Infective dermatitis: Secondary | ICD-10-CM | POA: Diagnosis not present

## 2018-02-26 DIAGNOSIS — M79672 Pain in left foot: Secondary | ICD-10-CM | POA: Diagnosis not present

## 2018-02-26 DIAGNOSIS — B079 Viral wart, unspecified: Secondary | ICD-10-CM | POA: Diagnosis not present

## 2018-03-02 DIAGNOSIS — L259 Unspecified contact dermatitis, unspecified cause: Secondary | ICD-10-CM | POA: Diagnosis not present

## 2018-03-17 ENCOUNTER — Emergency Department (HOSPITAL_COMMUNITY)
Admission: EM | Admit: 2018-03-17 | Discharge: 2018-03-17 | Disposition: A | Discharge status: Home / Self Care | Payer: Medicare Other | Attending: Emergency Medicine | Admitting: Emergency Medicine

## 2018-03-17 ENCOUNTER — Other Ambulatory Visit: Payer: Self-pay

## 2018-03-17 ENCOUNTER — Encounter (HOSPITAL_COMMUNITY): Payer: Self-pay

## 2018-03-17 DIAGNOSIS — I10 Essential (primary) hypertension: Secondary | ICD-10-CM | POA: Insufficient documentation

## 2018-03-17 DIAGNOSIS — F17210 Nicotine dependence, cigarettes, uncomplicated: Secondary | ICD-10-CM | POA: Insufficient documentation

## 2018-03-17 DIAGNOSIS — R05 Cough: Secondary | ICD-10-CM | POA: Diagnosis not present

## 2018-03-17 DIAGNOSIS — Z79899 Other long term (current) drug therapy: Secondary | ICD-10-CM | POA: Insufficient documentation

## 2018-03-17 DIAGNOSIS — B9789 Other viral agents as the cause of diseases classified elsewhere: Secondary | ICD-10-CM

## 2018-03-17 DIAGNOSIS — J029 Acute pharyngitis, unspecified: Secondary | ICD-10-CM | POA: Diagnosis present

## 2018-03-17 DIAGNOSIS — J069 Acute upper respiratory infection, unspecified: Secondary | ICD-10-CM | POA: Diagnosis not present

## 2018-03-17 DIAGNOSIS — R030 Elevated blood-pressure reading, without diagnosis of hypertension: Secondary | ICD-10-CM

## 2018-03-17 LAB — RAPID STREP SCREEN (MED CTR MEBANE ONLY): Streptococcus, Group A Screen (Direct): NEGATIVE

## 2018-03-17 MED ORDER — LIDOCAINE VISCOUS 2 % MT SOLN: 15.0000 mL | Freq: Four times a day (QID) | OROMUCOSAL | 0 refills | Status: DC | PRN

## 2018-03-17 MED ORDER — LIDOCAINE VISCOUS 2 % MT SOLN
20.0000 mL | Freq: Once | OROMUCOSAL | Status: AC
  Administered 2018-03-17: 20 mL via OROMUCOSAL
  Filled 2018-03-17: qty 30

## 2018-03-17 MED ORDER — PREDNISONE 50 MG PO TABS: ORAL_TABLET | ORAL | 0 refills | Status: DC

## 2018-03-17 NOTE — ED Triage Notes (Signed)
Patient complains of throat pain x 2 days, states pain with swallowing. NAd

## 2018-03-17 NOTE — Discharge Instructions (Signed)
Please follow with your primary care doctor in the next 5 days for high blood pressure evaluation. If you do not have a primary care doctor, present to urgent care. Reduce salt intake. Seek emergency medical care for unilateral weakness, slurring, change in vision, or chest pain and shortness of breath. ° °Please follow with your primary care doctor in the next 2 days for a check-up. They must obtain records for further management.  ° °Do not hesitate to return to the Emergency Department for any new, worsening or concerning symptoms.  ° ° °

## 2018-03-17 NOTE — ED Provider Notes (Signed)
[facility] EMERGENCY DEPARTMENT Provider Note   CSN: [id number] Arrival date & time: 03/17/18  0850     History   Chief Complaint CC: sore throat  HPI   Blood pressure (!) 151/98, pulse 69, temperature 97.8 F (36.6 C), temperature source Oral, resp. rate 16, SpO2 97 %.  [name] is a 51 y.o. female with past medical history significant for hypertension complaining of dry cough, left ear pain and severe sore throat onset x2days ago, no pain medications taken prior to arrival.  She works with food.  She denies any headache, myalgia, fever chills.  A mild dry cough on review of systems.  Past Medical History:  Diagnosis Date  . Anemia   . Fibroids   . Hypertension   . Hypertension    no current meds for htn  . Knee joint injury   . Thyroid goiter     Patient Active Problem List   Diagnosis Date Noted  . Intermittent palpitations 11/17/2017  . Menopause 06/23/2017  . Hot flashes 09/23/2016  . Healthcare maintenance 09/23/2016  . S/P total knee arthroplasty, 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  . Uterine fibroid 05/09/2013  . Tobacco use disorder 04/12/2013  . OA (osteoarthritis) 10/28/2011  . 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 MORBID OBESITY 09/17/2009  . Essential hypertension 09/17/2009  . Pain in limb 09/17/2009  . ANEMIA, HX OF 09/17/2009    Past Surgical History:  Procedure Laterality Date  . ANKLE SURGERY Right 2009   broken ankle   . arthroscopic surgery to knee Bilateral   . CESAREAN SECTION    . HAND SURGERY    . JOINT REPLACEMENT Bilateral    knees  . REPLACEMENT TOTAL KNEE Right 2012  . TOTAL KNEE ARTHROPLASTY Left 01/03/2014   Procedure: LEFT TOTAL KNEE ARTHROPLASTY;  Surgeon: Tobi Bastos, MD;  Location: [facility];  Service: Orthopedics;  Laterality: Left;  . TUBAL LIGATION       OB History    Gravida  4   Para  2   Term  2   Preterm  0   AB  2   Living  2     SAB  1   TAB  1   Ectopic  0   Multiple      Live  Births  2            Home Medications    Prior to Admission medications   Medication Sig Start Date End Date Taking? Authorizing Provider  atenolol (TENORMIN) 50 MG tablet Take 1 tablet (50 mg total) by mouth daily. 12/08/17 02/06/18  Molt, Bethany, DO  baclofen (LIORESAL) 10 MG tablet Take 1 tablet (10 mg total) by mouth 3 (three) times daily. 10/11/17   Margarita Mail, PA-C  celecoxib (CELEBREX) 200 MG capsule Take 1 capsule (200 mg total) by mouth 2 (two) times daily. 10/11/17   Margarita Mail, PA-C  erythromycin with ethanol (EMGEL) 2 % gel Apply to affected area 2 times daily. 10/22/17   Evelina Bucy, DPM  lidocaine (XYLOCAINE) 2 % solution Use as directed 15 mLs in the mouth or throat every 6 (six) hours as needed. 03/17/18   Jermiah Soderman, Elmyra Ricks, PA-C  predniSONE (DELTASONE) 50 MG tablet Take 1 tablet daily with breakfast 03/17/18   Areonna Bran, Elmyra Ricks, PA-C  VOLTAREN 1 % GEL APPLY 2GM TOPICALLY FOUR TIMES DAILY AS DIRECTED 01/25/18   Molt, Bethany, DO    Family History Family History  Problem Relation Age of Onset  .  Hypertension Mother   . Hypertension Father   . Cancer Maternal Grandmother        ? lung    Social History Social History   Tobacco Use  . Smoking status: Current Every Day Smoker    Packs/day: 0.20    Years: 0.00    Pack years: 0.00    Types: Cigarettes  . Smokeless tobacco: Never Used  . Tobacco comment: 1-2  cigs per day  Substance Use Topics  . Alcohol use: Yes    Alcohol/week: 1.0 oz    Types: 2 Standard drinks or equivalent per week    Comment: occasionally once per month  . Drug use: No     Allergies   Tramadol; Ibuprofen; and Metronidazole   Review of Systems Review of Systems  A complete review of systems was obtained and all systems are negative except as noted in the HPI and PMH.   Physical Exam Updated Vital Signs BP (!) 151/98 (BP Location: Right Arm)   Pulse 69   Temp 97.8 F (36.6 C) (Oral)   Resp 16   SpO2 97%    Physical Exam  Constitutional: She appears well-developed and well-nourished.  HENT:  Head: Normocephalic.  Right Ear: External ear normal.  Left Ear: External ear normal.  Mouth/Throat: Oropharynx is clear and moist. No oropharyngeal exudate.  No drooling or stridor. Posterior pharynx mildly erythematous no significant tonsillar hypertrophy. No exudate. Soft palate rises symmetrically. No TTP or induration under tongue.   No tenderness to palpation of frontal or bilateral maxillary sinuses.  Mild mucosal edema in the nares with scant rhinorrhea.  Bilateral tympanic membranes with normal architecture and good light reflex.    Eyes: Pupils are equal, round, and reactive to light. Conjunctivae and EOM are normal.  Neck: Normal range of motion. Neck supple.  Cardiovascular: Normal rate and regular rhythm.  Pulmonary/Chest: Effort normal and breath sounds normal. No stridor. No respiratory distress. She has no wheezes. She has no rales. She exhibits no tenderness.  Abdominal: Soft. There is no tenderness. There is no rebound and no guarding.  Nursing note and vitals reviewed.    ED Treatments / Results  Labs (all labs ordered are listed, but only abnormal results are displayed) Labs Reviewed  RAPID STREP SCREEN (NOT AT [facility])  CULTURE, GROUP A STREP [facility])    EKG None  Radiology No results found.  Procedures Procedures (including critical care time)  Medications Ordered in ED Medications  lidocaine (XYLOCAINE) 2 % viscous mouth solution 20 mL (has no administration in time range)     Initial Impression / Assessment and Plan / ED Course  I have reviewed the triage vital signs and the nursing notes.  Pertinent labs & imaging results that were available during my care of the patient were reviewed by me and considered in my medical decision making (see chart for details).     Vitals:   03/17/18 0901 03/17/18 1130  BP: (!) 171/100 (!) 151/98  Pulse: 79 69  Resp:  18 16  Temp: 97.6 F (36.4 C) 97.8 F (36.6 C)  TempSrc: Oral Oral  SpO2: 100% 97%    Medications  lidocaine (XYLOCAINE) 2 % viscous mouth solution 20 mL (has no administration in time range)    [name] is 52 y.o. female presenting with severe sore throat onset 2 days ago, physical exam is not consistent with the strep pharyngitis, rapid strep negative.  Pending culture.  Patient given viscous lidocaine, nondiabetic will start on  a short course of steroids for comfort.  She is allergic to ibuprofen.  Blood pressure today is elevated, advised to follow closely with internal medicine teaching service for recheck.    Evaluation does not show pathology that would require ongoing emergent intervention or inpatient treatment. Pt is hemodynamically stable and mentating appropriately. Discussed findings and plan with patient/guardian, who agrees with care plan. All questions answered. Return precautions discussed and outpatient follow up given.      Final Clinical Impressions(s) / ED Diagnoses   Final diagnoses:  Viral URI with cough  Elevated blood pressure reading    ED Discharge Orders        Ordered    lidocaine (XYLOCAINE) 2 % solution  Every 6 hours PRN     03/17/18 1128    predniSONE (DELTASONE) 50 MG tablet     03/17/18 1128       Adana Marik, Charna Elizabeth 03/17/18 Purvis, Dan, DO 03/17/18 1529

## 2018-03-19 LAB — CULTURE, GROUP A STREP (THRC)

## 2018-04-22 DIAGNOSIS — M25561 Pain in right knee: Secondary | ICD-10-CM | POA: Diagnosis not present

## 2018-04-22 DIAGNOSIS — M25562 Pain in left knee: Secondary | ICD-10-CM | POA: Diagnosis not present

## 2018-04-27 ENCOUNTER — Encounter: Payer: Medicare Other | Admitting: Internal Medicine

## 2018-10-17 ENCOUNTER — Encounter (HOSPITAL_COMMUNITY): Payer: Self-pay | Admitting: Emergency Medicine

## 2018-10-17 ENCOUNTER — Emergency Department (HOSPITAL_COMMUNITY): Payer: Medicare Other

## 2018-10-17 ENCOUNTER — Emergency Department (HOSPITAL_COMMUNITY)
Admission: EM | Admit: 2018-10-17 | Discharge: 2018-10-17 | Disposition: A | Discharge status: Home / Self Care | Payer: Medicare Other | Attending: Emergency Medicine | Admitting: Emergency Medicine

## 2018-10-17 ENCOUNTER — Other Ambulatory Visit: Payer: Self-pay

## 2018-10-17 DIAGNOSIS — F17210 Nicotine dependence, cigarettes, uncomplicated: Secondary | ICD-10-CM | POA: Diagnosis not present

## 2018-10-17 DIAGNOSIS — R0789 Other chest pain: Secondary | ICD-10-CM | POA: Diagnosis not present

## 2018-10-17 DIAGNOSIS — Z96653 Presence of artificial knee joint, bilateral: Secondary | ICD-10-CM | POA: Diagnosis not present

## 2018-10-17 DIAGNOSIS — Z79899 Other long term (current) drug therapy: Secondary | ICD-10-CM | POA: Diagnosis not present

## 2018-10-17 DIAGNOSIS — I10 Essential (primary) hypertension: Secondary | ICD-10-CM | POA: Insufficient documentation

## 2018-10-17 DIAGNOSIS — R079 Chest pain, unspecified: Secondary | ICD-10-CM | POA: Diagnosis not present

## 2018-10-17 LAB — I-STAT TROPONIN, ED: TROPONIN I, POC: 0 ng/mL (ref 0.00–0.08)

## 2018-10-17 LAB — BASIC METABOLIC PANEL
Anion gap: 9 (ref 5–15)
BUN: 20 mg/dL (ref 6–20)
CHLORIDE: 106 mmol/L (ref 98–111)
CO2: 23 mmol/L (ref 22–32)
CREATININE: 0.94 mg/dL (ref 0.44–1.00)
Calcium: 9.3 mg/dL (ref 8.9–10.3)
GFR calc Af Amer: >60 mL/min (ref >60)
Glucose, Bld: 93 mg/dL (ref 70–99)
Potassium: 4.5 mmol/L (ref 3.5–5.1)
SODIUM: 138 mmol/L (ref 135–145)

## 2018-10-17 LAB — CBC
HEMATOCRIT: 33 % — AB (ref 36.0–46.0)
Hemoglobin: 10 g/dL — ABNORMAL LOW (ref 12.0–15.0)
MCH: 25.4 pg — ABNORMAL LOW (ref 26.0–34.0)
MCHC: 30.3 g/dL (ref 30.0–36.0)
MCV: 83.8 fL (ref 80.0–100.0)
NRBC: 0 % (ref 0.0–0.2)
PLATELETS: 231 10*3/uL (ref 150–400)
RBC: 3.94 MIL/uL (ref 3.87–5.11)
RDW: 19.5 % — AB (ref 11.5–15.5)
WBC: 5.1 10*3/uL (ref 4.0–10.5)

## 2018-10-17 LAB — I-STAT BETA HCG BLOOD, ED (MC, WL, AP ONLY)

## 2018-10-17 MED ORDER — ACETAMINOPHEN 500 MG PO TABS
1000.0000 mg | ORAL_TABLET | Freq: Once | ORAL | Status: AC
  Administered 2018-10-17: 1000 mg via ORAL
  Filled 2018-10-17: qty 2

## 2018-10-17 NOTE — ED Triage Notes (Signed)
Pt. Stated, I started having chest pain for 2 weeks  and my left breast has been leaking for a month.

## 2018-10-17 NOTE — ED Provider Notes (Signed)
[place] EMERGENCY DEPARTMENT Provider Note   CSN: [id number] Arrival date & time: 10/17/18  1139     History   Chief Complaint Chief Complaint  Patient presents with  . Chest Pain  . Breast Pain    HPI [name] is a 52 y.o. female.  HPI Complains of left-sided chest pain rating to left scapula and left shoulder onset 2 weeks ago pain is constant worse with moving her left arm she also complains of left breast pain and discharge from her left nipple for the past 2 weeks.  She denies cough denies fever.  Chest pain and breast pain are nonexertional.  Constant.  Improved with remaining still worse with changing positions.  No treatment prior to coming here. Past Medical History:  Diagnosis Date  . Anemia   . Fibroids   . Hypertension   . Hypertension    no current meds for htn  . Knee joint injury   . Thyroid goiter     Patient Active Problem List   Diagnosis Date Noted  . Intermittent palpitations 11/17/2017  . Menopause 06/23/2017  . Hot flashes 09/23/2016  . Healthcare maintenance 09/23/2016  . S/P total knee arthroplasty, 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  . Uterine fibroid 05/09/2013  . Tobacco use disorder 04/12/2013  . OA (osteoarthritis) 10/28/2011  . 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 MORBID OBESITY 09/17/2009  . Essential hypertension 09/17/2009  . Pain in limb 09/17/2009  . ANEMIA, HX OF 09/17/2009    Past Surgical History:  Procedure Laterality Date  . ANKLE SURGERY Right 2009   broken ankle   . arthroscopic surgery to knee Bilateral   . CESAREAN SECTION    . HAND SURGERY    . JOINT REPLACEMENT Bilateral    knees  . REPLACEMENT TOTAL KNEE Right 2012  . TOTAL KNEE ARTHROPLASTY Left 01/03/2014   Procedure: LEFT TOTAL KNEE ARTHROPLASTY;  Surgeon: Tobi Bastos, MD;  Location: [facility];  Service: Orthopedics;  Laterality: Left;  . TUBAL LIGATION       OB History    Gravida  4   Para  2   Term  2   Preterm  0   AB  2   Living  2     SAB  1   TAB  1   Ectopic  0   Multiple      Live Births  2            Home Medications    Prior to Admission medications   Medication Sig Start Date End Date Taking? Authorizing Provider  atenolol (TENORMIN) 50 MG tablet Take 1 tablet (50 mg total) by mouth daily. 12/08/17 02/06/18  Molt, Bethany, DO  baclofen (LIORESAL) 10 MG tablet Take 1 tablet (10 mg total) by mouth 3 (three) times daily. 10/11/17   Margarita Mail, PA-C  celecoxib (CELEBREX) 200 MG capsule Take 1 capsule (200 mg total) by mouth 2 (two) times daily. 10/11/17   Margarita Mail, PA-C  erythromycin with ethanol (EMGEL) 2 % gel Apply to affected area 2 times daily. 10/22/17   Evelina Bucy, DPM  lidocaine (XYLOCAINE) 2 % solution Use as directed 15 mLs in the mouth or throat every 6 (six) hours as needed. 03/17/18   Pisciotta, Elmyra Ricks, PA-C  predniSONE (DELTASONE) 50 MG tablet Take 1 tablet daily with breakfast 03/17/18   Pisciotta, Elmyra Ricks, PA-C  VOLTAREN 1 % GEL APPLY 2GM TOPICALLY FOUR TIMES DAILY AS DIRECTED 01/25/18   Molt, Bethany, DO  Family History Family History  Problem Relation Age of Onset  . Hypertension Mother   . Hypertension Father   . Cancer Maternal Grandmother        ? lung  Father had stroke in late 38s  Social History Social History   Tobacco Use  . Smoking status: Current Every Day Smoker    Packs/day: 0.20    Years: 0.00    Pack years: 0.00    Types: Cigarettes  . Smokeless tobacco: Never Used  . Tobacco comment: 1-2  cigs per day  Substance Use Topics  . Alcohol use: Yes    Alcohol/week: 2.0 standard drinks    Types: 2 Standard drinks or equivalent per week    Comment: occasionally once per month  . Drug use: No     Allergies   Tramadol; Ibuprofen; and Metronidazole   Review of Systems Review of Systems  Constitutional: Negative.   HENT: Negative.   Respiratory: Negative.   Cardiovascular: Positive for chest pain.  Gastrointestinal: Negative.     Endocrine:       Left breast pain with discharge from left nipple  Musculoskeletal: Negative.   Skin: Negative.   Neurological: Negative.   Psychiatric/Behavioral: Negative.   All other systems reviewed and are negative.    Physical Exam Updated Vital Signs BP (!) 152/92   Pulse 67   Temp 98.8 F (37.1 C) (Oral)   Resp (!) 21   Ht 5\' 6"  (1.676 m)   Wt 104.3 kg   SpO2 100%   BMI 37.12 kg/m   Physical Exam  Constitutional: She appears well-developed and well-nourished.  HENT:  Head: Normocephalic and atraumatic.  Eyes: Pupils are equal, round, and reactive to light. Conjunctivae are normal.  Neck: Neck supple. No tracheal deviation present. No thyromegaly present.  Cardiovascular: Normal rate and regular rhythm.  No murmur heard. Pulmonary/Chest: Effort normal and breath sounds normal. She exhibits tenderness.  Breast exam performed with chaperone bilateral breast with no masses.  No axillary nodes.  Nipples appear normal.  No discharge left chest pain is exacerbated by forcible abduction of left shoulder  Abdominal: Soft. Bowel sounds are normal. She exhibits no distension. There is no tenderness.  Musculoskeletal: Normal range of motion. She exhibits no edema or tenderness.  Neurological: She is alert. Coordination normal.  Skin: Skin is warm and dry. No rash noted.  Psychiatric: She has a normal mood and affect.  Nursing note and vitals reviewed.    ED Treatments / Results  Labs (all labs ordered are listed, but only abnormal results are displayed) Labs Reviewed  CBC - Abnormal; Notable for the following components:      Result Value   Hemoglobin 10.0 (*)    HCT 33.0 (*)    MCH 25.4 (*)    RDW 19.5 (*)    All other components within normal limits  BASIC METABOLIC PANEL  I-STAT TROPONIN, ED  I-STAT BETA HCG BLOOD, ED ([facility], [facility], [facility] ONLY)    EKG EKG Interpretation  Date/Time:  Sunday October 17 2018 11:41:19 EDT Ventricular Rate:  81 PR Interval:  144 QRS  Duration: 76 QT Interval:  386 QTC Calculation: 448 R Axis:   62 Text Interpretation:  Normal sinus rhythm Normal ECG No significant change since last tracing Confirmed by Orlie Dakin [phone]) on 10/17/2018 1:50:50 PM   Radiology Dg Chest 2 View  Result Date: 10/17/2018 CLINICAL DATA:  Chest pain for 2 weeks. EXAM: CHEST - 2 VIEW COMPARISON:  10/03/2015 FINDINGS: The  heart is upper limits of normal in size and stable. There is tortuosity of the thoracic aorta. Mild eventration of the right hemidiaphragm. No infiltrates, edema or effusions. Stable degenerative changes involving the thoracic spine. IMPRESSION: No acute cardiopulmonary findings. Electronically Signed   By: Marijo Sanes M.D.   On: 10/17/2018 13:25    Procedures Procedures (including critical care time)  Medications Ordered in ED Medications - No data to display  Chest x-ray viewed by me Results for orders placed or performed during the hospital encounter of 91/63/84  Basic metabolic panel  Result Value Ref Range   Sodium 138 135 - 145 mmol/L   Potassium 4.5 3.5 - 5.1 mmol/L   Chloride 106 98 - 111 mmol/L   CO2 23 22 - 32 mmol/L   Glucose, Bld 93 70 - 99 mg/dL   BUN 20 6 - 20 mg/dL   Creatinine, Ser 0.94 0.44 - 1.00 mg/dL   Calcium 9.3 8.9 - 10.3 mg/dL   GFR calc non Af Amer >60 >60 mL/min   GFR calc Af Amer >60 >60 mL/min   Anion gap 9 5 - 15  CBC  Result Value Ref Range   WBC 5.1 4.0 - 10.5 K/uL   RBC 3.94 3.87 - 5.11 MIL/uL   Hemoglobin 10.0 (L) 12.0 - 15.0 g/dL   HCT 33.0 (L) 36.0 - 46.0 %   MCV 83.8 80.0 - 100.0 fL   MCH 25.4 (L) 26.0 - 34.0 pg   MCHC 30.3 30.0 - 36.0 g/dL   RDW 19.5 (H) 11.5 - 15.5 %   Platelets 231 150 - 400 K/uL   nRBC 0.0 0.0 - 0.2 %  I-stat troponin, ED  Result Value Ref Range   Troponin i, poc 0.00 0.00 - 0.08 ng/mL   Comment 3          I-Stat beta hCG blood, ED  Result Value Ref Range   I-stat hCG, quantitative <5.0 <5 mIU/mL   Comment 3           Dg Chest 2  View  Result Date: 10/17/2018 CLINICAL DATA:  Chest pain for 2 weeks. EXAM: CHEST - 2 VIEW COMPARISON:  10/03/2015 FINDINGS: The heart is upper limits of normal in size and stable. There is tortuosity of the thoracic aorta. Mild eventration of the right hemidiaphragm. No infiltrates, edema or effusions. Stable degenerative changes involving the thoracic spine. IMPRESSION: No acute cardiopulmonary findings. Electronically Signed   By: Marijo Sanes M.D.   On: 10/17/2018 13:25   Initial Impression / Assessment and Plan / ED Course  I have reviewed the triage vital signs and the nursing notes.  Pertinent labs & imaging results that were available during my care of the patient were reviewed by me and considered in my medical decision making (see chart for details).     Strongly doubt cardiac etiology of pain.  Pain felt to be musculoskeletal in etiology.  Heart score equals 2.  I suggest the patient that she contact PCP for mammogram.  Tylenol for pain.  I counseled patient for 5 minutes on smoking cessation.  Blood pressure recheck 3 weeks Lab work unremarkable except for hemoglobin of 10 consistent with chronic anemia hemoglobin improved from 2018 Final Clinical Impressions(s) / ED Diagnoses  Dx #1 chest wall pain #2 left breast pain #3 elevated blood pressure #4 tobacco abuse Final diagnoses:  None  #5 anemia  ED Discharge Orders    None       Orlie Dakin, MD  10/17/18 1409  

## 2018-10-17 NOTE — ED Notes (Signed)
Pt was able to ambulate to restroom from room tolerated well.

## 2018-10-17 NOTE — Discharge Instructions (Addendum)
Take Tylenol as directed for pain.  Call your doctor at the outpatient clinic to arrange for the next available appointment.  Ask your doctor to order a mammogram for you to check for breast cancer.  You should also ask your doctor to help you to stop smoking.  Your blood pressure should be rechecked within the next 3 weeks.  Today's is mildly elevated at 141/88

## 2018-10-17 NOTE — ED Notes (Signed)
Patient able to ambulate independently  

## 2018-11-23 ENCOUNTER — Emergency Department (HOSPITAL_COMMUNITY): Payer: Medicare Other

## 2018-11-23 ENCOUNTER — Emergency Department (HOSPITAL_COMMUNITY)
Admission: EM | Admit: 2018-11-23 | Discharge: 2018-11-23 | Disposition: A | Discharge status: Home / Self Care | Payer: Medicare Other | Attending: Emergency Medicine | Admitting: Emergency Medicine

## 2018-11-23 ENCOUNTER — Encounter (HOSPITAL_COMMUNITY): Payer: Self-pay | Admitting: Emergency Medicine

## 2018-11-23 DIAGNOSIS — Z96653 Presence of artificial knee joint, bilateral: Secondary | ICD-10-CM | POA: Diagnosis not present

## 2018-11-23 DIAGNOSIS — F17210 Nicotine dependence, cigarettes, uncomplicated: Secondary | ICD-10-CM | POA: Insufficient documentation

## 2018-11-23 DIAGNOSIS — I10 Essential (primary) hypertension: Secondary | ICD-10-CM | POA: Diagnosis not present

## 2018-11-23 DIAGNOSIS — R05 Cough: Secondary | ICD-10-CM | POA: Diagnosis not present

## 2018-11-23 DIAGNOSIS — Z79899 Other long term (current) drug therapy: Secondary | ICD-10-CM | POA: Diagnosis not present

## 2018-11-23 DIAGNOSIS — J321 Chronic frontal sinusitis: Secondary | ICD-10-CM | POA: Insufficient documentation

## 2018-11-23 DIAGNOSIS — J029 Acute pharyngitis, unspecified: Secondary | ICD-10-CM | POA: Diagnosis present

## 2018-11-23 DIAGNOSIS — Z96652 Presence of left artificial knee joint: Secondary | ICD-10-CM | POA: Diagnosis not present

## 2018-11-23 MED ORDER — METHOCARBAMOL 500 MG PO TABS: 500.0000 mg | ORAL_TABLET | Freq: Three times a day (TID) | ORAL | 0 refills | Status: DC | PRN

## 2018-11-23 MED ORDER — FLUTICASONE PROPIONATE 50 MCG/ACT NA SUSP: 1.0000 | Freq: Every day | NASAL | 0 refills | Status: DC

## 2018-11-23 MED ORDER — BENZONATATE 100 MG PO CAPS: 100.0000 mg | ORAL_CAPSULE | Freq: Three times a day (TID) | ORAL | 0 refills | Status: DC | PRN

## 2018-11-23 MED ORDER — AMOXICILLIN-POT CLAVULANATE 875-125 MG PO TABS: 1.0000 | ORAL_TABLET | Freq: Two times a day (BID) | ORAL | 0 refills | Status: DC

## 2018-11-23 NOTE — ED Provider Notes (Signed)
[facility] EMERGENCY DEPARTMENT Provider Note   CSN: [id number] Arrival date & time: 11/23/18  1236     History   Chief Complaint Chief Complaint  Patient presents with  . Nasal Congestion  . Cough    HPI [name] is a 52 y.o. female with a hx of tobacco abuse, anemia, fibroids, HTN, and thyroid goiter who presents to the ED with complaints of URI sxs x 1 month. Patient reports congestion, sinus pain/pressure, ear pain, sore throat, and productive cough with yellow phlegm. She states she coughs so much she has started to hurt in there upper back area w/ coughing and now just feels sore at rest. Tried OTC mucinex without relief. Denies fever, chills, vomiting, diarrhea, dyspnea, numbness, weakness, paresthesias, IVDU, hx of cancer, or incontinence.   HPI  Past Medical History:  Diagnosis Date  . Anemia   . Fibroids   . Hypertension   . Hypertension    no current meds for htn  . Knee joint injury   . Thyroid goiter     Patient Active Problem List   Diagnosis Date Noted  . Intermittent palpitations 11/17/2017  . Menopause 06/23/2017  . Hot flashes 09/23/2016  . Healthcare maintenance 09/23/2016  . S/P total knee arthroplasty, 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  . Uterine fibroid 05/09/2013  . Tobacco use disorder 04/12/2013  . OA (osteoarthritis) 10/28/2011  . 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 MORBID OBESITY 09/17/2009  . Essential hypertension 09/17/2009  . Pain in limb 09/17/2009  . ANEMIA, HX OF 09/17/2009    Past Surgical History:  Procedure Laterality Date  . ANKLE SURGERY Right 2009   broken ankle   . arthroscopic surgery to knee Bilateral   . CESAREAN SECTION    . HAND SURGERY    . JOINT REPLACEMENT Bilateral    knees  . REPLACEMENT TOTAL KNEE Right 2012  . TOTAL KNEE ARTHROPLASTY Left 01/03/2014   Procedure: LEFT TOTAL KNEE ARTHROPLASTY;  Surgeon: Tobi Bastos, MD;  Location: [facility];  Service: Orthopedics;  Laterality: Left;  . TUBAL  LIGATION       OB History    Gravida  4   Para  2   Term  2   Preterm  0   AB  2   Living  2     SAB  1   TAB  1   Ectopic  0   Multiple      Live Births  2            Home Medications    Prior to Admission medications   Medication Sig Start Date End Date Taking? Authorizing Provider  atenolol (TENORMIN) 50 MG tablet Take 1 tablet (50 mg total) by mouth daily. 12/08/17 02/06/18  Molt, Bethany, DO  baclofen (LIORESAL) 10 MG tablet Take 1 tablet (10 mg total) by mouth 3 (three) times daily. 10/11/17   Margarita Mail, PA-C  celecoxib (CELEBREX) 200 MG capsule Take 1 capsule (200 mg total) by mouth 2 (two) times daily. 10/11/17   Margarita Mail, PA-C  erythromycin with ethanol (EMGEL) 2 % gel Apply to affected area 2 times daily. 10/22/17   Evelina Bucy, DPM  lidocaine (XYLOCAINE) 2 % solution Use as directed 15 mLs in the mouth or throat every 6 (six) hours as needed. 03/17/18   Pisciotta, Elmyra Ricks, PA-C  predniSONE (DELTASONE) 50 MG tablet Take 1 tablet daily with breakfast 03/17/18   Pisciotta, Elmyra Ricks, PA-C  VOLTAREN 1 % GEL APPLY 2GM  TOPICALLY FOUR TIMES DAILY AS DIRECTED 01/25/18   Molt, Bethany, DO    Family History Family History  Problem Relation Age of Onset  . Hypertension Mother   . Hypertension Father   . Cancer Maternal Grandmother        ? lung    Social History Social History   Tobacco Use  . Smoking status: Current Every Day Smoker    Packs/day: 0.20    Years: 0.00    Pack years: 0.00    Types: Cigarettes  . Smokeless tobacco: Never Used  . Tobacco comment: 1-2  cigs per day  Substance Use Topics  . Alcohol use: Yes    Alcohol/week: 2.0 standard drinks    Types: 2 Standard drinks or equivalent per week    Comment: occasionally once per month  . Drug use: No     Allergies   Tramadol; Ibuprofen; and Metronidazole   Review of Systems Review of Systems  Constitutional: Negative for chills, fever and unexpected weight change.    HENT: Positive for congestion, ear pain, sinus pressure, sinus pain and sore throat. Negative for trouble swallowing and voice change.   Respiratory: Positive for cough. Negative for shortness of breath.   Cardiovascular: Negative for chest pain.  Gastrointestinal: Negative for abdominal pain, diarrhea and vomiting.  Musculoskeletal: Positive for back pain.  Neurological: Negative for weakness and numbness.     Physical Exam Updated Vital Signs Ht 5\' 6"  (1.676 m)   Wt 106.6 kg   BMI 37.93 kg/m   Physical Exam  Constitutional: She appears well-developed and well-nourished. No distress.  HENT:  Head: Normocephalic and atraumatic.  Right Ear: Tympanic membrane is not perforated, not erythematous, not retracted and not bulging.  Left Ear: Tympanic membrane is not perforated, not erythematous, not retracted and not bulging.  Nose: Mucosal edema present. Right sinus exhibits frontal sinus tenderness. Right sinus exhibits no maxillary sinus tenderness. Left sinus exhibits frontal sinus tenderness. Left sinus exhibits no maxillary sinus tenderness.  Mouth/Throat: Uvula is midline and oropharynx is clear and moist. No oropharyngeal exudate or posterior oropharyngeal erythema.  Posterior oropharynx is symmetric appearing. Patient tolerating own secretions without difficulty. No trismus. No drooling. No hot potato voice. No swelling beneath the tongue, submandibular compartment is soft.   Eyes: Pupils are equal, round, and reactive to light. Conjunctivae are normal. Right eye exhibits no discharge. Left eye exhibits no discharge.  Neck: Normal range of motion. Neck supple. No neck rigidity. No edema and no erythema present.  Cardiovascular: Normal rate and regular rhythm.  No murmur heard. Pulmonary/Chest: Effort normal and breath sounds normal. No respiratory distress. She has no wheezes. She has no rhonchi. She has no rales.  Abdominal: Soft. She exhibits no distension. There is no tenderness.  There is no rigidity, no rebound and no guarding.  Musculoskeletal:  No obvious deformity, appreciable swelling, erythema, ecchymosis, or open wounds Back: Patient has diffuse thoracic bilateral paraspinal muscle tenderness/posterior chest wall tenderness to palpation.  No palpable crepitus or deformity.  No overlying skin changes.  Lymphadenopathy:    She has no cervical adenopathy.  Neurological: She is alert.  Symmetric 5/5 strength with grip strength and with ankle plantar/dorsiflexion. Sensation grossly intact x 4.   Skin: Skin is warm and dry. No rash noted.  Psychiatric: She has a normal mood and affect. Her behavior is normal.  Nursing note and vitals reviewed.    ED Treatments / Results  Labs (all labs ordered are listed, but only abnormal results  are displayed) Labs Reviewed - No data to display  EKG None  Radiology Dg Chest 2 View  Result Date: 11/23/2018 CLINICAL DATA:  Cough. EXAM: CHEST - 2 VIEW COMPARISON:  10/17/2018 FINDINGS: Normal heart size and mediastinal contours. No acute infiltrate or edema. No effusion or pneumothorax. Spondylosis with bulky spurring. No acute osseous findings. IMPRESSION: No evidence of active disease. Electronically Signed   By: Monte Fantasia M.D.   On: 11/23/2018 13:48    Procedures Procedures (including critical care time)  Medications Ordered in ED Medications - No data to display   Initial Impression / Assessment and Plan / ED Course  I have reviewed the triage vital signs and the nursing notes.  Pertinent labs & imaging results that were available during my care of the patient were reviewed by me and considered in my medical decision making (see chart for details).   Patient presents to the ED with URI sxs x 1 month. Patient nontoxic appearing, resting comfortably, vitals w/ elevated BP, otherwise WNL, doubt HTN emergency- PCP recheck. Exam with nasal congestion & sinus tenderness. She also has reproducible posterior chest  wall pain with palpation- no point/focal midline tenderness, suspect muscular in nature secondary to coughing, no neuro deficits noted, CXR obtained negative for fracture/dislocation, also no pneumonia, pneumothorax, edema, or effusion noted. Centor 0 doubt strep. No AOM/AOE/mastoiditis on exam. No meningismus. Favor viral vs. Allergic, however patient has had sxs for > 21 days with trial of OTC meds, therefore feel that treatment for possible bacterial sinusitis is reaosnable given exam & duration. Will start on Augmentin, additional supportive measures including flonase, tessalon, and a muscle relaxant also prescribed (discussed no driving/operating heavy machinery with this). PCP recheck. I discussed results, treatment plan, need for follow-up, and return precautions with the patient. Provided opportunity for questions, patient confirmed understanding and is in agreement with plan.   Vitals:   11/23/18 1249 11/23/18 1318  BP: (!) 183/105 (!) 149/102  Pulse: 66 65  Resp: 15 16  Temp: 97.8 F (36.6 C)   SpO2: 100% 100%    Final Clinical Impressions(s) / ED Diagnoses   Final diagnoses:  Frontal sinusitis, unspecified chronicity    ED Discharge Orders         Ordered    amoxicillin-clavulanate (AUGMENTIN) 875-125 MG tablet  Every 12 hours     11/23/18 1412    fluticasone (FLONASE) 50 MCG/ACT nasal spray  Daily     11/23/18 1412    benzonatate (TESSALON) 100 MG capsule  3 times daily PRN     11/23/18 1412    methocarbamol (ROBAXIN) 500 MG tablet  Every 8 hours PRN     11/23/18 1412           Levern Kalka, [place] R, PA-C 11/23/18 1415    Maudie Flakes, MD 11/23/18 1507

## 2018-11-23 NOTE — ED Notes (Signed)
Patient verbalizes understanding of discharge instructions. Opportunity for questioning and answers were provided. 

## 2018-11-23 NOTE — ED Notes (Signed)
Patient transported to X-ray 

## 2018-11-23 NOTE — ED Triage Notes (Signed)
Pt states she has been having nasal congestion, headaches, sore throat, cough with yellow phlegm for a month.

## 2018-11-23 NOTE — Discharge Instructions (Signed)
You were seen in the emergency today for upper respiratory symptoms. Your chest xray was normal. We are treating you with an antibiotic, augmentin, for possible bacterial sinus infection given the duration of your symptoms, it is possible that this is still due to allergies or a virus.    Please take all of your antibiotics until finished. You may develop abdominal discomfort or diarrhea from the antibiotic.  You may help offset this with probiotics which you can buy at the store (ask your pharmacist if unable to find) or get probiotics in the form of eating yogurt. Do not eat or take the probiotics until 2 hours after your antibiotic. If you are unable to tolerate these side effects follow-up with your primary care provider or return to the emergency department.   We have prescribed you multiple additional medications to treat your symptoms as well.   -Flonase to be used 1 spray in each nostril daily.  This medication is used to treat your congestion.  -Tessalon can be taken once every 8 hours as needed.  This medication is used to treat your cough.  - Robaxin is the muscle relaxer I have prescribed, this is meant to help with muscle tightness. Be aware that this medication may make you drowsy therefore the first time you take this it should be at a time you are in an environment where you can rest. Do not drive or operate heavy machinery when taking this medication. Do not drink alcohol or take other sedating medications with this medicine such as narcotics or benzodiazepines.   You make take Tylenol per over the counter dosing with these medications.   We have prescribed you new medication(s) today. Discuss the medications prescribed today with your pharmacist as they can have adverse effects and interactions with your other medicines including over the counter and prescribed medications. Seek medical evaluation if you start to experience new or abnormal symptoms after taking one of these medicines,  seek care immediately if you start to experience difficulty breathing, feeling of your throat closing, facial swelling, or rash as these could be indications of a more serious allergic reaction  You will need to follow-up with your primary care provider in 1 week if your symptoms have not improved and to have your blood pressure rechecked as it was elevated in the ER today.  If you do not have a primary care provider one is provided in your discharge instructions.  Return to the emergency department for any new or worsening symptoms including but not limited to persistent fever for 5 days, difficulty breathing, chest pain, rashes, passing out, or any other concerns.    Vitals:   11/23/18 1249 11/23/18 1318  BP: (!) 183/105 (!) 149/102  Pulse: 66 65  Resp: 15 16  Temp: 97.8 F (36.6 C)   SpO2: 100% 100%

## 2018-12-02 ENCOUNTER — Ambulatory Visit: Payer: Medicare Other

## 2019-01-04 ENCOUNTER — Encounter: Payer: Self-pay | Admitting: Internal Medicine

## 2019-01-17 ENCOUNTER — Emergency Department (HOSPITAL_COMMUNITY): Payer: Medicare Other

## 2019-01-17 ENCOUNTER — Encounter (HOSPITAL_COMMUNITY): Payer: Self-pay

## 2019-01-17 ENCOUNTER — Emergency Department (HOSPITAL_COMMUNITY)
Admission: EM | Admit: 2019-01-17 | Discharge: 2019-01-17 | Disposition: A | Discharge status: Home / Self Care | Payer: Medicare Other | Attending: Emergency Medicine | Admitting: Emergency Medicine

## 2019-01-17 ENCOUNTER — Other Ambulatory Visit: Payer: Self-pay

## 2019-01-17 DIAGNOSIS — M545 Low back pain, unspecified: Secondary | ICD-10-CM

## 2019-01-17 DIAGNOSIS — M47816 Spondylosis without myelopathy or radiculopathy, lumbar region: Secondary | ICD-10-CM | POA: Diagnosis not present

## 2019-01-17 DIAGNOSIS — Z96653 Presence of artificial knee joint, bilateral: Secondary | ICD-10-CM | POA: Insufficient documentation

## 2019-01-17 DIAGNOSIS — I10 Essential (primary) hypertension: Secondary | ICD-10-CM | POA: Insufficient documentation

## 2019-01-17 DIAGNOSIS — F17210 Nicotine dependence, cigarettes, uncomplicated: Secondary | ICD-10-CM | POA: Insufficient documentation

## 2019-01-17 DIAGNOSIS — Z79899 Other long term (current) drug therapy: Secondary | ICD-10-CM | POA: Diagnosis not present

## 2019-01-17 LAB — URINALYSIS, ROUTINE W REFLEX MICROSCOPIC
Bilirubin Urine: NEGATIVE
GLUCOSE, UA: NEGATIVE mg/dL
Hgb urine dipstick: NEGATIVE
KETONES UR: NEGATIVE mg/dL
Leukocytes, UA: NEGATIVE
Nitrite: NEGATIVE
PH: 5 (ref 5.0–8.0)
Protein, ur: NEGATIVE mg/dL
SPECIFIC GRAVITY, URINE: 1.029 (ref 1.005–1.030)

## 2019-01-17 MED ORDER — METHOCARBAMOL 500 MG PO TABS: 500.0000 mg | ORAL_TABLET | Freq: Three times a day (TID) | ORAL | 0 refills | Status: DC | PRN

## 2019-01-17 NOTE — ED Notes (Signed)
Patient requested a work note with a return date of 01/17/19.

## 2019-01-17 NOTE — ED Provider Notes (Signed)
[facility] DEPT Provider Note   CSN: [id number] Arrival date & time: 01/17/19  1843     History   Chief Complaint Chief Complaint  Patient presents with  . Back Pain    HPI [name] is a 53 y.o. female.  53 year old female presents with ongoing right-sided back pain.  Patient was seen in the emergency room on December 3 for URI symptoms with back pain reproduced with palpation.  Patient was told at the time that she had pulled a muscle in her back.  Patient continues to have pain and feels that this is not a pulled muscle.  Patient states pain is worse with palpation of her back as well as with movement specifically bending to the right, pain improved somewhat if she leans towards the left side.  Patient has not been taking anything for her pain and has not followed up with her primary care provider.  Patient denies any falls or injuries, denies chest pain or feeling short of breath, denies abdominal pain, pain is not worse with eating, denies nausea, vomiting, changes in bowel habits.  Patient states she has stinging with urination, denies hematuria or frequency.  No other complaints or concerns.     Past Medical History:  Diagnosis Date  . Anemia   . Fibroids   . Hypertension   . Hypertension    no current meds for htn  . Knee joint injury   . Thyroid goiter     Patient Active Problem List   Diagnosis Date Noted  . Intermittent palpitations 11/17/2017  . Menopause 06/23/2017  . Hot flashes 09/23/2016  . Healthcare maintenance 09/23/2016  . S/P total knee arthroplasty, 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  . Uterine fibroid 05/09/2013  . Tobacco use disorder 04/12/2013  . OA (osteoarthritis) 10/28/2011  . 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 MORBID OBESITY 09/17/2009  . Essential hypertension 09/17/2009  . Pain in limb 09/17/2009  . ANEMIA, HX OF 09/17/2009    Past Surgical History:  Procedure Laterality Date  . ANKLE SURGERY Right 2009   broken ankle   . arthroscopic surgery to knee Bilateral   . CESAREAN SECTION    . HAND SURGERY    . JOINT REPLACEMENT Bilateral    knees  . REPLACEMENT TOTAL KNEE Right 2012  . TOTAL KNEE ARTHROPLASTY Left 01/03/2014   Procedure: LEFT TOTAL KNEE ARTHROPLASTY;  Surgeon: Tobi Bastos, MD;  Location: [facility];  Service: Orthopedics;  Laterality: Left;  . TUBAL LIGATION       OB History    Gravida  4   Para  2   Term  2   Preterm  0   AB  2   Living  2     SAB  1   TAB  1   Ectopic  0   Multiple      Live Births  2            Home Medications    Prior to Admission medications   Medication Sig Start Date End Date Taking? Authorizing Provider  atenolol (TENORMIN) 50 MG tablet Take 1 tablet (50 mg total) by mouth daily. 12/08/17 02/06/18  Molt, Bethany, DO  baclofen (LIORESAL) 10 MG tablet Take 1 tablet (10 mg total) by mouth 3 (three) times daily. 10/11/17   Margarita Mail, PA-C  celecoxib (CELEBREX) 200 MG capsule Take 1 capsule (200 mg total) by mouth 2 (two) times daily. 10/11/17   Margarita Mail, PA-C  erythromycin with ethanol (EMGEL)  2 % gel Apply to affected area 2 times daily. 10/22/17   Evelina Bucy, DPM  fluticasone (FLONASE) 50 MCG/ACT nasal spray Place 1 spray into both nostrils daily. 11/23/18   Petrucelli, Samantha R, PA-C  lidocaine (XYLOCAINE) 2 % solution Use as directed 15 mLs in the mouth or throat every 6 (six) hours as needed. 03/17/18   Pisciotta, Elmyra Ricks, PA-C  methocarbamol (ROBAXIN) 500 MG tablet Take 1 tablet (500 mg total) by mouth every 8 (eight) hours as needed. 01/17/19   Tacy Learn, PA-C  predniSONE (DELTASONE) 50 MG tablet Take 1 tablet daily with breakfast 03/17/18   Pisciotta, Elmyra Ricks, PA-C  VOLTAREN 1 % GEL APPLY 2GM TOPICALLY FOUR TIMES DAILY AS DIRECTED 01/25/18   Molt, Bethany, DO    Family History Family History  Problem Relation Age of Onset  . Hypertension Mother   . Hypertension Father   . Cancer Maternal Grandmother         ? lung    Social History Social History   Tobacco Use  . Smoking status: Current Every Day Smoker    Packs/day: 0.20    Years: 0.00    Pack years: 0.00    Types: Cigarettes  . Smokeless tobacco: Never Used  . Tobacco comment: 1-2  cigs per day  Substance Use Topics  . Alcohol use: Yes    Alcohol/week: 2.0 standard drinks    Types: 2 Standard drinks or equivalent per week    Comment: occasionally once per month  . Drug use: No     Allergies   Tramadol; Ibuprofen; and Metronidazole   Review of Systems Review of Systems  Constitutional: Negative for chills and fever.  Respiratory: Negative for shortness of breath.   Cardiovascular: Negative for chest pain.  Gastrointestinal: Negative for abdominal pain, constipation, diarrhea, nausea and vomiting.  Genitourinary: Positive for dysuria. Negative for frequency, hematuria and urgency.  Musculoskeletal: Positive for back pain and myalgias. Negative for arthralgias, gait problem and joint swelling.  Skin: Negative for rash and wound.  Neurological: Negative for dizziness, weakness and numbness.  Hematological: Does not bruise/bleed easily.  Psychiatric/Behavioral: Negative for confusion.  All other systems reviewed and are negative.    Physical Exam Updated Vital Signs BP (!) 155/83 (BP Location: Left Arm)   Pulse 80   Temp 98.4 F (36.9 C) (Oral)   Resp 16   Ht 5\' 6"  (1.676 m)   Wt 108 kg   SpO2 100%   BMI 38.41 kg/m   Physical Exam Vitals signs and nursing note reviewed.  Constitutional:      General: She is not in acute distress.    Appearance: She is well-developed. She is not diaphoretic.  HENT:     Head: Normocephalic and atraumatic.  Cardiovascular:     Rate and Rhythm: Normal rate and regular rhythm.     Pulses: Normal pulses.     Heart sounds: Normal heart sounds.  Pulmonary:     Effort: Pulmonary effort is normal.     Breath sounds: Normal breath sounds.  Abdominal:     Tenderness: There  is no abdominal tenderness.  Musculoskeletal:        General: Tenderness present.     Thoracic back: She exhibits no bony tenderness.     Lumbar back: She exhibits tenderness. She exhibits no bony tenderness.       Back:  Skin:    General: Skin is warm and dry.     Findings: No erythema or rash.  Neurological:     Mental Status: She is alert and oriented to person, place, and time.  Psychiatric:        Behavior: Behavior normal.      ED Treatments / Results  Labs (all labs ordered are listed, but only abnormal results are displayed) Labs Reviewed  URINALYSIS, ROUTINE W REFLEX MICROSCOPIC    EKG None  Radiology Dg Lumbar Spine Complete  Result Date: 01/17/2019 CLINICAL DATA:  53 year old female with right-sided pain for 1 week EXAM: LUMBAR SPINE - COMPLETE 4+ VIEW COMPARISON:  CT abdomen 01/21/2016 FINDINGS: Lumbar Spine: Lumbar vertebral elements maintain normal alignment without evidence of subluxation. Trace anterolisthesis of L4 on L5. Oblique images demonstrate no displaced pars defect. No acute fracture line identified. Vertebral body heights maintained. Mild disc space narrowing throughout the lumbar spine. Endplate changes throughout, most pronounced at L4-L5. Facet hypertrophy most pronounced at L4-L5 and L5-S1 Unremarkable appearance of the visualized abdomen. IMPRESSION: Negative for acute fracture or malalignment of the lumbar spine. Electronically Signed   By: Corrie Mckusick D.O.   On: 01/17/2019 21:55    Procedures Procedures (including critical care time)  Medications Ordered in ED Medications - No data to display   Initial Impression / Assessment and Plan / ED Course  I have reviewed the triage vital signs and the nursing notes.  Pertinent labs & imaging results that were available during my care of the patient were reviewed by me and considered in my medical decision making (see chart for details).  Clinical Course as of Jan 17 2158  Mon Jan 27, 20252    7513 53 year old female presents with back pain since December 3 when told she had a pulled muscle secondary to an upper respiratory infection.  Chest x-ray at that time was normal.  Pain is worse with movement and palpation, reproduced with palpation of her right lower back on right posterior lower ribs.  Patient also reports stinging with urination, no other urinary symptoms.  Urinalysis is normal.  Patient's abdomen is soft and nontender, exam appears consistent with musculoskeletal pain.  X-ray with arthritic changes otherwise no acute findings.  Patient given prescription for Robaxin and advised to recheck with PCP.   [LM]    Clinical Course User Index [LM] Tacy Learn, PA-C   Final Clinical Impressions(s) / ED Diagnoses   Final diagnoses:  Acute right-sided low back pain without sciatica  Spondylosis of lumbar region without myelopathy or radiculopathy    ED Discharge Orders         Ordered    methocarbamol (ROBAXIN) 500 MG tablet  Every 8 hours PRN     01/17/19 2125           Tacy Learn, PA-C 01/17/19 2159    Sherwood Gambler, MD 01/17/19 [phone]

## 2019-01-17 NOTE — Discharge Instructions (Addendum)
Follow up with your doctor. Apply warm compresses to your back for 30 minutes at a time. Take Robaxin as prescribed as needed for pain. You can also apply OTC lidocaine patches of capsicin.

## 2019-01-17 NOTE — ED Triage Notes (Signed)
Pt states right sided back pain x 1 week. Pt states she was seen at [facility], and told it was a pulled muscle, but pt is concerned there is something "wrong". Pt states she has some "stinging" with urination.

## 2019-01-22 ENCOUNTER — Other Ambulatory Visit: Payer: Self-pay

## 2019-01-22 ENCOUNTER — Encounter (HOSPITAL_COMMUNITY): Payer: Self-pay

## 2019-01-22 ENCOUNTER — Emergency Department (HOSPITAL_COMMUNITY)
Admission: EM | Admit: 2019-01-22 | Discharge: 2019-01-22 | Disposition: A | Discharge status: Home / Self Care | Payer: Medicare Other | Attending: Emergency Medicine | Admitting: Emergency Medicine

## 2019-01-22 ENCOUNTER — Emergency Department (HOSPITAL_COMMUNITY): Payer: Medicare Other

## 2019-01-22 DIAGNOSIS — F17210 Nicotine dependence, cigarettes, uncomplicated: Secondary | ICD-10-CM | POA: Diagnosis not present

## 2019-01-22 DIAGNOSIS — R1011 Right upper quadrant pain: Secondary | ICD-10-CM

## 2019-01-22 DIAGNOSIS — K529 Noninfective gastroenteritis and colitis, unspecified: Secondary | ICD-10-CM | POA: Insufficient documentation

## 2019-01-22 DIAGNOSIS — Z79899 Other long term (current) drug therapy: Secondary | ICD-10-CM | POA: Diagnosis not present

## 2019-01-22 DIAGNOSIS — I10 Essential (primary) hypertension: Secondary | ICD-10-CM | POA: Insufficient documentation

## 2019-01-22 DIAGNOSIS — R109 Unspecified abdominal pain: Secondary | ICD-10-CM | POA: Diagnosis not present

## 2019-01-22 LAB — COMPREHENSIVE METABOLIC PANEL
ALK PHOS: 72 U/L (ref 38–126)
ALT: 14 U/L (ref 0–44)
AST: 19 U/L (ref 15–41)
Albumin: 3.4 g/dL — ABNORMAL LOW (ref 3.5–5.0)
Anion gap: 10 (ref 5–15)
BUN: 12 mg/dL (ref 6–20)
CALCIUM: 8.9 mg/dL (ref 8.9–10.3)
CO2: 23 mmol/L (ref 22–32)
CREATININE: 1.17 mg/dL — AB (ref 0.44–1.00)
Chloride: 104 mmol/L (ref 98–111)
GFR calc non Af Amer: 53 mL/min — ABNORMAL LOW (ref >60)
GLUCOSE: 98 mg/dL (ref 70–99)
Potassium: 4.2 mmol/L (ref 3.5–5.1)
SODIUM: 137 mmol/L (ref 135–145)
Total Bilirubin: 0.5 mg/dL (ref 0.3–1.2)
Total Protein: 6.6 g/dL (ref 6.5–8.1)

## 2019-01-22 LAB — CBC WITH DIFFERENTIAL/PLATELET
Abs Immature Granulocytes: 0.01 10*3/uL (ref 0.00–0.07)
Basophils Absolute: 0 10*3/uL (ref 0.0–0.1)
Basophils Relative: 0 %
EOS PCT: 3 %
Eosinophils Absolute: 0.2 10*3/uL (ref 0.0–0.5)
HEMATOCRIT: 31.5 % — AB (ref 36.0–46.0)
HEMOGLOBIN: 9.5 g/dL — AB (ref 12.0–15.0)
Immature Granulocytes: 0 %
Lymphocytes Relative: 25 %
Lymphs Abs: 1.6 10*3/uL (ref 0.7–4.0)
MCH: 24.9 pg — AB (ref 26.0–34.0)
MCHC: 30.2 g/dL (ref 30.0–36.0)
MCV: 82.7 fL (ref 80.0–100.0)
MONO ABS: 0.5 10*3/uL (ref 0.1–1.0)
MONOS PCT: 8 %
Neutro Abs: 4 10*3/uL (ref 1.7–7.7)
Neutrophils Relative %: 64 %
Platelets: 223 10*3/uL (ref 150–400)
RBC: 3.81 MIL/uL — AB (ref 3.87–5.11)
RDW: 18.6 % — ABNORMAL HIGH (ref 11.5–15.5)
WBC: 6.4 10*3/uL (ref 4.0–10.5)
nRBC: 0 % (ref 0.0–0.2)

## 2019-01-22 LAB — LIPASE, BLOOD: Lipase: 44 U/L (ref 11–51)

## 2019-01-22 MED ORDER — FENTANYL CITRATE (PF) 100 MCG/2ML IJ SOLN
50.0000 ug | Freq: Once | INTRAMUSCULAR | Status: AC
  Administered 2019-01-22: 50 ug via INTRAVENOUS
  Filled 2019-01-22: qty 2

## 2019-01-22 MED ORDER — IOHEXOL 300 MG/ML  SOLN
100.0000 mL | Freq: Once | INTRAMUSCULAR | Status: AC | PRN
  Administered 2019-01-22: 100 mL via INTRAVENOUS

## 2019-01-22 MED ORDER — SODIUM CHLORIDE 0.9 % IV BOLUS
1000.0000 mL | Freq: Once | INTRAVENOUS | Status: AC
  Administered 2019-01-22: 1000 mL via INTRAVENOUS

## 2019-01-22 MED ORDER — ONDANSETRON HCL 4 MG PO TABS: 4.0000 mg | ORAL_TABLET | Freq: Three times a day (TID) | ORAL | 0 refills | Status: DC | PRN

## 2019-01-22 MED ORDER — ONDANSETRON HCL 4 MG/2ML IJ SOLN
4.0000 mg | Freq: Once | INTRAMUSCULAR | Status: AC
  Administered 2019-01-22: 4 mg via INTRAVENOUS
  Filled 2019-01-22: qty 2

## 2019-01-22 MED ORDER — OXYCODONE-ACETAMINOPHEN 5-325 MG PO TABS: 1.0000 | ORAL_TABLET | Freq: Three times a day (TID) | ORAL | 0 refills | Status: DC | PRN

## 2019-01-22 NOTE — ED Provider Notes (Signed)
Emergency Department Provider Note   I have reviewed the triage vital signs and the nursing notes.   HISTORY  Chief Complaint Abdominal Pain and Back Pain   HPI [name] is a 53 y.o. female who presents the emergency department for ongoing back pain.  Patient states she has had this for over a week and initially just started out with right-sided paraspinal pain and was seen in the emergency room diagnosed with a muscular problem and so also medications and sent home.  She states his medications did not seem to help.  Started to radiate around to the right groin.  Is now started have some burning when she urinates as well but no other urinary changes.  Patient states she not had any symptoms like this in the past.  Has had some nausea but no vomiting.  No diarrhea or constipation recently.  No other associated symptoms No other associated or modifying symptoms.    Past Medical History:  Diagnosis Date  . Anemia   . Fibroids   . Hypertension   . Hypertension    no current meds for htn  . Knee joint injury   . Thyroid goiter     Patient Active Problem List   Diagnosis Date Noted  . Intermittent palpitations 11/17/2017  . Menopause 06/23/2017  . Hot flashes 09/23/2016  . Healthcare maintenance 09/23/2016  . S/P total knee arthroplasty, 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  . Uterine fibroid 05/09/2013  . Tobacco use disorder 04/12/2013  . OA (osteoarthritis) 10/28/2011  . 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 MORBID OBESITY 09/17/2009  . Essential hypertension 09/17/2009  . Pain in limb 09/17/2009  . ANEMIA, HX OF 09/17/2009    Past Surgical History:  Procedure Laterality Date  . ANKLE SURGERY Right 2009   broken ankle   . arthroscopic surgery to knee Bilateral   . CESAREAN SECTION    . HAND SURGERY    . JOINT REPLACEMENT Bilateral    knees  . REPLACEMENT TOTAL KNEE Right 2012  . TOTAL KNEE ARTHROPLASTY Left 01/03/2014   Procedure: LEFT TOTAL KNEE ARTHROPLASTY;  Surgeon:  Tobi Bastos, MD;  Location: [facility];  Service: Orthopedics;  Laterality: Left;  . TUBAL LIGATION      Current Outpatient Rx  . Order #: [id number] Class: Normal  . Order #: [id number] Class: Print  . Order #: [id number] Class: Print  . Order #: [id number] Class: Normal  . Order #: [id number] Class: Normal  . Order #: [id number] Class: Print  . Order #: [id number] Class: Normal  . Order #: [id number] Class: Print  . Order #: [id number] Class: Print  . Order #: [id number] Class: Print  . Order #: [id number] Class: Normal    Allergies Tramadol; Ibuprofen; and Metronidazole  Family History  Problem Relation Age of Onset  . Hypertension Mother   . Hypertension Father   . Cancer Maternal Grandmother        ? lung    Social History Social History   Tobacco Use  . Smoking status: Current Every Day Smoker    Packs/day: 0.20    Years: 0.00    Pack years: 0.00    Types: Cigarettes  . Smokeless tobacco: Never Used  . Tobacco comment: 1-2  cigs per day  Substance Use Topics  . Alcohol use: Yes    Alcohol/week: 2.0 standard drinks    Types: 2 Standard drinks or equivalent per week    Comment: occasionally once per month  . Drug use: No    Review of Systems  All other systems  negative except as documented in the HPI. All pertinent positives and negatives as reviewed in the HPI. ____________________________________________   PHYSICAL EXAM:  VITAL SIGNS: ED Triage Vitals  Enc Vitals Group     BP 01/22/19 1854 (!) 149/110     Pulse Rate 01/22/19 1854 69     Resp 01/22/19 1854 13     Temp 01/22/19 1854 98.8 F (37.1 C)     Temp Source 01/22/19 1854 Oral     SpO2 01/22/19 1854 100 %     Weight --      Height --      Head Circumference --      Peak Flow --      Pain Score 01/22/19 1858 10     Pain Loc --      Pain Edu? --      Excl. in [place]? --     Constitutional: Alert and oriented. Well appearing and in no acute distress. Eyes: Conjunctivae are normal. PERRL. EOMI. Head:  Atraumatic. Nose: No congestion/rhinnorhea. Mouth/Throat: Mucous membranes are moist.  Oropharynx non-erythematous. Neck: No stridor.  No meningeal signs.   Cardiovascular: Normal rate, regular rhythm. Good peripheral circulation. Grossly normal heart sounds.   Respiratory: Normal respiratory effort.  No retractions. Lungs CTAB. Gastrointestinal: Soft and with mild right upper quadrant tenderness. No distention.  Musculoskeletal: No lower extremity tenderness nor edema. No gross deformities of extremities.  Right CVA tenderness.   Neurologic:  Normal speech and language. No gross focal neurologic deficits are appreciated.  Skin:  Skin is warm, dry and intact. No rash noted.   ____________________________________________   LABS (all labs ordered are listed, but only abnormal results are displayed)  Labs Reviewed  CBC WITH DIFFERENTIAL/PLATELET - Abnormal; Notable for the following components:      Result Value   RBC 3.81 (*)    Hemoglobin 9.5 (*)    HCT 31.5 (*)    MCH 24.9 (*)    RDW 18.6 (*)    All other components within normal limits  COMPREHENSIVE METABOLIC PANEL - Abnormal; Notable for the following components:   Creatinine, Ser 1.17 (*)    Albumin 3.4 (*)    GFR calc non Af Amer 53 (*)    All other components within normal limits  LIPASE, BLOOD   ____________________________________________  RADIOLOGY  Ct Abdomen Pelvis W Contrast  Result Date: 01/22/2019 CLINICAL DATA:  53 year old female with acute RIGHT abdominal, flank and pelvic pain for 1 month. EXAM: CT ABDOMEN AND PELVIS WITH CONTRAST TECHNIQUE: Multidetector CT imaging of the abdomen and pelvis was performed using the standard protocol following bolus administration of intravenous contrast. CONTRAST:  120mL OMNIPAQUE IOHEXOL 300 MG/ML  SOLN COMPARISON:  01/21/2016 and prior CTs FINDINGS: Lower chest: Mild cardiomegaly identified.  No acute abnormalities. Hepatobiliary: The liver and gallbladder are unremarkable.  No biliary dilatation. Pancreas: Unremarkable Spleen: Unremarkable Adrenals/Urinary Tract: The kidneys, adrenal glands and bladder are unremarkable except for stable LEFT adrenal adenoma. Stomach/Bowel: Mild stranding adjacent to an anterior UPPER pelvic small bowel loop with equivocal wall thickening is noted. This is nonspecific but may represent an enteritis. No other bowel abnormalities are identified. No bowel obstruction or other bowel wall thickening. The appendix is normal. Vascular/Lymphatic: Aortic atherosclerosis. No enlarged abdominal or pelvic lymph nodes. Reproductive: Uterus and bilateral adnexa are unremarkable. Other: No free fluid, pneumoperitoneum or focal collection. Musculoskeletal: No acute or suspicious bony abnormalities are identified. IMPRESSION: 1. Mild stranding adjacent to ana anterior UPPER pelvic small bowel loops with  equivocal mild wall thickening. This is of uncertain clinical significance but could represent mild inflammation/enteritis. No bowel obstruction or pneumoperitoneum. 2. Mild cardiomegaly 3.  Aortic Atherosclerosis (ICD10-I70.0). Electronically Signed   By: Margarette Canada M.D.   On: 01/22/2019 21:46    ____________________________________________   PROCEDURES  Procedure(s) performed:   Procedures   ____________________________________________   INITIAL IMPRESSION / ASSESSMENT AND PLAN / ED COURSE  Consider kidney stone versus cholecystitis versus cystitis/pyelonephritis.  Will start with labs and urine symptomatic control. imaging based on labs.  Ct with enteritis in area of pain. Will tx w/ nsaids w/ breakthrough pain meds. Stable for dc w/ pcp follow up in 2-3 days.   Pertinent labs & imaging results that were available during my care of the patient were reviewed by me and considered in my medical decision making (see chart for details).  ____________________________________________  FINAL CLINICAL IMPRESSION(S) / ED DIAGNOSES  Final diagnoses:   Right upper quadrant abdominal pain  Enteritis     MEDICATIONS GIVEN DURING THIS VISIT:  Medications  fentaNYL (SUBLIMAZE) injection 50 mcg (50 mcg Intravenous Given 01/22/19 2010)  ondansetron (ZOFRAN) injection 4 mg (4 mg Intravenous Given 01/22/19 2009)  sodium chloride 0.9 % bolus 1,000 mL (0 mLs Intravenous Stopped 01/22/19 2311)  iohexol (OMNIPAQUE) 300 MG/ML solution 100 mL (100 mLs Intravenous Contrast Given 01/22/19 2112)     NEW OUTPATIENT MEDICATIONS STARTED DURING THIS VISIT:  Discharge Medication List as of 01/22/2019 10:47 PM    START taking these medications   Details  ondansetron (ZOFRAN) 4 MG tablet Take 1 tablet (4 mg total) by mouth every 8 (eight) hours as needed for nausea or vomiting., Starting Sat 01/22/2019, Print    oxyCODONE-acetaminophen (PERCOCET) 5-325 MG tablet Take 1 tablet by mouth every 8 (eight) hours as needed for severe pain., Starting Sat 01/22/2019, Print        Note:  This note was prepared with assistance of Dragon voice recognition software. Occasional wrong-word or sound-a-like substitutions may have occurred due to the inherent limitations of voice recognition software.   Merrily Pew, MD 01/23/19 2029

## 2019-01-22 NOTE — ED Notes (Signed)
ED Provider at bedside. 

## 2019-01-22 NOTE — ED Triage Notes (Signed)
Pt arrives POV for continued right sided lower back pain that now radiates to her abd since last week. Pt taking muscle relaxers at home without relief. Denies n.v.d. Pt a.o, nad noted

## 2019-01-23 NOTE — ED Notes (Addendum)
Fentanyl 50 mcg. Wasted with taylor morris rn

## 2019-02-21 ENCOUNTER — Ambulatory Visit (INDEPENDENT_AMBULATORY_CARE_PROVIDER_SITE_OTHER): Payer: Medicare Other | Admitting: Obstetrics and Gynecology

## 2019-02-21 ENCOUNTER — Encounter: Payer: Self-pay | Admitting: Obstetrics and Gynecology

## 2019-02-21 VITALS — BP 174/119 | HR 74 | Wt 239.0 lb

## 2019-02-21 DIAGNOSIS — N643 Galactorrhea not associated with childbirth: Secondary | ICD-10-CM | POA: Diagnosis not present

## 2019-02-21 NOTE — Progress Notes (Addendum)
  Subjective:     Patient ID: [name], female   DOB: June 22, 1966, 53 y.o.   MRN: [id number]  [name] is a 53 y.o. [id number] menopausal female presenting for mastalgia and galactorrhea. She has had leaking of fluid in her L breast for 64mo, but it has been painful for the past 2wks. She leaks clear to yellow fluid, mostly found on her shirt in the mornings. It is sticky and odorless. Her pain varies from a constant dull pain to occasional sharp pains, especially when she has to move her breast, or has been laying down. She has a history of goiter and thyroid cysts she is being followed for by an ENT, but has not been tested in 56yrs (she is due for follow in May). She also reports night sweats, cold intolerance, and constipation.    Review of Systems  Constitutional: Positive for diaphoresis (at night), fatigue and unexpected weight change (has gained a pound).  HENT: Negative.   Eyes: Negative.  Negative for visual disturbance.  Respiratory: Negative.  Negative for shortness of breath.   Cardiovascular: Negative.  Negative for chest pain and palpitations.  Gastrointestinal: Positive for constipation.  Endocrine: Positive for cold intolerance.  Genitourinary: Negative.   Musculoskeletal: Negative.   Skin: Negative.   Allergic/Immunologic: Negative.   Neurological: Negative.   Hematological: Negative.   Psychiatric/Behavioral: Negative.        Objective:   Physical Exam Vitals signs and nursing note reviewed.  HENT:     Head: Normocephalic.  Neck:     Musculoskeletal: Normal range of motion. No muscular tenderness.  Cardiovascular:     Rate and Rhythm: Normal rate.  Pulmonary:     Effort: Pulmonary effort is normal. No respiratory distress.  Chest:     Breasts:        Right: Normal.        Left: Nipple discharge (clear yellow, sticky and thick, with expression) and tenderness (generalized tenderness) present. No swelling, bleeding, inverted nipple or mass.  Skin:  General: Skin is warm and dry.  Neurological:     Mental Status: She is alert.  Psychiatric:        Mood and Affect: Mood normal.        Behavior: Behavior normal.        Thought Content: Thought content normal.        Judgment: Judgment normal.        Assessment & Plan:     .Galactorrhea of left breast  - Prolactin, Thyroid Panel With TSH - Mammogram Digital Screening scheduled for 03/23/2019 - Information provided on galactorrhea  - Advised to F/U with PCP for [facility]

## 2019-02-21 NOTE — Patient Instructions (Signed)
Galactorrhea  Galactorrhea is an abnormal milky discharge from the breast. The discharge may come from one or both nipples. The fluid is often white, yellow, or green. It is different from the normal milk produced in nursing mothers.  Galactorrhea usually occurs in women, but it can sometimes affect men. Various things can cause galactorrhea, such as:  · Irritation of the breast, which can result from injury, stimulation during sexual activity, or clothes rubbing against the nipple.  · Medicines.  · Changes in hormone levels.  In many cases, galactorrhea will go away without treatment. However, galactorrhea can also be a sign of something more serious, such as diseases of the kidney or thyroid, or problems with the pituitary gland. Your health care provider may do various tests to help determine the cause. Sometimes the cause is unknown. It is important to monitor your condition to make sure that it goes away.  Follow these instructions at home:    Breast care  · Watch your condition for any changes.  · Do not squeeze your breasts or nipples.  · Avoid breast stimulation during sexual activity.  · Perform a breast self-exam once a month. Doing this more often can irritate your breasts.  · Avoid clothes that rub on your nipples.  · Use breast pads to absorb the discharge.  · Wear a breast binder or a support bra to help prevent clothes from rubbing on your nipples.  General instructions  · Take over-the-counter and prescription medicines only as told by your health care provider.  · Keep all follow-up visits as told by your health care provider. This is important.  Contact a health care provider if you:  · Develop hot flashes, vaginal dryness, or a lack of sexual desire.  · Stop having menstrual periods, or they are irregular or far apart.  · Have headaches.  · Have vision problems.  Get help right away if you:  · Have breast discharge that is bloody or pus-like.  · Have breast pain.  · Feel a lump in your  breast.  · Have wrinkling or dimpling on your breast.  · Notice that your breast becomes red and swollen.  Summary  · Galactorrhea is an abnormal milky discharge from the breast. The fluid may come from one or both nipples and is often white, yellow, or green.  · Galactorrhea may be caused by various things, such as irritation of the nipples, medicines, or changes in hormone levels.  · Galactorrhea often goes away without treatment. However, it also may be a sign of something more serious, such as diseases of the kidney or thyroid, or problems with the pituitary gland.  · Get help right away if you have discharge that is bloody or pus-like, if you have breast pain or a lump, or if you have skin changes on your breast.  This information is not intended to replace advice given to you by your health care provider. Make sure you discuss any questions you have with your health care provider.  Document Released: 01/15/2005 Document Revised: 11/04/2017 Document Reviewed: 11/04/2017  [employer]vier Interactive Patient Education © 2019 [employer]

## 2019-02-21 NOTE — Progress Notes (Signed)
L breast painful and leaking fld. Unsure of color of drainage. Leaking for 2 months and painful for couple wks. Sharp pain at times and then consistently dull pain. States is "way overdue" for mammogram.

## 2019-02-22 LAB — THYROID PANEL WITH TSH
Free Thyroxine Index: 1.5 (ref 1.2–4.9)
T3 Uptake Ratio: 26 % (ref 24–39)
T4, Total: 5.7 ug/dL (ref 4.5–12.0)
TSH: 1.33 u[IU]/mL (ref 0.450–4.500)

## 2019-02-22 LAB — PROLACTIN: Prolactin: 8 ng/mL (ref 4.8–23.3)

## 2019-02-24 ENCOUNTER — Telehealth: Payer: Self-pay | Admitting: Obstetrics & Gynecology

## 2019-03-04 ENCOUNTER — Encounter (HOSPITAL_COMMUNITY): Payer: Self-pay

## 2019-03-04 ENCOUNTER — Emergency Department (HOSPITAL_COMMUNITY)
Admission: EM | Admit: 2019-03-04 | Discharge: 2019-03-04 | Disposition: A | Discharge status: Home / Self Care | Payer: Medicare Other | Attending: Emergency Medicine | Admitting: Emergency Medicine

## 2019-03-04 ENCOUNTER — Other Ambulatory Visit: Payer: Self-pay

## 2019-03-04 DIAGNOSIS — J209 Acute bronchitis, unspecified: Secondary | ICD-10-CM | POA: Insufficient documentation

## 2019-03-04 DIAGNOSIS — I10 Essential (primary) hypertension: Secondary | ICD-10-CM | POA: Insufficient documentation

## 2019-03-04 DIAGNOSIS — R05 Cough: Secondary | ICD-10-CM | POA: Diagnosis not present

## 2019-03-04 DIAGNOSIS — F17210 Nicotine dependence, cigarettes, uncomplicated: Secondary | ICD-10-CM | POA: Insufficient documentation

## 2019-03-04 DIAGNOSIS — J029 Acute pharyngitis, unspecified: Secondary | ICD-10-CM | POA: Diagnosis present

## 2019-03-04 MED ORDER — AZITHROMYCIN 250 MG PO TABS: 250.0000 mg | ORAL_TABLET | Freq: Every day | ORAL | 0 refills | Status: DC

## 2019-03-04 NOTE — ED Triage Notes (Signed)
Pt reports cough, sore throat X1 week. Voice is hoarse. Maintaining secretions, afebrile in triage.

## 2019-03-04 NOTE — ED Provider Notes (Signed)
[facility] EMERGENCY DEPARTMENT Provider Note   CSN: [id number] Arrival date & time: 03/04/19  1311    History   Chief Complaint Chief Complaint  Patient presents with  . URI    HPI [name] is a 53 y.o. female.     Patient is a 53 year old female presenting with complaints of sore throat, chest congestion, and cough that has worsened over the past week.  She denies any difficulty breathing.  She denies any fevers.  She denies any ill contacts.  The history is provided by the patient.  URI  Presenting symptoms: congestion and cough   Severity:  Moderate Onset quality:  Gradual Duration:  1 week Timing:  Constant Progression:  Worsening Chronicity:  New Relieved by:  Nothing Worsened by:  Nothing Ineffective treatments:  None tried   Past Medical History:  Diagnosis Date  . Anemia   . Fibroids   . Hypertension   . Hypertension    no current meds for htn  . Knee joint injury   . Thyroid goiter     Patient Active Problem List   Diagnosis Date Noted  . Intermittent palpitations 11/17/2017  . Menopause 06/23/2017  . Hot flashes 09/23/2016  . Healthcare maintenance 09/23/2016  . S/P total knee arthroplasty, 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  . Uterine fibroid 05/09/2013  . Tobacco use disorder 04/12/2013  . OA (osteoarthritis) 10/28/2011  . 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 MORBID OBESITY 09/17/2009  . Essential hypertension 09/17/2009  . Pain in limb 09/17/2009  . ANEMIA, HX OF 09/17/2009    Past Surgical History:  Procedure Laterality Date  . ANKLE SURGERY Right 2009   broken ankle   . arthroscopic surgery to knee Bilateral   . CESAREAN SECTION    . HAND SURGERY    . JOINT REPLACEMENT Bilateral    knees  . REPLACEMENT TOTAL KNEE Right 2012  . TOTAL KNEE ARTHROPLASTY Left 01/03/2014   Procedure: LEFT TOTAL KNEE ARTHROPLASTY;  Surgeon: Tobi Bastos, MD;  Location: [facility];  Service: Orthopedics;  Laterality: Left;  . TUBAL LIGATION        OB History    Gravida  4   Para  2   Term  2   Preterm  0   AB  2   Living  2     SAB  1   TAB  1   Ectopic  0   Multiple      Live Births  2            Home Medications    Prior to Admission medications   Medication Sig Start Date End Date Taking? Authorizing Provider  atenolol (TENORMIN) 50 MG tablet Take 1 tablet (50 mg total) by mouth daily. 12/08/17 02/06/18  Molt, Bethany, DO  baclofen (LIORESAL) 10 MG tablet Take 1 tablet (10 mg total) by mouth 3 (three) times daily. Patient not taking: Reported on 02/21/2019 10/11/17   Margarita Mail, PA-C  celecoxib (CELEBREX) 200 MG capsule Take 1 capsule (200 mg total) by mouth 2 (two) times daily. Patient not taking: Reported on 02/21/2019 10/11/17   Margarita Mail, PA-C  erythromycin with ethanol (EMGEL) 2 % gel Apply to affected area 2 times daily. Patient not taking: Reported on 02/21/2019 10/22/17   Evelina Bucy, DPM  fluticasone [facility]) 50 MCG/ACT nasal spray Place 1 spray into both nostrils daily. Patient not taking: Reported on 02/21/2019 11/23/18   Petrucelli, Samantha R, PA-C  lidocaine (XYLOCAINE) 2 % solution  Use as directed 15 mLs in the mouth or throat every 6 (six) hours as needed. Patient not taking: Reported on 02/21/2019 03/17/18   Pisciotta, Elmyra Ricks, PA-C  methocarbamol (ROBAXIN) 500 MG tablet Take 1 tablet (500 mg total) by mouth every 8 (eight) hours as needed. Patient not taking: Reported on 02/21/2019 01/17/19   Suella Broad A, PA-C  naproxen sodium (ALEVE) 220 MG tablet Take 220 mg by mouth daily as needed (Takes 3 at a time).     [provider]  ondansetron (ZOFRAN) 4 MG tablet Take 1 tablet (4 mg total) by mouth every 8 (eight) hours as needed for nausea or vomiting. Patient not taking: Reported on 02/21/2019 01/22/19   Mesner, Corene Cornea, MD  oxyCODONE-acetaminophen (PERCOCET) 5-325 MG tablet Take 1 tablet by mouth every 8 (eight) hours as needed for severe pain. Patient not taking: Reported on  02/21/2019 01/22/19   Mesner, Corene Cornea, MD  predniSONE (DELTASONE) 50 MG tablet Take 1 tablet daily with breakfast Patient not taking: Reported on 02/21/2019 03/17/18   Pisciotta, Elmyra Ricks, PA-C  VOLTAREN 1 % GEL APPLY 2GM TOPICALLY FOUR TIMES DAILY AS DIRECTED Patient not taking: Reported on 02/21/2019 01/25/18   Molt, Bethany, DO    Family History Family History  Problem Relation Age of Onset  . Hypertension Mother   . Hypertension Father   . Cancer Maternal Grandmother        ? lung    Social History Social History   Tobacco Use  . Smoking status: Current Every Day Smoker    Packs/day: 0.20    Years: 0.00    Pack years: 0.00    Types: Cigarettes  . Smokeless tobacco: Never Used  . Tobacco comment: 1-2  cigs per day  Substance Use Topics  . Alcohol use: Yes    Alcohol/week: 2.0 standard drinks    Types: 2 Standard drinks or equivalent per week    Comment: occasionally once per month  . Drug use: No     Allergies   Tramadol; Ibuprofen; and Metronidazole   Review of Systems Review of Systems  HENT: Positive for congestion.   Respiratory: Positive for cough.   All other systems reviewed and are negative.    Physical Exam Updated Vital Signs BP (!) 180/102 (BP Location: Right Arm)   Pulse 64   Temp 98 F (36.7 C) (Oral)   Resp 18   SpO2 100%   Physical Exam Vitals signs and nursing note reviewed.  Constitutional:      General: She is not in acute distress.    Appearance: She is well-developed. She is not diaphoretic.  HENT:     Head: Normocephalic and atraumatic.     Mouth/Throat:     Mouth: Mucous membranes are moist.     Pharynx: Posterior oropharyngeal erythema present. No oropharyngeal exudate.  Neck:     Musculoskeletal: Normal range of motion and neck supple.  Cardiovascular:     Rate and Rhythm: Normal rate and regular rhythm.     Heart sounds: No murmur. No friction rub. No gallop.   Pulmonary:     Effort: Pulmonary effort is normal. No respiratory  distress.     Breath sounds: Normal breath sounds. No wheezing.  Abdominal:     General: Bowel sounds are normal. There is no distension.     Palpations: Abdomen is soft.     Tenderness: There is no abdominal tenderness.  Musculoskeletal: Normal range of motion.  Skin:    General: Skin is warm and  dry.  Neurological:     Mental Status: She is alert and oriented to person, place, and time.      ED Treatments / Results  Labs (all labs ordered are listed, but only abnormal results are displayed) Labs Reviewed - No data to display  EKG None  Radiology No results found.  Procedures Procedures (including critical care time)  Medications Ordered in ED Medications - No data to display   Initial Impression / Assessment and Plan / ED Course  I have reviewed the triage vital signs and the nursing notes.  Pertinent labs & imaging results that were available during my care of the patient were reviewed by me and considered in my medical decision making (see chart for details).  Patient presenting here with URI symptoms for greater than 1 week that are worsening rather than improving.  Patient will be treated for bronchitis with Zithromax.  She is to follow-up as needed if not improving.  Final Clinical Impressions(s) / ED Diagnoses   Final diagnoses:  None    ED Discharge Orders    None       Veryl Speak, MD 03/04/19 1421

## 2019-03-04 NOTE — Discharge Instructions (Addendum)
Zithromax as prescribed.  Continue over-the-counter medications as needed for relief of symptoms.  Return to the emergency department for severe chest pain, difficulty breathing, or other new and concerning symptoms.

## 2019-03-14 ENCOUNTER — Telehealth: Payer: Self-pay | Admitting: Obstetrics & Gynecology

## 2019-03-14 NOTE — Telephone Encounter (Signed)
Called the patient to inform of the cancellation of her appointment. Left a detailed voicemail with the scripting information by provided.

## 2019-03-14 NOTE — Telephone Encounter (Signed)
Also left a message with her father to call our clinic.

## 2019-03-15 ENCOUNTER — Ambulatory Visit: Payer: Medicare Other | Admitting: Obstetrics & Gynecology

## 2019-03-15 ENCOUNTER — Telehealth: Payer: Self-pay | Admitting: Obstetrics & Gynecology

## 2019-03-15 NOTE — Telephone Encounter (Signed)
Informed the patient of the restrictions as well as the cancellation of the appointment.

## 2019-03-15 NOTE — Telephone Encounter (Signed)
Called pt and informed pt that her test results are normal. Pt asked well what about my breast leaking. I recommended that the pt call and schedule an appt with a provider after she has her mammogram appt so that way the provider will be able to assist her better with her breast leaking.  Pt agreed.

## 2019-03-15 NOTE — Telephone Encounter (Signed)
Patient called requesting results from her appointment she had in February.

## 2019-03-16 ENCOUNTER — Telehealth: Payer: Self-pay | Admitting: Obstetrics & Gynecology

## 2019-03-16 DIAGNOSIS — N643 Galactorrhea not associated with childbirth: Secondary | ICD-10-CM

## 2019-03-16 NOTE — Telephone Encounter (Signed)
Patient called about her mammogram, and getting it scheduled.

## 2019-03-17 ENCOUNTER — Other Ambulatory Visit: Payer: Self-pay | Admitting: Obstetrics and Gynecology

## 2019-03-17 DIAGNOSIS — N643 Galactorrhea not associated with childbirth: Secondary | ICD-10-CM

## 2019-03-17 NOTE — Addendum Note (Signed)
Addended by: Alric Seton on: 03/17/2019 03:02 PM   Modules accepted: Orders

## 2019-03-17 NOTE — Telephone Encounter (Signed)
Called pt about mammogram screening, appointment was schedule for 03/23/2019 at [place] looks like they cancelled all patients and will call to reschedule, Pt stated that she needed a different order placed that is why the appointment was cancelled.  Advised patient that I will add the correct number and call the breast center to get her rescheduled for 03/23/2019. Spoke with  Judson Roch at the breast center and was able to schedule for 03/23/2019 920am arrival and to prepare to be there for 70mins. Pt verbalized understanding.

## 2019-03-23 ENCOUNTER — Ambulatory Visit: Payer: Medicare Other

## 2019-03-23 ENCOUNTER — Other Ambulatory Visit: Payer: Self-pay

## 2019-03-23 ENCOUNTER — Ambulatory Visit
Admission: RE | Admit: 2019-03-23 | Discharge: 2019-03-23 | Disposition: A | Discharge status: Home / Self Care | Payer: Medicare Other | Source: Ambulatory Visit | Attending: Obstetrics and Gynecology | Admitting: Obstetrics and Gynecology

## 2019-03-23 ENCOUNTER — Telehealth: Payer: Self-pay | Admitting: Internal Medicine

## 2019-03-23 ENCOUNTER — Other Ambulatory Visit: Payer: Self-pay | Admitting: Obstetrics and Gynecology

## 2019-03-23 ENCOUNTER — Encounter: Payer: Medicare Other | Admitting: Internal Medicine

## 2019-03-23 DIAGNOSIS — N643 Galactorrhea not associated with childbirth: Secondary | ICD-10-CM

## 2019-03-23 DIAGNOSIS — N6320 Unspecified lump in the left breast, unspecified quadrant: Secondary | ICD-10-CM

## 2019-03-23 NOTE — Telephone Encounter (Signed)
Attempted calling patient for a tele-visit. No answer, left a message letting the patient know to call the clinic if she needs anything.

## 2019-03-24 ENCOUNTER — Ambulatory Visit
Admission: RE | Admit: 2019-03-24 | Discharge: 2019-03-24 | Disposition: A | Discharge status: Home / Self Care | Payer: Medicare Other | Source: Ambulatory Visit | Attending: Obstetrics and Gynecology | Admitting: Obstetrics and Gynecology

## 2019-03-24 ENCOUNTER — Other Ambulatory Visit: Payer: Self-pay | Admitting: Obstetrics and Gynecology

## 2019-03-24 DIAGNOSIS — N6321 Unspecified lump in the left breast, upper outer quadrant: Secondary | ICD-10-CM | POA: Diagnosis not present

## 2019-03-24 DIAGNOSIS — N6320 Unspecified lump in the left breast, unspecified quadrant: Secondary | ICD-10-CM

## 2019-03-24 DIAGNOSIS — N643 Galactorrhea not associated with childbirth: Secondary | ICD-10-CM

## 2019-03-24 DIAGNOSIS — N6012 Diffuse cystic mastopathy of left breast: Secondary | ICD-10-CM | POA: Diagnosis not present

## 2019-03-24 HISTORY — PX: BREAST BIOPSY: SHX20

## 2019-04-08 DIAGNOSIS — N6452 Nipple discharge: Secondary | ICD-10-CM | POA: Diagnosis not present

## 2019-04-14 ENCOUNTER — Other Ambulatory Visit: Payer: Self-pay | Admitting: Surgery

## 2019-04-14 DIAGNOSIS — N6452 Nipple discharge: Secondary | ICD-10-CM

## 2019-05-27 ENCOUNTER — Other Ambulatory Visit: Payer: Medicare Other

## 2019-06-15 ENCOUNTER — Ambulatory Visit
Admission: RE | Admit: 2019-06-15 | Discharge: 2019-06-15 | Disposition: A | Discharge status: Home / Self Care | Payer: Medicare Other | Source: Ambulatory Visit | Attending: Surgery | Admitting: Surgery

## 2019-06-15 ENCOUNTER — Other Ambulatory Visit: Payer: Self-pay

## 2019-06-15 DIAGNOSIS — N6452 Nipple discharge: Secondary | ICD-10-CM

## 2019-06-15 MED ORDER — GADOBUTROL 1 MMOL/ML IV SOLN
10.0000 mL | Freq: Once | INTRAVENOUS | Status: AC | PRN
  Administered 2019-06-15: 10 mL via INTRAVENOUS

## 2019-06-19 ENCOUNTER — Encounter: Payer: Self-pay | Admitting: *Deleted

## 2019-07-19 ENCOUNTER — Encounter (HOSPITAL_COMMUNITY): Payer: Self-pay | Admitting: Emergency Medicine

## 2019-07-19 ENCOUNTER — Emergency Department (HOSPITAL_COMMUNITY)
Admission: EM | Admit: 2019-07-19 | Discharge: 2019-07-19 | Disposition: A | Discharge status: Home / Self Care | Payer: Medicare Other | Attending: Emergency Medicine | Admitting: Emergency Medicine

## 2019-07-19 ENCOUNTER — Emergency Department (HOSPITAL_COMMUNITY): Payer: Medicare Other

## 2019-07-19 ENCOUNTER — Other Ambulatory Visit: Payer: Self-pay

## 2019-07-19 DIAGNOSIS — F17210 Nicotine dependence, cigarettes, uncomplicated: Secondary | ICD-10-CM | POA: Diagnosis not present

## 2019-07-19 DIAGNOSIS — J029 Acute pharyngitis, unspecified: Secondary | ICD-10-CM

## 2019-07-19 DIAGNOSIS — M19011 Primary osteoarthritis, right shoulder: Secondary | ICD-10-CM | POA: Diagnosis not present

## 2019-07-19 DIAGNOSIS — R51 Headache: Secondary | ICD-10-CM | POA: Diagnosis not present

## 2019-07-19 DIAGNOSIS — I10 Essential (primary) hypertension: Secondary | ICD-10-CM | POA: Insufficient documentation

## 2019-07-19 DIAGNOSIS — Z20828 Contact with and (suspected) exposure to other viral communicable diseases: Secondary | ICD-10-CM | POA: Diagnosis not present

## 2019-07-19 DIAGNOSIS — Z79899 Other long term (current) drug therapy: Secondary | ICD-10-CM | POA: Diagnosis not present

## 2019-07-19 DIAGNOSIS — M25511 Pain in right shoulder: Secondary | ICD-10-CM | POA: Insufficient documentation

## 2019-07-19 MED ORDER — ACETAMINOPHEN 325 MG PO TABS
650.0000 mg | ORAL_TABLET | Freq: Once | ORAL | Status: AC
  Administered 2019-07-19: 650 mg via ORAL
  Filled 2019-07-19: qty 2

## 2019-07-19 MED ORDER — LIDOCAINE VISCOUS HCL 2 % MT SOLN: 15.0000 mL | OROMUCOSAL | 0 refills | Status: DC | PRN

## 2019-07-19 NOTE — ED Notes (Signed)
Patient transported to X-ray 

## 2019-07-19 NOTE — ED Provider Notes (Signed)
[facility] EMERGENCY DEPARTMENT Provider Note   CSN: [id number] Arrival date & time: 07/19/19  0807    History   Chief Complaint Chief Complaint  Patient presents with  . Sore Throat    HPI [name] is a 53 y.o. female with past medical history of anemia, fibroids, hypertension, thyroid goiter presents emergency department today with chief complaint of headache and sore throat x2 days.  Patient describes her throat as feeling sore when swallowing.  She also has a mild headache.  She states this feels like her usual headache.  She denies sudden onset.  Pain is located over the back of her head, she rates it 2 out of 10 in severity. She has not taken anything for pain prior to arrival.  She reports she has had multiple coworkers tested positive for coronavirus and she is concerned she has it. Denies fever, chills, voice change, inability to control secretions, nausea/vomiting, facial swelling, dysphagia, odynophagia, visual changes, neck pain, rash.   Patient is also reporting right shoulder pain x2 weeks.  She describes the pain as aching and states pain is worse with movement. Pain does not radiate. She denies injury, fall, trauma, numbness, weakness.   History provided by patient with additional history obtained from chart review.     Past Medical History:  Diagnosis Date  . Anemia   . Fibroids   . Hypertension   . Hypertension    no current meds for htn  . Knee joint injury   . Thyroid goiter     Patient Active Problem List   Diagnosis Date Noted  . Intermittent palpitations 11/17/2017  . Menopause 06/23/2017  . Hot flashes 09/23/2016  . Healthcare maintenance 09/23/2016  . S/P total knee arthroplasty, 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  . Uterine fibroid 05/09/2013  . Tobacco use disorder 04/12/2013  . OA (osteoarthritis) 10/28/2011  . 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 MORBID OBESITY 09/17/2009  . Essential hypertension 09/17/2009  . Pain in limb  09/17/2009  . ANEMIA, HX OF 09/17/2009    Past Surgical History:  Procedure Laterality Date  . ANKLE SURGERY Right 2009   broken ankle   . arthroscopic surgery to knee Bilateral   . CESAREAN SECTION    . HAND SURGERY    . JOINT REPLACEMENT Bilateral    knees  . REPLACEMENT TOTAL KNEE Right 2012  . TOTAL KNEE ARTHROPLASTY Left 01/03/2014   Procedure: LEFT TOTAL KNEE ARTHROPLASTY;  Surgeon: Tobi Bastos, MD;  Location: [facility];  Service: Orthopedics;  Laterality: Left;  . TUBAL LIGATION       OB History    Gravida  4   Para  2   Term  2   Preterm  0   AB  2   Living  2     SAB  1   TAB  1   Ectopic  0   Multiple      Live Births  2            Home Medications    Prior to Admission medications   Medication Sig Start Date End Date Taking? Authorizing Provider  atenolol (TENORMIN) 50 MG tablet Take 1 tablet (50 mg total) by mouth daily. 12/08/17 02/06/18  Molt, Bethany, DO  azithromycin (ZITHROMAX) 250 MG tablet Take 1 tablet (250 mg total) by mouth daily. Take first 2 tablets together, then 1 every day until finished. 03/04/19   Veryl Speak, MD  baclofen (LIORESAL) 10 MG tablet Take 1 tablet (  10 mg total) by mouth 3 (three) times daily. Patient not taking: Reported on 02/21/2019 10/11/17   Margarita Mail, PA-C  celecoxib (CELEBREX) 200 MG capsule Take 1 capsule (200 mg total) by mouth 2 (two) times daily. Patient not taking: Reported on 02/21/2019 10/11/17   Margarita Mail, PA-C  erythromycin with ethanol (EMGEL) 2 % gel Apply to affected area 2 times daily. Patient not taking: Reported on 02/21/2019 10/22/17   Evelina Bucy, DPM  fluticasone [facility]) 50 MCG/ACT nasal spray Place 1 spray into both nostrils daily. Patient not taking: Reported on 02/21/2019 11/23/18   Petrucelli, Samantha R, PA-C  lidocaine (XYLOCAINE) 2 % solution Use as directed 15 mLs in the mouth or throat as needed for mouth pain. 07/19/19   Fed Ceci E, PA-C  methocarbamol  (ROBAXIN) 500 MG tablet Take 1 tablet (500 mg total) by mouth every 8 (eight) hours as needed. Patient not taking: Reported on 02/21/2019 01/17/19   Suella Broad A, PA-C  naproxen sodium (ALEVE) 220 MG tablet Take 220 mg by mouth daily as needed (Takes 3 at a time).     [provider]  ondansetron (ZOFRAN) 4 MG tablet Take 1 tablet (4 mg total) by mouth every 8 (eight) hours as needed for nausea or vomiting. Patient not taking: Reported on 02/21/2019 01/22/19   Mesner, Corene Cornea, MD  oxyCODONE-acetaminophen (PERCOCET) 5-325 MG tablet Take 1 tablet by mouth every 8 (eight) hours as needed for severe pain. Patient not taking: Reported on 02/21/2019 01/22/19   Mesner, Corene Cornea, MD  predniSONE (DELTASONE) 50 MG tablet Take 1 tablet daily with breakfast Patient not taking: Reported on 02/21/2019 03/17/18   Pisciotta, Elmyra Ricks, PA-C  VOLTAREN 1 % GEL APPLY 2GM TOPICALLY FOUR TIMES DAILY AS DIRECTED Patient not taking: Reported on 02/21/2019 01/25/18   Molt, Bethany, DO    Family History Family History  Problem Relation Age of Onset  . Hypertension Mother   . Hypertension Father   . Cancer Maternal Grandmother        ? lung    Social History Social History   Tobacco Use  . Smoking status: Current Every Day Smoker    Packs/day: 0.20    Years: 0.00    Pack years: 0.00    Types: Cigarettes  . Smokeless tobacco: Never Used  . Tobacco comment: 1-2  cigs per day  Substance Use Topics  . Alcohol use: Yes    Alcohol/week: 2.0 standard drinks    Types: 2 Standard drinks or equivalent per week    Comment: occasionally once per month  . Drug use: No     Allergies   Tramadol, Ibuprofen, and Metronidazole   Review of Systems Review of Systems  Constitutional: Negative for chills and fever.  HENT: Positive for congestion and sore throat. Negative for ear pain, facial swelling, sinus pressure and sinus pain.   Respiratory: Negative for cough, chest tightness, shortness of breath and wheezing.    Cardiovascular: Negative for chest pain, palpitations and leg swelling.  Gastrointestinal: Negative for abdominal pain, diarrhea and vomiting.  Neurological: Positive for headaches. Negative for dizziness, weakness and numbness.     Physical Exam Updated Vital Signs BP (!) 146/89   Pulse 90   Temp 98.1 F (36.7 C) (Oral)   Resp 16   Ht 5\' 6"  (1.676 m)   SpO2 98%   BMI 38.58 kg/m   Physical Exam Vitals signs and nursing note reviewed.  Constitutional:      General: She is not in  acute distress.    Appearance: She is not ill-appearing.  HENT:     Head: Normocephalic and atraumatic.     Right Ear: Tympanic membrane and external ear normal.     Left Ear: Tympanic membrane and external ear normal.     Nose: Nose normal.     Mouth/Throat:     Mouth: Mucous membranes are moist.     Pharynx: Oropharynx is clear.     Comments: No erythema to oropharynx, no edema, no exudate, no tonsillar swelling, voice normal, neck supple without lymphadenopathy  Eyes:     General: No scleral icterus.       Right eye: No discharge.        Left eye: No discharge.     Extraocular Movements: Extraocular movements intact.     Conjunctiva/sclera: Conjunctivae normal.     Pupils: Pupils are equal, round, and reactive to light.  Neck:     Musculoskeletal: Normal range of motion. No edema.     Thyroid: No thyromegaly.     Vascular: No JVD.  Cardiovascular:     Rate and Rhythm: Normal rate and regular rhythm.     Pulses: Normal pulses.          Radial pulses are 2+ on the right side and 2+ on the left side.     Heart sounds: Normal heart sounds.  Pulmonary:     Comments: Lungs clear to auscultation in all fields. Symmetric chest rise. No wheezing, rales, or rhonchi. Abdominal:     Comments: Abdomen is soft, non-distended, and non-tender in all quadrants. No rigidity, no guarding. No peritoneal signs.  Musculoskeletal:     Comments: right shoulder with tenderness to palpation. Full passive ROM.  No swelling, erythema or ecchymosis present. No step-off, crepitus, or deformity appreciated. 5/5 muscle strength of RUE. 2+ radial pulse, sensation intact and all compartments soft.   Skin:    General: Skin is warm and dry.     Capillary Refill: Capillary refill takes less than 2 seconds.  Neurological:     Mental Status: She is oriented to person, place, and time.     GCS: GCS eye subscore is 4. GCS verbal subscore is 5. GCS motor subscore is 6.     Comments: Fluent speech, no facial droop.  Psychiatric:        Behavior: Behavior normal.      ED Treatments / Results  Labs (all labs ordered are listed, but only abnormal results are displayed) Labs Reviewed  NOVEL CORONAVIRUS, NAA (HOSPITAL ORDER, SEND-OUT TO REF LAB)    EKG None  Radiology Dg Shoulder Right  Result Date: 07/19/2019 CLINICAL DATA:  Shoulder pain 3 weeks EXAM: RIGHT SHOULDER - 2+ VIEW COMPARISON:  None. FINDINGS: Normal alignment no fracture. Mild degenerative change in the [facility] joint. Shoulder joint normal. IMPRESSION: Mild degenerative change AC joint. Electronically Signed   By: Franchot Gallo M.D.   On: 07/19/2019 10:13    Procedures Procedures (including critical care time)  Medications Ordered in ED Medications  acetaminophen (TYLENOL) tablet 650 mg (650 mg Oral Given 07/19/19 0954)     Initial Impression / Assessment and Plan / ED Course  I have reviewed the triage vital signs and the nursing notes.  Pertinent labs & imaging results that were available during my care of the patient were reviewed by me and considered in my medical decision making (see chart for details).  53 yo female who presents with  sore throat. Still able to tolerate  PO/secretions but with worsening pain. Patient is afebrile, non-toxic appearing, sitting comfortably on examination table. Vital signs reviewed and stable. On exam, no erythema, exudate, tonsillar swelling noted. Centor sore of 0, making strep unlikely. Presentation  not concerning for PTA or Ludwig's angina, Uvulitis, epiglottitis, peritonsillar abscess, or retropharyngeal abscess. She has had covid exposures at work, send out test performed. Right shoulder with tenderness to palpation, no obvious deformity. Neurovascularly intact, compartments above and below soft. Xray of right shoulder viewed by me without evidence of fracture or dislocation. Pt given PO tylenol and reports pain improved on reassessment. Discussed results with patient. Encouraged at home supportive care measures.  Patient had ample opportunity for questions and discussion. All patient's questions were answered with full understanding. Patient expresses understanding and agreement to plan. Strict return precautions given. NAD. VSS. Recommended PCP follow up for re-evaluation.    [name] was evaluated in Emergency Department on 07/19/2019 for the symptoms described in the history of present illness. She was evaluated in the context of the global COVID-19 pandemic, which necessitated consideration that the patient might be at risk for infection with the SARS-CoV-2 virus that causes COVID-19. Institutional protocols and algorithms that pertain to the evaluation of patients at risk for COVID-19 are in a state of rapid change based on information released by regulatory bodies including the CDC and federal and state organizations. These policies and algorithms were followed during the patient's care in the ED.   This note was prepared using Dragon voice recognition software and may include unintentional dictation errors due to the inherent limitations of voice recognition software.    Final Clinical Impressions(s) / ED Diagnoses   Final diagnoses:  Sore throat  Acute pain of right shoulder    ED Discharge Orders         Ordered    lidocaine (XYLOCAINE) 2 % solution  As needed     07/19/19 1056           Taeveon Keesling, [place], PA-C 07/19/19 2205    Lennice Sites, DO 07/20/19  [phone]

## 2019-07-19 NOTE — Discharge Instructions (Addendum)
You have been seen today for sore throat, headache, right shoulder pain. Please read and follow all provided instructions. Return to the emergency room for worsening condition or new concerning symptoms.    Your coronavirus test is pending. please self quarantine until you have the results.  1. Medications:  Continue to swish and spit with warm salt water as needed.  You can swallow 15 mL of viscous lidocaine every 3 hours as needed for sore throat.  Please take 650 mg of Tylenol once every 6 hours for pain.  Eating hot and cold food and beverages may be less painful than room temperature food and beverages.  -You can also try Voltaren gel on your shoulder for pain as needed. This is an over the counter medication.  Continue usual home medications Take medications as prescribed. Please review all of the medicines and only take them if you do not have an allergy to them.  2. Treatment: rest, drink plenty of fluids 3. Follow Up: Please follow up with your primary doctor in 2-5 days for discussion of your diagnoses and further evaluation after today's visit; Call today to arrange your follow up.  -These follow-up with your primary care doctor to have your thyroid checked  It is also a possibility that you have an allergic reaction to any of the medicines that you have been prescribed - Everybody reacts differently to medications and while MOST people have no trouble with most medicines, you may have a reaction such as nausea, vomiting, rash, swelling, shortness of breath. If this is the case, please stop taking the medicine immediately and contact your physician.  ?

## 2019-07-19 NOTE — ED Triage Notes (Signed)
Pt reports headache, stuffy nose and sore throat that began yesterday. Denies fever or chills. No known sick contacts

## 2019-07-20 LAB — NOVEL CORONAVIRUS, NAA (HOSP ORDER, SEND-OUT TO REF LAB; TAT 18-24 HRS): SARS-CoV-2, NAA: NOT DETECTED

## 2019-08-01 DIAGNOSIS — N898 Other specified noninflammatory disorders of vagina: Secondary | ICD-10-CM | POA: Diagnosis not present

## 2019-08-01 DIAGNOSIS — I10 Essential (primary) hypertension: Secondary | ICD-10-CM | POA: Diagnosis not present

## 2019-08-01 DIAGNOSIS — Z79899 Other long term (current) drug therapy: Secondary | ICD-10-CM | POA: Diagnosis not present

## 2019-08-01 DIAGNOSIS — N390 Urinary tract infection, site not specified: Secondary | ICD-10-CM | POA: Diagnosis not present

## 2019-08-01 DIAGNOSIS — Z7251 High risk heterosexual behavior: Secondary | ICD-10-CM | POA: Diagnosis not present

## 2019-08-01 DIAGNOSIS — F17210 Nicotine dependence, cigarettes, uncomplicated: Secondary | ICD-10-CM | POA: Diagnosis not present

## 2019-08-01 DIAGNOSIS — R300 Dysuria: Secondary | ICD-10-CM | POA: Diagnosis not present

## 2019-08-04 DIAGNOSIS — N390 Urinary tract infection, site not specified: Secondary | ICD-10-CM | POA: Diagnosis not present

## 2019-08-15 DIAGNOSIS — M25511 Pain in right shoulder: Secondary | ICD-10-CM | POA: Diagnosis not present

## 2019-08-15 DIAGNOSIS — M542 Cervicalgia: Secondary | ICD-10-CM | POA: Diagnosis not present

## 2019-08-19 DIAGNOSIS — E669 Obesity, unspecified: Secondary | ICD-10-CM | POA: Diagnosis not present

## 2019-08-19 DIAGNOSIS — I10 Essential (primary) hypertension: Secondary | ICD-10-CM | POA: Diagnosis not present

## 2019-08-19 DIAGNOSIS — Z720 Tobacco use: Secondary | ICD-10-CM | POA: Diagnosis not present

## 2019-08-19 DIAGNOSIS — N179 Acute kidney failure, unspecified: Secondary | ICD-10-CM | POA: Diagnosis not present

## 2019-09-01 DIAGNOSIS — M25511 Pain in right shoulder: Secondary | ICD-10-CM | POA: Diagnosis not present

## 2019-09-01 DIAGNOSIS — N179 Acute kidney failure, unspecified: Secondary | ICD-10-CM | POA: Diagnosis not present

## 2019-09-12 DIAGNOSIS — M25511 Pain in right shoulder: Secondary | ICD-10-CM | POA: Diagnosis not present

## 2019-09-23 ENCOUNTER — Other Ambulatory Visit: Payer: Self-pay | Admitting: Surgery

## 2019-09-23 DIAGNOSIS — M25511 Pain in right shoulder: Secondary | ICD-10-CM | POA: Diagnosis not present

## 2019-09-23 DIAGNOSIS — N6452 Nipple discharge: Secondary | ICD-10-CM

## 2019-09-23 DIAGNOSIS — M75121 Complete rotator cuff tear or rupture of right shoulder, not specified as traumatic: Secondary | ICD-10-CM | POA: Diagnosis not present

## 2019-09-27 ENCOUNTER — Other Ambulatory Visit: Payer: Medicare Other

## 2019-09-28 DIAGNOSIS — N179 Acute kidney failure, unspecified: Secondary | ICD-10-CM | POA: Diagnosis not present

## 2019-09-28 DIAGNOSIS — I10 Essential (primary) hypertension: Secondary | ICD-10-CM | POA: Diagnosis not present

## 2019-09-28 DIAGNOSIS — E669 Obesity, unspecified: Secondary | ICD-10-CM | POA: Diagnosis not present

## 2019-09-28 DIAGNOSIS — E559 Vitamin D deficiency, unspecified: Secondary | ICD-10-CM | POA: Diagnosis not present

## 2019-09-28 DIAGNOSIS — Z720 Tobacco use: Secondary | ICD-10-CM | POA: Diagnosis not present

## 2019-10-07 NOTE — Patient Instructions (Addendum)
DUE TO COVID-19 ONLY ONE VISITOR IS ALLOWED TO COME WITH YOU AND STAY IN THE WAITING ROOM ONLY DURING PRE OP AND PROCEDURE. THE ONE VISITOR MAY VISIT WITH YOU IN YOUR PRIVATE ROOM DURING VISITING HOURS ONLY!!   COVID SWAB TESTING COMPLETED ON:  Saturday, Oct. 17, 2020   (Must self quarantine after testing. Follow instructions on handout.)             Your procedure is scheduled on: Wednesday, Oct. 21, 2020   Report to [facility] Main  Entrance    Report to admitting at 6:30 AM   Call this number if you have problems the morning of surgery [phone]   Do not eat food :After Midnight.   May have liquids until 5:30AM day of surgery   CLEAR LIQUID DIET  Foods Allowed                                                                     Foods Excluded  Water, Black Coffee and tea, regular and decaf                             liquids that you cannot  Plain Jell-O in any flavor  (No red)                                           see through such as: Fruit ices (not with fruit pulp)                                     milk, soups, orange juice  Iced Popsicles (No red)                                    All solid food Carbonated beverages, regular and diet                                    Apple juices Sports drinks like Gatorade (No red) Lightly seasoned clear broth or consume(fat free) Sugar, honey syrup  Sample Menu Breakfast                                Lunch                                     Supper Cranberry juice                    Beef broth                            Chicken broth Jell-O  Grape juice                           Apple juice Coffee or tea                        Jell-O                                      Popsicle                                                Coffee or tea                        Coffee or tea   Complete one Ensure drink the morning of surgery at 5:30AM the day of surgery.   Brush your teeth the  morning of surgery.   Do NOT smoke after Midnight   Take these medicines the morning of surgery with A SIP OF WATER: None                               You may not have any metal on your body including hair pins, jewelry, and body piercings             Do not wear make-up, lotions, powders, perfumes/cologne, or deodorant             Do not wear nail polish.  Do not shave  48 hours prior to surgery.             Do not bring valuables to the hospital. [place].   Contacts, dentures or bridgework may not be worn into surgery.   Bring small overnight bag day of surgery.   Special Instructions: Bring a copy of your healthcare power of attorney and living will documents         the day of surgery if you haven't scanned them in before.              Please read over the following fact sheets you were given:  [facility] - Preparing for Surgery Before surgery, you can play an important role.  Because skin is not sterile, your skin needs to be as free of germs as possible.  You can reduce the number of germs on your skin by washing with CHG (chlorahexidine gluconate) soap before surgery.  CHG is an antiseptic cleaner which kills germs and bonds with the skin to continue killing germs even after washing. Please DO NOT use if you have an allergy to CHG or antibacterial soaps.  If your skin becomes reddened/irritated stop using the CHG and inform your nurse when you arrive at Short Stay. Do not shave (including legs and underarms) for at least 48 hours prior to the first CHG shower.  You may shave your face/neck.  Please follow these instructions carefully:  1.  Shower with CHG Soap the night before surgery and the  morning of surgery.  2.  If you choose to wash your hair, wash your hair  first as usual with your normal  shampoo.  3.  After you shampoo, rinse your hair and body thoroughly to remove the shampoo.                             4.  Use CHG  as you would any other liquid soap.  You can apply chg directly to the skin and wash.  Gently with a scrungie or clean washcloth.  5.  Apply the CHG Soap to your body ONLY FROM THE NECK DOWN.   Do   not use on face/ open                           Wound or open sores. Avoid contact with eyes, ears mouth and   genitals (private parts).                       Wash face,  Genitals (private parts) with your normal soap.             6.  Wash thoroughly, paying special attention to the area where your    surgery  will be performed.  7.  Thoroughly rinse your body with warm water from the neck down.  8.  DO NOT shower/wash with your normal soap after using and rinsing off the CHG Soap.                9.  Pat yourself dry with a clean towel.            10.  Wear clean pajamas.            11.  Place clean sheets on your bed the night of your first shower and do not  sleep with pets. Day of Surgery : Do not apply any lotions/deodorants the morning of surgery.  Please wear clean clothes to the hospital/surgery center.  FAILURE TO FOLLOW THESE INSTRUCTIONS MAY RESULT IN THE CANCELLATION OF YOUR SURGERY  PATIENT SIGNATURE_________________________________  NURSE SIGNATURE__________________________________  ________________________________________________________________________  [name]  An incentive spirometer is a tool that can help keep your lungs clear and active. This tool measures how well you are filling your lungs with each breath. Taking long deep breaths may help reverse or decrease the chance of developing breathing (pulmonary) problems (especially infection) following:  A long period of time when you are unable to move or be active. BEFORE THE PROCEDURE   If the spirometer includes an indicator to show your best effort, your nurse or respiratory therapist will set it to a desired goal.  If possible, sit up straight or lean slightly forward. Try not to slouch.  Hold the  incentive spirometer in an upright position. INSTRUCTIONS FOR USE  1. Sit on the edge of your bed if possible, or sit up as far as you can in bed or on a chair. 2. Hold the incentive spirometer in an upright position. 3. Breathe out normally. 4. Place the mouthpiece in your mouth and seal your lips tightly around it. 5. Breathe in slowly and as deeply as possible, raising the piston or the ball toward the top of the column. 6. Hold your breath for 3-5 seconds or for as long as possible. Allow the piston or ball to fall to the bottom of the column. 7. Remove the mouthpiece from your mouth and breathe out normally. 8. Rest for a few seconds and  repeat Steps 1 through 7 at least 10 times every 1-2 hours when you are awake. Take your time and take a few normal breaths between deep breaths. 9. The spirometer may include an indicator to show your best effort. Use the indicator as a goal to work toward during each repetition. 10. After each set of 10 deep breaths, practice coughing to be sure your lungs are clear. If you have an incision (the cut made at the time of surgery), support your incision when coughing by placing a pillow or rolled up towels firmly against it. Once you are able to get out of bed, walk around indoors and cough well. You may stop using the incentive spirometer when instructed by your caregiver.  RISKS AND COMPLICATIONS  Take your time so you do not get dizzy or light-headed.  If you are in pain, you may need to take or ask for pain medication before doing incentive spirometry. It is harder to take a deep breath if you are having pain. AFTER USE  Rest and breathe slowly and easily.  It can be helpful to keep track of a log of your progress. Your caregiver can provide you with a simple table to help with this. If you are using the spirometer at home, follow these instructions: [place] IF:   You are having difficultly using the spirometer.  You have trouble using  the spirometer as often as instructed.  Your pain medication is not giving enough relief while using the spirometer.  You develop fever of 100.5 F (38.1 C) or higher. SEEK IMMEDIATE MEDICAL CARE IF:   You cough up bloody sputum that had not been present before.  You develop fever of 102 F (38.9 C) or greater.  You develop worsening pain at or near the incision site. MAKE SURE YOU:   Understand these instructions.  Will watch your condition.  Will get help right away if you are not doing well or get worse. Document Released: 04/20/2007 Document Revised: 03/01/2012 Document Reviewed: 06/21/2007 ExitCare Patient Information 2014 ExitCare, [place].   ________________________________________________________________________  WHAT IS A BLOOD TRANSFUSION? Blood Transfusion Information  A transfusion is the replacement of blood or some of its parts. Blood is made up of multiple cells which provide different functions.  Red blood cells carry oxygen and are used for blood loss replacement.  White blood cells fight against infection.  Platelets control bleeding.  Plasma helps clot blood.  Other blood products are available for specialized needs, such as hemophilia or other clotting disorders. BEFORE THE TRANSFUSION  Who gives blood for transfusions?   Healthy volunteers who are fully evaluated to make sure their blood is safe. This is blood bank blood. Transfusion therapy is the safest it has ever been in the practice of medicine. Before blood is taken from a donor, a complete history is taken to make sure that person has no history of diseases nor engages in risky social behavior (examples are intravenous drug use or sexual activity with multiple partners). The donor's travel history is screened to minimize risk of transmitting infections, such as malaria. The donated blood is tested for signs of infectious diseases, such as HIV and hepatitis. The blood is then tested to be sure it  is compatible with you in order to minimize the chance of a transfusion reaction. If you or a relative donates blood, this is often done in anticipation of surgery and is not appropriate for emergency situations. It takes many days to process the donated  blood. RISKS AND COMPLICATIONS Although transfusion therapy is very safe and saves many lives, the main dangers of transfusion include:   Getting an infectious disease.  Developing a transfusion reaction. This is an allergic reaction to something in the blood you were given. Every precaution is taken to prevent this. The decision to have a blood transfusion has been considered carefully by your caregiver before blood is given. Blood is not given unless the benefits outweigh the risks. AFTER THE TRANSFUSION  Right after receiving a blood transfusion, you will usually feel much better and more energetic. This is especially true if your red blood cells have gotten low (anemic). The transfusion raises the level of the red blood cells which carry oxygen, and this usually causes an energy increase.  The nurse administering the transfusion will monitor you carefully for complications. HOME CARE INSTRUCTIONS  No special instructions are needed after a transfusion. You may find your energy is better. Speak with your caregiver about any limitations on activity for underlying diseases you may have. SEEK MEDICAL CARE IF:   Your condition is not improving after your transfusion.  You develop redness or irritation at the intravenous (IV) site. SEEK IMMEDIATE MEDICAL CARE IF:  Any of the following symptoms occur over the next 12 hours:  Shaking chills.  You have a temperature by mouth above 102 F (38.9 C), not controlled by medicine.  Chest, back, or muscle pain.  People around you feel you are not acting correctly or are confused.  Shortness of breath or difficulty breathing.  Dizziness and fainting.  You get a rash or develop hives.  You  have a decrease in urine output.  Your urine turns a dark color or changes to pink, red, or brown. Any of the following symptoms occur over the next 10 days:  You have a temperature by mouth above 102 F (38.9 C), not controlled by medicine.  Shortness of breath.  Weakness after normal activity.  The white part of the eye turns yellow (jaundice).  You have a decrease in the amount of urine or are urinating less often.  Your urine turns a dark color or changes to pink, red, or brown. Document Released: 12/05/2000 Document Revised: 03/01/2012 Document Reviewed: 07/24/2008 [facility] Patient Information 2014 [place], [place].  _______________________________________________________________________

## 2019-10-08 ENCOUNTER — Other Ambulatory Visit (HOSPITAL_COMMUNITY)
Admission: RE | Admit: 2019-10-08 | Discharge: 2019-10-08 | Disposition: A | Discharge status: Home / Self Care | Payer: Medicare Other | Source: Ambulatory Visit | Attending: Orthopedic Surgery | Admitting: Orthopedic Surgery

## 2019-10-08 DIAGNOSIS — Z20828 Contact with and (suspected) exposure to other viral communicable diseases: Secondary | ICD-10-CM | POA: Diagnosis not present

## 2019-10-08 DIAGNOSIS — Z01812 Encounter for preprocedural laboratory examination: Secondary | ICD-10-CM | POA: Insufficient documentation

## 2019-10-09 LAB — NOVEL CORONAVIRUS, NAA (HOSP ORDER, SEND-OUT TO REF LAB; TAT 18-24 HRS): SARS-CoV-2, NAA: NOT DETECTED

## 2019-10-10 ENCOUNTER — Other Ambulatory Visit: Payer: Self-pay

## 2019-10-10 ENCOUNTER — Encounter (HOSPITAL_COMMUNITY)
Admission: RE | Admit: 2019-10-10 | Discharge: 2019-10-10 | Disposition: A | Discharge status: Home / Self Care | Payer: Medicare Other | Source: Ambulatory Visit | Attending: Orthopedic Surgery | Admitting: Orthopedic Surgery

## 2019-10-10 ENCOUNTER — Encounter (HOSPITAL_COMMUNITY): Payer: Self-pay

## 2019-10-10 DIAGNOSIS — Z01818 Encounter for other preprocedural examination: Secondary | ICD-10-CM | POA: Insufficient documentation

## 2019-10-10 HISTORY — DX: Personal history of other medical treatment: Z92.89

## 2019-10-10 HISTORY — DX: Personal history of colon polyps, unspecified: Z86.0100

## 2019-10-10 HISTORY — DX: Palpitations: R00.2

## 2019-10-10 HISTORY — DX: Personal history of colonic polyps: Z86.010

## 2019-10-10 HISTORY — DX: Pneumonia, unspecified organism: J18.9

## 2019-10-10 LAB — COMPREHENSIVE METABOLIC PANEL
ALT: 13 U/L (ref 0–44)
AST: 18 U/L (ref 15–41)
Albumin: 4.1 g/dL (ref 3.5–5.0)
Alkaline Phosphatase: 72 U/L (ref 38–126)
Anion gap: 7 (ref 5–15)
BUN: 13 mg/dL (ref 6–20)
CO2: 26 mmol/L (ref 22–32)
Calcium: 9.4 mg/dL (ref 8.9–10.3)
Chloride: 105 mmol/L (ref 98–111)
Creatinine, Ser: 0.89 mg/dL (ref 0.44–1.00)
GFR calc Af Amer: >60 mL/min (ref >60)
GFR calc non Af Amer: >60 mL/min (ref >60)
Glucose, Bld: 95 mg/dL (ref 70–99)
Potassium: 4.3 mmol/L (ref 3.5–5.1)
Sodium: 138 mmol/L (ref 135–145)
Total Bilirubin: 0.5 mg/dL (ref 0.3–1.2)
Total Protein: 7.5 g/dL (ref 6.5–8.1)

## 2019-10-10 LAB — CBC WITH DIFFERENTIAL/PLATELET
Abs Immature Granulocytes: 0.01 10*3/uL (ref 0.00–0.07)
Basophils Absolute: 0 10*3/uL (ref 0.0–0.1)
Basophils Relative: 1 %
Eosinophils Absolute: 0.3 10*3/uL (ref 0.0–0.5)
Eosinophils Relative: 5 %
HCT: 33.8 % — ABNORMAL LOW (ref 36.0–46.0)
Hemoglobin: 10.5 g/dL — ABNORMAL LOW (ref 12.0–15.0)
Immature Granulocytes: 0 %
Lymphocytes Relative: 34 %
Lymphs Abs: 1.7 10*3/uL (ref 0.7–4.0)
MCH: 27.9 pg (ref 26.0–34.0)
MCHC: 31.1 g/dL (ref 30.0–36.0)
MCV: 89.9 fL (ref 80.0–100.0)
Monocytes Absolute: 0.4 10*3/uL (ref 0.1–1.0)
Monocytes Relative: 8 %
Neutro Abs: 2.6 10*3/uL (ref 1.7–7.7)
Neutrophils Relative %: 52 %
Platelets: 220 10*3/uL (ref 150–400)
RBC: 3.76 MIL/uL — ABNORMAL LOW (ref 3.87–5.11)
RDW: 18.3 % — ABNORMAL HIGH (ref 11.5–15.5)
WBC: 5 10*3/uL (ref 4.0–10.5)
nRBC: 0 % (ref 0.0–0.2)

## 2019-10-10 LAB — PROTIME-INR
INR: 0.9 (ref 0.8–1.2)
Prothrombin Time: 12.4 seconds (ref 11.4–15.2)

## 2019-10-10 LAB — APTT: aPTT: 28 seconds (ref 24–36)

## 2019-10-10 NOTE — Progress Notes (Addendum)
PCP - Fredrich Romans P.A. [facility]. Cardiologist - N/A  Chest x-ray - 11/23/2018 in epic EKG - 10/10/2019 in epic Stress Test - N/A ECHO - N/A Cardiac Cath - N/A  Sleep Study - N/A CPAP - N/A  Fasting Blood Sugar - N/A Checks Blood Sugar N/A_____ times a day  Blood Thinner Instructions:N/A Aspirin Instructions:N/A Last Dose:N/A  Anesthesia review: Elevated blood pressure at pre op 183/99 before appointment 164/103 left arm and 169/97 in right arm at end of appointment. Patient stopped taking Atenolol because she felt like it was contributing to her fluid retention and causing her blood pressure to increase. Patient had a history of heart palpitations was place on a Holter monitor but stated she never returned back to have results of the monitor.  Patient denies shortness of breath, fever, cough and chest pain at PAT appointment   Patient verbalized understanding of instructions that were given to them at the PAT appointment. Patient was also instructed that they will need to review over the PAT instructions again at home before surgery.

## 2019-10-10 NOTE — Progress Notes (Signed)
SPOKE W/  Josefita     SCREENING SYMPTOMS OF COVID 19:   COUGH--NO  RUNNY NOSE--- NO  SORE THROAT---NO  NASAL CONGESTION----NO  SNEEZING----NO  SHORTNESS OF BREATH---NO  DIFFICULTY BREATHING---NO  TEMP >100.0 -----NO  UNEXPLAINED BODY ACHES------NO  CHILLS -------- NO  HEADACHES ---------NO  LOSS OF SMELL/ TASTE --------NO    HAVE YOU OR ANY FAMILY MEMBER TRAVELLED PAST 14 DAYS OUT OF THE   COUNTY---NO STATE----NO COUNTRY----NO  HAVE YOU OR ANY FAMILY MEMBER BEEN EXPOSED TO ANYONE WITH COVID 19? NO

## 2019-10-11 ENCOUNTER — Encounter (HOSPITAL_COMMUNITY): Payer: Self-pay | Admitting: Anesthesiology

## 2019-10-11 NOTE — Anesthesia Preprocedure Evaluation (Addendum)
Anesthesia Evaluation  Patient identified by MRN, date of birth, ID band Patient awake    Reviewed: Allergy & Precautions, NPO status , Patient's Chart, lab work & pertinent test results  Airway Mallampati: II       Dental no notable dental hx. (+) Teeth Intact   Pulmonary Current Smoker and Patient abstained from smoking.,    Pulmonary exam normal breath sounds clear to auscultation       Cardiovascular hypertension, Pt. on home beta blockers Normal cardiovascular exam Rhythm:Regular Rate:Normal     Neuro/Psych    GI/Hepatic Neg liver ROS,   Endo/Other    Renal/GU negative Renal ROS     Musculoskeletal  (+) Arthritis , Osteoarthritis,    Abdominal (+) + obese,   Peds  Hematology  (+) Blood dyscrasia, anemia ,   Anesthesia Other Findings   Reproductive/Obstetrics                            Anesthesia Physical Anesthesia Plan  ASA: II  Anesthesia Plan: General   Post-op Pain Management:  Regional for Post-op pain   Induction: Intravenous  PONV Risk Score and Plan:   Airway Management Planned: Oral ETT  Additional Equipment: None  Intra-op Plan:   Post-operative Plan: Extubation in OR  Informed Consent: I have reviewed the patients History and Physical, chart, labs and discussed the procedure including the risks, benefits and alternatives for the proposed anesthesia with the patient or authorized representative who has indicated his/her understanding and acceptance.     Dental advisory given  Plan Discussed with: CRNA  Anesthesia Plan Comments:        Anesthesia Quick Evaluation

## 2019-10-11 NOTE — Progress Notes (Signed)
Anesthesia Chart Review   Case: [id number] Date/Time: 10/12/19 0815   Procedure: Right shoulder rotator cuff repair with graft and anchors (Right Shoulder) - 34min   Anesthesia type: General   Pre-op diagnosis: Right shoulder rotator cuff tear   Location[facility] ROOM 01 / [facility]   Surgeon: Latanya Maudlin, MD      DISCUSSION:53 y.o. current every day smoker (4 pack years) with h/o HTN, hypothyroidism, right shoulder rotator cuff tear scheduled for above procedure 10/12/2019 with Dr. Latanya Maudlin.   Pt with poorly controlled BP.  She was seen by Dr. Melanee Left Nwobu 09/28/2019.  At this visit Atenolol 25 mg started, pt had been unable to tolerate Amlodipine.    Elevated BP at PAT visit.  Pt reported she did not take her blood pressure medication.  Discussed with patient importance of taking prescribed HTN medications.  Will assess DOS.  She is aware if BP is elevated case may be cancelled.  Pt states she has a follow up with PCP after surgery.   VS: BP (!) 164/103   Pulse 71   Temp 36.8 C (Oral)   Resp 18   Ht 5\' 6"  (1.676 m)   Wt 108.9 kg   LMP 06/07/2016   SpO2 100%   BMI 38.74 kg/m   PROVIDERS: Madalyn Rob, MD is PCP    LABS: Labs reviewed: Acceptable for surgery. (all labs ordered are listed, but only abnormal results are displayed)  Labs Reviewed  CBC WITH DIFFERENTIAL/PLATELET - Abnormal; Notable for the following components:      Result Value   RBC 3.76 (*)    Hemoglobin 10.5 (*)    HCT 33.8 (*)    RDW 18.3 (*)    All other components within normal limits  APTT  COMPREHENSIVE METABOLIC PANEL  PROTIME-INR  TYPE AND SCREEN     IMAGES:   EKG: 10/10/2019 Rate 66 bpm Normal sinus rhythm Minimal voltage criteria for LVH, may be normal variant Cannot rule out Anterior infarct , age undetermined Abnormal ECG No significant change since last tracing  CV:  Past Medical History:  Diagnosis Date  . Anemia   . Fibroids   . Heart palpitations    History of   . History of blood transfusion    with knee replacement  . History of colon polyps   . Hypertension   . Hypertension    no current meds for htn  . Knee joint injury   . Pneumonia    remote history  . Thyroid goiter    bilateral goiter and cyst follow up every 2 years Dr. Erik Obey    Past Surgical History:  Procedure Laterality Date  . ANKLE SURGERY Right 2009   broken ankle   . arthroscopic surgery to knee Bilateral   . CESAREAN SECTION    . COLONOSCOPY    . HAND SURGERY Bilateral   . JOINT REPLACEMENT Bilateral    knees  . REPLACEMENT TOTAL KNEE Right 2012  . TOTAL KNEE ARTHROPLASTY Left 01/03/2014   Procedure: LEFT TOTAL KNEE ARTHROPLASTY;  Surgeon: Tobi Bastos, MD;  Location: [facility];  Service: Orthopedics;  Laterality: Left;  . TUBAL LIGATION      MEDICATIONS: . atenolol (TENORMIN) 25 MG tablet  . atenolol (TENORMIN) 50 MG tablet  . azithromycin (ZITHROMAX) 250 MG tablet  . baclofen (LIORESAL) 10 MG tablet  . celecoxib (CELEBREX) 200 MG capsule  . erythromycin with ethanol (EMGEL) 2 % gel  . fluticasone (FLONASE) 50 MCG/ACT nasal spray  .  lidocaine (XYLOCAINE) 2 % solution  . methocarbamol (ROBAXIN) 500 MG tablet  . ondansetron (ZOFRAN) 4 MG tablet  . oxyCODONE-acetaminophen (PERCOCET) 5-325 MG tablet  . predniSONE (DELTASONE) 50 MG tablet  . VOLTAREN 1 % GEL   No current facility-administered medications for this encounter.      Maia Plan [facility] Pre-Surgical Testing [phone] 10/11/19  12:39 PM

## 2019-10-12 ENCOUNTER — Observation Stay (HOSPITAL_COMMUNITY)
Admission: RE | Admit: 2019-10-12 | Discharge: 2019-10-13 | Disposition: A | Discharge status: Home / Self Care | Payer: Medicare Other | Source: Ambulatory Visit | Attending: Orthopedic Surgery | Admitting: Orthopedic Surgery

## 2019-10-12 ENCOUNTER — Ambulatory Visit (HOSPITAL_COMMUNITY): Payer: Medicare Other | Admitting: Anesthesiology

## 2019-10-12 ENCOUNTER — Other Ambulatory Visit: Payer: Self-pay

## 2019-10-12 ENCOUNTER — Encounter (HOSPITAL_COMMUNITY)
Admission: RE | Disposition: A | Discharge status: Home / Self Care | Payer: Self-pay | Source: Ambulatory Visit | Attending: Orthopedic Surgery

## 2019-10-12 ENCOUNTER — Ambulatory Visit (HOSPITAL_COMMUNITY): Payer: Medicare Other | Admitting: Physician Assistant

## 2019-10-12 ENCOUNTER — Encounter (HOSPITAL_COMMUNITY): Payer: Self-pay | Admitting: *Deleted

## 2019-10-12 DIAGNOSIS — G8918 Other acute postprocedural pain: Secondary | ICD-10-CM | POA: Diagnosis not present

## 2019-10-12 DIAGNOSIS — I10 Essential (primary) hypertension: Secondary | ICD-10-CM | POA: Insufficient documentation

## 2019-10-12 DIAGNOSIS — Z96653 Presence of artificial knee joint, bilateral: Secondary | ICD-10-CM | POA: Diagnosis not present

## 2019-10-12 DIAGNOSIS — M75101 Unspecified rotator cuff tear or rupture of right shoulder, not specified as traumatic: Secondary | ICD-10-CM | POA: Diagnosis present

## 2019-10-12 DIAGNOSIS — F17210 Nicotine dependence, cigarettes, uncomplicated: Secondary | ICD-10-CM | POA: Insufficient documentation

## 2019-10-12 DIAGNOSIS — M7541 Impingement syndrome of right shoulder: Secondary | ICD-10-CM | POA: Insufficient documentation

## 2019-10-12 DIAGNOSIS — M75121 Complete rotator cuff tear or rupture of right shoulder, not specified as traumatic: Secondary | ICD-10-CM | POA: Diagnosis not present

## 2019-10-12 HISTORY — PX: SHOULDER OPEN ROTATOR CUFF REPAIR: SHX2407

## 2019-10-12 LAB — TYPE AND SCREEN
ABO/RH(D): AB POS
Antibody Screen: NEGATIVE

## 2019-10-12 SURGERY — REPAIR, ROTATOR CUFF, OPEN
Anesthesia: General | Site: Shoulder | Laterality: Right

## 2019-10-12 MED ORDER — KETOROLAC TROMETHAMINE 30 MG/ML IJ SOLN: 30.0000 mg | Freq: Once | INTRAMUSCULAR | Status: DC | PRN

## 2019-10-12 MED ORDER — ONDANSETRON HCL 4 MG/2ML IJ SOLN: 4.0000 mg | Freq: Four times a day (QID) | INTRAMUSCULAR | Status: DC | PRN

## 2019-10-12 MED ORDER — FENTANYL CITRATE (PF) 100 MCG/2ML IJ SOLN: 25.0000 ug | INTRAMUSCULAR | Status: DC | PRN

## 2019-10-12 MED ORDER — ACETAMINOPHEN 10 MG/ML IV SOLN
INTRAVENOUS | Status: AC
  Filled 2019-10-12: qty 100

## 2019-10-12 MED ORDER — METHOCARBAMOL 500 MG PO TABS: 500.0000 mg | ORAL_TABLET | Freq: Four times a day (QID) | ORAL | Status: DC | PRN

## 2019-10-12 MED ORDER — FENTANYL CITRATE (PF) 100 MCG/2ML IJ SOLN
INTRAMUSCULAR | Status: AC
  Filled 2019-10-12: qty 2

## 2019-10-12 MED ORDER — ONDANSETRON HCL 4 MG PO TABS: 4.0000 mg | ORAL_TABLET | Freq: Four times a day (QID) | ORAL | Status: DC | PRN

## 2019-10-12 MED ORDER — DEXAMETHASONE SODIUM PHOSPHATE 10 MG/ML IJ SOLN
INTRAMUSCULAR | Status: DC | PRN
  Administered 2019-10-12: 10 mg via INTRAVENOUS

## 2019-10-12 MED ORDER — ACETAMINOPHEN 500 MG PO TABS
1000.0000 mg | ORAL_TABLET | Freq: Four times a day (QID) | ORAL | Status: AC
  Administered 2019-10-12 – 2019-10-13 (×4): 1000 mg via ORAL
  Filled 2019-10-12 (×4): qty 2

## 2019-10-12 MED ORDER — ATENOLOL 25 MG PO TABS
25.0000 mg | ORAL_TABLET | Freq: Every day | ORAL | Status: DC
  Filled 2019-10-12: qty 1

## 2019-10-12 MED ORDER — FLUTICASONE PROPIONATE 50 MCG/ACT NA SUSP
1.0000 | Freq: Every day | NASAL | Status: DC
  Filled 2019-10-12: qty 16

## 2019-10-12 MED ORDER — THROMBIN (RECOMBINANT) 5000 UNITS EX SOLR
CUTANEOUS | Status: AC
  Filled 2019-10-12: qty 5000

## 2019-10-12 MED ORDER — LACTATED RINGERS IV SOLN
INTRAVENOUS | Status: DC
  Administered 2019-10-12: 1000 mL via INTRAVENOUS

## 2019-10-12 MED ORDER — CEFAZOLIN SODIUM-DEXTROSE 2-4 GM/100ML-% IV SOLN
2.0000 g | Freq: Four times a day (QID) | INTRAVENOUS | Status: AC
  Administered 2019-10-12 (×2): 2 g via INTRAVENOUS
  Filled 2019-10-12 (×2): qty 100

## 2019-10-12 MED ORDER — CLONIDINE HCL (ANALGESIA) 100 MCG/ML EP SOLN: EPIDURAL | Status: DC | PRN

## 2019-10-12 MED ORDER — METHOCARBAMOL 500 MG IVPB - SIMPLE MED
500.0000 mg | Freq: Four times a day (QID) | INTRAVENOUS | Status: DC | PRN
  Filled 2019-10-12: qty 50

## 2019-10-12 MED ORDER — CEFAZOLIN SODIUM-DEXTROSE 2-4 GM/100ML-% IV SOLN
2.0000 g | INTRAVENOUS | Status: AC
  Administered 2019-10-12: 2 g via INTRAVENOUS
  Filled 2019-10-12: qty 100

## 2019-10-12 MED ORDER — ROPIVACAINE HCL 7.5 MG/ML IJ SOLN
INTRAMUSCULAR | Status: DC | PRN
  Administered 2019-10-12 (×4): 5 mL via PERINEURAL

## 2019-10-12 MED ORDER — METHOCARBAMOL 500 MG IVPB - SIMPLE MED
INTRAVENOUS | Status: AC
  Filled 2019-10-12: qty 50

## 2019-10-12 MED ORDER — POLYETHYLENE GLYCOL 3350 17 G PO PACK: 17.0000 g | PACK | Freq: Every day | ORAL | Status: DC | PRN

## 2019-10-12 MED ORDER — CELECOXIB 200 MG PO CAPS
200.0000 mg | ORAL_CAPSULE | Freq: Two times a day (BID) | ORAL | Status: DC
  Administered 2019-10-12 – 2019-10-13 (×2): 200 mg via ORAL
  Filled 2019-10-12 (×2): qty 1

## 2019-10-12 MED ORDER — PROPOFOL 10 MG/ML IV BOLUS
INTRAVENOUS | Status: DC | PRN
  Administered 2019-10-12: 200 mg via INTRAVENOUS

## 2019-10-12 MED ORDER — LIDOCAINE 2% (20 MG/ML) 5 ML SYRINGE
INTRAMUSCULAR | Status: DC | PRN
  Administered 2019-10-12: 50 mg via INTRAVENOUS

## 2019-10-12 MED ORDER — DOCUSATE SODIUM 100 MG PO CAPS
100.0000 mg | ORAL_CAPSULE | Freq: Two times a day (BID) | ORAL | Status: DC
  Administered 2019-10-12 – 2019-10-13 (×2): 100 mg via ORAL
  Filled 2019-10-12 (×2): qty 1

## 2019-10-12 MED ORDER — METHOCARBAMOL 500 MG PO TABS
500.0000 mg | ORAL_TABLET | Freq: Three times a day (TID) | ORAL | Status: DC
  Administered 2019-10-12 – 2019-10-13 (×3): 500 mg via ORAL
  Filled 2019-10-12 (×3): qty 1

## 2019-10-12 MED ORDER — METOCLOPRAMIDE HCL 5 MG PO TABS: 5.0000 mg | ORAL_TABLET | Freq: Three times a day (TID) | ORAL | Status: DC | PRN

## 2019-10-12 MED ORDER — SODIUM CHLORIDE 0.9 % IV SOLN
INTRAVENOUS | Status: DC | PRN
  Administered 2019-10-12: 500 mL

## 2019-10-12 MED ORDER — OXYCODONE HCL 5 MG PO TABS
10.0000 mg | ORAL_TABLET | ORAL | Status: DC | PRN
  Administered 2019-10-12 – 2019-10-13 (×3): 10 mg via ORAL
  Administered 2019-10-13 (×2): 15 mg via ORAL
  Filled 2019-10-12 (×2): qty 3
  Filled 2019-10-12: qty 2

## 2019-10-12 MED ORDER — FLEET ENEMA 7-19 GM/118ML RE ENEM: 1.0000 | ENEMA | Freq: Once | RECTAL | Status: DC | PRN

## 2019-10-12 MED ORDER — ONDANSETRON HCL 4 MG/2ML IJ SOLN: 4.0000 mg | Freq: Once | INTRAMUSCULAR | Status: DC | PRN

## 2019-10-12 MED ORDER — ONDANSETRON HCL 4 MG/2ML IJ SOLN
INTRAMUSCULAR | Status: DC | PRN
  Administered 2019-10-12: 4 mg via INTRAVENOUS

## 2019-10-12 MED ORDER — LACTATED RINGERS IV SOLN
INTRAVENOUS | Status: DC
  Administered 2019-10-12: 07:00:00 via INTRAVENOUS

## 2019-10-12 MED ORDER — CLONIDINE HCL (ANALGESIA) 100 MCG/ML EP SOLN
EPIDURAL | Status: DC | PRN
  Administered 2019-10-12: 100 ug

## 2019-10-12 MED ORDER — SUGAMMADEX SODIUM 200 MG/2ML IV SOLN
INTRAVENOUS | Status: DC | PRN
  Administered 2019-10-12: 400 mg via INTRAVENOUS

## 2019-10-12 MED ORDER — SODIUM CHLORIDE 0.9 % IV SOLN
INTRAVENOUS | Status: DC | PRN
  Administered 2019-10-12: 10 ug/min via INTRAVENOUS

## 2019-10-12 MED ORDER — ACETAMINOPHEN 325 MG PO TABS
325.0000 mg | ORAL_TABLET | Freq: Four times a day (QID) | ORAL | Status: DC | PRN
  Administered 2019-10-13 (×2): 650 mg via ORAL
  Filled 2019-10-12 (×2): qty 2

## 2019-10-12 MED ORDER — PHENOL 1.4 % MT LIQD: 1.0000 | OROMUCOSAL | Status: DC | PRN

## 2019-10-12 MED ORDER — METOCLOPRAMIDE HCL 5 MG/ML IJ SOLN: 5.0000 mg | Freq: Three times a day (TID) | INTRAMUSCULAR | Status: DC | PRN

## 2019-10-12 MED ORDER — PROPOFOL 10 MG/ML IV BOLUS
INTRAVENOUS | Status: AC
  Filled 2019-10-12: qty 40

## 2019-10-12 MED ORDER — BUPIVACAINE LIPOSOME 1.3 % IJ SUSP
20.0000 mL | Freq: Once | INTRAMUSCULAR | Status: DC
  Filled 2019-10-12: qty 20

## 2019-10-12 MED ORDER — VANCOMYCIN HCL IN DEXTROSE 1-5 GM/200ML-% IV SOLN: 1000.0000 mg | Freq: Two times a day (BID) | INTRAVENOUS | Status: DC

## 2019-10-12 MED ORDER — BISACODYL 5 MG PO TBEC: 5.0000 mg | DELAYED_RELEASE_TABLET | Freq: Every day | ORAL | Status: DC | PRN

## 2019-10-12 MED ORDER — MEPERIDINE HCL 50 MG/ML IJ SOLN: 6.2500 mg | INTRAMUSCULAR | Status: DC | PRN

## 2019-10-12 MED ORDER — PHENYLEPHRINE HCL (PRESSORS) 10 MG/ML IV SOLN
INTRAVENOUS | Status: AC
  Filled 2019-10-12: qty 1

## 2019-10-12 MED ORDER — MIDAZOLAM HCL 2 MG/2ML IJ SOLN
INTRAMUSCULAR | Status: AC
  Filled 2019-10-12: qty 2

## 2019-10-12 MED ORDER — OXYCODONE HCL 5 MG PO TABS
5.0000 mg | ORAL_TABLET | ORAL | Status: DC | PRN
  Filled 2019-10-12 (×3): qty 2

## 2019-10-12 MED ORDER — ROCURONIUM BROMIDE 10 MG/ML (PF) SYRINGE
PREFILLED_SYRINGE | INTRAVENOUS | Status: DC | PRN
  Administered 2019-10-12: 60 mg via INTRAVENOUS

## 2019-10-12 MED ORDER — HYDROMORPHONE HCL 1 MG/ML IJ SOLN
0.5000 mg | INTRAMUSCULAR | Status: DC | PRN
  Administered 2019-10-13: 1 mg via INTRAVENOUS
  Filled 2019-10-12: qty 1

## 2019-10-12 MED ORDER — ONDANSETRON HCL 4 MG PO TABS: 4.0000 mg | ORAL_TABLET | Freq: Three times a day (TID) | ORAL | Status: DC | PRN

## 2019-10-12 MED ORDER — FENTANYL CITRATE (PF) 100 MCG/2ML IJ SOLN
INTRAMUSCULAR | Status: DC | PRN
  Administered 2019-10-12: 50 ug via INTRAVENOUS
  Administered 2019-10-12: 100 ug via INTRAVENOUS

## 2019-10-12 MED ORDER — MIDAZOLAM HCL 5 MG/5ML IJ SOLN
INTRAMUSCULAR | Status: DC | PRN
  Administered 2019-10-12: 2 mg via INTRAVENOUS

## 2019-10-12 MED ORDER — MENTHOL 3 MG MT LOZG: 1.0000 | LOZENGE | OROMUCOSAL | Status: DC | PRN

## 2019-10-12 MED ORDER — CHLORHEXIDINE GLUCONATE 4 % EX LIQD: 60.0000 mL | Freq: Once | CUTANEOUS | Status: DC

## 2019-10-12 MED ORDER — SODIUM CHLORIDE 0.9 % IV SOLN
INTRAVENOUS | Status: AC
  Filled 2019-10-12: qty 500000

## 2019-10-12 SURGICAL SUPPLY — 51 items
ANCHOR PEEK ZIP 5.5 NDL NO2 (Orthopedic Implant) ×2 IMPLANT
ATTRACTOMAT 16X20 MAGNETIC DRP (DRAPES) ×2 IMPLANT
BAG ZIPLOCK 12X15 (MISCELLANEOUS) IMPLANT
BENZOIN TINCTURE PRP APPL 2/3 (GAUZE/BANDAGES/DRESSINGS) ×1 IMPLANT
BLADE OSCILLATING/SAGITTAL (BLADE) ×1
BLADE SW THK.38XMED LNG THN (BLADE) ×1 IMPLANT
BUR OVAL CARBIDE 4.0 (BURR) ×2 IMPLANT
COVER SURGICAL LIGHT HANDLE (MISCELLANEOUS) ×2 IMPLANT
COVER WAND RF STERILE (DRAPES) IMPLANT
DERMASPAN .5-.9MM 4X4CM SHOU (Miscellaneous) ×1 IMPLANT
DRAPE POUCH INSTRU U-SHP 10X18 (DRAPES) ×2 IMPLANT
DRESSING AQUACEL AG SP 3.5X6 (GAUZE/BANDAGES/DRESSINGS) IMPLANT
DRSG AQUACEL AG ADV 3.5X 6 (GAUZE/BANDAGES/DRESSINGS) ×2 IMPLANT
DRSG AQUACEL AG SP 3.5X6 (GAUZE/BANDAGES/DRESSINGS) ×2
DURAPREP 26ML APPLICATOR (WOUND CARE) ×2 IMPLANT
ELECT REM PT RETURN 15FT ADLT (MISCELLANEOUS) ×2 IMPLANT
GLOVE BIOGEL PI IND STRL 6.5 (GLOVE) ×1 IMPLANT
GLOVE BIOGEL PI IND STRL 8.5 (GLOVE) ×1 IMPLANT
GLOVE BIOGEL PI INDICATOR 6.5 (GLOVE) ×1
GLOVE BIOGEL PI INDICATOR 8.5 (GLOVE) ×1
GLOVE ECLIPSE 8.0 STRL XLNG CF (GLOVE) ×2 IMPLANT
GLOVE SURG SS PI 6.5 STRL IVOR (GLOVE) ×2 IMPLANT
GOWN STRL REUS W/TWL LRG LVL3 (GOWN DISPOSABLE) ×2 IMPLANT
GOWN STRL REUS W/TWL XL LVL3 (GOWN DISPOSABLE) ×2 IMPLANT
HEMOSTAT SPONGE AVITENE ULTRA (HEMOSTASIS) ×2 IMPLANT
KIT BASIN OR (CUSTOM PROCEDURE TRAY) ×2 IMPLANT
KIT POSITION SHOULDER SCHLEI (MISCELLANEOUS) ×2 IMPLANT
KIT TURNOVER KIT A (KITS) IMPLANT
MANIFOLD NEPTUNE II (INSTRUMENTS) ×2 IMPLANT
NDL MA TROC 1/2 (NEEDLE) IMPLANT
NEEDLE MA TROC 1/2 (NEEDLE) IMPLANT
NS IRRIG 1000ML POUR BTL (IV SOLUTION) IMPLANT
PACK SHOULDER (CUSTOM PROCEDURE TRAY) ×2 IMPLANT
PROTECTOR NERVE ULNAR (MISCELLANEOUS) ×2 IMPLANT
RESTRAINT HEAD UNIVERSAL NS (MISCELLANEOUS) IMPLANT
SLING ARM IMMOBILIZER LRG (SOFTGOODS) ×2 IMPLANT
SPONGE LAP 4X18 RFD (DISPOSABLE) IMPLANT
STAPLER VISISTAT 35W (STAPLE) IMPLANT
STRIP CLOSURE SKIN 1/2X4 (GAUZE/BANDAGES/DRESSINGS) ×2 IMPLANT
SUCTION FRAZIER HANDLE 12FR (TUBING) ×1
SUCTION TUBE FRAZIER 12FR DISP (TUBING) ×1 IMPLANT
SUT BONE WAX W31G (SUTURE) ×2 IMPLANT
SUT ETHIBOND NAB CT1 #1 30IN (SUTURE) ×3 IMPLANT
SUT MNCRL AB 4-0 PS2 18 (SUTURE) ×2 IMPLANT
SUT VIC AB 1 CT1 27 (SUTURE) ×1
SUT VIC AB 1 CT1 27XBRD ANTBC (SUTURE) ×1 IMPLANT
SUT VIC AB 2-0 CT1 27 (SUTURE) ×1
SUT VIC AB 2-0 CT1 TAPERPNT 27 (SUTURE) ×1 IMPLANT
TAPE STRIPS DRAPE STRL (GAUZE/BANDAGES/DRESSINGS) ×1 IMPLANT
TOWEL OR 17X26 10 PK STRL BLUE (TOWEL DISPOSABLE) ×2 IMPLANT
TOWEL OR NON WOVEN STRL DISP B (DISPOSABLE) ×2 IMPLANT

## 2019-10-12 NOTE — Progress Notes (Signed)
Assisted Dr. Hatchett with right, ultrasound guided, interscalene  block. Side rails up, monitors on throughout procedure. See vital signs in flow sheet. Tolerated Procedure well.  

## 2019-10-12 NOTE — Anesthesia Procedure Notes (Signed)
Procedure Name: Intubation Date/Time: 10/12/2019 9:05 AM Performed by: Lavina Hamman, CRNA Pre-anesthesia Checklist: Patient identified, Emergency Drugs available, Suction available, Patient being monitored and Timeout performed Patient Re-evaluated:Patient Re-evaluated prior to induction Oxygen Delivery Method: Circle system utilized Preoxygenation: Pre-oxygenation with 100% oxygen Induction Type: IV induction Ventilation: Mask ventilation without difficulty Laryngoscope Size: Mac and 3 Grade View: Grade I Tube type: Oral Tube size: 7.0 mm Number of attempts: 1 Airway Equipment and Method: Stylet Placement Confirmation: ETT inserted through vocal cords under direct vision,  positive ETCO2,  CO2 detector and breath sounds checked- equal and bilateral Secured at: 22 cm Tube secured with: Tape Dental Injury: Teeth and Oropharynx as per pre-operative assessment

## 2019-10-12 NOTE — Transfer of Care (Signed)
Immediate Anesthesia Transfer of Care Note  Patient: [name]  Procedure(s) Performed: Procedure(s) with comments: Right shoulder rotator cuff repair with graft and anchors (Right) - 8min  Patient Location: PACU  Anesthesia Type:General  Level of Consciousness:  sedated, patient cooperative and responds to stimulation  Airway & Oxygen Therapy:Patient Spontanous Breathing and Patient connected to face mask oxgen  Post-op Assessment:  Report given to PACU RN and Post -op Vital signs reviewed and stable  Post vital signs:  Reviewed and stable  Last Vitals:  Vitals:   10/12/19 0815 10/12/19 0816  BP:  127/84  Pulse: (!) 53 (!) 55  Resp: 16 15  Temp:    SpO2: 123456 0000000    Complications: No apparent anesthesia complications

## 2019-10-12 NOTE — Anesthesia Procedure Notes (Signed)
Anesthesia Regional Block: Interscalene brachial plexus block   Pre-Anesthetic Checklist: ,, timeout performed, Correct Patient, Correct Site, Correct Laterality, Correct Procedure, Correct Position, site marked, Risks and benefits discussed,  Surgical consent,  Pre-op evaluation,  At surgeon's request and post-op pain management  Laterality: Right and Upper  Prep: chloraprep       Needles:  Injection technique: Single-shot  Needle Type: Echogenic Stimulator Needle     Needle Length: 10cm  Needle Gauge: 21   Needle insertion depth: 1 cm   Additional Needles:   Procedures:,,,, ultrasound used (permanent image in chart),,,,  Narrative:  Start time: 10/12/2019 7:56 AM End time: 10/12/2019 8:04 AM Injection made incrementally with aspirations every 5 mL.  Performed by: Personally  Anesthesiologist: Lyn Hollingshead, MD

## 2019-10-12 NOTE — Anesthesia Postprocedure Evaluation (Signed)
Anesthesia Post Note  Patient: [name]  Procedure(s) Performed: Right shoulder rotator cuff repair with graft and anchors (Right Shoulder)     Patient location during evaluation: PACU Anesthesia Type: General Level of consciousness: sedated Pain management: pain level controlled Vital Signs Assessment: post-procedure vital signs reviewed and stable Respiratory status: spontaneous breathing Cardiovascular status: bradycardic and stable Postop Assessment: no apparent nausea or vomiting Anesthetic complications: no    Last Vitals:  Vitals:   10/12/19 1015 10/12/19 1030  BP: 138/83   Pulse: (!) 47 (!) 46  Resp: 16 (!) 23  Temp:    SpO2: 100% 100%    Last Pain:  Vitals:   10/12/19 1030  TempSrc:   PainSc: 0-No pain   Pain Goal:                   Huston Foley

## 2019-10-12 NOTE — Interval H&P Note (Signed)
History and Physical Interval Note:  10/12/2019 8:23 AM  [name]  has presented today for surgery, with the diagnosis of Right shoulder rotator cuff tear.  The various methods of treatment have been discussed with the patient and family. After consideration of risks, benefits and other options for treatment, the patient has consented to  Procedure(s) with comments: Right shoulder rotator cuff repair with graft and anchors (Right) - 79min as a surgical intervention.  The patient's history has been reviewed, patient examined, no change in status, stable for surgery.  I have reviewed the patient's chart and labs.  Questions were answered to the patient's satisfaction.     Latanya Maudlin

## 2019-10-12 NOTE — H&P (Signed)
[name] is an 53 y.o. female.   Chief Complaint: Pianful Motion in Right Shoilder HPI: Patient developed progressive painful loss of motion in her Right Shoulder.  Past Medical History:  Diagnosis Date  . Anemia   . Fibroids   . Heart palpitations    History of   . History of blood transfusion    with knee replacement  . History of colon polyps   . Hypertension   . Hypertension    no current meds for htn  . Knee joint injury   . Pneumonia    remote history  . Thyroid goiter    bilateral goiter and cyst follow up every 2 years Dr. Erik Obey    Past Surgical History:  Procedure Laterality Date  . ANKLE SURGERY Right 2009   broken ankle   . arthroscopic surgery to knee Bilateral   . CESAREAN SECTION    . COLONOSCOPY    . HAND SURGERY Bilateral   . JOINT REPLACEMENT Bilateral    knees  . REPLACEMENT TOTAL KNEE Right 2012  . TOTAL KNEE ARTHROPLASTY Left 01/03/2014   Procedure: LEFT TOTAL KNEE ARTHROPLASTY;  Surgeon: Tobi Bastos, MD;  Location: [facility];  Service: Orthopedics;  Laterality: Left;  . TUBAL LIGATION      Family History  Problem Relation Age of Onset  . Hypertension Mother   . Hypertension Father   . Cancer Maternal Grandmother        ? lung   Social History:  reports that she has been smoking cigarettes. She has a 4.00 pack-year smoking history. She has never used smokeless tobacco. She reports current alcohol use of about 2.0 standard drinks of alcohol per week. She reports that she does not use drugs.  Allergies:  Allergies  Allergen Reactions  . Tramadol Itching  . Ibuprofen Nausea And Vomiting  . Metronidazole Itching and Nausea And Vomiting  . Penicillins Itching    Denies hives, or airway issues.     Medications Prior to Admission  Medication Sig Dispense Refill  . atenolol (TENORMIN) 25 MG tablet Take 25 mg by mouth daily.    Marland Kitchen atenolol (TENORMIN) 50 MG tablet Take 1 tablet (50 mg total) by mouth daily. 30 tablet 1  . azithromycin  (ZITHROMAX) 250 MG tablet Take 1 tablet (250 mg total) by mouth daily. Take first 2 tablets together, then 1 every day until finished. (Patient not taking: Reported on 10/10/2019) 6 tablet 0  . baclofen (LIORESAL) 10 MG tablet Take 1 tablet (10 mg total) by mouth 3 (three) times daily. (Patient not taking: Reported on 02/21/2019) 30 each 0  . celecoxib (CELEBREX) 200 MG capsule Take 1 capsule (200 mg total) by mouth 2 (two) times daily. (Patient not taking: Reported on 02/21/2019) 20 capsule 0  . erythromycin with ethanol (EMGEL) 2 % gel Apply to affected area 2 times daily. (Patient not taking: Reported on 02/21/2019) 30 g 0  . fluticasone (FLONASE) 50 MCG/ACT nasal spray Place 1 spray into both nostrils daily. (Patient not taking: Reported on 02/21/2019) 16 g 0  . lidocaine (XYLOCAINE) 2 % solution Use as directed 15 mLs in the mouth or throat as needed for mouth pain. (Patient not taking: Reported on 10/10/2019) 15 mL 0  . methocarbamol (ROBAXIN) 500 MG tablet Take 1 tablet (500 mg total) by mouth every 8 (eight) hours as needed. (Patient not taking: Reported on 02/21/2019) 15 tablet 0  . ondansetron (ZOFRAN) 4 MG tablet Take 1 tablet (4 mg total)  by mouth every 8 (eight) hours as needed for nausea or vomiting. (Patient not taking: Reported on 02/21/2019) 12 tablet 0  . oxyCODONE-acetaminophen (PERCOCET) 5-325 MG tablet Take 1 tablet by mouth every 8 (eight) hours as needed for severe pain. (Patient not taking: Reported on 02/21/2019) 6 tablet 0  . predniSONE (DELTASONE) 50 MG tablet Take 1 tablet daily with breakfast (Patient not taking: Reported on 02/21/2019) 5 tablet 0  . VOLTAREN 1 % GEL APPLY 2GM TOPICALLY FOUR TIMES DAILY AS DIRECTED (Patient not taking: Reported on 02/21/2019) 100 g 0    Results for orders placed or performed during the hospital encounter of 10/10/19 (from the past 48 hour(s))  APTT     Status: None   Collection Time: 10/10/19  2:07 PM  Result Value Ref Range   aPTT 28 24 - 36 seconds     Comment: Performed at [facility], [place] [address], [place], [place] [address]  CBC WITH DIFFERENTIAL     Status: Abnormal   Collection Time: 10/10/19  2:07 PM  Result Value Ref Range   WBC 5.0 4.0 - 10.5 K/uL   RBC 3.76 (L) 3.87 - 5.11 MIL/uL   Hemoglobin 10.5 (L) 12.0 - 15.0 g/dL   HCT 33.8 (L) 36.0 - 46.0 %   MCV 89.9 80.0 - 100.0 fL   MCH 27.9 26.0 - 34.0 pg   MCHC 31.1 30.0 - 36.0 g/dL   RDW 18.3 (H) 11.5 - 15.5 %   Platelets 220 150 - 400 K/uL   nRBC 0.0 0.0 - 0.2 %   Neutrophils Relative % 52 %   Neutro Abs 2.6 1.7 - 7.7 K/uL   Lymphocytes Relative 34 %   Lymphs Abs 1.7 0.7 - 4.0 K/uL   Monocytes Relative 8 %   Monocytes Absolute 0.4 0.1 - 1.0 K/uL   Eosinophils Relative 5 %   Eosinophils Absolute 0.3 0.0 - 0.5 K/uL   Basophils Relative 1 %   Basophils Absolute 0.0 0.0 - 0.1 K/uL   Immature Granulocytes 0 %   Abs Immature Granulocytes 0.01 0.00 - 0.07 K/uL    Comment: Performed at [facility], [place] [address] [place], [place] [address]  Comprehensive metabolic panel     Status: None   Collection Time: 10/10/19  2:07 PM  Result Value Ref Range   Sodium 138 135 - 145 mmol/L   Potassium 4.3 3.5 - 5.1 mmol/L   Chloride 105 98 - 111 mmol/L   CO2 26 22 - 32 mmol/L   Glucose, Bld 95 70 - 99 mg/dL   BUN 13 6 - 20 mg/dL   Creatinine, Ser 0.89 0.44 - 1.00 mg/dL   Calcium 9.4 8.9 - 10.3 mg/dL   Total Protein 7.5 6.5 - 8.1 g/dL   Albumin 4.1 3.5 - 5.0 g/dL   AST 18 15 - 41 U/L   ALT 13 0 - 44 U/L   Alkaline Phosphatase 72 38 - 126 U/L   Total Bilirubin 0.5 0.3 - 1.2 mg/dL   GFR calc non Af Amer >60 >60 mL/min   GFR calc Af Amer >60 >60 mL/min   Anion gap 7 5 - 15    Comment: Performed at [facility], [place] [address], [place], [place] [address]  Protime-INR     Status: None   Collection Time: 10/10/19  2:07 PM  Result Value Ref Range   Prothrombin Time 12.4 11.4 - 15.2 seconds   INR 0.9 0.8 - 1.2  Comment:  (NOTE) INR goal varies based on device and disease states. Performed at [facility], [place] [address], [place], [place] [address]   Type and screen Order type and screen if day of surgery is less than 15 days from draw of preadmission visit or order morning of surgery if day of surgery is greater than 6 days from preadmission visit.     Status: None   Collection Time: 10/10/19  2:07 PM  Result Value Ref Range   ABO/RH(D) AB POS    Antibody Screen NEG    Sample Expiration 10/24/2019,2359    Extend sample reason      NO TRANSFUSIONS OR PREGNANCY IN THE PAST 3 MONTHS Performed at [facility], [place] [address], [place], [place] [address]    No results found.  Review of Systems  Constitutional: Negative.   HENT: Negative.   Eyes: Negative.   Respiratory: Negative.   Cardiovascular: Negative.   Gastrointestinal: Negative.   Genitourinary: Negative.   Musculoskeletal: Positive for joint pain.  Skin: Negative.   Neurological: Negative.   Endo/Heme/Allergies: Negative.   Psychiatric/Behavioral: Negative.     Blood pressure 127/84, pulse (!) 55, temperature 97.7 F (36.5 C), temperature source Oral, resp. rate 15, last menstrual period 06/07/2016, SpO2 97 %. Physical Exam  Constitutional: She appears well-developed.  HENT:  Head: Normocephalic.  Eyes: Pupils are equal, round, and reactive to light.  Neck: Normal range of motion.  Cardiovascular: Normal rate.  Respiratory: Effort normal.  GI: Soft.  Musculoskeletal:        General: Tenderness present.  Neurological: She is alert.  Skin: Skin is warm.  Psychiatric: She has a normal mood and affect.     Assessment/Plan Open Repair of torn Right Rotator Cuff and possible graft and possible Anchor and open Partial Acromionectomy.  Latanya Maudlin, MD 10/12/2019, 8:18 AM

## 2019-10-12 NOTE — Brief Op Note (Signed)
10/12/2019  9:43 AM  PATIENT:  [name]  53 y.o. female  PRE-OPERATIVE DIAGNOSIS:  Right shoulder rotator cuff tear,Complex and Retracted.  POST-OPERATIVE DIAGNOSIS:  Same as Pre-Op  PROCEDURE:  Procedure(s) with comments: Right shoulder rotator cuff repair with graft and anchors (Right) - 33min  SURGEON:  Surgeon(s) and Role:    Latanya Maudlin, MD - Primary  PHYSICIAN ASSISTANT:Amber St. Benedict PA   ASSISTANTS: Ardeen Jourdain PA  ANESTHESIA:   general and Interscalene Nerve Block ,right Shoulder.  EBL:  50 mL   BLOOD ADMINISTERED:none  DRAINS: none   LOCAL MEDICATIONS USED: Interscalene Nerve Block Right Shoulder by Anesthesia.    SPECIMEN:  No Specimen  DISPOSITION OF SPECIMEN:  N/A  COUNTS:  YES  TOURNIQUET:  * No tourniquets in log *  DICTATION: .Other Dictation: Dictation Number [id number]  PLAN OF CARE: Admit for overnight observation  PATIENT DISPOSITION:  Stable in OR   Delay start of Pharmacological VTE agent (>24hrs) due to surgical blood loss or risk of bleeding: yes

## 2019-10-12 NOTE — Op Note (Signed)
NAME: [name], HORTIN MEDICAL RECORD [id number] ACCOUNT [id number] DATE OF BIRTH:[birth year] FACILITY: [facility] LOCATION: [facility] PHYSICIAN:Shenna Brissette Fransico Setters, MD  OPERATIVE REPORT  DATE OF PROCEDURE:  10/12/2019  SURGEON:  Latanya Maudlin, MD  ASSISTANT:  Ardeen Jourdain, PA  PREOPERATIVE DIAGNOSES: 1.  Complete complex retracted tear of the rotator cuff on the right. 2.  Severe impingement syndrome, right shoulder.  POSTOPERATIVE DIAGNOSES: 1.  Complete complex retracted tear of the rotator cuff on the right. 2.  Severe impingement syndrome, right shoulder.  OPERATION: 1.  Open acromionectomy of right shoulder with acromioplasty. 2.  Repair of a complete complex retracted tear of the right rotator cuff utilizing a Dermaspan graft and 2 anchors.  DESCRIPTION OF PROCEDURE:  Under general anesthesia after an interscalene nerve block was given, the appropriate timeout was first carried out.  I also marked the appropriate right shoulder in the holding area.  The patient was placed in beach-chair  position.  She had 2 g of IV Ancef.  After sterile prep and draping, an incision was made over the anterior aspect of the left shoulder.  Bleeders identified and cauterized.  I then partially stripped the deltoid tendon from the acromion in both  directions.  I then split a small proximal part of the deltoid muscle.  At this time, the Bennett retractor was inserted up under the acromion.  She had severe impingement syndrome.  There was severe overgrowth and downsloping.  I then reestablished the  subacromial space utilizing the oscillating saw and a bur in the usual fashion.  I then burred the lateral articular surface of the humerus, which was exposed because of the retracted tendon.  After getting a good bone bleeding bed, I then grasped the  cuff and brought it entirely forward.  I utilized a gloved finger to free it up subacromial as well.  We were able to easily place it back in place.  Two  anchors were inserted.  After I did the repair, I then applied a Dermaspan graft utilizing the lower  anchor sutures.  We thoroughly irrigated out the area and reapproximated the deltoid tendon and muscle in the usual fashion.  The main part of the wound was closed with subcuticular in the usual fashion.  Sterile dressings were applied.  She was placed  in a shoulder immobilizer.  I will keep her overnight for pain control though.  LN/NUANCE  D:10/12/2019 T:10/12/2019 JOB:[id number]

## 2019-10-12 NOTE — Plan of Care (Signed)

## 2019-10-12 NOTE — Discharge Instructions (Addendum)
Keep your sling on at all times, including sleeping in your sling. You may remove your sling is to shower only but you need to keep your hand against your chest while you shower.  You may remove your sling 2-3 times a day for 5-10 minutes to do some slow, gradual, flexing and straightening of the elbow The dressing is waterproof. You can shower with it on.   No driving  Call Dr. Gladstone Lighter if any wound complications or temperature of 101 degrees F or over.  Call the office for an appointment to see Dr. Gladstone Lighter in two weeks: [phone] and ask for Dr. Charlestine Night nurse, Brunilda Payor.

## 2019-10-13 ENCOUNTER — Encounter (HOSPITAL_COMMUNITY): Payer: Self-pay | Admitting: Orthopedic Surgery

## 2019-10-13 DIAGNOSIS — Z96653 Presence of artificial knee joint, bilateral: Secondary | ICD-10-CM | POA: Diagnosis not present

## 2019-10-13 DIAGNOSIS — M75121 Complete rotator cuff tear or rupture of right shoulder, not specified as traumatic: Secondary | ICD-10-CM | POA: Diagnosis not present

## 2019-10-13 DIAGNOSIS — M7541 Impingement syndrome of right shoulder: Secondary | ICD-10-CM | POA: Diagnosis not present

## 2019-10-13 DIAGNOSIS — F17210 Nicotine dependence, cigarettes, uncomplicated: Secondary | ICD-10-CM | POA: Diagnosis not present

## 2019-10-13 DIAGNOSIS — I10 Essential (primary) hypertension: Secondary | ICD-10-CM | POA: Diagnosis not present

## 2019-10-13 MED ORDER — ONDANSETRON HCL 4 MG PO TABS: 4.0000 mg | ORAL_TABLET | Freq: Four times a day (QID) | ORAL | 0 refills | Status: DC | PRN

## 2019-10-13 MED ORDER — METHOCARBAMOL 500 MG PO TABS: 500.0000 mg | ORAL_TABLET | Freq: Four times a day (QID) | ORAL | 0 refills | Status: DC | PRN

## 2019-10-13 MED ORDER — OXYCODONE HCL 5 MG PO TABS: 5.0000 mg | ORAL_TABLET | Freq: Four times a day (QID) | ORAL | 0 refills | Status: DC | PRN

## 2019-10-13 NOTE — Progress Notes (Signed)
OT Cancellation Note  Patient Details Name: [name] MRN: [id number] DOB: 19-Jan-1966   Cancelled Treatment:    Reason Eval/Treat Not Completed: Other (comment). When I was getting things ready for ADL, I spilled liquid soap over linen and pt's clothing.  Pt was understandably upset and stated that she didn't need OT.  Dr Gladstone Lighter reviewed all with her, and she feels comfortable with her self care.  Changed linen and apologized.  Pt has a tshirt, so I reviewed sequence. PT does need a 3:1 commode for home as she usually pushes up with RUE when standing from commode.  She will follow up with Dr Gladstone Lighter.  Rosine Solecki 10/13/2019, 12:15 PM  Lesle Chris, OTR/L Acute Rehabilitation Services [phone] [facility] pager [phone] office 10/13/2019

## 2019-10-13 NOTE — TOC Transition Note (Signed)
Transition of Care [facility]) - CM/SW Discharge Note   Patient Details  Name: [name] MRN: [id number] Date of Birth: 1966-03-10  Transition of Care [facility]) CM/SW Contact:  Leeroy Cha, RN Phone Number: 10/13/2019, 1:58 PM   Clinical Narrative:    dcd to home 3in1 ordered   Final next level of care: Home/Self Care Barriers to Discharge: No Barriers Identified   Patient Goals and CMS Choice        Discharge Placement                       Discharge Plan and Services                DME Arranged: 3-N-1 DME Agency: [employer] Date DME Agency Contacted: 10/13/19 Time DME Agency Contacted: [phone] Representative spoke with at DME Agency: [email or web address]            Social Determinants of Health ([place]) Interventions     Readmission Risk Interventions No flowsheet data found.

## 2019-10-13 NOTE — Progress Notes (Signed)
OT Cancellation Note  Patient Details Name: [name] MRN: [id number] DOB: [birth year]   Cancelled Treatment:    Reason Eval/Treat Not Completed: Pain limiting ability to participate. Checked on pt earlier this am and pain was too great. She has not had more pain medication; will check back after next dose.  Lauris Keepers 10/13/2019, 10:55 AM  Lesle Chris, OTR/L Acute Rehabilitation Services [phone] [facility] pager [phone] office 10/13/2019

## 2019-10-13 NOTE — Progress Notes (Signed)
   Subjective: 1 Day Post-Op Procedure(s) (LRB): Right shoulder rotator cuff repair with graft and anchors (Right) Patient reports pain as moderate.   Patient seen in rounds with Dr. Gladstone Lighter. Patient is well, and has had no acute complaints or problems other than pain in the right shoulder. She has had some increased pain since the block wore off. No SOB or chest pain. Voiding well. Positive flatus.  Plan is to go Home after hospital stay.  Objective: Vital signs in last 24 hours: Temp:  [97.4 F (36.3 C)-98.4 F (36.9 C)] 98.4 F (36.9 C) (10/22 0015) Pulse Rate:  [46-67] 55 (10/22 0015) Resp:  [13-23] 18 (10/22 0015) BP: (126-167)/(67-93) 167/78 (10/22 0015) SpO2:  [94 %-100 %] 99 % (10/22 0015) Weight:  [108.9 kg] 108.9 kg (10/21 1108)  Intake/Output from previous day:  Intake/Output Summary (Last 24 hours) at 10/13/2019 0708 Last data filed at 10/13/2019 0631 Gross per 24 hour  Intake 3560 ml  Output 50 ml  Net 3510 ml     Labs: Recent Labs    10/10/19 1407  HGB 10.5*   Recent Labs    10/10/19 1407  WBC 5.0  RBC 3.76*  HCT 33.8*  PLT 220   Recent Labs    10/10/19 1407  NA 138  K 4.3  CL 105  CO2 26  BUN 13  CREATININE 0.89  GLUCOSE 95  CALCIUM 9.4   Recent Labs    10/10/19 1407  INR 0.9    EXAM General - Patient is Alert and Oriented Extremity - Neurologically intact Intact pulses distally No cellulitis present Dressing/Incision - clean, dry, no drainage Aquacel in place Motor Function - intact, moving hand and fingers well on exam   Past Medical History:  Diagnosis Date  . Anemia   . Fibroids   . Heart palpitations    History of   . History of blood transfusion    with knee replacement  . History of colon polyps   . Hypertension   . Hypertension    no current meds for htn  . Knee joint injury   . Pneumonia    remote history  . Thyroid goiter    bilateral goiter and cyst follow up every 2 years Dr. Erik Obey     Assessment/Plan: 1 Day Post-Op Procedure(s) (LRB): Right shoulder rotator cuff repair with graft and anchors (Right) Active Problems:   Non-traumatic rotator cuff tear, right  Estimated body mass index is 38.74 kg/m as calculated from the following:   Height as of this encounter: 5\' 6"  (1.676 m).   Weight as of this encounter: 108.9 kg. Advance diet Up with therapy D/C IV fluids when tolerating POs well   DVT Prophylaxis - SCDs  Will have OT work with her this morning. Plan for DC home today. Follow up in office in 10 days. Discharge instructions given.   Ardeen Jourdain, PA-C Orthopaedic Surgery 10/13/2019, 7:08 AM

## 2019-10-13 NOTE — Progress Notes (Signed)
Patient discharged to home w/ family. Given all belongings, instructions, equipment. Verbalized understanding of all instructions. Escorted to pov via w/c. 

## 2019-10-17 NOTE — Discharge Summary (Signed)
Physician Discharge Summary   Patient ID: [name] MRN: [id number] DOB/AGE: September 15, 1966 53 y.o.  Admit date: 10/12/2019 Discharge date: 10/17/2019  Primary Diagnosis:   Right shoulder rotator cuff tear  Admission Diagnoses:  Past Medical History:  Diagnosis Date  . Anemia   . Fibroids   . Heart palpitations    History of   . History of blood transfusion    with knee replacement  . History of colon polyps   . Hypertension   . Hypertension    no current meds for htn  . Knee joint injury   . Pneumonia    remote history  . Thyroid goiter    bilateral goiter and cyst follow up every 2 years Dr. Erik Obey   Discharge Diagnoses:   Active Problems:   Non-traumatic rotator cuff tear, right  Procedure:  Procedure(s) (LRB): Right shoulder rotator cuff repair with graft and anchors (Right)   Consults: None  HPI: [name] is a 53 year old female who presented to the office with right shoulder pain. She denies specific injury. She developed progressive worsening in motion and strength as well. MRI showed a right shoulder retracted rotator cuff tear.    Laboratory Data: Hospital Outpatient Visit on 10/10/2019  Component Date Value Ref Range Status  . aPTT 10/10/2019 28  24 - 36 seconds Final   Performed at [facility], [place] [address] [place], [place] [address]  . WBC 10/10/2019 5.0  4.0 - 10.5 K/uL Final  . RBC 10/10/2019 3.76* 3.87 - 5.11 MIL/uL Final  . Hemoglobin 10/10/2019 10.5* 12.0 - 15.0 g/dL Final  . HCT 10/10/2019 33.8* 36.0 - 46.0 % Final  . MCV 10/10/2019 89.9  80.0 - 100.0 fL Final  . MCH 10/10/2019 27.9  26.0 - 34.0 pg Final  . MCHC 10/10/2019 31.1  30.0 - 36.0 g/dL Final  . RDW 10/10/2019 18.3* 11.5 - 15.5 % Final  . Platelets 10/10/2019 220  150 - 400 K/uL Final  . nRBC 10/10/2019 0.0  0.0 - 0.2 % Final  . Neutrophils Relative % 10/10/2019 52  % Final  . Neutro Abs 10/10/2019 2.6  1.7 - 7.7 K/uL Final  . Lymphocytes Relative  10/10/2019 34  % Final  . Lymphs Abs 10/10/2019 1.7  0.7 - 4.0 K/uL Final  . Monocytes Relative 10/10/2019 8  % Final  . Monocytes Absolute 10/10/2019 0.4  0.1 - 1.0 K/uL Final  . Eosinophils Relative 10/10/2019 5  % Final  . Eosinophils Absolute 10/10/2019 0.3  0.0 - 0.5 K/uL Final  . Basophils Relative 10/10/2019 1  % Final  . Basophils Absolute 10/10/2019 0.0  0.0 - 0.1 K/uL Final  . Immature Granulocytes 10/10/2019 0  % Final  . Abs Immature Granulocytes 10/10/2019 0.01  0.00 - 0.07 K/uL Final   Performed at [facility], [place] [address], [place], [place] [address]  . Sodium 10/10/2019 138  135 - 145 mmol/L Final  . Potassium 10/10/2019 4.3  3.5 - 5.1 mmol/L Final  . Chloride 10/10/2019 105  98 - 111 mmol/L Final  . CO2 10/10/2019 26  22 - 32 mmol/L Final  . Glucose, Bld 10/10/2019 95  70 - 99 mg/dL Final  . BUN 10/10/2019 13  6 - 20 mg/dL Final  . Creatinine, Ser 10/10/2019 0.89  0.44 - 1.00 mg/dL Final  . Calcium 10/10/2019 9.4  8.9 - 10.3 mg/dL Final  . Total Protein 10/10/2019 7.5  6.5 - 8.1 g/dL Final  . Albumin  10/10/2019 4.1  3.5 - 5.0 g/dL Final  . AST 10/10/2019 18  15 - 41 U/L Final  . ALT 10/10/2019 13  0 - 44 U/L Final  . Alkaline Phosphatase 10/10/2019 72  38 - 126 U/L Final  . Total Bilirubin 10/10/2019 0.5  0.3 - 1.2 mg/dL Final  . GFR calc non Af Amer 10/10/2019 >60  >60 mL/min Final  . GFR calc Af Amer 10/10/2019 >60  >60 mL/min Final  . Anion gap 10/10/2019 7  5 - 15 Final   Performed at [facility], [place] [address] [place], [place] [address]  . Prothrombin Time 10/10/2019 12.4  11.4 - 15.2 seconds Final  . INR 10/10/2019 0.9  0.8 - 1.2 Final   Comment: (NOTE) INR goal varies based on device and disease states. Performed at [facility], [place] [address], [place], [place] [address]   . ABO/RH(D) 10/10/2019 AB POS   Final  . Antibody Screen 10/10/2019 NEG   Final  . Sample Expiration 10/10/2019  10/15/2019,2359   Final  . Extend sample reason 10/10/2019    Final                   Value:NO TRANSFUSIONS OR PREGNANCY IN THE PAST 3 MONTHS Performed at [place] [address], [place], [place] [address]      Hospital Course: Patient was admitted to [facility] and taken to the OR and underwent the above state procedure without complications.  Patient tolerated the procedure well and was later transferred to the recovery room and then to the orthopaedic floor for postoperative care.  They were given PO and IV analgesics for pain control following their surgery.  They were given 24 hours of postoperative antibiotics.   OT was consulted.  Discharge planning was consulted to help with postop disposition and equipment needs.  Patient had a fair night on the evening of surgery and started to get up OOB with therapy on day one. Patient was seen in rounds and was ready to go home on day one.  They were given discharge instructions and dressing directions.  They were instructed on when to follow up in the office with Dr. Gladstone Lighter.   Diet: Cardiac diet Activity:Wear sling at all times Follow-up:in 10 days Disposition - Home Discharged Condition: stable   Discharge Instructions    Call MD / Call 911   Complete by: As directed    If you experience chest pain or shortness of breath, CALL 911 and be transported to the hospital emergency room.  If you develope a fever above 101 F, pus (white drainage) or increased drainage or redness at the wound, or calf pain, call your surgeon's office.   Constipation Prevention   Complete by: As directed    Drink plenty of fluids.  Prune juice may be helpful.  You may use a stool softener, such as Colace (over the counter) 100 mg twice a day.  Use MiraLax (over the counter) for constipation as needed.   Diet - low sodium heart healthy   Complete by: As directed    Discharge instructions   Complete by: As directed    Keep your  sling on at all times, including sleeping in your sling. You may remove your sling is to shower only but you need to keep your hand against your chest while you shower.  You may remove your sling 2-3 times a day for 5-10 minutes to do some slow,  gradual, flexing and straightening of the elbow The dressing is waterproof. You can shower with it on.   No driving  Call Dr. Gladstone Lighter if any wound complications or temperature of 101 degrees F or over.  Call the office for an appointment to see Dr. Gladstone Lighter in two weeks: [phone] and ask for Dr. Charlestine Night nurse, Brunilda Payor.   Increase activity slowly as tolerated   Complete by: As directed      Allergies as of 10/13/2019      Reactions   Tramadol Itching   Ibuprofen Nausea And Vomiting   Metronidazole Itching, Nausea And Vomiting   Penicillins Itching   Denies hives, or airway issues.       Medication List    STOP taking these medications   azithromycin 250 MG tablet Commonly known as: ZITHROMAX   baclofen 10 MG tablet Commonly known as: LIORESAL   celecoxib 200 MG capsule Commonly known as: CeleBREX   erythromycin with ethanol 2 % gel Commonly known as: EMGEL   fluticasone 50 MCG/ACT nasal spray Commonly known as: FLONASE   lidocaine 2 % solution Commonly known as: XYLOCAINE   oxyCODONE-acetaminophen 5-325 MG tablet Commonly known as: Percocet   predniSONE 50 MG tablet Commonly known as: DELTASONE   Voltaren 1 % Gel Generic drug: diclofenac sodium     TAKE these medications   atenolol 25 MG tablet Commonly known as: TENORMIN Take 25 mg by mouth daily. What changed: Another medication with the same name was removed. Continue taking this medication, and follow the directions you see here.   methocarbamol 500 MG tablet Commonly known as: ROBAXIN Take 1 tablet (500 mg total) by mouth every 6 (six) hours as needed for muscle spasms. What changed:   when to take this  reasons to take this   ondansetron 4 MG  tablet Commonly known as: ZOFRAN Take 1 tablet (4 mg total) by mouth every 6 (six) hours as needed for nausea. What changed:   when to take this  reasons to take this   oxyCODONE 5 MG immediate release tablet Commonly known as: Oxy IR/ROXICODONE Take 1-2 tablets (5-10 mg total) by mouth every 6 (six) hours as needed for moderate pain (pain score 4-6).      Follow-up Information    Latanya Maudlin, MD. Schedule an appointment as soon as possible for a visit on 10/24/2019.   Specialty: Orthopedic Surgery Contact information: [address] [place] [address] [phone]           Signed: Ardeen Jourdain, PA-C Orthopaedic Surgery 10/17/2019, 7:50 AM

## 2019-11-11 ENCOUNTER — Encounter (HOSPITAL_COMMUNITY): Payer: Self-pay

## 2019-11-11 ENCOUNTER — Other Ambulatory Visit: Payer: Self-pay

## 2019-11-11 ENCOUNTER — Emergency Department (HOSPITAL_COMMUNITY)
Admission: EM | Admit: 2019-11-11 | Discharge: 2019-11-11 | Disposition: A | Discharge status: Home / Self Care | Payer: Medicare Other | Attending: Emergency Medicine | Admitting: Emergency Medicine

## 2019-11-11 DIAGNOSIS — Y929 Unspecified place or not applicable: Secondary | ICD-10-CM | POA: Insufficient documentation

## 2019-11-11 DIAGNOSIS — S80861A Insect bite (nonvenomous), right lower leg, initial encounter: Secondary | ICD-10-CM | POA: Diagnosis present

## 2019-11-11 DIAGNOSIS — L03115 Cellulitis of right lower limb: Secondary | ICD-10-CM | POA: Diagnosis not present

## 2019-11-11 DIAGNOSIS — Z79899 Other long term (current) drug therapy: Secondary | ICD-10-CM | POA: Diagnosis not present

## 2019-11-11 DIAGNOSIS — Y999 Unspecified external cause status: Secondary | ICD-10-CM | POA: Diagnosis not present

## 2019-11-11 DIAGNOSIS — Z96652 Presence of left artificial knee joint: Secondary | ICD-10-CM | POA: Diagnosis not present

## 2019-11-11 DIAGNOSIS — Y939 Activity, unspecified: Secondary | ICD-10-CM | POA: Diagnosis not present

## 2019-11-11 DIAGNOSIS — W57XXXA Bitten or stung by nonvenomous insect and other nonvenomous arthropods, initial encounter: Secondary | ICD-10-CM | POA: Diagnosis not present

## 2019-11-11 DIAGNOSIS — I10 Essential (primary) hypertension: Secondary | ICD-10-CM | POA: Insufficient documentation

## 2019-11-11 DIAGNOSIS — F17210 Nicotine dependence, cigarettes, uncomplicated: Secondary | ICD-10-CM | POA: Insufficient documentation

## 2019-11-11 MED ORDER — SULFAMETHOXAZOLE-TRIMETHOPRIM 800-160 MG PO TABS: 1.0000 | ORAL_TABLET | Freq: Two times a day (BID) | ORAL | 0 refills | Status: AC

## 2019-11-11 MED ORDER — ONDANSETRON 4 MG PO TBDP
4.0000 mg | ORAL_TABLET | Freq: Once | ORAL | Status: AC
  Administered 2019-11-11: 4 mg via ORAL
  Filled 2019-11-11: qty 1

## 2019-11-11 MED ORDER — SULFAMETHOXAZOLE-TRIMETHOPRIM 800-160 MG PO TABS
1.0000 | ORAL_TABLET | Freq: Once | ORAL | Status: AC
  Administered 2019-11-11: 1 via ORAL
  Filled 2019-11-11: qty 1

## 2019-11-11 MED ORDER — ONDANSETRON 4 MG PO TBDP: 4.0000 mg | ORAL_TABLET | Freq: Three times a day (TID) | ORAL | 0 refills | Status: DC | PRN

## 2019-11-11 NOTE — Discharge Instructions (Signed)
As discussed, you have an infection of the lower leg.  This may be due to insect bites, or other minor trauma. Please take medication as directed, monitor your condition carefully, and do not hesitate to return here if you develop new, or concerning changes in your condition.

## 2019-11-11 NOTE — ED Triage Notes (Addendum)
Pt states about 3 days ago, she may have been bitten by a spider. There are 2 red spots on her right lower leg, one on the inside of her leg, and another higher on the outside of her calf. Right leg is warmer than left, and pt reports tenderness and itchiness. Pt ambulatory in triage.

## 2019-11-11 NOTE — ED Notes (Signed)
Patient right calf is reddened. Patient states a spider bit her.

## 2019-11-11 NOTE — ED Provider Notes (Signed)
[facility] DEPT Provider Note   CSN: [id number] Arrival date & time: 11/11/19  [id number]     History   Chief Complaint Chief Complaint  Patient presents with  . Insect Bite    HPI Janet RIKIYAH Lambert is a 53 y.o. female.     HPI Patient presents with concern of 2 red irritating lesions on her right leg. She is unclear of what the actual precipitant was, but beginning about 3 days ago she noticed 2 areas, one in the medial distal, the other on the lateral proximal calf, both with erythema, tenderness to palpation, irritation. There is been some associated nausea, some headache, but no fever, no vomiting, no other new pain. Patient has history of fall recently, but otherwise has been in her usual state of health. Since onset 3 days ago, no medication taken for relief.  With increasing discomfort, irritation, and ongoing headache, nausea, she presents for evaluation.  Past Medical History:  Diagnosis Date  . Anemia   . Fibroids   . Heart palpitations    History of   . History of blood transfusion    with knee replacement  . History of colon polyps   . Hypertension   . Hypertension    no current meds for htn  . Knee joint injury   . Pneumonia    remote history  . Thyroid goiter    bilateral goiter and cyst follow up every 2 years Dr. Erik Obey    Patient Active Problem List   Diagnosis Date Noted  . Non-traumatic rotator cuff tear, right 10/12/2019  . Intermittent palpitations 11/17/2017  . Menopause 06/23/2017  . Hot flashes 09/23/2016  . Healthcare maintenance 09/23/2016  . S/P total knee arthroplasty, 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  . Uterine fibroid 05/09/2013  . Tobacco use disorder 04/12/2013  . OA (osteoarthritis) 10/28/2011  . 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 MORBID OBESITY 09/17/2009  . Essential hypertension 09/17/2009  . Pain in limb 09/17/2009  . ANEMIA, HX OF 09/17/2009    Past Surgical History:  Procedure Laterality Date  .  ANKLE SURGERY Right 2009   broken ankle   . arthroscopic surgery to knee Bilateral   . CESAREAN SECTION    . COLONOSCOPY    . HAND SURGERY Bilateral   . JOINT REPLACEMENT Bilateral    knees  . REPLACEMENT TOTAL KNEE Right 2012  . SHOULDER OPEN ROTATOR CUFF REPAIR Right 10/12/2019   Procedure: Right shoulder rotator cuff repair with graft and anchors;  Surgeon: Latanya Maudlin, MD;  Location: [facility] ORS;  Service: Orthopedics;  Laterality: Right;  74min  . TOTAL KNEE ARTHROPLASTY Left 01/03/2014   Procedure: LEFT TOTAL KNEE ARTHROPLASTY;  Surgeon: Tobi Bastos, MD;  Location: [facility];  Service: Orthopedics;  Laterality: Left;  . TUBAL LIGATION       OB History    Gravida  4   Para  2   Term  2   Preterm  0   AB  2   Living  2     SAB  1   TAB  1   Ectopic  0   Multiple      Live Births  2            Home Medications    Prior to Admission medications   Medication Sig Start Date End Date Taking? Authorizing Provider  atenolol (TENORMIN) 25 MG tablet Take 25 mg by mouth daily. 09/28/19   [provider]  methocarbamol (ROBAXIN) 500 MG  tablet Take 1 tablet (500 mg total) by mouth every 6 (six) hours as needed for muscle spasms. 10/13/19   Constable, Amber, PA-C  ondansetron (ZOFRAN ODT) 4 MG disintegrating tablet Take 1 tablet (4 mg total) by mouth every 8 (eight) hours as needed for nausea or vomiting. 11/11/19   Carmin Muskrat, MD  oxyCODONE (OXY IR/ROXICODONE) 5 MG immediate release tablet Take 1-2 tablets (5-10 mg total) by mouth every 6 (six) hours as needed for moderate pain (pain score 4-6). 10/13/19   Constable, Amber, PA-C  sulfamethoxazole-trimethoprim (BACTRIM DS) 800-160 MG tablet Take 1 tablet by mouth 2 (two) times daily for 7 days. 11/11/19 11/18/19  Carmin Muskrat, MD    Family History Family History  Problem Relation Age of Onset  . Hypertension Mother   . Hypertension Father   . Cancer Maternal Grandmother        ? lung     Social History Social History   Tobacco Use  . Smoking status: Current Every Day Smoker    Packs/day: 0.20    Years: 20.00    Pack years: 4.00    Types: Cigarettes  . Smokeless tobacco: Never Used  . Tobacco comment: 1-2  cigs per day  Substance Use Topics  . Alcohol use: Yes    Alcohol/week: 2.0 standard drinks    Types: 2 Standard drinks or equivalent per week    Comment: occasionally once per month  . Drug use: No     Allergies   Tramadol, Ibuprofen, Metronidazole, and Penicillins   Review of Systems Review of Systems  Constitutional:       Per HPI, otherwise negative  HENT:       Per HPI, otherwise negative  Respiratory:       Per HPI, otherwise negative  Cardiovascular:       Per HPI, otherwise negative  Gastrointestinal: Positive for nausea. Negative for vomiting.  Endocrine:       Negative aside from HPI  Genitourinary:       Neg aside from HPI   Musculoskeletal:       Per HPI, otherwise negative  Skin: Positive for color change and wound.  Neurological: Positive for headaches. Negative for syncope and weakness.     Physical Exam Updated Vital Signs BP (!) 160/84 (BP Location: Left Arm)   Pulse 87   Temp 98 F (36.7 C) (Oral)   Resp 18   LMP 06/07/2016   SpO2 100%   Physical Exam Vitals signs and nursing note reviewed.  Constitutional:      General: She is not in acute distress.    Appearance: She is well-developed.  HENT:     Head: Normocephalic and atraumatic.  Eyes:     Conjunctiva/sclera: Conjunctivae normal.  Cardiovascular:     Rate and Rhythm: Normal rate and regular rhythm.     Comments: Dorsalis pedis and posterior tibial pulses both appreciable right foot Pulmonary:     Effort: Pulmonary effort is normal. No respiratory distress.     Breath sounds: No stridor.  Abdominal:     General: There is no distension.  Musculoskeletal:       Legs:  Skin:    General: Skin is warm and dry.       Neurological:     Mental Status:  She is alert and oriented to person, place, and time.     Cranial Nerves: No cranial nerve deficit.      ED Treatments / Results   Procedures Procedures (including critical  care time)  Medications Ordered in ED Medications  sulfamethoxazole-trimethoprim (BACTRIM DS) 800-160 MG per tablet 1 tablet (has no administration in time range)  ondansetron (ZOFRAN-ODT) disintegrating tablet 4 mg (has no administration in time range)     Initial Impression / Assessment and Plan / ED Course  I have reviewed the triage vital signs and the nursing notes.  Pertinent labs & imaging results that were available during my care of the patient were reviewed by me and considered in my medical decision making (see chart for details).  Generally well-appearing female, though with a right arm in sling from prior fall presents with concern for 2 distinct lesions in her right leg. Unclear what the precipitant was, but may have been insect bite, versus other minor trauma. However, there is evidence for cellulitis, without bacteremia, sepsis, without distal neurovascular compromise.  Patient started on antibiotics, antiemetics, discharged in stable condition.  Final Clinical Impressions(s) / ED Diagnoses   Final diagnoses:  Cellulitis of right lower extremity    ED Discharge Orders         Ordered    ondansetron (ZOFRAN ODT) 4 MG disintegrating tablet  Every 8 hours PRN     11/11/19 0839    sulfamethoxazole-trimethoprim (BACTRIM DS) 800-160 MG tablet  2 times daily     11/11/19 0839           Carmin Muskrat, MD 11/11/19 [phone]

## 2019-11-25 DIAGNOSIS — M25511 Pain in right shoulder: Secondary | ICD-10-CM | POA: Diagnosis not present

## 2020-02-17 ENCOUNTER — Other Ambulatory Visit: Payer: Self-pay | Admitting: Obstetrics and Gynecology

## 2020-03-23 ENCOUNTER — Inpatient Hospital Stay: Admission: RE | Admit: 2020-03-23 | Payer: Medicare Other | Source: Ambulatory Visit

## 2020-06-07 DIAGNOSIS — M79641 Pain in right hand: Secondary | ICD-10-CM | POA: Diagnosis not present

## 2020-06-07 DIAGNOSIS — S6981XA Other specified injuries of right wrist, hand and finger(s), initial encounter: Secondary | ICD-10-CM | POA: Diagnosis not present

## 2020-07-24 ENCOUNTER — Other Ambulatory Visit: Payer: Medicare Other

## 2020-08-01 DIAGNOSIS — G5601 Carpal tunnel syndrome, right upper limb: Secondary | ICD-10-CM | POA: Diagnosis not present

## 2020-08-02 ENCOUNTER — Ambulatory Visit: Payer: Medicare Other

## 2020-08-02 ENCOUNTER — Ambulatory Visit
Admission: RE | Admit: 2020-08-02 | Discharge: 2020-08-02 | Disposition: A | Discharge status: Home / Self Care | Payer: Medicare Other | Source: Ambulatory Visit | Attending: Surgery | Admitting: Surgery

## 2020-08-02 ENCOUNTER — Other Ambulatory Visit: Payer: Self-pay

## 2020-08-02 DIAGNOSIS — N6452 Nipple discharge: Secondary | ICD-10-CM

## 2020-08-09 DIAGNOSIS — G5601 Carpal tunnel syndrome, right upper limb: Secondary | ICD-10-CM | POA: Diagnosis not present

## 2020-08-21 ENCOUNTER — Ambulatory Visit (HOSPITAL_COMMUNITY)
Admission: EM | Admit: 2020-08-21 | Discharge: 2020-08-21 | Disposition: A | Discharge status: Home / Self Care | Payer: Medicare Other | Attending: Family Medicine | Admitting: Family Medicine

## 2020-08-21 ENCOUNTER — Ambulatory Visit
Admission: RE | Admit: 2020-08-21 | Discharge: 2020-08-21 | Disposition: A | Discharge status: Home / Self Care | Payer: Medicare Other | Source: Ambulatory Visit | Attending: Surgery | Admitting: Surgery

## 2020-08-21 ENCOUNTER — Ambulatory Visit (HOSPITAL_COMMUNITY)
Admission: EM | Admit: 2020-08-21 | Discharge: 2020-08-21 | Discharge status: Against Medical Advice | Payer: Medicare Other

## 2020-08-21 ENCOUNTER — Encounter (HOSPITAL_COMMUNITY): Payer: Self-pay | Admitting: Emergency Medicine

## 2020-08-21 ENCOUNTER — Other Ambulatory Visit: Payer: Self-pay

## 2020-08-21 DIAGNOSIS — R109 Unspecified abdominal pain: Secondary | ICD-10-CM | POA: Diagnosis not present

## 2020-08-21 DIAGNOSIS — R928 Other abnormal and inconclusive findings on diagnostic imaging of breast: Secondary | ICD-10-CM | POA: Diagnosis not present

## 2020-08-21 DIAGNOSIS — N760 Acute vaginitis: Secondary | ICD-10-CM | POA: Diagnosis not present

## 2020-08-21 DIAGNOSIS — N6452 Nipple discharge: Secondary | ICD-10-CM

## 2020-08-21 LAB — POCT URINALYSIS DIPSTICK, ED / UC
Glucose, UA: 100 mg/dL — AB
Hgb urine dipstick: NEGATIVE
Leukocytes,Ua: NEGATIVE
Nitrite: NEGATIVE
Protein, ur: NEGATIVE mg/dL
Specific Gravity, Urine: >=1.03 (ref 1.005–1.030)
Urobilinogen, UA: 0.2 mg/dL (ref 0.0–1.0)
pH: 5 (ref 5.0–8.0)

## 2020-08-21 MED ORDER — TERCONAZOLE 0.8 % VA CREA
1.0000 | TOPICAL_CREAM | Freq: Every day | VAGINAL | 0 refills | Status: DC
Start: 2020-08-21 — End: 2021-11-26

## 2020-08-21 NOTE — Discharge Instructions (Signed)
Use the cream as prescribed Sending swab for testing  Urine without infection.  Follow up as needed for continued or worsening symptoms

## 2020-08-21 NOTE — ED Triage Notes (Signed)
Pt presents with vaginal irritation, abdominal pain, minor bleeding, urinary frequency and dysuria xs 1 wk.

## 2020-08-21 NOTE — ED Provider Notes (Signed)
[facility]    CSN: [id number] Arrival date & time: 08/21/20  [id number]      History   Chief Complaint Chief Complaint  Patient presents with  . Abdominal Pain    HPI [name] is a 54 y.o. female.   Patient is a 54 year old female who presents today with vaginal irritation, itching, intermittent abdominal cramping and bleeding with wiping for the past couple days.  Also having some urinary frequency and dysuria.  No fevers, chills, back pain, flank pain, nausea or vomiting.  Is currently sexually active and concern for STDs at this time. Patient's last menstrual period was 06/07/2016.  ROS per HPI      Past Medical History:  Diagnosis Date  . Anemia   . Fibroids   . Heart palpitations    History of   . History of blood transfusion    with knee replacement  . History of colon polyps   . Hypertension   . Hypertension    no current meds for htn  . Knee joint injury   . Pneumonia    remote history  . Thyroid goiter    bilateral goiter and cyst follow up every 2 years Dr. Erik Obey    Patient Active Problem List   Diagnosis Date Noted  . Non-traumatic rotator cuff tear, right 10/12/2019  . Intermittent palpitations 11/17/2017  . Menopause 06/23/2017  . Hot flashes 09/23/2016  . Healthcare maintenance 09/23/2016  . S/P total knee arthroplasty, 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  . Uterine fibroid 05/09/2013  . Tobacco use disorder 04/12/2013  . OA (osteoarthritis) 10/28/2011  . 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 MORBID OBESITY 09/17/2009  . Essential hypertension 09/17/2009  . Pain in limb 09/17/2009  . ANEMIA, HX OF 09/17/2009    Past Surgical History:  Procedure Laterality Date  . ANKLE SURGERY Right 2009   broken ankle   . arthroscopic surgery to knee Bilateral   . BREAST BIOPSY Left 03/24/2019  . CESAREAN SECTION    . COLONOSCOPY    . HAND SURGERY Bilateral   . JOINT REPLACEMENT Bilateral    knees  . REPLACEMENT TOTAL KNEE Right 2012  . SHOULDER  OPEN ROTATOR CUFF REPAIR Right 10/12/2019   Procedure: Right shoulder rotator cuff repair with graft and anchors;  Surgeon: Latanya Maudlin, MD;  Location: [facility];  Service: Orthopedics;  Laterality: Right;  60min  . TOTAL KNEE ARTHROPLASTY Left 01/03/2014   Procedure: LEFT TOTAL KNEE ARTHROPLASTY;  Surgeon: Tobi Bastos, MD;  Location: [facility];  Service: Orthopedics;  Laterality: Left;  . TUBAL LIGATION      OB History    Gravida  4   Para  2   Term  2   Preterm  0   AB  2   Living  2     SAB  1   TAB  1   Ectopic  0   Multiple      Live Births  2            Home Medications    Prior to Admission medications   Medication Sig Start Date End Date Taking? Authorizing Provider  atenolol (TENORMIN) 25 MG tablet Take 25 mg by mouth daily. 09/28/19  Yes [provider]  terconazole (TERAZOL 3) 0.8 % vaginal cream Place 1 applicator vaginally at bedtime. 08/21/20   Orvan July, NP    Family History Family History  Problem Relation Age of Onset  . Hypertension Mother   . Hypertension Father   .  Cancer Maternal Grandmother        ? lung    Social History Social History   Tobacco Use  . Smoking status: Current Every Day Smoker    Packs/day: 0.20    Years: 20.00    Pack years: 4.00    Types: Cigarettes  . Smokeless tobacco: Never Used  . Tobacco comment: 1-2  cigs per day  Vaping Use  . Vaping Use: Never used  Substance Use Topics  . Alcohol use: Yes    Alcohol/week: 2.0 standard drinks    Types: 2 Standard drinks or equivalent per week    Comment: occasionally once per month  . Drug use: No     Allergies   Tramadol, Ibuprofen, Metronidazole, and Penicillins   Review of Systems Review of Systems   Physical Exam Triage Vital Signs ED Triage Vitals  Enc Vitals Group     BP 08/21/20 1213 (!) 159/107     Pulse Rate 08/21/20 1213 83     Resp 08/21/20 1213 18     Temp 08/21/20 1213 98.2 F (36.8 C)     Temp Source 08/21/20 1213  Oral     SpO2 08/21/20 1213 100 %     Weight --      Height --      Head Circumference --      Peak Flow --      Pain Score 08/21/20 1211 2     Pain Loc --      Pain Edu? --      Excl. in [place]? --    No data found.  Updated Vital Signs BP (!) 159/107 (BP Location: Right Arm)   Pulse 83   Temp 98.2 F (36.8 C) (Oral)   Resp 18   LMP 06/07/2016   SpO2 100%   Visual Acuity Right Eye Distance:   Left Eye Distance:   Bilateral Distance:    Right Eye Near:   Left Eye Near:    Bilateral Near:     Physical Exam Vitals and nursing note reviewed.  Constitutional:      General: She is not in acute distress.    Appearance: Normal appearance. She is not ill-appearing, toxic-appearing or diaphoretic.  HENT:     Head: Normocephalic.     Nose: Nose normal.  Eyes:     Conjunctiva/sclera: Conjunctivae normal.  Pulmonary:     Effort: Pulmonary effort is normal.  Musculoskeletal:        General: Normal range of motion.     Cervical back: Normal range of motion.  Skin:    General: Skin is warm and dry.     Findings: No rash.  Neurological:     Mental Status: She is alert.  Psychiatric:        Mood and Affect: Mood normal.      UC Treatments / Results  Labs (all labs ordered are listed, but only abnormal results are displayed) Labs Reviewed  POCT URINALYSIS DIPSTICK, ED / UC - Abnormal; Notable for the following components:      Result Value   Glucose, UA 100 (*)    Bilirubin Urine SMALL (*)    Ketones, ur TRACE (*)    All other components within normal limits  CERVICOVAGINAL ANCILLARY ONLY    EKG   Radiology No results found.  Procedures Procedures (including critical care time)  Medications Ordered in UC Medications - No data to display  Initial Impression / Assessment and Plan / UC Course  I  have reviewed the triage vital signs and the nursing notes.  Pertinent labs & imaging results that were available during my care of the patient were reviewed by me  and considered in my medical decision making (see chart for details).     Vaginitis Terconazole to treat  Swab sent for testing.  Urine without infection Follow up as needed for continued or worsening symptoms  Final Clinical Impressions(s) / UC Diagnoses   Final diagnoses:  Vaginitis and vulvovaginitis     Discharge Instructions     Use the cream as prescribed Sending swab for testing  Urine without infection.  Follow up as needed for continued or worsening symptoms     ED Prescriptions    Medication Sig Dispense Auth. Provider   terconazole (TERAZOL 3) 0.8 % vaginal cream Place 1 applicator vaginally at bedtime. 20 g Loura Halt A, NP     I have reviewed the PDMP during this encounter.   Orvan July, NP 08/21/20 1244

## 2020-08-22 ENCOUNTER — Telehealth (HOSPITAL_COMMUNITY): Payer: Self-pay | Admitting: Emergency Medicine

## 2020-08-22 LAB — CERVICOVAGINAL ANCILLARY ONLY
Bacterial Vaginitis (gardnerella): POSITIVE — AB
Candida Glabrata: NEGATIVE
Candida Vaginitis: POSITIVE — AB
Chlamydia: NEGATIVE
Comment: NEGATIVE
Comment: NEGATIVE
Comment: NEGATIVE
Comment: NEGATIVE
Comment: NEGATIVE
Comment: NORMAL
Neisseria Gonorrhea: NEGATIVE
Trichomonas: NEGATIVE

## 2020-08-22 MED ORDER — CLINDAMYCIN PHOSPHATE 2 % VA CREA: 1.0000 | TOPICAL_CREAM | Freq: Every day | VAGINAL | 0 refills | Status: DC

## 2020-08-23 MED ORDER — CLINDAMYCIN PHOSPHATE 2 % VA CREA: 1.0000 | TOPICAL_CREAM | Freq: Every day | VAGINAL | 0 refills | Status: AC

## 2020-09-03 DIAGNOSIS — E049 Nontoxic goiter, unspecified: Secondary | ICD-10-CM | POA: Diagnosis not present

## 2020-09-03 DIAGNOSIS — R070 Pain in throat: Secondary | ICD-10-CM | POA: Diagnosis not present

## 2020-09-04 ENCOUNTER — Other Ambulatory Visit: Payer: Self-pay | Admitting: Otolaryngology

## 2020-09-04 DIAGNOSIS — E049 Nontoxic goiter, unspecified: Secondary | ICD-10-CM

## 2020-09-05 DIAGNOSIS — G5601 Carpal tunnel syndrome, right upper limb: Secondary | ICD-10-CM | POA: Diagnosis not present

## 2020-09-07 ENCOUNTER — Other Ambulatory Visit: Payer: Medicare Other

## 2020-09-17 ENCOUNTER — Ambulatory Visit
Admission: RE | Admit: 2020-09-17 | Discharge: 2020-09-17 | Disposition: A | Discharge status: Home / Self Care | Payer: Medicare Other | Source: Ambulatory Visit | Attending: Otolaryngology | Admitting: Otolaryngology

## 2020-09-17 DIAGNOSIS — E049 Nontoxic goiter, unspecified: Secondary | ICD-10-CM

## 2020-09-17 DIAGNOSIS — E041 Nontoxic single thyroid nodule: Secondary | ICD-10-CM | POA: Diagnosis not present

## 2020-10-17 DIAGNOSIS — M25511 Pain in right shoulder: Secondary | ICD-10-CM | POA: Diagnosis not present

## 2020-10-17 DIAGNOSIS — M542 Cervicalgia: Secondary | ICD-10-CM | POA: Diagnosis not present

## 2020-11-08 DIAGNOSIS — M542 Cervicalgia: Secondary | ICD-10-CM | POA: Diagnosis not present

## 2020-11-20 DIAGNOSIS — M542 Cervicalgia: Secondary | ICD-10-CM | POA: Diagnosis not present

## 2020-11-20 DIAGNOSIS — M25531 Pain in right wrist: Secondary | ICD-10-CM | POA: Diagnosis not present

## 2020-11-21 DIAGNOSIS — U071 COVID-19: Secondary | ICD-10-CM | POA: Insufficient documentation

## 2020-11-21 HISTORY — DX: COVID-19: U07.1

## 2020-12-07 ENCOUNTER — Ambulatory Visit (HOSPITAL_COMMUNITY)
Admission: EM | Admit: 2020-12-07 | Discharge: 2020-12-07 | Disposition: A | Discharge status: Home / Self Care | Payer: Medicare Other | Attending: Emergency Medicine | Admitting: Emergency Medicine

## 2020-12-07 ENCOUNTER — Other Ambulatory Visit: Payer: Self-pay

## 2020-12-07 ENCOUNTER — Encounter (HOSPITAL_COMMUNITY): Payer: Self-pay | Admitting: Emergency Medicine

## 2020-12-07 DIAGNOSIS — N760 Acute vaginitis: Secondary | ICD-10-CM | POA: Diagnosis not present

## 2020-12-07 DIAGNOSIS — N898 Other specified noninflammatory disorders of vagina: Secondary | ICD-10-CM | POA: Diagnosis not present

## 2020-12-07 LAB — POCT URINALYSIS DIPSTICK, ED / UC
Bilirubin Urine: NEGATIVE
Glucose, UA: NEGATIVE mg/dL
Hgb urine dipstick: NEGATIVE
Ketones, ur: NEGATIVE mg/dL
Nitrite: NEGATIVE
Protein, ur: NEGATIVE mg/dL
Specific Gravity, Urine: 1.025 (ref 1.005–1.030)
Urobilinogen, UA: 0.2 mg/dL (ref 0.0–1.0)
pH: 6.5 (ref 5.0–8.0)

## 2020-12-07 MED ORDER — FLUCONAZOLE 200 MG PO TABS: ORAL_TABLET | ORAL | 0 refills | Status: DC

## 2020-12-07 MED ORDER — CLINDAMYCIN PHOSPHATE 2 % VA CREA: 1.0000 | TOPICAL_CREAM | Freq: Every day | VAGINAL | 0 refills | Status: AC

## 2020-12-07 NOTE — ED Triage Notes (Signed)
Pt states that she has yellow discharge, itching, lower abdominal pain, slight fishy odor. Sx pain that started a week ago.

## 2020-12-07 NOTE — ED Provider Notes (Signed)
[facility]    CSN: [id number] Arrival date & time: 12/07/20  1047      History   Chief Complaint No chief complaint on file.   HPI [name] is a 54 y.o. female.   [name] presents with complaints of vaginal discharge, itching, and malodor. Initially was itching, but then over the past few days noted increased yellow discharge with odor. No urinary symptoms, although has noted increased concentration to her urine. Not sexually active. Has had similar in the past, was treated for yeast and BV in august of this year. States symptoms started after using a new soap.    ROS per HPI, negative if not otherwise mentioned.      Past Medical History:  Diagnosis Date  . Anemia   . Fibroids   . Heart palpitations    History of   . History of blood transfusion    with knee replacement  . History of colon polyps   . Hypertension   . Hypertension    no current meds for htn  . Knee joint injury   . Pneumonia    remote history  . Thyroid goiter    bilateral goiter and cyst follow up every 2 years Dr. Erik Obey    Patient Active Problem List   Diagnosis Date Noted  . Non-traumatic rotator cuff tear, right 10/12/2019  . Intermittent palpitations 11/17/2017  . Menopause 06/23/2017  . Hot flashes 09/23/2016  . Healthcare maintenance 09/23/2016  . S/P total knee arthroplasty, 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  . Uterine fibroid 05/09/2013  . Tobacco use disorder 04/12/2013  . OA (osteoarthritis) 10/28/2011  . 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 MORBID OBESITY 09/17/2009  . Essential hypertension 09/17/2009  . Pain in limb 09/17/2009  . ANEMIA, HX OF 09/17/2009    Past Surgical History:  Procedure Laterality Date  . ANKLE SURGERY Right 2009   broken ankle   . arthroscopic surgery to knee Bilateral   . BREAST BIOPSY Left 03/24/2019  . CESAREAN SECTION    . COLONOSCOPY    . HAND SURGERY Bilateral   . JOINT REPLACEMENT Bilateral    knees  . REPLACEMENT TOTAL  KNEE Right 2012  . SHOULDER OPEN ROTATOR CUFF REPAIR Right 10/12/2019   Procedure: Right shoulder rotator cuff repair with graft and anchors;  Surgeon: Latanya Maudlin, MD;  Location: [facility];  Service: Orthopedics;  Laterality: Right;  67min  . TOTAL KNEE ARTHROPLASTY Left 01/03/2014   Procedure: LEFT TOTAL KNEE ARTHROPLASTY;  Surgeon: Tobi Bastos, MD;  Location: [facility];  Service: Orthopedics;  Laterality: Left;  . TUBAL LIGATION      OB History    Gravida  4   Para  2   Term  2   Preterm  0   AB  2   Living  2     SAB  1   IAB  1   Ectopic  0   Multiple      Live Births  2            Home Medications    Prior to Admission medications   Medication Sig Start Date End Date Taking? Authorizing Provider  atenolol (TENORMIN) 25 MG tablet Take 25 mg by mouth daily. 09/28/19   [provider]  clindamycin (CLEOCIN) 2 % vaginal cream Place 1 Applicatorful vaginally at bedtime for 7 days. 12/07/20 12/14/20  Zigmund Gottron, NP  fluconazole (DIFLUCAN) 200 MG tablet Take once today. Take second pill at  completion of antibiotics. 12/07/20   Zigmund Gottron, NP  terconazole (TERAZOL 3) 0.8 % vaginal cream Place 1 applicator vaginally at bedtime. 08/21/20   Orvan July, NP    Family History Family History  Problem Relation Age of Onset  . Hypertension Mother   . Hypertension Father   . Cancer Maternal Grandmother        ? lung    Social History Social History   Tobacco Use  . Smoking status: Current Every Day Smoker    Packs/day: 0.20    Years: 20.00    Pack years: 4.00    Types: Cigarettes  . Smokeless tobacco: Never Used  . Tobacco comment: 1-2  cigs per day  Vaping Use  . Vaping Use: Never used  Substance Use Topics  . Alcohol use: Yes    Alcohol/week: 2.0 standard drinks    Types: 2 Standard drinks or equivalent per week    Comment: occasionally once per month  . Drug use: No     Allergies   Tramadol, Ibuprofen, Metronidazole, and  Penicillins   Review of Systems Review of Systems   Physical Exam Triage Vital Signs ED Triage Vitals  Enc Vitals Group     BP 12/07/20 1111 (!) 155/100     Pulse Rate 12/07/20 1111 83     Resp 12/07/20 1111 18     Temp 12/07/20 1111 98.4 F (36.9 C)     Temp Source 12/07/20 1111 Oral     SpO2 12/07/20 1111 98 %     Weight --      Height --      Head Circumference --      Peak Flow --      Pain Score 12/07/20 1110 5     Pain Loc --      Pain Edu? --      Excl. in [place]? --    No data found.  Updated Vital Signs BP (!) 155/100 (BP Location: Left Arm)   Pulse 83   Temp 98.4 F (36.9 C) (Oral)   Resp 18   LMP 06/07/2016   SpO2 98%   Visual Acuity Right Eye Distance:   Left Eye Distance:   Bilateral Distance:    Right Eye Near:   Left Eye Near:    Bilateral Near:     Physical Exam Constitutional:      General: She is not in acute distress.    Appearance: She is well-developed.  Cardiovascular:     Rate and Rhythm: Normal rate.  Pulmonary:     Effort: Pulmonary effort is normal.  Abdominal:     Palpations: Abdomen is not rigid.     Tenderness: There is no abdominal tenderness. There is no right CVA tenderness, left CVA tenderness, guarding or rebound.  Genitourinary:    Comments: Denies sores, lesions, vaginal bleeding; no pelvic pain; gu exam deferred at this time, vaginal self swab collected.   Skin:    General: Skin is warm and dry.  Neurological:     Mental Status: She is alert and oriented to person, place, and time.      UC Treatments / Results  Labs (all labs ordered are listed, but only abnormal results are displayed) Labs Reviewed  POCT URINALYSIS DIPSTICK, ED / UC - Abnormal; Notable for the following components:      Result Value   Leukocytes,Ua LARGE (*)    All other components within normal limits  CERVICOVAGINAL ANCILLARY ONLY    EKG  Radiology No results found.  Procedures Procedures (including critical care  time)  Medications Ordered in UC Medications - No data to display  Initial Impression / Assessment and Plan / UC Course  I have reviewed the triage vital signs and the nursing notes.  Pertinent labs & imaging results that were available during my care of the patient were reviewed by me and considered in my medical decision making (see chart for details).     Yeast and bv treatment initiated empirically pending vaginal cytology. Return precautions provided. Patient verbalized understanding and agreeable to plan.   Final Clinical Impressions(s) / UC Diagnoses   Final diagnoses:  Acute vaginitis  Vaginal discharge     Discharge Instructions     We are treating you for both yeast and BV while your vaginal testing to confirm this is in process.  We will notify of you any positive findings or if any changes to treatment are needed. If normal or otherwise without concern to your results, we will not call you. Please log on to your MyChart to review your results if interested in so.   If symptoms worsen or do not improve in the next week to return to be seen or to follow up with your PCP.     ED Prescriptions    Medication Sig Dispense Auth. Provider   fluconazole (DIFLUCAN) 200 MG tablet Take once today. Take second pill at completion of antibiotics. 2 tablet Augusto Gamble B, NP   clindamycin (CLEOCIN) 2 % vaginal cream Place 1 Applicatorful vaginally at bedtime for 7 days. 40 g Zigmund Gottron, NP     PDMP not reviewed this encounter.   Zigmund Gottron, NP 12/07/20 1156

## 2020-12-07 NOTE — Discharge Instructions (Signed)
We are treating you for both yeast and BV while your vaginal testing to confirm this is in process.  We will notify of you any positive findings or if any changes to treatment are needed. If normal or otherwise without concern to your results, we will not call you. Please log on to your MyChart to review your results if interested in so.   If symptoms worsen or do not improve in the next week to return to be seen or to follow up with your PCP.

## 2020-12-10 LAB — CERVICOVAGINAL ANCILLARY ONLY
Bacterial Vaginitis (gardnerella): NEGATIVE
Candida Glabrata: NEGATIVE
Candida Vaginitis: NEGATIVE
Chlamydia: NEGATIVE
Comment: NEGATIVE
Comment: NEGATIVE
Comment: NEGATIVE
Comment: NEGATIVE
Comment: NEGATIVE
Comment: NORMAL
Neisseria Gonorrhea: NEGATIVE
Trichomonas: POSITIVE — AB

## 2020-12-11 ENCOUNTER — Telehealth (HOSPITAL_COMMUNITY): Payer: Self-pay | Admitting: Emergency Medicine

## 2020-12-11 MED ORDER — TINIDAZOLE 500 MG PO TABS: 2.0000 g | ORAL_TABLET | Freq: Once | ORAL | 0 refills | Status: AC

## 2020-12-12 ENCOUNTER — Telehealth (HOSPITAL_COMMUNITY): Payer: Self-pay | Admitting: Emergency Medicine

## 2020-12-12 MED ORDER — METRONIDAZOLE 500 MG PO TABS: 500.0000 mg | ORAL_TABLET | Freq: Two times a day (BID) | ORAL | 0 refills | Status: DC

## 2020-12-19 ENCOUNTER — Other Ambulatory Visit: Payer: Self-pay

## 2020-12-19 ENCOUNTER — Encounter (HOSPITAL_COMMUNITY): Payer: Self-pay | Admitting: Emergency Medicine

## 2020-12-19 ENCOUNTER — Ambulatory Visit (HOSPITAL_COMMUNITY)
Admission: EM | Admit: 2020-12-19 | Discharge: 2020-12-19 | Disposition: A | Discharge status: Home / Self Care | Payer: Medicare Other | Attending: Family Medicine | Admitting: Family Medicine

## 2020-12-19 DIAGNOSIS — U071 COVID-19: Secondary | ICD-10-CM | POA: Insufficient documentation

## 2020-12-19 DIAGNOSIS — Z885 Allergy status to narcotic agent status: Secondary | ICD-10-CM | POA: Diagnosis not present

## 2020-12-19 DIAGNOSIS — Z886 Allergy status to analgesic agent status: Secondary | ICD-10-CM | POA: Insufficient documentation

## 2020-12-19 DIAGNOSIS — R6883 Chills (without fever): Secondary | ICD-10-CM | POA: Insufficient documentation

## 2020-12-19 DIAGNOSIS — R0981 Nasal congestion: Secondary | ICD-10-CM | POA: Insufficient documentation

## 2020-12-19 DIAGNOSIS — R519 Headache, unspecified: Secondary | ICD-10-CM | POA: Diagnosis not present

## 2020-12-19 DIAGNOSIS — Z881 Allergy status to other antibiotic agents status: Secondary | ICD-10-CM | POA: Insufficient documentation

## 2020-12-19 DIAGNOSIS — Z888 Allergy status to other drugs, medicaments and biological substances status: Secondary | ICD-10-CM | POA: Diagnosis not present

## 2020-12-19 DIAGNOSIS — R197 Diarrhea, unspecified: Secondary | ICD-10-CM | POA: Insufficient documentation

## 2020-12-19 DIAGNOSIS — R059 Cough, unspecified: Secondary | ICD-10-CM | POA: Diagnosis not present

## 2020-12-19 DIAGNOSIS — J069 Acute upper respiratory infection, unspecified: Secondary | ICD-10-CM | POA: Diagnosis not present

## 2020-12-19 DIAGNOSIS — Z880 Allergy status to penicillin: Secondary | ICD-10-CM | POA: Insufficient documentation

## 2020-12-19 DIAGNOSIS — F17210 Nicotine dependence, cigarettes, uncomplicated: Secondary | ICD-10-CM | POA: Diagnosis not present

## 2020-12-19 LAB — SARS CORONAVIRUS 2 (TAT 6-24 HRS): SARS Coronavirus 2: POSITIVE — AB

## 2020-12-19 MED ORDER — BENZONATATE 100 MG PO CAPS: ORAL_CAPSULE | ORAL | 0 refills | Status: DC

## 2020-12-19 NOTE — ED Triage Notes (Signed)
PT C/O: cold sx onset 2 days but sx worsened yest.... Sx include: body aches, headaches, chills, abd pain, diarrhea, cough, runny nose, sore throat  DENIES: fevers  TAKING MEDS: none   A&O x4... NAD... Ambulatory

## 2020-12-19 NOTE — ED Provider Notes (Signed)
[facility]   [id number] 12/19/20 Arrival Time: 0844  ASSESSMENT & PLAN:  1. Viral URI with cough     COVID-19 testing sent. See letter/work note on file for self-isolation guidelines. OTC symptom care as needed.  Meds ordered this encounter  Medications  . benzonatate (TESSALON) 100 MG capsule    Sig: Take 1 capsule by mouth every 8 (eight) hours for cough.    Dispense:  21 capsule    Refill:  0     Follow-up Information    Albertha Ghee, MD.   Specialty: Internal Medicine Why: As needed. Contact information: [address] [place] [address] [phone]               Reviewed expectations re: course of current medical issues. Questions answered. Outlined signs and symptoms indicating need for more acute intervention. Understanding verbalized. After Visit Summary given.   SUBJECTIVE: History from: patient. [name] is a 54 y.o. female who presents with worries regarding COVID-19. Known COVID-19 contact: none. Recent travel: none. Reports: nasal congestion, cough, HA, chills, loose stools; abrupt onset 48 hours ago. Denies: difficulty breathing. Normal PO intake without nausea/emesis.    OBJECTIVE:  Vitals:   12/19/20 1027  BP: (!) 139/95  Pulse: 93  Resp: 20  Temp: 98 F (36.7 C)  TempSrc: Tympanic  SpO2: 99%    General appearance: alert; no distress Eyes: PERRLA; EOMI; conjunctiva normal HENT: [place]; AT; with nasal congestion Neck: supple  Lungs: speaks full sentences without difficulty; unlabored; dry cough Extremities: no edema Skin: warm and dry Neurologic: normal gait Psychological: alert and cooperative; normal mood and affect  Labs:  Labs Reviewed  SARS CORONAVIRUS 2 (TAT 6-24 HRS)      Allergies  Allergen Reactions  . Tramadol Itching  . Ibuprofen Nausea And Vomiting  . Metronidazole Itching and Nausea And Vomiting  . Penicillins Itching    Denies hives, or airway issues.     Past Medical  History:  Diagnosis Date  . Anemia   . Fibroids   . Heart palpitations    History of   . History of blood transfusion    with knee replacement  . History of colon polyps   . Hypertension   . Hypertension    no current meds for htn  . Knee joint injury   . Pneumonia    remote history  . Thyroid goiter    bilateral goiter and cyst follow up every 2 years Dr. Lazarus Salines   Social History   Socioeconomic History  . Marital status: Single    Spouse name: Not on file  . Number of children: 2  . Years of education: Not on file  . Highest education level: Not on file  Occupational History  . Occupation: disabled    Employer: DISABLED   Tobacco Use  . Smoking status: Current Every Day Smoker    Packs/day: 0.20    Years: 20.00    Pack years: 4.00    Types: Cigarettes  . Smokeless tobacco: Never Used  . Tobacco comment: 1-2  cigs per day  Vaping Use  . Vaping Use: Never used  Substance and Sexual Activity  . Alcohol use: Yes    Alcohol/week: 2.0 standard drinks    Types: 2 Standard drinks or equivalent per week    Comment: occasionally once per month  . Drug use: No  . Sexual activity: Yes    Birth control/protection: Surgical  Other Topics Concern  . Not  on file  Social History Narrative   ** Merged History Encounter **       Applying for disability, previously worked in [employer]   Social Determinants of Corporate investment banker Strain: Not on [employer] Insecurity: Not on file  Transportation Needs: Not on file  Physical Activity: Not on file  Stress: Not on file  Social Connections: Not on file  Intimate Partner Violence: Not on file   Family History  Problem Relation Age of Onset  . Hypertension Mother   . Hypertension Father   . Cancer Maternal Grandmother        ? lung   Past Surgical History:  Procedure Laterality Date  . ANKLE SURGERY Right 2009   broken ankle   . arthroscopic surgery to knee Bilateral   . BREAST BIOPSY Left 03/24/2019  .  CESAREAN SECTION    . COLONOSCOPY    . HAND SURGERY Bilateral   . JOINT REPLACEMENT Bilateral    knees  . REPLACEMENT TOTAL KNEE Right 2012  . SHOULDER OPEN ROTATOR CUFF REPAIR Right 10/12/2019   Procedure: Right shoulder rotator cuff repair with graft and anchors;  Surgeon: Ranee Gosselin, MD;  Location: [facility];  Service: Orthopedics;  Laterality: Right;   . TOTAL KNEE ARTHROPLASTY Left 01/03/2014   Procedure: LEFT TOTAL KNEE ARTHROPLASTY;  Surgeon: Jacki Cones, MD;  Location: [facility];  Service: Orthopedics;  Laterality: Left;  . TUBAL LIGATION       Mardella Layman, MD 12/19/20 1047

## 2020-12-19 NOTE — Discharge Instructions (Addendum)
You have been tested for COVID-19 today. °If your test returns positive, you will receive a phone call from [facility] regarding your results. °Negative test results are not called. °Both positive and negative results area always visible on MyChart. °If you do not have a MyChart account, sign up instructions are provided in your discharge papers. °Please do not hesitate to contact us should you have questions or concerns. ° °

## 2020-12-19 NOTE — ED Notes (Signed)
Unable to test for COVID d/t pt not letting me. Pulls her head back as soon as I try test her.... Sts I shouldn't have to go all the way back but she insists she needs the test done.   Education given and adv her that if we don't test her properly, results may be skewed.

## 2020-12-20 ENCOUNTER — Telehealth: Payer: Self-pay | Admitting: Nurse Practitioner

## 2020-12-20 ENCOUNTER — Encounter: Payer: Self-pay | Admitting: Nurse Practitioner

## 2020-12-20 ENCOUNTER — Other Ambulatory Visit: Payer: Self-pay | Admitting: Nurse Practitioner

## 2020-12-20 DIAGNOSIS — I10 Essential (primary) hypertension: Secondary | ICD-10-CM | POA: Insufficient documentation

## 2020-12-20 DIAGNOSIS — U071 COVID-19: Secondary | ICD-10-CM

## 2020-12-20 DIAGNOSIS — Z720 Tobacco use: Secondary | ICD-10-CM

## 2020-12-20 NOTE — Telephone Encounter (Signed)
Called to Discuss with patient about Covid symptoms and the use of the monoclonal antibody infusion for those with mild to moderate Covid symptoms and at a high risk of hospitalization.     Pt appears to qualify for this infusion due to co-morbid conditions and/or a member of an at-risk group in accordance with the FDA Emergency Use Authorization.    Unable to reach pt - left voice mail

## 2020-12-20 NOTE — Progress Notes (Signed)
I connected by phone with [name] on 12/20/2020 at 2:25 PM to discuss the potential use of a new treatment for mild to moderate COVID-19 viral infection in non-hospitalized patients.  This patient is a 54 y.o. female that meets the FDA criteria for Emergency Use Authorization of COVID monoclonal antibody casirivimab/imdevimab, bamlanivimab/etesevimab, or sotrovimab.  Has a (+) direct SARS-CoV-2 viral test result  Has mild or moderate COVID-19   Is NOT hospitalized due to COVID-19  Is within 10 days of symptom onset  Has at least one of the high risk factor(s) for progression to severe COVID-19 and/or hospitalization as defined in EUA.  Specific high risk criteria : BMI > 25, Cardiovascular disease or hypertension and Chronic Lung Disease/unvaccinated   I have spoken and communicated the following to the patient or parent/caregiver regarding COVID monoclonal antibody treatment:  1. FDA has authorized the emergency use for the treatment of mild to moderate COVID-19 in adults and pediatric patients with positive results of direct SARS-CoV-2 viral testing who are 98 years of age and older weighing at least 40 kg, and who are at high risk for progressing to severe COVID-19 and/or hospitalization.  2. The significant known and potential risks and benefits of COVID monoclonal antibody, and the extent to which such potential risks and benefits are unknown.  3. Information on available alternative treatments and the risks and benefits of those alternatives, including clinical trials.  4. Patients treated with COVID monoclonal antibody should continue to self-isolate and use infection control measures (e.g., wear mask, isolate, social distance, avoid sharing personal items, clean and disinfect "high touch" surfaces, and frequent handwashing) according to CDC guidelines.   5. The patient or parent/caregiver has the option to accept or refuse COVID monoclonal antibody treatment.  After  reviewing this information with the patient, the patient has agreed to receive one of the available covid 19 monoclonal antibodies and will be provided an appropriate fact sheet prior to infusion.  Nicolasa Ducking, NP 12/20/2020 2:25 PM

## 2020-12-21 ENCOUNTER — Ambulatory Visit (HOSPITAL_COMMUNITY): Payer: Medicare Other | Attending: Pulmonary Disease

## 2021-01-25 ENCOUNTER — Encounter: Payer: Self-pay | Admitting: *Deleted

## 2021-01-25 NOTE — Progress Notes (Signed)

## 2021-01-30 NOTE — Progress Notes (Signed)
Things That May Be Affecting Your Health:  Alcohol  Hearing loss  Pain    Depression  Home Safety  Sexual Health   Diabetes  Lack of physical activity  Stress   Difficulty with daily activities  Loneliness  Tiredness   Drug use  Medicines x Tobacco use   Falls  Motor Vehicle Safety x Weight   Food choices  Oral Health  Other    YOUR PERSONALIZED HEALTH PLAN : 1. Schedule your next subsequent Medicare Wellness visit in one year 2. Attend all of your regular appointments to address your medical issues 3. Complete the preventative screenings and services   Annual Wellness Visit   Medicare Covered Preventative Screenings and [place] Men and Women Who How Often Need? Date of Last Service Action  Abdominal Aortic Aneurysm Adults with AAA risk factors Once yes    Alcohol Misuse and Counseling All Adults Screening once a year if no alcohol misuse. Counseling up to 4 face to face sessions.     Bone Density Measurement  Adults at risk for osteoporosis Once every 2 yrs     Lipid Panel Z13.6 All adults without CV disease Once every 5 yrs yes 10/18/2009   Colorectal Cancer   Stool sample or  Colonoscopy All adults 12 and older   Once every year  Every 10 years  06/01/2013   Depression All Adults Once a year  Today   Diabetes Screening Blood glucose, post glucose load, or GTT Z13.1  All adults at risk  Pre-diabetics  Once per year  Twice per year yes    Diabetes  Self-Management Training All adults Diabetics 10 hrs first year; 2 hours subsequent years. Requires Copay     Glaucoma  Diabetics  Family history of glaucoma  African Americans 40 yrs +  Hispanic Americans 57 yrs + Annually - requires coppay     Hepatitis C Z72.89 or F19.20  High Risk for HCV  Born between 1945 and 1965  Annually  Once     HIV Z11.4 All adults based on risk  Annually btw ages 46 & 36 regardless of risk  Annually > 65 yrs if at increased risk     Lung Cancer  Screening Asymptomatic adults aged 67-77 with 30 pack yr history and current smoker OR quit within the last 15 yrs Annually Must have counseling and shared decision making documentation before first screen     Medical Nutrition Therapy Adults with   Diabetes  Renal disease  Kidney transplant within past 3 yrs 3 hours first year; 2 hours subsequent years     Obesity and Counseling All adults Screening once a year Counseling if BMI 30 or higher  Today   Tobacco Use Counseling Adults who use tobacco  Up to 8 visits in one year yes    Vaccines Z23  Hepatitis B  Influenza   Pneumonia  Adults   Once  Once every flu season  Two different vaccines separated by one year     Next Annual Wellness Visit People with Medicare Every year  Today     Services & Screenings Women Who How Often Need  Date of Last Service Action  Mammogram  Z12.31 Women over 25 One baseline ages 60-39. Annually ager 40 yrs+     Pap tests All women Annually if high risk. Every 2 yrs for normal risk women     Screening for cervical cancer with   Pap (Z01.419 nl or Z01.411abnl) &  HPV Z11.51 Women aged 55 to 36 Once every 5 yrs yes 10/24/2012   Screening pelvic and breast exams All women Annually if high risk. Every 2 yrs for normal risk women     Sexually Transmitted Diseases  Chlamydia  Gonorrhea  Syphilis All at risk adults Annually for non pregnant females at increased risk         [place] Men Who How Ofter Need  Date of Last Service Action  Prostate Cancer - DRE & PSA Men over 50 Annually.  DRE might require a copay.     Sexually Transmitted Diseases  Syphilis All at risk adults Annually for men at increased risk

## 2021-02-25 ENCOUNTER — Other Ambulatory Visit: Payer: Self-pay

## 2021-02-25 ENCOUNTER — Ambulatory Visit (HOSPITAL_COMMUNITY)
Admission: EM | Admit: 2021-02-25 | Discharge: 2021-02-25 | Disposition: A | Discharge status: Home / Self Care | Payer: Medicare HMO

## 2021-03-18 DIAGNOSIS — M25531 Pain in right wrist: Secondary | ICD-10-CM | POA: Diagnosis not present

## 2021-03-29 DIAGNOSIS — G5601 Carpal tunnel syndrome, right upper limb: Secondary | ICD-10-CM | POA: Diagnosis not present

## 2021-04-24 ENCOUNTER — Telehealth (HOSPITAL_COMMUNITY): Payer: Self-pay | Admitting: Licensed Clinical Social Worker

## 2021-04-29 ENCOUNTER — Telehealth (HOSPITAL_COMMUNITY): Payer: Self-pay | Admitting: Licensed Clinical Social Worker

## 2021-04-29 ENCOUNTER — Ambulatory Visit (HOSPITAL_COMMUNITY): Payer: Self-pay | Admitting: Licensed Clinical Social Worker

## 2021-06-18 ENCOUNTER — Ambulatory Visit (HOSPITAL_COMMUNITY): Payer: Medicare HMO

## 2021-06-27 DIAGNOSIS — J329 Chronic sinusitis, unspecified: Secondary | ICD-10-CM | POA: Diagnosis not present

## 2021-10-08 ENCOUNTER — Other Ambulatory Visit: Payer: Self-pay | Admitting: Surgery

## 2021-11-01 ENCOUNTER — Other Ambulatory Visit: Payer: Self-pay | Admitting: Surgery

## 2021-11-01 DIAGNOSIS — N6452 Nipple discharge: Secondary | ICD-10-CM

## 2021-11-13 ENCOUNTER — Ambulatory Visit (HOSPITAL_COMMUNITY)
Admission: EM | Admit: 2021-11-13 | Discharge: 2021-11-13 | Disposition: A | Discharge status: Home / Self Care | Payer: Medicare HMO | Attending: Family Medicine | Admitting: Family Medicine

## 2021-11-13 ENCOUNTER — Other Ambulatory Visit: Payer: Self-pay | Admitting: Family Medicine

## 2021-11-13 ENCOUNTER — Encounter (HOSPITAL_COMMUNITY): Payer: Self-pay | Admitting: Emergency Medicine

## 2021-11-13 ENCOUNTER — Other Ambulatory Visit: Payer: Self-pay

## 2021-11-13 DIAGNOSIS — R002 Palpitations: Secondary | ICD-10-CM | POA: Diagnosis not present

## 2021-11-13 MED ORDER — ATENOLOL 25 MG PO TABS: 25.0000 mg | ORAL_TABLET | Freq: Every day | ORAL | 0 refills | Status: DC

## 2021-11-13 NOTE — ED Triage Notes (Signed)
Notes that she had been out of atenolol for a year, she is looking for PCP

## 2021-11-13 NOTE — ED Provider Notes (Signed)
[place]    CSN: [id number] Arrival date & time: 11/13/21  3244      History   Chief Complaint Chief Complaint  Patient presents with   Chest Pain    HPI [name] is a 55 y.o. female.   Patient presenting today with 1 day history of short episode of palpitations, chest tightness that occurred last night after going up the stairs into her building.  She states the symptoms improved with sitting down and resting, had a small episode this morning again while she was bending over getting some things ready but again resolved with rest.  She denies shortness of breath, diaphoresis, nausea, vomiting, syncope, mental status changes, extremity numbness, weakness, tingling.  Has had runs of palpitations in the past previously worked up by cardiology with no significant findings, previously controlled on atenolol but has been out for quite some time now.  Requesting a refill on this medication.   Past Medical History:  Diagnosis Date   Anemia    COVID-19 virus infection 11/2020   Fibroids    Heart palpitations    History of    History of blood transfusion    with knee replacement   History of colon polyps    Hypertension    Hypertension    no current meds for htn   Knee joint injury    Morbid obesity ([place])    Pneumonia    remote history   Thyroid goiter    bilateral goiter and cyst follow up every 2 years Dr. Erik Obey   Tobacco abuse     Patient Active Problem List   Diagnosis Date Noted   Hypertension    Tobacco abuse    COVID-19 virus infection 11/2020   Non-traumatic rotator cuff tear, right 10/12/2019   Intermittent palpitations 11/17/2017   Menopause 06/23/2017   Hot flashes 09/23/2016   Healthcare maintenance 09/23/2016   S/P total knee arthroplasty, 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   Uterine fibroid 05/09/2013   Tobacco use disorder 04/12/2013   OA (osteoarthritis) 10/28/2011   Toxic multinodular goiter 09/17/2009   MORBID OBESITY 09/17/2009   Essential  hypertension 09/17/2009   Pain in limb 09/17/2009   ANEMIA, HX OF 09/17/2009    Past Surgical History:  Procedure Laterality Date   ANKLE SURGERY Right 2009   broken ankle    arthroscopic surgery to knee Bilateral    BREAST BIOPSY Left 03/24/2019   CESAREAN SECTION     COLONOSCOPY     HAND SURGERY Bilateral    JOINT REPLACEMENT Bilateral    knees   REPLACEMENT TOTAL KNEE Right 2012   SHOULDER OPEN ROTATOR CUFF REPAIR Right 10/12/2019   Procedure: Right shoulder rotator cuff repair with graft and anchors;  Surgeon: Latanya Maudlin, MD;  Location: [facility];  Service: Orthopedics;  Laterality: Right;  69min   TOTAL KNEE ARTHROPLASTY Left 01/03/2014   Procedure: LEFT TOTAL KNEE ARTHROPLASTY;  Surgeon: Tobi Bastos, MD;  Location: [facility];  Service: Orthopedics;  Laterality: Left;   TUBAL LIGATION      OB History     Gravida  4   Para  2   Term  2   Preterm  0   AB  2   Living  2      SAB  1   IAB  1   Ectopic  0   Multiple      Live Births  2            Home Medications  Prior to Admission medications   Medication Sig Start Date End Date Taking? Authorizing Provider  atenolol (TENORMIN) 25 MG tablet Take 1 tablet (25 mg total) by mouth daily. 11/13/21   Volney American, PA-C  benzonatate (TESSALON) 100 MG capsule Take 1 capsule by mouth every 8 (eight) hours for cough. 12/19/20   Vanessa Kick, MD  fluconazole (DIFLUCAN) 200 MG tablet Take once today. Take second pill at completion of antibiotics. 12/07/20   Zigmund Gottron, NP  metroNIDAZOLE (FLAGYL) 500 MG tablet Take 1 tablet (500 mg total) by mouth 2 (two) times daily. 12/12/20   Chase Picket, MD  terconazole (TERAZOL 3) 0.8 % vaginal cream Place 1 applicator vaginally at bedtime. 08/21/20   Orvan July, NP    Family History Family History  Problem Relation Age of Onset   Hypertension Mother    Hypertension Father    Cancer Maternal Grandmother        ? lung    Social  History Social History   Tobacco Use   Smoking status: Every Day    Packs/day: 0.20    Years: 20.00    Pack years: 4.00    Types: Cigarettes   Smokeless tobacco: Never   Tobacco comments:    1-2  cigs per day  Vaping Use   Vaping Use: Never used  Substance Use Topics   Alcohol use: Yes    Alcohol/week: 2.0 standard drinks    Types: 2 Standard drinks or equivalent per week    Comment: occasionally once per month   Drug use: No     Allergies   Tramadol, Ibuprofen, Metronidazole, and Penicillins   Review of Systems Review of Systems Per HPI  Physical Exam Triage Vital Signs ED Triage Vitals  Enc Vitals Group     BP 11/13/21 1050 (!) 148/93     Pulse Rate 11/13/21 1050 92     Resp 11/13/21 1050 16     Temp 11/13/21 1050 98.3 F (36.8 C)     Temp Source 11/13/21 1050 Oral     SpO2 11/13/21 1050 97 %     Weight --      Height --      Head Circumference --      Peak Flow --      Pain Score 11/13/21 1054 6     Pain Loc --      Pain Edu? --      Excl. in [place]? --    No data found.  Updated Vital Signs BP (!) 148/93   Pulse 92   Temp 98.3 F (36.8 C) (Oral)   Resp 16   LMP 06/07/2016   SpO2 97%   Visual Acuity Right Eye Distance:   Left Eye Distance:   Bilateral Distance:    Right Eye Near:   Left Eye Near:    Bilateral Near:     Physical Exam Vitals and nursing note reviewed.  Constitutional:      Appearance: Normal appearance. She is not ill-appearing.  HENT:     Head: Atraumatic.     Nose: Nose normal.     Mouth/Throat:     Mouth: Mucous membranes are moist.     Pharynx: Oropharynx is clear.  Eyes:     Extraocular Movements: Extraocular movements intact.     Conjunctiva/sclera: Conjunctivae normal.  Cardiovascular:     Rate and Rhythm: Normal rate and regular rhythm.     Heart sounds: Normal heart sounds.  Pulmonary:  Effort: Pulmonary effort is normal.     Breath sounds: Normal breath sounds. No wheezing or rales.  Musculoskeletal:         General: Normal range of motion.     Cervical back: Normal range of motion and neck supple.  Skin:    General: Skin is warm and dry.  Neurological:     Mental Status: She is alert and oriented to person, place, and time.     Motor: No weakness.     Gait: Gait normal.  Psychiatric:        Mood and Affect: Mood normal.        Thought Content: Thought content normal.        Judgment: Judgment normal.     UC Treatments / Results  Labs (all labs ordered are listed, but only abnormal results are displayed) Labs Reviewed - No data to display  EKG   Radiology No results found.  Procedures Procedures (including critical care time)  Medications Ordered in UC Medications - No data to display  Initial Impression / Assessment and Plan / UC Course  I have reviewed the triage vital signs and the nursing notes.  Pertinent labs & imaging results that were available during my care of the patient were reviewed by me and considered in my medical decision making (see chart for details).     EKG today showing normal sinus rhythm at 91 bpm, no acute ST or T wave changes.  Her vital signs are overall benign and reassuring today apart from very mild elevation in blood pressure.  Her exam is completely benign today and she is currently asymptomatic.  Discussed that we cannot fully rule out an acute heart attack in the setting, only in the emergency department and she declines wishing to go at this time.  She does have a history of these episodes, previously followed by cardiology on atenolol.  Will refill atenolol and await cardiology follow-up in the next few days.  Knows to go to the emergency department if her symptoms worsen at any time.  Final Clinical Impressions(s) / UC Diagnoses   Final diagnoses:  Palpitations   Discharge Instructions   None    ED Prescriptions     Medication Sig Dispense Auth. Provider   atenolol (TENORMIN) 25 MG tablet Take 1 tablet (25 mg total) by mouth  daily. 30 tablet Volney American, [place]      PDMP not reviewed this encounter.   Volney American, [place] 11/13/21 1249

## 2021-11-13 NOTE — ED Triage Notes (Signed)
EKG given to Merrie Roof, PA, made aware of PT complaint and vitals

## 2021-11-13 NOTE — Telephone Encounter (Signed)
Requested Prescriptions  Refused Prescriptions Disp Refills  . atenolol (TENORMIN) 25 MG tablet [Pharmacy Med Name: ATENOLOL 25MG  TABLETS] 90 tablet     Sig: TAKE 1 TABLET(25 MG) BY MOUTH DAILY     Cardiovascular:  Beta Blockers Failed - 11/13/2021  1:18 PM      Failed - Last BP in normal range    BP Readings from Last 1 Encounters:  11/13/21 (!) 148/93         Failed - Valid encounter within last 6 months    Recent Outpatient Visits          8 years ago Vaginal bleeding   [facility] at [facility], Fenton Malling, MD   9 years ago Abdominal pain   [facility] at [facility], [facility]      Future Appointments            In 3 weeks Camillia Herter, NP Primary Care at [place] in normal range    Pulse Readings from Last 1 Encounters:  11/13/21 92

## 2021-11-13 NOTE — ED Triage Notes (Signed)
PT reports episode of tachycardia last night, was short-lived. Reports she bent over to pick something up and experience tachycardia this AM, lasted for about 2 hours. She had some chest discomfot and left arm discomfort. Rate has now slowed, but has residual chest discomfort.

## 2021-11-22 ENCOUNTER — Other Ambulatory Visit: Payer: Self-pay

## 2021-11-22 ENCOUNTER — Ambulatory Visit (INDEPENDENT_AMBULATORY_CARE_PROVIDER_SITE_OTHER): Payer: Medicare HMO

## 2021-11-22 ENCOUNTER — Ambulatory Visit (INDEPENDENT_AMBULATORY_CARE_PROVIDER_SITE_OTHER): Payer: Medicare HMO | Admitting: Cardiovascular Disease

## 2021-11-22 ENCOUNTER — Encounter: Payer: Self-pay | Admitting: Cardiovascular Disease

## 2021-11-22 VITALS — BP 166/100 | HR 97 | Ht 66.0 in | Wt 247.4 lb

## 2021-11-22 DIAGNOSIS — R0602 Shortness of breath: Secondary | ICD-10-CM

## 2021-11-22 DIAGNOSIS — R002 Palpitations: Secondary | ICD-10-CM

## 2021-11-22 LAB — BASIC METABOLIC PANEL
BUN/Creatinine Ratio: 14 (ref 9–23)
BUN: 14 mg/dL (ref 6–24)
CO2: 24 mmol/L (ref 20–29)
Calcium: 9.7 mg/dL (ref 8.7–10.2)
Chloride: 102 mmol/L (ref 96–106)
Creatinine, Ser: 1.02 mg/dL — ABNORMAL HIGH (ref 0.57–1.00)
Glucose: 82 mg/dL (ref 70–99)
Potassium: 4.3 mmol/L (ref 3.5–5.2)
Sodium: 137 mmol/L (ref 134–144)
eGFR: 65 mL/min/{1.73_m2} (ref >59)

## 2021-11-22 LAB — TSH: TSH: 1.73 u[IU]/mL (ref 0.450–4.500)

## 2021-11-22 NOTE — Progress Notes (Unsigned)
Applied a 14 day Zio XT monitor to patient in the office ?

## 2021-11-22 NOTE — Patient Instructions (Addendum)
Medication Instructions:  No changes today *If you need a refill on your cardiac medications before your next appointment, please call your pharmacy*   Lab Work: Today: BMET, TSH  If you have labs (blood work) drawn today and your tests are completely normal, you will receive your results only by: [place] (if you have MyChart) OR A paper copy in the mail If you have any lab test that is abnormal or we need to change your treatment, we will call you to review the results.   Testing/Procedures: Your physician has requested that you have an echocardiogram. Echocardiography is a painless test that uses sound waves to create images of your heart. It provides your doctor with information about the size and shape of your heart and how well your heart's chambers and valves are working. This procedure takes approximately one hour. There are no restrictions for this procedure.  Zio XT 14 day monitor for palpitations   Follow-Up: At [facility], you and your health needs are our priority.  As part of our continuing mission to provide you with exceptional heart care, we have created designated Provider Care Teams.  These Care Teams include your primary Cardiologist (physician) and Advanced Practice Providers (APPs -  Physician Assistants and Nurse Practitioners) who all work together to provide you with the care you need, when you need it.  We recommend signing up for the patient portal called "MyChart".  Sign up information is provided on this After Visit Summary.  MyChart is used to connect with patients for Virtual Visits (Telemedicine).  Patients are able to view lab/test results, encounter notes, upcoming appointments, etc.  Non-urgent messages can be sent to your provider as well.   To learn more about what you can do with MyChart, go to [email or web address].    Your next appointment:   4-8 week(s)  The format for your next appointment:   In Person  Provider:   Dr.  Lauree Chandler - 10:40 am Jan 10, 2022   Other Instructions

## 2021-11-22 NOTE — Progress Notes (Signed)
Chief Complaint  Patient presents with   New Patient (Initial Visit)    palpitations   History of Present Illness: 55 yo female with history of anemia, HTN, obesity and tobacco abuse who is here today as a new patient for the evaluation of palpitations. She was seen in the [facility] 11/13/21 with palpitations and chest tightness that occurred after climbing stairs. She was restarted on atenolol but she has not started this yet. She had recurrent palpitations yesterday and this lasted for several seconds. She has had some dyspnea but no chest pain.   Primary Care Physician: Madalyn Rob, MD   Past Medical History:  Diagnosis Date   Anemia    COVID-19 virus infection 11/2020   Fibroids    Heart palpitations    History of    History of blood transfusion    with knee replacement   History of colon polyps    Hypertension    Hypertension    no current meds for htn   Knee joint injury    Morbid obesity ([place])    Pneumonia    remote history   Thyroid goiter    bilateral goiter and cyst follow up every 2 years Dr. Erik Obey   Tobacco abuse     Past Surgical History:  Procedure Laterality Date   ANKLE SURGERY Right 2009   broken ankle    arthroscopic surgery to knee Bilateral    BREAST BIOPSY Left 03/24/2019   CESAREAN SECTION     COLONOSCOPY     HAND SURGERY Bilateral    JOINT REPLACEMENT Bilateral    knees   REPLACEMENT TOTAL KNEE Right 2012   SHOULDER OPEN ROTATOR CUFF REPAIR Right 10/12/2019   Procedure: Right shoulder rotator cuff repair with graft and anchors;  Surgeon: Latanya Maudlin, MD;  Location: [facility];  Service: Orthopedics;  Laterality: Right;  53min   TOTAL KNEE ARTHROPLASTY Left 01/03/2014   Procedure: LEFT TOTAL KNEE ARTHROPLASTY;  Surgeon: Tobi Bastos, MD;  Location: [facility];  Service: Orthopedics;  Laterality: Left;   TUBAL LIGATION      Current Outpatient Medications  Medication Sig Dispense Refill   benzonatate (TESSALON) 100 MG capsule Take  1 capsule by mouth every 8 (eight) hours for cough. 21 capsule 0   fluconazole (DIFLUCAN) 200 MG tablet Take once today. Take second pill at completion of antibiotics. 2 tablet 0   metroNIDAZOLE (FLAGYL) 500 MG tablet Take 1 tablet (500 mg total) by mouth 2 (two) times daily. 14 tablet 0   terconazole (TERAZOL 3) 0.8 % vaginal cream Place 1 applicator vaginally at bedtime. 20 g 0   atenolol (TENORMIN) 25 MG tablet Take 1 tablet (25 mg total) by mouth daily. (Patient not taking: Reported on 11/22/2021) 30 tablet 0   No current facility-administered medications for this visit.    Allergies  Allergen Reactions   Tramadol Itching   Ibuprofen Nausea And Vomiting   Metronidazole Itching and Nausea And Vomiting   Penicillins Itching    Denies hives, or airway issues.     Social History   Socioeconomic History   Marital status: Single    Spouse name: Not on file   Number of children: 2   Years of education: Not on file   Highest education level: Not on file  Occupational History   Occupation: disabled    Employer: DISABLED   Tobacco Use   Smoking status: Every Day    Packs/day: 0.20    Years: 20.00  Pack years: 4.00    Types: Cigarettes   Smokeless tobacco: Never   Tobacco comments:    1-2  cigs per day  Vaping Use   Vaping Use: Never used  Substance and Sexual Activity   Alcohol use: Yes    Alcohol/week: 2.0 standard drinks    Types: 2 Standard drinks or equivalent per week    Comment: occasionally once per month   Drug use: No   Sexual activity: Yes    Birth control/protection: Surgical  Other Topics Concern   Not on file  Social History Narrative   ** Merged History Encounter **       Applying for disability, previously worked in [employer] Strain: Not on file  Food Insecurity: Not on file  Transportation Needs: Not on file  Physical Activity: Not on file  Stress: Not on file  Social Connections: Not on  file  Intimate Partner Violence: Not on file    Family History  Problem Relation Age of Onset   Hypertension Mother    Dementia Mother    Hypertension Father    Atrial fibrillation Father    Cancer Maternal Grandmother        ? lung    Review of Systems:  As stated in the HPI and otherwise negative.   BP (!) 166/100   Pulse 97   Ht 5\' 6"  (1.676 m)   Wt 247 lb 6.4 oz (112.2 kg)   LMP 06/07/2016   SpO2 99%   BMI 39.93 kg/m   Physical Examination: General: Well developed, well nourished, NAD  HEENT: OP clear, mucus membranes moist  SKIN: warm, dry. No rashes. Neuro: No focal deficits  Musculoskeletal: Muscle strength 5/5 all ext  Psychiatric: Mood and affect normal  Neck: No JVD, no carotid bruits, no thyromegaly, no lymphadenopathy.  Lungs:Clear bilaterally, no wheezes, rhonci, crackles Cardiovascular: Regular rate and rhythm. No murmurs, gallops or rubs. Abdomen:Soft. Bowel sounds present. Non-tender.  Extremities: No lower extremity edema. Pulses are 2 + in the bilateral DP/PT.  EKG:  EKG is not ordered today. The ekg ordered today demonstrates  EKG from 11/13/21 is reviewed today and shows sinus  Recent Labs: No results found for requested labs within last 8760 hours.   Lipid Panel    Component Value Date/Time   CHOL 113 10/18/2009 2100   TRIG 36 10/18/2009 2100   HDL 54 10/18/2009 2100   CHOLHDL 2.1 Ratio 10/18/2009 2100   VLDL 7 10/18/2009 2100   LDLCALC 52 10/18/2009 2100     Wt Readings from Last 3 Encounters:  11/22/21 247 lb 6.4 oz (112.2 kg)  10/12/19 240 lb (108.9 kg)  10/10/19 240 lb (108.9 kg)      Assessment and Plan:   1. Palpitations: Will arrange echo to assess LVEF and exclude structural heart disease. 14 day Zio monitor. BMET and TSH today.   2. Dyspnea: Likely multi-factorial with obesity, lung disease due to tobacco abuse. Echo to assess LVEF. No volume overload on exam  Current medicines are reviewed at length with the patient  today.  The patient does not have concerns regarding medicines.  The following changes have been made:  no change  Labs/ tests ordered today include:   Orders Placed This Encounter  Procedures   TSH   Basic metabolic panel   LONG TERM MONITOR (3-14 DAYS)   ECHOCARDIOGRAM COMPLETE    Disposition:   F/U with me or office APP 4-8  weeks.    Signed, Lauree Chandler, MD 11/22/2021 10:26 AM    [facility] [address], [place], [place]  [address] Phone: [phone]; Fax: [phone]

## 2021-11-26 ENCOUNTER — Ambulatory Visit (INDEPENDENT_AMBULATORY_CARE_PROVIDER_SITE_OTHER): Payer: Medicare HMO | Admitting: Internal Medicine

## 2021-11-26 ENCOUNTER — Encounter: Payer: Self-pay | Admitting: Internal Medicine

## 2021-11-26 ENCOUNTER — Other Ambulatory Visit: Payer: Self-pay

## 2021-11-26 ENCOUNTER — Encounter: Payer: Self-pay | Admitting: *Deleted

## 2021-11-26 ENCOUNTER — Other Ambulatory Visit (HOSPITAL_COMMUNITY)
Admission: RE | Admit: 2021-11-26 | Discharge: 2021-11-26 | Disposition: A | Discharge status: Home / Self Care | Payer: Medicare HMO | Source: Ambulatory Visit | Attending: Internal Medicine | Admitting: Internal Medicine

## 2021-11-26 VITALS — BP 153/102 | HR 75 | Temp 98.2°F | Resp 28 | Ht 66.0 in | Wt 242.0 lb

## 2021-11-26 DIAGNOSIS — Z113 Encounter for screening for infections with a predominantly sexual mode of transmission: Secondary | ICD-10-CM | POA: Diagnosis not present

## 2021-11-26 DIAGNOSIS — J069 Acute upper respiratory infection, unspecified: Secondary | ICD-10-CM | POA: Diagnosis not present

## 2021-11-26 DIAGNOSIS — J329 Chronic sinusitis, unspecified: Secondary | ICD-10-CM

## 2021-11-26 DIAGNOSIS — R300 Dysuria: Secondary | ICD-10-CM

## 2021-11-26 LAB — POCT URINALYSIS DIPSTICK
Bilirubin, UA: NEGATIVE
Blood, UA: NEGATIVE
Glucose, UA: NEGATIVE
Ketones, UA: NEGATIVE
Leukocytes, UA: NEGATIVE
Nitrite, UA: NEGATIVE
Protein, UA: NEGATIVE
Spec Grav, UA: 1.02 (ref 1.010–1.025)
Urobilinogen, UA: 0.2 E.U./dL
pH, UA: 7 (ref 5.0–8.0)

## 2021-11-26 MED ORDER — AMOXICILLIN-POT CLAVULANATE 875-125 MG PO TABS
1.0000 | ORAL_TABLET | Freq: Two times a day (BID) | ORAL | 0 refills | Status: AC
Start: 2021-11-26 — End: 2021-12-03

## 2021-11-26 NOTE — Patient Instructions (Signed)
Thank you for trusting me with your care. To recap, today we discussed the following:   1. Dysuria - POCT Urinalysis Dipstick (81002) - Normal  2. Upper respiratory tract infection, unspecified type  - COVID-19, Flu A+B and RSV - amoxicillin-clavulanate (AUGMENTIN) 875-125 MG tablet; Take 1 tablet by mouth 2 (two) times daily for 7 days.  Dispense: 14 tablet; Refill: 0  3. Routine screening for STI (sexually transmitted infection)  - Cervicovaginal ancillary only - Cytology (oral, anal, urethral) ancillary only - RPR - HIV antibody (with reflex)

## 2021-11-27 LAB — CYTOLOGY, (ORAL, ANAL, URETHRAL) ANCILLARY ONLY
Chlamydia: NEGATIVE
Comment: NEGATIVE
Comment: NEGATIVE
Comment: NORMAL
Neisseria Gonorrhea: NEGATIVE
Trichomonas: NEGATIVE

## 2021-11-27 LAB — CERVICOVAGINAL ANCILLARY ONLY
Bacterial Vaginitis (gardnerella): NEGATIVE
Candida Glabrata: NEGATIVE
Candida Vaginitis: NEGATIVE
Chlamydia: NEGATIVE
Comment: NEGATIVE
Comment: NEGATIVE
Comment: NEGATIVE
Comment: NEGATIVE
Comment: NEGATIVE
Comment: NORMAL
Neisseria Gonorrhea: NEGATIVE
Trichomonas: NEGATIVE

## 2021-11-27 LAB — COVID-19, FLU A+B AND RSV
Influenza A, NAA: NOT DETECTED
Influenza B, NAA: NOT DETECTED
RSV, NAA: NOT DETECTED
SARS-CoV-2, NAA: NOT DETECTED

## 2021-11-27 LAB — RPR: RPR Ser Ql: NONREACTIVE

## 2021-11-27 LAB — HIV ANTIBODY (ROUTINE TESTING W REFLEX): HIV Screen 4th Generation wRfx: NONREACTIVE

## 2021-11-28 DIAGNOSIS — R300 Dysuria: Secondary | ICD-10-CM | POA: Insufficient documentation

## 2021-11-28 DIAGNOSIS — J329 Chronic sinusitis, unspecified: Secondary | ICD-10-CM | POA: Insufficient documentation

## 2021-11-28 DIAGNOSIS — Z1211 Encounter for screening for malignant neoplasm of colon: Secondary | ICD-10-CM | POA: Insufficient documentation

## 2021-11-28 DIAGNOSIS — Z113 Encounter for screening for infections with a predominantly sexual mode of transmission: Secondary | ICD-10-CM | POA: Insufficient documentation
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
## 2021-11-28 NOTE — Progress Notes (Signed)
Internal Medicine Clinic Attending  Case discussed with Dr. Steen  At the time of the visit.  We reviewed the resident's history and exam and pertinent patient test results.  I agree with the assessment, diagnosis, and plan of care documented in the resident's note.  

## 2021-11-28 NOTE — Assessment & Plan Note (Signed)
Patient reports dysuria for almost a week. Urine dipstick neg nitrites and leukocytes, nl findings and do not expect urinary tract infection. New unprotected sexual partner recently. No vaginal discharge. Plan to test for STI.  Assessment/Plan: Dysuria - POCT Urinalysis Dipstick (81002)

## 2021-11-28 NOTE — Progress Notes (Signed)
   CC: dysuria, recurrent sinusitis, screening for STI  HPI:[name] is a 55 y.o. female who presents for evaluation of. Please see individual problem based A/P for details.  Past Medical History:  Diagnosis Date   Anemia    COVID-19 virus infection 11/2020   Fibroids    Heart palpitations    History of    History of blood transfusion    with knee replacement   History of colon polyps    Hypertension    Hypertension    no current meds for htn   Knee joint injury    Morbid obesity ([place])    Pneumonia    remote history   Thyroid goiter    bilateral goiter and cyst follow up every 2 years Dr. Erik Obey   Tobacco abuse    Review of Systems:   Review of Systems  Constitutional:  Positive for chills. Negative for fever.  HENT:  Positive for sinus pain and sore throat.   Eyes:  Negative for blurred vision, double vision and pain.  Respiratory:  Positive for cough and sputum production. Negative for shortness of breath.     Physical Exam: Vitals:   11/26/21 1432 11/26/21 1441  BP: (!) 171/115 (!) 153/102  Pulse: 72 75  Resp: (!) 28   Temp: 98.2 F (36.8 C)   TempSrc: Oral   SpO2: 100%   Weight: 242 lb (109.8 kg)   Height: 5\' 6"  (1.676 m)    General: Patient is in NAD. Talking in a clear voice. HEENT: lateral superior left eye edema mostly at 3 o'clock position, no pain on palpation, Conjunctiva nl , antiicteric sclerae, moist mucous membranes, no exudate or erythema Cardiovascular: Normal rate, regular rhythm.  No murmurs Pulmonary : Equal breath sounds, No wheezes, rales, or rhonchi  Assessment & Plan:   See Encounters Tab for problem based charting.  Patient discussed with Dr.  Saverio Danker

## 2021-11-28 NOTE — Assessment & Plan Note (Signed)
Unprotected sex with new partner approximately 3 weeks ago. Dysuria today, but no vaginal discharge. Urine dip stick neg nitrides and leukocytes.  Sore throat , erythematous , and having some chills. No fever. Cough is present.   Routine screening for STI (sexually transmitted infection) - Cervicovaginal ancillary only - Cytology (oral, anal, urethral) ancillary only - RPR - HIV antibody (with reflex)  Addendum: Test above - all negative for STI. Patient notified

## 2021-12-04 NOTE — Progress Notes (Signed)
Erroneous encounter

## 2021-12-06 ENCOUNTER — Inpatient Hospital Stay: Admission: RE | Admit: 2021-12-06 | Payer: Medicare HMO | Source: Ambulatory Visit

## 2021-12-09 ENCOUNTER — Encounter: Payer: Medicare HMO | Admitting: Family

## 2021-12-09 DIAGNOSIS — Z7689 Persons encountering health services in other specified circumstances: Secondary | ICD-10-CM

## 2021-12-10 DIAGNOSIS — R002 Palpitations: Secondary | ICD-10-CM | POA: Diagnosis not present

## 2021-12-10 DIAGNOSIS — R0602 Shortness of breath: Secondary | ICD-10-CM | POA: Diagnosis not present

## 2021-12-10 DIAGNOSIS — Z1152 Encounter for screening for COVID-19: Secondary | ICD-10-CM | POA: Diagnosis not present

## 2021-12-12 ENCOUNTER — Ambulatory Visit (HOSPITAL_COMMUNITY): Payer: Medicare HMO | Attending: Internal Medicine

## 2021-12-12 ENCOUNTER — Encounter (HOSPITAL_COMMUNITY): Payer: Self-pay | Admitting: Cardiovascular Disease

## 2021-12-13 ENCOUNTER — Other Ambulatory Visit: Payer: Self-pay

## 2021-12-13 MED ORDER — ATENOLOL 25 MG PO TABS: 25.0000 mg | ORAL_TABLET | Freq: Every day | ORAL | 3 refills | Status: DC

## 2022-01-01 ENCOUNTER — Ambulatory Visit (HOSPITAL_COMMUNITY): Payer: Medicare Other | Attending: Cardiology

## 2022-01-01 ENCOUNTER — Other Ambulatory Visit: Payer: Self-pay

## 2022-01-01 DIAGNOSIS — R002 Palpitations: Secondary | ICD-10-CM | POA: Insufficient documentation

## 2022-01-01 DIAGNOSIS — R0602 Shortness of breath: Secondary | ICD-10-CM

## 2022-01-01 LAB — ECHOCARDIOGRAM COMPLETE
Area-P 1/2: 3.91 cm2
MV M vel: 5.18 m/s
MV Peak grad: 107.3 mmHg
S' Lateral: 4.2 cm

## 2022-01-06 ENCOUNTER — Other Ambulatory Visit: Payer: Self-pay | Admitting: *Deleted

## 2022-01-06 DIAGNOSIS — I340 Nonrheumatic mitral (valve) insufficiency: Secondary | ICD-10-CM

## 2022-01-06 NOTE — Progress Notes (Signed)
Order placed for echo in one year.

## 2022-01-09 NOTE — Progress Notes (Signed)
Chief Complaint  Patient presents with   Follow-up    Palpitations    History of Present Illness: 56 yo female with history of anemia, HTN, obesity and tobacco abuse who is here today for follow up. I saw her as a new patient for the evaluation of palpitations in December 2022. She was seen in the [facility] 11/13/21 with palpitations and chest tightness that occurred after climbing stairs. She was restarted on atenolol but she had not started this yet when I saw her.  She had recurrent palpitations the day before her visit here that lasted for several seconds. She has had some dyspnea but no chest pain. Echo January 2023 with low normal LV systolic function, MPNT=61%. No regional wall motion abnormalities. Moderate mitral regurgitation. Cardiac monitor with sinus, 2 beat runs of VT and 9 runs of SVT, longest 16 seconds.   She is here today for follow up. The patient denies any chest pain, dyspnea, lower extremity edema, orthopnea, PND, dizziness, near syncope or syncope. She has been feeling her heart race some for several minutes. Overall feeling well.   Primary Care Physician: Madalyn Rob, MD   Past Medical History:  Diagnosis Date   Anemia    COVID-19 virus infection 11/2020   Fibroids    Heart palpitations    History of    History of blood transfusion    with knee replacement   History of colon polyps    Hypertension    Hypertension    no current meds for htn   Knee joint injury    Morbid obesity ([place])    Pneumonia    remote history   Thyroid goiter    bilateral goiter and cyst follow up every 2 years Dr. Erik Obey   Tobacco abuse     Past Surgical History:  Procedure Laterality Date   ANKLE SURGERY Right 2009   broken ankle    arthroscopic surgery to knee Bilateral    BREAST BIOPSY Left 03/24/2019   CESAREAN SECTION     COLONOSCOPY     HAND SURGERY Bilateral    JOINT REPLACEMENT Bilateral    knees   REPLACEMENT TOTAL KNEE Right 2012   SHOULDER OPEN  ROTATOR CUFF REPAIR Right 10/12/2019   Procedure: Right shoulder rotator cuff repair with graft and anchors;  Surgeon: Latanya Maudlin, MD;  Location: [facility];  Service: Orthopedics;  Laterality: Right;  79min   TOTAL KNEE ARTHROPLASTY Left 01/03/2014   Procedure: LEFT TOTAL KNEE ARTHROPLASTY;  Surgeon: Tobi Bastos, MD;  Location: [facility];  Service: Orthopedics;  Laterality: Left;   TUBAL LIGATION      Current Outpatient Medications  Medication Sig Dispense Refill   lisinopril (ZESTRIL) 5 MG tablet Take 1 tablet (5 mg total) by mouth daily. 90 tablet 3   metoprolol succinate (TOPROL XL) 25 MG 24 hr tablet Take 1 tablet (25 mg total) by mouth daily. May take one extra tablet as needed for heart racing 90 tablet 3   No current facility-administered medications for this visit.    Allergies  Allergen Reactions   Tramadol Itching   Ibuprofen Nausea And Vomiting   Metronidazole Itching and Nausea And Vomiting   Penicillins Itching    Denies hives, or airway issues.     Social History   Socioeconomic History   Marital status: Single    Spouse name: Not on file   Number of children: 2   Years of education: Not on file   Highest education  level: Not on file  Occupational History   Occupation: disabled    Employer: DISABLED   Tobacco Use   Smoking status: Every Day    Packs/day: 0.20    Years: 20.00    Pack years: 4.00    Types: Cigarettes   Smokeless tobacco: Never   Tobacco comments:    1-2  cigs per day  Vaping Use   Vaping Use: Never used  Substance and Sexual Activity   Alcohol use: Yes    Alcohol/week: 2.0 standard drinks    Types: 2 Standard drinks or equivalent per week    Comment: occasionally once per month   Drug use: No   Sexual activity: Yes    Birth control/protection: Surgical  Other Topics Concern   Not on file  Social History Narrative   ** Merged History Encounter **       Applying for disability, previously worked in [place] Strain: Not on file  Food Insecurity: Not on file  Transportation Needs: Not on file  Physical Activity: Not on file  Stress: Not on file  Social Connections: Not on file  Intimate Partner Violence: Not on file    Family History  Problem Relation Age of Onset   Hypertension Mother    Dementia Mother    Hypertension Father    Atrial fibrillation Father    Cancer Maternal Grandmother        ? lung    Review of Systems:  As stated in the HPI and otherwise negative.   BP (!) 158/98    Pulse 70    Ht 5\' 6"  (1.676 m)    Wt 251 lb 12.8 oz (114.2 kg)    LMP 06/07/2016    SpO2 99%    BMI 40.64 kg/m   Physical Examination:  General: Well developed, well nourished, NAD  HEENT: OP clear, mucus membranes moist  SKIN: warm, dry. No rashes. Neuro: No focal deficits  Musculoskeletal: Muscle strength 5/5 all ext  Psychiatric: Mood and affect normal  Neck: No JVD, no carotid bruits, no thyromegaly, no lymphadenopathy.  Lungs:Clear bilaterally, no wheezes, rhonci, crackles Cardiovascular: Regular rate and rhythm. systolic murmur.  Abdomen:Soft. Bowel sounds present. Non-tender.  Extremities: No lower extremity edema. Pulses are 2 + in the bilateral DP/PT.  EKG:  EKG is not ordered today. The ekg ordered today demonstrates   Echo 01/01/22:  1. Left ventricular ejection fraction, by estimation, is 45 to 50%. The  left ventricle has mildly decreased function. The left ventricle has no  regional wall motion abnormalities. Left ventricular diastolic parameters  are consistent with Grade II  diastolic dysfunction (pseudonormalization).   2. Right ventricular systolic function is normal. The right ventricular  size is normal.   3. Left atrial size was moderately dilated.   4. Right atrial size was mildly dilated.   5. The mitral valve is normal in structure. Moderate mitral valve  regurgitation. No evidence of mitral stenosis.   6. The aortic  valve is normal in structure. Aortic valve regurgitation is  not visualized. No aortic stenosis is present.   7. The inferior vena cava is normal in size with greater than 50%  respiratory variability, suggesting right atrial pressure of 3 mmHg.   Recent Labs: 11/22/2021: BUN 14; Creatinine, Ser 1.02; Potassium 4.3; Sodium 137; TSH 1.730   Lipid Panel    Component Value Date/Time   CHOL 113 10/18/2009 2100   TRIG 36 10/18/2009  2100   HDL 54 10/18/2009 2100   CHOLHDL 2.1 Ratio 10/18/2009 2100   VLDL 7 10/18/2009 2100   LDLCALC 52 10/18/2009 2100     Wt Readings from Last 3 Encounters:  01/10/22 251 lb 12.8 oz (114.2 kg)  11/26/21 242 lb (109.8 kg)  11/22/21 247 lb 6.4 oz (112.2 kg)      Assessment and Plan:   1. Palpitations/SVT: Her palpitations are likely due to SVT. Will stop atenolol and start Toprol 25 mg daily. She will take extra 25 mg as needed.   2. Mitral regurgitation: Moderate by echo January 2023. Repeat echo January 2024.   3. Non-ischemic cardiomyopathy: Low normal LV function with LVEF=45-50% January 2023. Will start Toprol 25 mg daily and add Lisinopril 5 mg daily.   Current medicines are reviewed at length with the patient today.  The patient does not have concerns regarding medicines.  The following changes have been made:  no change  Labs/ tests ordered today include:   Orders Placed This Encounter  Procedures   ECHOCARDIOGRAM COMPLETE    Disposition:   F/U with me one year  Signed, Lauree Chandler, MD 01/10/2022 10:58 AM    [facility] [address], [place], [place]  [address] Phone: [phone]; Fax: [phone]

## 2022-01-10 ENCOUNTER — Encounter: Payer: Self-pay | Admitting: Cardiovascular Disease

## 2022-01-10 ENCOUNTER — Other Ambulatory Visit: Payer: Self-pay

## 2022-01-10 ENCOUNTER — Ambulatory Visit (INDEPENDENT_AMBULATORY_CARE_PROVIDER_SITE_OTHER): Payer: Medicare Other | Admitting: Cardiovascular Disease

## 2022-01-10 VITALS — BP 158/98 | HR 70 | Ht 66.0 in | Wt 251.8 lb

## 2022-01-10 DIAGNOSIS — I340 Nonrheumatic mitral (valve) insufficiency: Secondary | ICD-10-CM

## 2022-01-10 DIAGNOSIS — I428 Other cardiomyopathies: Secondary | ICD-10-CM | POA: Diagnosis not present

## 2022-01-10 DIAGNOSIS — I471 Supraventricular tachycardia: Secondary | ICD-10-CM | POA: Diagnosis not present

## 2022-01-10 DIAGNOSIS — R002 Palpitations: Secondary | ICD-10-CM

## 2022-01-10 MED ORDER — LISINOPRIL 5 MG PO TABS: 5.0000 mg | ORAL_TABLET | Freq: Every day | ORAL | 3 refills | Status: DC

## 2022-01-10 MED ORDER — METOPROLOL SUCCINATE ER 25 MG PO TB24: 25.0000 mg | ORAL_TABLET | Freq: Every day | ORAL | 3 refills | Status: DC

## 2022-01-10 NOTE — Patient Instructions (Addendum)
Medication Instructions:  Your physician has recommended you make the following change in your medication:  1.) stop atenolol 2.) start metoprolol succinate (Toprol XL) 25 mg - take one tablet daily and may take one extra tablet as needed for heart racing 3.) start lisinopril 5 mg - one tablet daily   *If you need a refill on your cardiac medications before your next appointment, please call your pharmacy*   Lab Work: none If you have labs (blood work) drawn today and your tests are completely normal, you will receive your results only by: [place] (if you have MyChart) OR A paper copy in the mail If you have any lab test that is abnormal or we need to change your treatment, we will call you to review the results.   Testing/Procedures: in Jan 2024 - before next appointment with Dr. Angelena Form Your physician has requested that you have an echocardiogram. Echocardiography is a painless test that uses sound waves to create images of your heart. It provides your doctor with information about the size and shape of your heart and how well your hearts chambers and valves are working. This procedure takes approximately one hour. There are no restrictions for this procedure.   Follow-Up: At [facility], you and your health needs are our priority.  As part of our continuing mission to provide you with exceptional heart care, we have created designated Provider Care Teams.  These Care Teams include your primary Cardiologist (physician) and Advanced Practice Providers (APPs -  Physician Assistants and Nurse Practitioners) who all work together to provide you with the care you need, when you need it.  We recommend signing up for the patient portal called "MyChart".  Sign up information is provided on this After Visit Summary.  MyChart is used to connect with patients for Virtual Visits (Telemedicine).  Patients are able to view lab/test results, encounter notes, upcoming appointments, etc.   Non-urgent messages can be sent to your provider as well.   To learn more about what you can do with MyChart, go to [email or web address].    Your next appointment:   12 month(s)  --echo prior  The format for your next appointment:   In Person  Provider:   Lauree Chandler, MD     Other Instructions

## 2022-02-10 ENCOUNTER — Encounter: Payer: Self-pay | Admitting: Internal Medicine

## 2022-02-26 ENCOUNTER — Telehealth: Payer: Self-pay | Admitting: Cardiovascular Disease

## 2022-02-26 NOTE — Telephone Encounter (Signed)
Patient is calling stating she dropped of a paper that needs to be filled out for her job yesterday exempting her from getting the Covid shot due to her heart condition. She states the man at the front desk advised her that Dr. Angelena Form is out of the office, but stated he would try to have another Provider fill it out for her due to her needing it this morning. She is wanting to confirm the paperwork is ready for pick up so she won't lose her job. Please advise.  ?

## 2022-02-26 NOTE — Telephone Encounter (Signed)
Medical exemption form is in Dr [employer] box.  I spoke with patient and let her know Dr Angelena Form would review form when he is back in the office.  We would then let her know if he was able to complete this form.  Patient reports she does not need form completed today ?

## 2022-02-27 ENCOUNTER — Encounter: Payer: Self-pay | Admitting: *Deleted

## 2022-03-06 NOTE — Telephone Encounter (Signed)
Papers have been filled out and signed by Dr. Angelena Form and are available at the front desk for patient to pick up. Lvm informing patient that she can come to the office and pick up paper.  ?

## 2022-04-08 ENCOUNTER — Encounter: Payer: Self-pay | Admitting: Internal Medicine

## 2022-04-08 ENCOUNTER — Encounter: Payer: Medicare Other | Admitting: Internal Medicine

## 2022-05-30 ENCOUNTER — Encounter: Payer: Medicare Other | Admitting: Student

## 2022-05-30 ENCOUNTER — Telehealth: Payer: Self-pay | Admitting: *Deleted

## 2022-05-30 ENCOUNTER — Telehealth: Payer: Medicare Other | Admitting: Student

## 2022-05-30 ENCOUNTER — Encounter: Payer: Self-pay | Admitting: Student

## 2022-05-30 ENCOUNTER — Ambulatory Visit (INDEPENDENT_AMBULATORY_CARE_PROVIDER_SITE_OTHER): Payer: Medicare Other | Admitting: Student

## 2022-05-30 VITALS — BP 150/96 | HR 77 | Temp 97.8°F | Ht 66.0 in | Wt 254.0 lb

## 2022-05-30 DIAGNOSIS — G8929 Other chronic pain: Secondary | ICD-10-CM | POA: Diagnosis not present

## 2022-05-30 DIAGNOSIS — R519 Headache, unspecified: Secondary | ICD-10-CM | POA: Diagnosis not present

## 2022-05-30 NOTE — Patient Instructions (Addendum)
For your headache we will get labs and imaging. We will also see if aleve helps your symptoms. Please do not take these every day as this can cause you to hold on to fluid and potentially damage kidneys.

## 2022-05-30 NOTE — Progress Notes (Signed)
   CC: acute to subacute worsening of chronic headaches   HPI:  [name] is a 56 y.o. F with PMH per below who presents for acute to subacute worsening of her chronic headaches and a single episode of bright red blood on her toilet paper. Please see problem based charting under encounters tab for further details.    Past Medical History:  Diagnosis Date   Anemia    COVID-19 virus infection 11/2020   Fibroids    Heart palpitations    History of    History of blood transfusion    with knee replacement   History of colon polyps    Hypertension    Hypertension    no current meds for htn   Knee joint injury    Morbid obesity ([place])    Pneumonia    remote history   Thyroid goiter    bilateral goiter and cyst follow up every 2 years Dr. Erik Obey   Tobacco abuse    Review of Systems:  Please see problem based charting under encounters tab for further details.    Physical Exam:  Vitals:   05/30/22 1102  BP: (!) 150/96  Pulse: 77  Temp: 97.8 F (36.6 C)  TempSrc: Oral  SpO2: 98%  Weight: 254 lb (115.2 kg)  Height: '5\' 6"'$  (1.676 m)    Constitutional: Well-developed, well-nourished, and in no distress.  HENT:  Head: Normocephalic and atraumatic.  Eyes: EOM are normal.  Neck: Normal range of motion.  Cardiovascular: Normal rate, regular rhythm, intact distal pulses. No gallop and no friction rub.  No murmur heard. No lower extremity edema  Pulmonary: Non labored breathing on room air, no wheezing or rales  Abdominal: Soft. Normal bowel sounds. Non distended and non tender Musculoskeletal: Normal range of motion.        General: No tenderness or edema.  Neurological: Alert and oriented to person, place, and time. CN 2-12, 5/5 BUE, 4/5 LLE with hip flexion, 5/5 RLE, no changes in sensation  Skin: Skin is warm and dry.    Assessment & Plan:   See Encounters Tab for problem based charting.  Patient discussed with Dr. Evette Doffing

## 2022-05-30 NOTE — Telephone Encounter (Signed)
Call from patient states is having abdominal pain and headaches x 2 weeks with blurry vision.   No fever.  Has Nausea but has no vomiting .  Had a blood tinged  BM this morning x 1.  Feels bad.  Given an appointment for 10:30 AM for assessment of her symptoms.
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
## 2022-05-31 LAB — C-REACTIVE PROTEIN: CRP: 3 mg/L (ref 0–10)

## 2022-05-31 LAB — SEDIMENTATION RATE: Sed Rate: 30 mm/hr (ref 0–40)

## 2022-06-02 ENCOUNTER — Encounter: Payer: Medicare Other | Admitting: Student

## 2022-06-03 NOTE — Progress Notes (Signed)
Internal Medicine Clinic Attending  Case discussed with Dr. Carter  At the time of the visit.  We reviewed the resident's history and exam and pertinent patient test results.  I agree with the assessment, diagnosis, and plan of care documented in the resident's note.  

## 2022-06-13 ENCOUNTER — Ambulatory Visit (HOSPITAL_COMMUNITY): Payer: Medicare Other

## 2022-06-16 ENCOUNTER — Other Ambulatory Visit: Payer: Self-pay

## 2022-06-16 DIAGNOSIS — R070 Pain in throat: Secondary | ICD-10-CM | POA: Diagnosis not present

## 2022-06-16 DIAGNOSIS — E049 Nontoxic goiter, unspecified: Secondary | ICD-10-CM | POA: Diagnosis not present

## 2022-06-16 DIAGNOSIS — F17210 Nicotine dependence, cigarettes, uncomplicated: Secondary | ICD-10-CM | POA: Diagnosis not present

## 2022-06-17 ENCOUNTER — Other Ambulatory Visit: Payer: Self-pay | Admitting: Otolaryngology

## 2022-06-17 DIAGNOSIS — E049 Nontoxic goiter, unspecified: Secondary | ICD-10-CM

## 2022-06-17 DIAGNOSIS — R070 Pain in throat: Secondary | ICD-10-CM

## 2022-06-18 ENCOUNTER — Ambulatory Visit
Admission: RE | Admit: 2022-06-18 | Discharge: 2022-06-18 | Disposition: A | Discharge status: Home / Self Care | Payer: Medicare Other | Source: Ambulatory Visit | Attending: Otolaryngology | Admitting: Otolaryngology

## 2022-06-18 DIAGNOSIS — E042 Nontoxic multinodular goiter: Secondary | ICD-10-CM | POA: Diagnosis not present

## 2022-06-18 DIAGNOSIS — R070 Pain in throat: Secondary | ICD-10-CM

## 2022-06-18 DIAGNOSIS — E049 Nontoxic goiter, unspecified: Secondary | ICD-10-CM

## 2022-06-23 ENCOUNTER — Ambulatory Visit (HOSPITAL_COMMUNITY): Payer: Medicare Other

## 2022-07-08 ENCOUNTER — Ambulatory Visit (HOSPITAL_COMMUNITY)
Admission: RE | Admit: 2022-07-08 | Discharge: 2022-07-08 | Disposition: A | Discharge status: Home / Self Care | Payer: Medicare Other | Source: Ambulatory Visit | Attending: Student in an Organized Health Care Education/Training Program | Admitting: Student in an Organized Health Care Education/Training Program

## 2022-07-08 DIAGNOSIS — G8929 Other chronic pain: Secondary | ICD-10-CM | POA: Diagnosis not present

## 2022-07-08 DIAGNOSIS — R519 Headache, unspecified: Secondary | ICD-10-CM | POA: Insufficient documentation

## 2022-07-08 MED ORDER — GADOBUTROL 1 MMOL/ML IV SOLN
10.0000 mL | Freq: Once | INTRAVENOUS | Status: AC | PRN
  Administered 2022-07-08: 10 mL via INTRAVENOUS

## 2022-07-11 ENCOUNTER — Telehealth: Payer: Self-pay

## 2022-07-11 NOTE — Telephone Encounter (Signed)
Requesting MRI results, please call pt back.  

## 2022-07-15 ENCOUNTER — Other Ambulatory Visit: Payer: Self-pay | Admitting: Student

## 2022-07-15 DIAGNOSIS — R519 Headache, unspecified: Secondary | ICD-10-CM

## 2022-07-24 ENCOUNTER — Emergency Department (HOSPITAL_COMMUNITY)
Admission: EM | Admit: 2022-07-24 | Discharge: 2022-07-24 | Disposition: A | Discharge status: Home / Self Care | Payer: Medicare Other | Attending: Emergency Medicine | Admitting: Emergency Medicine

## 2022-07-24 ENCOUNTER — Other Ambulatory Visit: Payer: Self-pay

## 2022-07-24 ENCOUNTER — Emergency Department (HOSPITAL_COMMUNITY): Payer: Medicare Other

## 2022-07-24 ENCOUNTER — Encounter (HOSPITAL_COMMUNITY): Payer: Self-pay

## 2022-07-24 DIAGNOSIS — F17200 Nicotine dependence, unspecified, uncomplicated: Secondary | ICD-10-CM | POA: Insufficient documentation

## 2022-07-24 DIAGNOSIS — R42 Dizziness and giddiness: Secondary | ICD-10-CM | POA: Insufficient documentation

## 2022-07-24 DIAGNOSIS — H6691 Otitis media, unspecified, right ear: Secondary | ICD-10-CM | POA: Diagnosis not present

## 2022-07-24 DIAGNOSIS — H748X3 Other specified disorders of middle ear and mastoid, bilateral: Secondary | ICD-10-CM | POA: Diagnosis not present

## 2022-07-24 DIAGNOSIS — R112 Nausea with vomiting, unspecified: Secondary | ICD-10-CM | POA: Diagnosis not present

## 2022-07-24 DIAGNOSIS — I10 Essential (primary) hypertension: Secondary | ICD-10-CM | POA: Diagnosis not present

## 2022-07-24 DIAGNOSIS — Z79899 Other long term (current) drug therapy: Secondary | ICD-10-CM | POA: Insufficient documentation

## 2022-07-24 DIAGNOSIS — R519 Headache, unspecified: Secondary | ICD-10-CM | POA: Insufficient documentation

## 2022-07-24 DIAGNOSIS — R6889 Other general symptoms and signs: Secondary | ICD-10-CM | POA: Diagnosis not present

## 2022-07-24 DIAGNOSIS — Z743 Need for continuous supervision: Secondary | ICD-10-CM | POA: Diagnosis not present

## 2022-07-24 DIAGNOSIS — R404 Transient alteration of awareness: Secondary | ICD-10-CM | POA: Diagnosis not present

## 2022-07-24 DIAGNOSIS — R109 Unspecified abdominal pain: Secondary | ICD-10-CM | POA: Diagnosis not present

## 2022-07-24 LAB — CBC WITH DIFFERENTIAL/PLATELET
Abs Immature Granulocytes: 0 10*3/uL (ref 0.00–0.07)
Basophils Absolute: 0 10*3/uL (ref 0.0–0.1)
Basophils Relative: 1 %
Eosinophils Absolute: 0.1 10*3/uL (ref 0.0–0.5)
Eosinophils Relative: 2 %
HCT: 36.1 % (ref 36.0–46.0)
Hemoglobin: 11.8 g/dL — ABNORMAL LOW (ref 12.0–15.0)
Immature Granulocytes: 0 %
Lymphocytes Relative: 15 %
Lymphs Abs: 0.7 10*3/uL (ref 0.7–4.0)
MCH: 30.3 pg (ref 26.0–34.0)
MCHC: 32.7 g/dL (ref 30.0–36.0)
MCV: 92.6 fL (ref 80.0–100.0)
Monocytes Absolute: 0.3 10*3/uL (ref 0.1–1.0)
Monocytes Relative: 6 %
Neutro Abs: 3.7 10*3/uL (ref 1.7–7.7)
Neutrophils Relative %: 76 %
Platelets: 213 10*3/uL (ref 150–400)
RBC: 3.9 MIL/uL (ref 3.87–5.11)
RDW: 16.8 % — ABNORMAL HIGH (ref 11.5–15.5)
WBC: 4.9 10*3/uL (ref 4.0–10.5)
nRBC: 0 % (ref 0.0–0.2)

## 2022-07-24 LAB — COMPREHENSIVE METABOLIC PANEL
ALT: 14 U/L (ref 0–44)
AST: 22 U/L (ref 15–41)
Albumin: 3.9 g/dL (ref 3.5–5.0)
Alkaline Phosphatase: 75 U/L (ref 38–126)
Anion gap: 8 (ref 5–15)
BUN: 15 mg/dL (ref 6–20)
CO2: 24 mmol/L (ref 22–32)
Calcium: 9.4 mg/dL (ref 8.9–10.3)
Chloride: 106 mmol/L (ref 98–111)
Creatinine, Ser: 1.13 mg/dL — ABNORMAL HIGH (ref 0.44–1.00)
GFR, Estimated: 57 mL/min — ABNORMAL LOW (ref >60)
Glucose, Bld: 106 mg/dL — ABNORMAL HIGH (ref 70–99)
Potassium: 4.1 mmol/L (ref 3.5–5.1)
Sodium: 138 mmol/L (ref 135–145)
Total Bilirubin: 0.7 mg/dL (ref 0.3–1.2)
Total Protein: 7.3 g/dL (ref 6.5–8.1)

## 2022-07-24 LAB — URINALYSIS, ROUTINE W REFLEX MICROSCOPIC
Bilirubin Urine: NEGATIVE
Glucose, UA: NEGATIVE mg/dL
Hgb urine dipstick: NEGATIVE
Ketones, ur: 5 mg/dL — AB
Leukocytes,Ua: NEGATIVE
Nitrite: NEGATIVE
Protein, ur: NEGATIVE mg/dL
Specific Gravity, Urine: 1.009 (ref 1.005–1.030)
pH: 7 (ref 5.0–8.0)

## 2022-07-24 LAB — TROPONIN I (HIGH SENSITIVITY)
Troponin I (High Sensitivity): 7 ng/L (ref <18)
Troponin I (High Sensitivity): 6 ng/L (ref <18)

## 2022-07-24 MED ORDER — SCOPOLAMINE 1 MG/3DAYS TD PT72
1.0000 | MEDICATED_PATCH | TRANSDERMAL | Status: DC
  Administered 2022-07-24: 1.5 mg via TRANSDERMAL
  Filled 2022-07-24 (×2): qty 1

## 2022-07-24 MED ORDER — ONDANSETRON 4 MG PO TBDP
4.0000 mg | ORAL_TABLET | Freq: Once | ORAL | Status: AC
  Administered 2022-07-24: 4 mg via ORAL
  Filled 2022-07-24: qty 1

## 2022-07-24 MED ORDER — MECLIZINE HCL 25 MG PO TABS: 25.0000 mg | ORAL_TABLET | Freq: Three times a day (TID) | ORAL | 0 refills | Status: DC | PRN

## 2022-07-24 MED ORDER — MECLIZINE HCL 25 MG PO TABS
25.0000 mg | ORAL_TABLET | Freq: Once | ORAL | Status: AC
  Administered 2022-07-24: 25 mg via ORAL
  Filled 2022-07-24: qty 1

## 2022-07-24 MED ORDER — METOCLOPRAMIDE HCL 5 MG/ML IJ SOLN
10.0000 mg | Freq: Once | INTRAMUSCULAR | Status: AC
  Administered 2022-07-24: 10 mg via INTRAVENOUS
  Filled 2022-07-24: qty 2

## 2022-07-24 MED ORDER — SODIUM CHLORIDE 0.9 % IV BOLUS
500.0000 mL | Freq: Once | INTRAVENOUS | Status: AC
  Administered 2022-07-24: 500 mL via INTRAVENOUS

## 2022-07-24 MED ORDER — SODIUM CHLORIDE 0.9 % IV BOLUS
500.0000 mL | Freq: Once | INTRAVENOUS | Status: AC
Start: 2022-07-24 — End: 2022-07-24
  Administered 2022-07-24: 500 mL via INTRAVENOUS

## 2022-07-24 MED ORDER — ACETAMINOPHEN 325 MG PO TABS
650.0000 mg | ORAL_TABLET | Freq: Once | ORAL | Status: AC
  Administered 2022-07-24: 650 mg via ORAL
  Filled 2022-07-24: qty 2

## 2022-07-24 MED ORDER — CEFDINIR 300 MG PO CAPS: 300.0000 mg | ORAL_CAPSULE | Freq: Two times a day (BID) | ORAL | 0 refills | Status: AC

## 2022-07-24 NOTE — ED Provider Triage Note (Signed)
  Emergency Medicine Provider Triage Evaluation Note  MRN:  [id number]  Arrival date & time: 07/24/22    Medically screening exam initiated at 12:49 AM.   CC:   Dizziness   HPI:  [name] is a 56 y.o. year-old female presents to the ED with chief complaint of dizziness.  She describes this as a room spinning sensation.  Onset earlier this afternoon around 5 PM.  She reports associated vomiting.  She states symptoms are worsened with head movement.  They better when she closes her eyes.  History provided by patient. ROS:  -As included in HPI PE:  There were no vitals filed for this visit.  Non-toxic appearing No respiratory distress Horizontal nystagmus Left lower quadrant tenderness MDM:  Based on signs and symptoms, vertigo is highest on my differential. I've ordered labs and meds in triage to expedite lab/diagnostic workup.  Patient was informed that the remainder of the evaluation will be completed by another provider, this initial triage assessment does not replace that evaluation, and the importance of remaining in the ED until their evaluation is complete.    Montine Circle, PA-C 07/24/22 [phone]

## 2022-07-24 NOTE — ED Provider Notes (Signed)
[facility] EMERGENCY DEPARTMENT Provider Note   CSN: [id number] Arrival date & time: 07/24/22  0035     History  Chief Complaint  Patient presents with   Dizziness    [name] is a 56 y.o. female.   Dizziness   56 year old female presents emergency department with complaints of feelings of the room spinning, headache since last night around 5 PM.  She notes similar symptoms in the past but they usually go away in a matter of minutes.  Spinning-like sensation is described as the room spinning around her.  She had some associated nausea and vomiting late last night.  She notes persistent nausea but has not vomited since given antiemetic here in the emergency department.  Symptoms are worsened with movement of the head.  She notes headache similar to headache she has had in the past that begins left-sided occipitally and radiates to the front of her forehead on affected side.  She does some sensitivity to light but no sensitivity to sound.  She has some gait abnormality but has not tried to ambulate since being in the emergency department.  She reported just prior to an episode of vomiting she had a sharp pain radiate up her right jaw but has not had the pain since.  Denies chest pain, shortness of breath, fever, chills, night sweats, abdominal pain, facial droop, slurred speech, changes in vision, weakness, sensory deficits  Past medical history significant for anemia, hypertension, morbid obesity, tobacco abuse, recurrent sinusitis  Home Medications Prior to Admission medications   Medication Sig Start Date End Date Taking? Authorizing Provider  lisinopril (ZESTRIL) 5 MG tablet Take 1 tablet (5 mg total) by mouth daily. 01/10/22   Burnell Blanks, MD  metoprolol succinate (TOPROL XL) 25 MG 24 hr tablet Take 1 tablet (25 mg total) by mouth daily. May take one extra tablet as needed for heart racing 01/10/22   Burnell Blanks, MD      Allergies     Tramadol, Ibuprofen, Metronidazole, and Penicillins    Review of Systems   Review of Systems  Neurological:  Positive for dizziness.    Physical Exam Updated Vital Signs BP 92/76   Pulse 85   Temp 98.5 F (36.9 C)   Resp 14   Ht '5\' 6"'$  (1.676 m)   Wt 108.9 kg   LMP 06/07/2016   SpO2 100%   BMI 38.74 kg/m  Physical Exam Vitals and nursing note reviewed.  Constitutional:      General: She is not in acute distress.    Appearance: She is well-developed. She is obese. She is not ill-appearing.  HENT:     Head: Normocephalic and atraumatic.     Right Ear: A middle ear effusion is present.     Left Ear: A middle ear effusion is present.     Ears:     Comments: Bilateral external auditory canal slightly erythematous in appearance with right more so than left.  Right TM with purulent fluid just behind.  Mixed series and purulent fluid behind left TM.  No obvious perforation of TM and no foreign body noted in external auditory canal. Eyes:     General: No visual field deficit.    Extraocular Movements:     Right eye: Nystagmus present.     Left eye: Nystagmus present.     Conjunctiva/sclera: Conjunctivae normal.     Right eye: Right conjunctiva is not injected. No chemosis, exudate or hemorrhage.    Left  eye: Left conjunctiva is not injected. No chemosis, exudate or hemorrhage.    Comments: Horizontal nystagmus to the right noted bilaterally.  Hints exam within normal range.  Cardiovascular:     Rate and Rhythm: Normal rate and regular rhythm.     Heart sounds: No murmur heard. Pulmonary:     Effort: Pulmonary effort is normal. No respiratory distress.     Breath sounds: Normal breath sounds.  Abdominal:     Palpations: Abdomen is soft.     Tenderness: There is no abdominal tenderness.  Musculoskeletal:        General: No swelling.     Cervical back: Neck supple.  Skin:    General: Skin is warm and dry.     Capillary Refill: Capillary refill takes less than 2 seconds.   Neurological:     Mental Status: She is alert.     Cranial Nerves: No dysarthria or facial asymmetry.     Motor: No weakness or pronator drift.     Coordination: Finger-Nose-Finger Test and Heel to [place] Test normal.     Comments: Cranial nerves III through XII grossly intact.  Muscle strength 5 out of 5 for upper and lower extremities.  No sensory deficits along major nerve distributions of upper or lower extremities.  Radial and posterior tibial pulses full and intact bilaterally.  Psychiatric:        Mood and Affect: Mood normal.     ED Results / Procedures / Treatments   Labs (all labs ordered are listed, but only abnormal results are displayed) Labs Reviewed  CBC WITH DIFFERENTIAL/PLATELET - Abnormal; Notable for the following components:      Result Value   Hemoglobin 11.8 (*)    RDW 16.8 (*)    All other components within normal limits  COMPREHENSIVE METABOLIC PANEL - Abnormal; Notable for the following components:   Glucose, Bld 106 (*)    Creatinine, Ser 1.13 (*)    GFR, Estimated 57 (*)    All other components within normal limits  URINALYSIS, ROUTINE W REFLEX MICROSCOPIC - Abnormal; Notable for the following components:   Color, Urine STRAW (*)    Ketones, ur 5 (*)    All other components within normal limits  TROPONIN I (HIGH SENSITIVITY)  TROPONIN I (HIGH SENSITIVITY)    EKG EKG Interpretation  Date/Time:  Thursday July 24 2022 01:49:41 EDT Ventricular Rate:  59 PR Interval:  158 QRS Duration: 82 QT Interval:  448 QTC Calculation: 443 R Axis:   26 Text Interpretation: Sinus bradycardia Possible Left atrial enlargement Left ventricular hypertrophy ( R in aVL , Sokolow-Lyon , Romhilt-Estes ) Abnormal ECG When compared with ECG of 13-Nov-2021 10:47, PREVIOUS ECG IS PRESENT Confirmed by Garnette Gunner [id number]) on 07/24/2022 2:31:42 PM  Radiology No results found.  Procedures Procedures    Medications Ordered in ED Medications  scopolamine  (TRANSDERM-SCOP) 1 MG/3DAYS 1.5 mg (1.5 mg Transdermal Patch Applied 07/24/22 1541)  ondansetron (ZOFRAN-ODT) disintegrating tablet 4 mg (4 mg Oral Given 07/24/22 0144)  meclizine (ANTIVERT) tablet 25 mg (25 mg Oral Given 07/24/22 0144)  sodium chloride 0.9 % bolus 500 mL (500 mLs Intravenous New Bag/Given 07/24/22 1405)  acetaminophen (TYLENOL) tablet 650 mg (650 mg Oral Given 07/24/22 1406)    ED Course/ Medical Decision Making/ A&P Clinical Course as of 07/24/22 1930  Thu Jul 24, 2022  1612 Reevaluation of patient after to medical management with meclizine and scopolamine showed persistent inability to walk unassisted secondary to gait instability.  Attending physician Dr. Truett Mainland was consulted regarding the patient and he agreed with imaging the patient for potential cerebellar infarct.  MRI brain ordered. [CR]    Clinical Course User Index [CR] Wilnette Kales, PA                           Medical Decision Making Amount and/or Complexity of Data Reviewed Radiology: ordered.  Risk OTC drugs. Prescription drug management.   This patient presents to the ED for concern of dizziness, this involves an extensive number of treatment options, and is a complaint that carries with it a high risk of complications and morbidity.  The differential diagnosis includes cerebrovascular accident, vestibular neuritis, BPPV, acoustic neuroma, otitis media, TIA   Co morbidities that complicate the patient evaluation  See HPI   Additional history obtained:  Additional history obtained from EMR External records from outside source obtained and reviewed including prior MRI from 07/08/2022 indicating no evidence of acute intracranial abnormality.  Mild multifocal T2 FLAIR hyperintense signal abnormality within the cerebral white matter, nonspecific but most often secondary to chronic small vessel ischemia.  Partially empty and expanded appearance of the sella turcica.   Lab Tests:  I Ordered, and  personally interpreted labs.  The pertinent results include: Initial troponin of 7 with repeat delta negative.  EKG showed mild ischemic changes as indicated below.  Doubt ACS given lack of patient's symptoms of active chest pain or shortness of breath.  Close follow-up with cardiology recommended upon assumed discharge.  No electrolyte abnormalities noted.  GFR slightly decreased from baseline with a minor bump in creatinine to 1.13 when compared to prior studies.  No leukocytosis.  Mild anemia with a hemoglobin of 11.8; this is slightly increased in patient's baseline.  UA insignificant for infection.   Imaging Studies ordered:  I ordered imaging studies including MRI brain without contrast Pending upon shift change   Cardiac Monitoring: / EKG:  The patient was maintained on a cardiac monitor.  I personally viewed and interpreted the cardiac monitored which showed an underlying rhythm of: sinus rhythm with ST and T wave abnormalities in V1, V2, aVL with no reciprocal changes.   Consultations Obtained:  N/a   Problem List / ED Course / Critical interventions / Medication management  I ordered medication including Tylenol for headache, Zofran for nausea, meclizine and scopolamine for vertigo type symptoms   Reevaluation of the patient after these medicines showed that the patient improved I have reviewed the patients home medicines and have made adjustments as needed   Social Determinants of Health:  Chronic cigarette use.  Denies illicit drug use.   Test / Admission - Considered:  Dizziness Vitals signs within normal range and stable throughout visit. Laboratory/imaging studies significant for: See above Patient's symptoms most likely secondary to peripheral cause of vertigo.  Patient unresponsive to medical therapy while in the emergency department and unable to walk unassisted.  Case was discussed with my attending physician Dr. Truett Mainland who recommended MR brain without contrast  for further evaluation of patient's symptoms.  Imaging study pending upon shift change.  Disposition of patient to be determined once imaging study has resulted.  Follow-up with cardiology recommended regardless of testing results.  At shift change, patient care handed off to Dr. Roslynn Amble.  Patient was stable upon shift change.         Final Clinical Impression(s) / ED Diagnoses Final diagnoses:  None    Rx / DC  Orders ED Discharge Orders     None         Wilnette Kales, [place] 07/24/22 1930    Cristie Hem, MD 07/25/22 [phone]

## 2022-07-24 NOTE — ED Triage Notes (Signed)
Sudden dizziness, right jaw pain, and left lower abdominal pain that began while cooking dinner ~5PM.

## 2022-07-24 NOTE — ED Notes (Signed)
Gave patient some crackers and a ginger ale patient is resting with call bell in reach

## 2022-07-24 NOTE — ED Notes (Signed)
Pt is in MRI per previous RN.

## 2022-07-24 NOTE — ED Notes (Signed)
Patient verbalizes understanding of d/c instructions. Opportunities for questions and answers were provided. Pt d/c from ED and wheeled to lobby where family will be picking pt up.

## 2022-07-24 NOTE — ED Notes (Signed)
Pt ambulated in the room with a steady gait.

## 2022-07-24 NOTE — Discharge Instructions (Signed)
Take antibiotic for possible ear infection.  Take the meclizine as needed for dizziness.  If you are having worsening dizziness, vomiting, fever or other new concerning symptom, come back to ER for reassessment.  Please follow-up with your primary care doctor next week for close recheck.

## 2022-07-24 NOTE — ED Notes (Signed)
Patient transported to MRI 

## 2022-07-31 ENCOUNTER — Ambulatory Visit (INDEPENDENT_AMBULATORY_CARE_PROVIDER_SITE_OTHER): Payer: Medicare Other | Admitting: Student

## 2022-07-31 ENCOUNTER — Other Ambulatory Visit (HOSPITAL_COMMUNITY)
Admission: RE | Admit: 2022-07-31 | Discharge: 2022-07-31 | Disposition: A | Discharge status: Home / Self Care | Payer: Medicare Other | Source: Ambulatory Visit | Attending: Student in an Organized Health Care Education/Training Program | Admitting: Student in an Organized Health Care Education/Training Program

## 2022-07-31 VITALS — BP 149/96 | HR 89 | Temp 98.2°F | Ht 66.0 in | Wt 247.5 lb

## 2022-07-31 DIAGNOSIS — H814 Vertigo of central origin: Secondary | ICD-10-CM

## 2022-07-31 DIAGNOSIS — N368 Other specified disorders of urethra: Secondary | ICD-10-CM | POA: Diagnosis not present

## 2022-07-31 DIAGNOSIS — Z124 Encounter for screening for malignant neoplasm of cervix: Secondary | ICD-10-CM

## 2022-07-31 DIAGNOSIS — Z1151 Encounter for screening for human papillomavirus (HPV): Secondary | ICD-10-CM | POA: Insufficient documentation

## 2022-07-31 DIAGNOSIS — R102 Pelvic and perineal pain: Secondary | ICD-10-CM | POA: Diagnosis not present

## 2022-07-31 DIAGNOSIS — R87810 Cervical high risk human papillomavirus (HPV) DNA test positive: Secondary | ICD-10-CM | POA: Insufficient documentation

## 2022-07-31 DIAGNOSIS — R42 Dizziness and giddiness: Secondary | ICD-10-CM | POA: Insufficient documentation

## 2022-07-31 DIAGNOSIS — Z113 Encounter for screening for infections with a predominantly sexual mode of transmission: Secondary | ICD-10-CM | POA: Insufficient documentation

## 2022-07-31 DIAGNOSIS — Z01419 Encounter for gynecological examination (general) (routine) without abnormal findings: Secondary | ICD-10-CM | POA: Diagnosis present

## 2022-07-31 DIAGNOSIS — R87618 Other abnormal cytological findings on specimens from cervix uteri: Secondary | ICD-10-CM

## 2022-07-31 NOTE — Assessment & Plan Note (Signed)
Patient reporting mild pelvic pain and an odor coming from her vagina. She denies urinary symptoms and any vaginal discharge. Speculum exam revealed urethral prolapse and small amount of discharge around cervical os.   - Order pap smear and STI panel - Referral to Ob-Gyn for evaluation of urethral prolapse

## 2022-07-31 NOTE — Patient Instructions (Signed)
  Thank you, Ms.Ma Rachel Bo, for allowing [place] to provide your care today. Today we discussed . . .  > Dizziness       - we are ordering a CT scan of your head to help find the cause of your symptoms       - we do not believe that you have an ear infection and you do not need to take any antibiotics at this time > Pelvic Pain       - we have performed a pap smear and will call you about the results   I have ordered the following labs for you:  Lab Orders  No laboratory test(s) ordered today      Tests ordered today:  CTA head and neck   Referrals ordered today:    Referral Orders         Ambulatory referral to Obstetrics / Gynecology       I have ordered the following medication/changed the following medications:   Stop the following medications: There are no discontinued medications.   Start the following medications: No orders of the defined types were placed in this encounter.     Follow up:  1 month     Remember:  Please get a CT scan of your head, which will help [place] identify the cause of your dizziness.   Should you have any questions or concerns please call the internal medicine clinic at [phone].     Roswell Nickel, MD [place]

## 2022-07-31 NOTE — Progress Notes (Signed)
   CC: Dizziness  HPI:   [name] is a 56 y.o. female with a past medical history of obesity, hypertension, OA, sinusitis, and benign multinodular goiter who presents for one week of dizziness.    Past Medical History:  Diagnosis Date   Anemia    COVID-19 virus infection 11/2020   Fibroids    Heart palpitations    History of    History of blood transfusion    with knee replacement   History of colon polyps    Hypertension    Hypertension    no current meds for htn   Knee joint injury    Morbid obesity ([place])    Pneumonia    remote history   Thyroid goiter    bilateral goiter and cyst follow up every 2 years Dr. Erik Obey   Tobacco abuse      Review of Systems:    Reports dizziness, nausea, headache, mild pelvic pain Denies fever, urinary frequency or urgency, dysuria, vaginal discharge   Physical Exam:  Vitals:   07/31/22 1320  BP: (!) 149/96  Pulse: 89  Temp: 98.2 F (36.8 C)  TempSrc: Oral  SpO2: 99%  Weight: 247 lb 8 oz (112.3 kg)  Height: '5\' 6"'$  (1.676 m)    General:   awake and alert, sitting comfortably in chair, cooperative, not in acute distress Skin:   warm and dry, intact without any obvious lesions or scars, no rashes or lesions  Head:   normocephalic and atraumatic, no lymphadenopathy Eyes:   extraocular movements intact, conjunctivae pink Ears:   pinnae normal, small amount of fluid in auditory canal and bubbles behind tympanic membrane on the left, left canal mildly erythematous, tympanic membranes not bulging or erythematous Lungs:   normal respiratory effort, breathing unlabored, symmetrical chest rise, no crackles or wheezing Cardiac:   regular rate and rhythm, normal S1 and S2 Pelvic:   urethral prolapse, small amount of discharge around cervical os Neurologic:   oriented to person-place-time, nystagmus present, no facial droop, CNVII intact, mild hearing loss in R compared to L Psychiatric:   mood and affect normal, intelligible  speech    Assessment & Plan:   Dizziness Symptoms started 8-2 with an episode of debilitating dizziness and visual disturbances including flashing lights. Although these symptoms improved, the dizziness never resolved. She visited the ED on 8-3 where MRI for acute infarction or hemorrhage was negative. She was diagnosed with peripheral vertigo and otitis media, given cefdinir, and discharged. Her dizziness has persisted and, while it is exacerbated by positional changes, it is also present at a baseline constant level. She denies hearing difficulty, ear discharge, fever, and chills. Dix Hallpike maneuver positive on left. Otoscopic exam revealed mild effusion bilaterally, but no signs of active infection. Etiology unclear at this time, but not likely vestibular neuritis. Given constant nature of the vertigo, a central cause is possible, specifically PCA ischemia.  - Order CTA head and neck to identify possible ischemia   Pelvic pain Patient reporting mild pelvic pain and an odor coming from her vagina. She denies urinary symptoms and any vaginal discharge. Speculum exam revealed urethral prolapse and small amount of discharge around cervical os.   - Order pap smear and STI panel - Referral to Ob-Gyn for evaluation of urethral prolapse     See Encounters Tab for problem based charting.  Patient seen with Dr. Philipp Ovens

## 2022-07-31 NOTE — Assessment & Plan Note (Signed)
Symptoms started 8-2 with an episode of debilitating dizziness and visual disturbances including flashing lights. Although these symptoms improved, the dizziness never resolved. She visited the ED on 8-3 where MRI for acute infarction or hemorrhage was negative. She was diagnosed with peripheral vertigo and otitis media, given cefdinir, and discharged. Her dizziness has persisted and, while it is exacerbated by positional changes, it is also present at a baseline constant level. She denies hearing difficulty, ear discharge, fever, and chills. Dix Hallpike maneuver positive on left. Otoscopic exam revealed mild effusion bilaterally, but no signs of active infection. Etiology unclear at this time, but not likely vestibular neuritis. Given constant nature of the vertigo, a central cause is possible, specifically PCA ischemia.  - Order CTA head and neck to identify possible ischemia

## 2022-08-01 LAB — CERVICOVAGINAL ANCILLARY ONLY
Bacterial Vaginitis (gardnerella): NEGATIVE
Candida Glabrata: NEGATIVE
Candida Vaginitis: NEGATIVE
Chlamydia: NEGATIVE
Comment: NEGATIVE
Comment: NEGATIVE
Comment: NEGATIVE
Comment: NEGATIVE
Comment: NEGATIVE
Comment: NORMAL
Neisseria Gonorrhea: NEGATIVE
Trichomonas: POSITIVE — AB

## 2022-08-01 NOTE — Progress Notes (Signed)
Internal Medicine Attending Note:  I have seen and evaluated this patient and I have discussed the plan of care with the house staff. Please see their note for complete details. I concur with their findings.  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 Clinical picture does not fix entirely with BPPV. Although MRI was negative for acute infarction, need to rule out symptomatic stenosis or vascular insufficiency. CTA head and neck ordered. Also on the differential would be a vertebrobasilar migraine, however this is a diagnosis of exclusion. Symptom onset started with what sounds like a scotoma with oscillating lights. Scotoma has resolved but she has persistent vertigo / dizziness without any periods of normalcy.    Velna Ochs, MD 08/01/2022, 9:38 AM

## 2022-08-02 ENCOUNTER — Ambulatory Visit (HOSPITAL_COMMUNITY)
Admission: EM | Admit: 2022-08-02 | Discharge: 2022-08-02 | Disposition: A | Discharge status: Home / Self Care | Payer: Medicare Other | Attending: Physician Assistant | Admitting: Physician Assistant

## 2022-08-02 ENCOUNTER — Encounter (HOSPITAL_COMMUNITY): Payer: Self-pay | Admitting: Emergency Medicine

## 2022-08-02 DIAGNOSIS — R110 Nausea: Secondary | ICD-10-CM

## 2022-08-02 DIAGNOSIS — R42 Dizziness and giddiness: Secondary | ICD-10-CM

## 2022-08-02 DIAGNOSIS — A599 Trichomoniasis, unspecified: Secondary | ICD-10-CM | POA: Diagnosis not present

## 2022-08-02 MED ORDER — ONDANSETRON HCL 4 MG PO TABS: 4.0000 mg | ORAL_TABLET | Freq: Three times a day (TID) | ORAL | 0 refills | Status: DC | PRN

## 2022-08-02 MED ORDER — METRONIDAZOLE 500 MG PO TABS: 500.0000 mg | ORAL_TABLET | Freq: Two times a day (BID) | ORAL | 0 refills | Status: DC

## 2022-08-02 NOTE — Discharge Instructions (Signed)
Your lab results were positive for trichomonas.  Please take the metronidazole as directed for treatment.  Avoid alcohol use with this medication.  Avoid sexual activity until symptoms have resolved.  Please alert all partners and have them treated as well if possible.  Continue follow-up with primary care for your vertigo symptoms.  I have sent Zofran to take as needed for nausea.  Please see the handout provided on how to perform home maneuvers to help alleviate vertigo symptoms.  Present to the emergency department for any sudden severe worsening symptoms.

## 2022-08-02 NOTE — ED Triage Notes (Signed)
Patient c/o abdominal pain, foul odor vaginal discharge and vertigo for several days.  Patient was recently treated for vertigo, followed up w/PCP for pap, tested for STI's and taken off of her vertigo meds.  Patient waiting on results, very uncomfortable.

## 2022-08-02 NOTE — ED Provider Notes (Signed)
[place]   MRN: [id number] DOB: 1966/10/08  Subjective:   [name] is a 56 y.o. female presenting for vaginal discharge and vertigo symptoms.  Patient states that she had a pelvic exam performed by her PCP 2 days ago and she still does not know results of STI screening, and this is causing a lot of stress.  She is having some vaginal discharge and spotting with odor.  She is hoping to find out the results and get treatment today if possible.  She has also been suffering with ongoing vertigo symptoms.  She had an emergency department visit on 07/24/2022.  She then had follow-up with her PCP 7 days later.  She is waiting to get set up for a CT angio head and neck with and without contrast.  Her MRI brain at initial ED visit was negative.  Her symptoms have not changed at all during this time.  She was given meclizine for vertigo and states that this was not agreeing with her very well and she stopped taking it.  No other symptoms to report at this time.  No current facility-administered medications for this encounter.  Current Outpatient Medications:    lisinopril (ZESTRIL) 5 MG tablet, Take 1 tablet (5 mg total) by mouth daily., Disp: 90 tablet, Rfl: 3   meclizine (ANTIVERT) 25 MG tablet, Take 1 tablet (25 mg total) by mouth 3 (three) times daily as needed for dizziness., Disp: 30 tablet, Rfl: 0   metoprolol succinate (TOPROL XL) 25 MG 24 hr tablet, Take 1 tablet (25 mg total) by mouth daily. May take one extra tablet as needed for heart racing, Disp: 90 tablet, Rfl: 3   metroNIDAZOLE (FLAGYL) 500 MG tablet, Take 1 tablet (500 mg total) by mouth 2 (two) times daily., Disp: 14 tablet, Rfl: 0   ondansetron (ZOFRAN) 4 MG tablet, Take 1 tablet (4 mg total) by mouth every 8 (eight) hours as needed for nausea or vomiting., Disp: 12 tablet, Rfl: 0   Allergies  Allergen Reactions   Tramadol Itching   Ibuprofen Nausea And Vomiting   Metronidazole Itching and Nausea And  Vomiting   Penicillins Itching    Denies hives, or airway issues.     Past Medical History:  Diagnosis Date   Anemia    COVID-19 virus infection 11/2020   Fibroids    Heart palpitations    History of    History of blood transfusion    with knee replacement   History of colon polyps    Hypertension    Hypertension    no current meds for htn   Knee joint injury    Morbid obesity ([place])    Pneumonia    remote history   Thyroid goiter    bilateral goiter and cyst follow up every 2 years Dr. Erik Obey   Tobacco abuse      Past Surgical History:  Procedure Laterality Date   ANKLE SURGERY Right 2009   broken ankle    arthroscopic surgery to knee Bilateral    BREAST BIOPSY Left 03/24/2019   CESAREAN SECTION     COLONOSCOPY     HAND SURGERY Bilateral    JOINT REPLACEMENT Bilateral    knees   REPLACEMENT TOTAL KNEE Right 2012   SHOULDER OPEN ROTATOR CUFF REPAIR Right 10/12/2019   Procedure: Right shoulder rotator cuff repair with graft and anchors;  Surgeon: Latanya Maudlin, MD;  Location: [facility];  Service: Orthopedics;  Laterality: Right;  20mn  TOTAL KNEE ARTHROPLASTY Left 01/03/2014   Procedure: LEFT TOTAL KNEE ARTHROPLASTY;  Surgeon: Tobi Bastos, MD;  Location: [facility];  Service: Orthopedics;  Laterality: Left;   TUBAL LIGATION      Family History  Problem Relation Age of Onset   Hypertension Mother    Dementia Mother    Hypertension Father    Atrial fibrillation Father    Cancer Maternal Grandmother        ? lung    Social History   Tobacco Use   Smoking status: Every Day    Packs/day: 0.20    Years: 20.00    Total pack years: 4.00    Types: Cigarettes   Smokeless tobacco: Never   Tobacco comments:    1-2  cigs per day  Vaping Use   Vaping Use: Never used  Substance Use Topics   Alcohol use: Yes    Alcohol/week: 2.0 standard drinks of alcohol    Types: 2 Standard drinks or equivalent per week    Comment: occasionally once per month   Drug use: No     ROS REFER TO HPI FOR PERTINENT POSITIVES AND NEGATIVES   Objective:   Vitals: BP 135/78 (BP Location: Right Arm)   Pulse 77   Temp 98.4 F (36.9 C) (Oral)   Resp 18   Ht '5\' 6"'$  (1.676 m)   Wt 247 lb 9.2 oz (112.3 kg)   LMP 06/07/2016   SpO2 99%   BMI 39.96 kg/m   Physical Exam Constitutional:      Appearance: She is well-developed. She is obese.  Cardiovascular:     Rate and Rhythm: Normal rate and regular rhythm.     Heart sounds: No murmur heard. Pulmonary:     Effort: Pulmonary effort is normal.     Breath sounds: Normal breath sounds. No wheezing.  Abdominal:     General: Abdomen is flat. Bowel sounds are normal.     Palpations: Abdomen is soft.     Tenderness: There is no abdominal tenderness. There is no right CVA tenderness or left CVA tenderness.  Neurological:     General: No focal deficit present.     Mental Status: She is alert and oriented to person, place, and time.     Cranial Nerves: No cranial nerve deficit.     Motor: No weakness.  Psychiatric:        Mood and Affect: Mood normal. Mood is not anxious or depressed.        Behavior: Behavior normal.     No results found for this or any previous visit (from the past 24 hour(s)).  Assessment and Plan :   PDMP not reviewed this encounter.  1. Trichomoniasis   2. Nausea without vomiting   3. Vertigo    Informed patient of her STI results that she has positive for trichomonas.  She will be treated with metronidazole 500 mg twice daily x7 days.  She states that this was incorrectly listed on her allergy list.  She knows to avoid alcohol use.  She knows to avoid sexual activity until symptoms resolved.  She is going to inform her partner of these results and hope they will be treated as well.  Vertigo symptoms have not changed.  No focal neurologic deficits noted.  She is waiting on an appointment for CT at this time.  I showed her how to perform Epley maneuver to help with symptoms.  I also  provided a prescription for Zofran to take as  needed.  Red flags when to present to the emergency department were also discussed with patient.    AllwardtRanda Evens, PA-C 08/02/22 1113

## 2022-08-04 ENCOUNTER — Encounter: Payer: Self-pay | Admitting: Neurology

## 2022-08-06 ENCOUNTER — Other Ambulatory Visit: Payer: Self-pay | Admitting: Student

## 2022-08-08 LAB — CYTOLOGY - PAP
Adequacy: ABNORMAL
Comment: NEGATIVE
Comment: NEGATIVE
Comment: NEGATIVE
HPV 16: POSITIVE — AB
HPV 18 / 45: NEGATIVE
High risk HPV: POSITIVE — AB

## 2022-08-11 ENCOUNTER — Telehealth: Payer: Self-pay | Admitting: Cardiovascular Disease

## 2022-08-11 DIAGNOSIS — H8309 Labyrinthitis, unspecified ear: Secondary | ICD-10-CM | POA: Diagnosis not present

## 2022-08-11 NOTE — Telephone Encounter (Signed)
Error

## 2022-08-11 NOTE — Addendum Note (Signed)
Addended by: Serita Butcher on: 08/11/2022 12:21 PM   Modules accepted: Orders

## 2022-08-14 NOTE — Progress Notes (Deleted)
Cardiology Office Note:    Date:  08/14/2022   ID:  [name], [name] 29-Jan-1966, MRN [id number]  PCP:  Serita Butcher, MD   [facility] Providers Cardiologist:  Lauree Chandler, MD { Click to update primary MD,subspecialty MD or APP then REFRESH:1}    Referring MD: Serita Butcher, MD   Chief Complaint: ***  History of Present Illness:    [name] is a *** 56 y.o. female with a hx of anemia, HTN, obesity, and tobacco abuse.  She established care with cardiology in December 2022 for evaluation of palpitations and was seen by Dr. Angelena Form.  She had recent urgent care visit with palpitations and chest tightness that occurred after climbing stairs.  She was restarted on atenolol by UC but had not started this at time of office visit.  She reported recurrent palpitations that lasted for several seconds associated with some dyspnea but no chest pain.  Echo 12/2021 revealed low normal LV systolic function 96%, no RWMA, moderate MR. Monitor with sinus rhythm, 2 beat runs of VT and 9 runs of SVT, longest lasting 16 seconds.  She was last seen in our office on 01/10/2022 by Dr. Angelena Form who felt that palpitations were likely 2/2 SVT.  She was advised to stop atenolol and start Toprol 25 mg daily with recommendation to take an extra 25 mg as needed.  Recommendation to repeat echo January 2024 for mitral regurgitation. Lisinopril 5 mg daily was added for NICM. She was advised to return in 1 year for follow-up.  Today, she is here for hospital follow-up.   Past Medical History:  Diagnosis Date   Anemia    COVID-19 virus infection 11/2020   Fibroids    Heart palpitations    History of    History of blood transfusion    with knee replacement   History of colon polyps    Hypertension    Hypertension    no current meds for htn   Knee joint injury    Morbid obesity ([place])    Pneumonia    remote history   Thyroid goiter    bilateral goiter and cyst follow up every 2 years Dr.  Erik Obey   Tobacco abuse     Past Surgical History:  Procedure Laterality Date   ANKLE SURGERY Right 2009   broken ankle    arthroscopic surgery to knee Bilateral    BREAST BIOPSY Left 03/24/2019   CESAREAN SECTION     COLONOSCOPY     HAND SURGERY Bilateral    JOINT REPLACEMENT Bilateral    knees   REPLACEMENT TOTAL KNEE Right 2012   SHOULDER OPEN ROTATOR CUFF REPAIR Right 10/12/2019   Procedure: Right shoulder rotator cuff repair with graft and anchors;  Surgeon: Latanya Maudlin, MD;  Location: [facility];  Service: Orthopedics;  Laterality: Right;  40mn   TOTAL KNEE ARTHROPLASTY Left 01/03/2014   Procedure: LEFT TOTAL KNEE ARTHROPLASTY;  Surgeon: RTobi Bastos MD;  Location: [facility];  Service: Orthopedics;  Laterality: Left;   TUBAL LIGATION      Current Medications: No outpatient medications have been marked as taking for the 08/15/22 encounter (Appointment) with SAnn Maki MLanice Schwab NP.     Allergies:   Tramadol, Ibuprofen, Metronidazole, and Penicillins   Social History   Socioeconomic History   Marital status: Single    Spouse name: Not on file   Number of children: 2   Years of education: Not on file   Highest education level: Not on  file  Occupational History   Occupation: disabled    Employer: DISABLED   Tobacco Use   Smoking status: Every Day    Packs/day: 0.20    Years: 20.00    Total pack years: 4.00    Types: Cigarettes   Smokeless tobacco: Never   Tobacco comments:    1-2  cigs per day  Vaping Use   Vaping Use: Never used  Substance and Sexual Activity   Alcohol use: Yes    Alcohol/week: 2.0 standard drinks of alcohol    Types: 2 Standard drinks or equivalent per week    Comment: occasionally once per month   Drug use: No   Sexual activity: Yes    Birth control/protection: Surgical  Other Topics Concern   Not on file  Social History Narrative   ** Merged History Encounter **       Applying for disability, previously worked in [place] Strain: Not on file  Food Insecurity: Not on file  Transportation Needs: Not on file  Physical Activity: Not on file  Stress: Not on file  Social Connections: Not on file     Family History: The patient's ***family history includes Atrial fibrillation in her father; Cancer in her maternal grandmother; Dementia in her mother; Hypertension in her father and mother.  ROS:   Please see the history of present illness.    *** All other systems reviewed and are negative.  Labs/Other Studies Reviewed:    The following studies were reviewed today:  Echo 01/01/22   1. Left ventricular ejection fraction, by estimation, is 45 to 50%. The  left ventricle has mildly decreased function. The left ventricle has no  regional wall motion abnormalities. Left ventricular diastolic parameters  are consistent with Grade II  diastolic dysfunction (pseudonormalization).   2. Right ventricular systolic function is normal. The right ventricular  size is normal.   3. Left atrial size was moderately dilated.   4. Right atrial size was mildly dilated.   5. The mitral valve is normal in structure. Moderate mitral valve  regurgitation. No evidence of mitral stenosis.   6. The aortic valve is normal in structure. Aortic valve regurgitation is  not visualized. No aortic stenosis is present.   7. The inferior vena cava is normal in size with greater than 50%  respiratory variability, suggesting right atrial pressure of 3 mmHg.   Cardiac monitor 12/13/21  Sinus rhythm.  2 Ventricular Tachycardia runs occurred, the run with the fastest interval lasting 4 beats with a max rate of 214 bpm (avg 149 bpm);  the run with the fastest interval was also the longest.  9 Supraventricular Tachycardia runs occurred, the run with the fastest interval lasting 4 beats with a max rate of 171 bpm, the longest lasting 16.1 secs with an avg rate of 105 bpm. Isolated premature  ventricular contractions. (<1.0%, 1023)  Recent Labs: 11/22/2021: TSH 1.730 07/24/2022: ALT 14; BUN 15; Creatinine, Ser 1.13; Hemoglobin 11.8; Platelets 213; Potassium 4.1; Sodium 138  Recent Lipid Panel    Component Value Date/Time   CHOL 113 10/18/2009 2100   TRIG 36 10/18/2009 2100   HDL 54 10/18/2009 2100   CHOLHDL 2.1 Ratio 10/18/2009 2100   VLDL 7 10/18/2009 2100   [place] 52 10/18/2009 2100     Risk Assessment/Calculations:   {Does this patient have ATRIAL FIBRILLATION?[phone]}       Physical Exam:    VS:  LMP 06/07/2016  Wt Readings from Last 3 Encounters:  08/02/22 247 lb 9.2 oz (112.3 kg)  07/31/22 247 lb 8 oz (112.3 kg)  07/24/22 240 lb (108.9 kg)     GEN: *** Well nourished, well developed in no acute distress HEENT: Normal NECK: No JVD; No carotid bruits CARDIAC: ***RRR, no murmurs, rubs, gallops RESPIRATORY:  Clear to auscultation without rales, wheezing or rhonchi  ABDOMEN: Soft, non-tender, non-distended MUSCULOSKELETAL:  No edema; No deformity. *** pedal pulses, ***bilaterally SKIN: Warm and dry NEUROLOGIC:  Alert and oriented x 3 PSYCHIATRIC:  Normal affect   EKG:  EKG is *** ordered today.  The ekg ordered today demonstrates ***  No BP recorded.  {Refresh Note OR Click here to enter BP  :1}***    Diagnoses:    No diagnosis found. Assessment and Plan:     NICM SVT Hypertension:  {Are you ordering a CV Procedure (e.g. stress test, cath, DCCV, TEE, etc)?   Press F2        :[id number]}   Disposition:  Medication Adjustments/Labs and Tests Ordered: Current medicines are reviewed at length with the patient today.  Concerns regarding medicines are outlined above.  No orders of the defined types were placed in this encounter.  No orders of the defined types were placed in this encounter.   There are no Patient Instructions on file for this visit.   Signed, Emmaline Life, NP  08/14/2022 7:49 PM    [facility]

## 2022-08-15 ENCOUNTER — Ambulatory Visit: Payer: Medicare Other | Admitting: Nurse Practitioner

## 2022-08-15 ENCOUNTER — Ambulatory Visit (HOSPITAL_COMMUNITY): Payer: Medicare Other

## 2022-08-20 NOTE — Progress Notes (Deleted)
Office Visit    Patient Name: [name] Date of Encounter: 08/20/2022  Primary Care Provider:  Serita Butcher, MD Primary Cardiologist:  Lauree Chandler, MD Primary Electrophysiologist: None  Chief Complaint    [name] is a 56 y.o. female with PMH of tobacco abuse, HTN, obesity, anemia who presents today for hospital follow-up.  Past Medical History    Past Medical History:  Diagnosis Date   Anemia    COVID-19 virus infection 11/2020   Fibroids    Heart palpitations    History of    History of blood transfusion    with knee replacement   History of colon polyps    Hypertension    Hypertension    no current meds for htn   Knee joint injury    Morbid obesity ([place])    Pneumonia    remote history   Thyroid goiter    bilateral goiter and cyst follow up every 2 years Dr. Erik Obey   Tobacco abuse    Past Surgical History:  Procedure Laterality Date   ANKLE SURGERY Right 2009   broken ankle    arthroscopic surgery to knee Bilateral    BREAST BIOPSY Left 03/24/2019   CESAREAN SECTION     COLONOSCOPY     HAND SURGERY Bilateral    JOINT REPLACEMENT Bilateral    knees   REPLACEMENT TOTAL KNEE Right 2012   SHOULDER OPEN ROTATOR CUFF REPAIR Right 10/12/2019   Procedure: Right shoulder rotator cuff repair with graft and anchors;  Surgeon: Latanya Maudlin, MD;  Location: [facility];  Service: Orthopedics;  Laterality: Right;  34mn   TOTAL KNEE ARTHROPLASTY Left 01/03/2014   Procedure: LEFT TOTAL KNEE ARTHROPLASTY;  Surgeon: RTobi Bastos MD;  Location: [facility];  Service: Orthopedics;  Laterality: Left;   TUBAL LIGATION      Allergies  Allergies  Allergen Reactions   Tramadol Itching   Ibuprofen Nausea And Vomiting   Metronidazole Itching and Nausea And Vomiting   Penicillins Itching    Denies hives, or airway issues.     History of Present Illness    [name] a 56year old female with the above-mentioned past medical history who  presents today for recent hospital follow-up.  She was initially seen by Dr. MAngelena Formin 11/2021 for complaint of palpitations.  Patient also endorsed some chest tightness with climbing stairs.  2D echo was ordered and showed mildly decreased LV function with EF of 45-50%, grade 2 DD with moderately dilated LA and mildly dilated RA, with moderate MV regurgitation.  She was also given ZIO monitor for 14 days that showed rare burst of SVT with rare PVCs and predominant sinus rhythm.  She was seen again in 12/2021 for follow-up of palpitations.  During visit patient reported overall doing well.  She was started on Toprol 25 mg daily with as needed 25 mg as needed for palpitations.  She was also started on lisinopril 5 mg daily due to low EF.      Since last being seen in the office patient reports***.  Patient denies chest pain, palpitations, dyspnea, PND, orthopnea, nausea, vomiting, dizziness, syncope, edema, weight gain, or early satiety.     ***Notes: -ED visit on 8/12 with vertigo -Repeat 2D echo and 2024 Home Medications    Current Outpatient Medications  Medication Sig Dispense Refill   lisinopril (ZESTRIL) 5 MG tablet Take 1 tablet (5 mg total) by mouth daily. 90 tablet 3   meclizine (ANTIVERT)  25 MG tablet Take 1 tablet (25 mg total) by mouth 3 (three) times daily as needed for dizziness. 30 tablet 0   metoprolol succinate (TOPROL XL) 25 MG 24 hr tablet Take 1 tablet (25 mg total) by mouth daily. May take one extra tablet as needed for heart racing 90 tablet 3   metroNIDAZOLE (FLAGYL) 500 MG tablet Take 1 tablet (500 mg total) by mouth 2 (two) times daily. 14 tablet 0   ondansetron (ZOFRAN) 4 MG tablet Take 1 tablet (4 mg total) by mouth every 8 (eight) hours as needed for nausea or vomiting. 12 tablet 0   No current facility-administered medications for this visit.     Review of Systems  Please see the history of present illness.    (+)*** (+)***  All other systems reviewed and  are otherwise negative except as noted above.  Physical Exam    Wt Readings from Last 3 Encounters:  08/02/22 247 lb 9.2 oz (112.3 kg)  07/31/22 247 lb 8 oz (112.3 kg)  07/24/22 240 lb (108.9 kg)   NU:UVOZD were no vitals filed for this visit.,There is no height or weight on file to calculate BMI.  Constitutional:      Appearance: Healthy appearance. Not in distress.  Neck:     Vascular: JVD normal.  Pulmonary:     Effort: Pulmonary effort is normal.     Breath sounds: No wheezing. No rales. Diminished in the bases Cardiovascular:     Normal rate. Regular rhythm. Normal S1. Normal S2.      Murmurs: There is no murmur.  Edema:    Peripheral edema absent.  Abdominal:     Palpations: Abdomen is soft non tender. There is no hepatomegaly.  Skin:    General: Skin is warm and dry.  Neurological:     General: No focal deficit present.     Mental Status: Alert and oriented to person, place and time.     Cranial Nerves: Cranial nerves are intact.  EKG/LABS/Other Studies Reviewed    ECG personally reviewed by me today - ***  Risk Assessment/Calculations:   {Does this patient have ATRIAL FIBRILLATION?[phone]}        Lab Results  Component Value Date   WBC 4.9 07/24/2022   HGB 11.8 (L) 07/24/2022   HCT 36.1 07/24/2022   MCV 92.6 07/24/2022   PLT 213 07/24/2022   Lab Results  Component Value Date   CREATININE 1.13 (H) 07/24/2022   BUN 15 07/24/2022   NA 138 07/24/2022   K 4.1 07/24/2022   CL 106 07/24/2022   CO2 24 07/24/2022   Lab Results  Component Value Date   ALT 14 07/24/2022   AST 22 07/24/2022   ALKPHOS 75 07/24/2022   BILITOT 0.7 07/24/2022   Lab Results  Component Value Date   CHOL 113 10/18/2009   HDL 54 10/18/2009   LDLCALC 52 10/18/2009   TRIG 36 10/18/2009   CHOLHDL 2.1 Ratio 10/18/2009    No results found for: "HGBA1C"  Assessment & Plan    1.  History of SVT: -14-day ZIO monitor worn and revealed burst of SVT with rare PVCs and  predominant sinus rhythm.   -Continue Toprol XL***  2.  Mitral regurgitation: -2D echo was ordered and showed mildly decreased LV function with EF of 45-50%, grade 2 DD with moderately dilated LA and mildly dilated RA, with moderate MV regurgitation. -  3.  NICM: -Patient's blood pressure today was*** -Continue lisinopril 5 mg and Toprol  XL as noted above 4.  Obesity: -Patient's BMI is:      Disposition: Follow-up with Lauree Chandler, MD or APP in *** months {Are you ordering a CV Procedure (e.g. stress test, cath, DCCV, TEE, etc)?   Press F2        :[id number]}   Medication Adjustments/Labs and Tests Ordered: Current medicines are reviewed at length with the patient today.  Concerns regarding medicines are outlined above.   Signed, Mable Fill, Marissa Nestle, NP 08/20/2022, 2:22 PM [place]

## 2022-08-22 ENCOUNTER — Ambulatory Visit: Payer: Medicare Other | Admitting: Nurse Practitioner

## 2022-08-28 ENCOUNTER — Ambulatory Visit (HOSPITAL_COMMUNITY)
Admission: RE | Admit: 2022-08-28 | Discharge: 2022-08-28 | Disposition: A | Discharge status: Home / Self Care | Payer: Medicare Other | Source: Ambulatory Visit | Attending: Internal Medicine | Admitting: Internal Medicine

## 2022-08-28 DIAGNOSIS — R42 Dizziness and giddiness: Secondary | ICD-10-CM | POA: Diagnosis not present

## 2022-08-28 DIAGNOSIS — H814 Vertigo of central origin: Secondary | ICD-10-CM | POA: Diagnosis not present

## 2022-08-28 MED ORDER — IOHEXOL 350 MG/ML SOLN
60.0000 mL | Freq: Once | INTRAVENOUS | Status: AC | PRN
  Administered 2022-08-28: 60 mL via INTRAVENOUS

## 2022-09-01 ENCOUNTER — Encounter: Payer: Medicare Other | Admitting: Internal Medicine

## 2022-09-01 ENCOUNTER — Encounter: Payer: Self-pay | Admitting: Student

## 2022-09-01 ENCOUNTER — Telehealth: Payer: Self-pay

## 2022-09-01 NOTE — Progress Notes (Deleted)
   CC: 1 month follow up   HPI:   [name] is a 57 y.o. female with a past medical history of obesity, hypertension, OA, sinusitis, and benign multinodular goiter who presents for one a follow up on her concern for of dizziness.   Please see problem-based list for further details, assessments, and plans.  Past Medical History:  Diagnosis Date   Anemia    COVID-19 virus infection 11/2020   Fibroids    Heart palpitations    History of    History of blood transfusion    with knee replacement   History of colon polyps    Hypertension    Hypertension    no current meds for htn   Knee joint injury    Morbid obesity ([place])    Pneumonia    remote history   Thyroid goiter    bilateral goiter and cyst follow up every 2 years Dr. Erik Obey   Tobacco abuse      Current Outpatient Medications (Cardiovascular):    lisinopril (ZESTRIL) 5 MG tablet, Take 1 tablet (5 mg total) by mouth daily.   metoprolol succinate (TOPROL XL) 25 MG 24 hr tablet, Take 1 tablet (25 mg total) by mouth daily. May take one extra tablet as needed for heart racing     Current Outpatient Medications (Other):    meclizine (ANTIVERT) 25 MG tablet, Take 1 tablet (25 mg total) by mouth 3 (three) times daily as needed for dizziness.   metroNIDAZOLE (FLAGYL) 500 MG tablet, Take 1 tablet (500 mg total) by mouth 2 (two) times daily.   ondansetron (ZOFRAN) 4 MG tablet, Take 1 tablet (4 mg total) by mouth every 8 (eight) hours as needed for nausea or vomiting.  Review of Systems:  Review of system negative unless stated in the problem list or HPI.    Physical Exam:  There were no vitals filed for this visit.  Physical Exam General: NAD HENT: NCAT Lungs: CTAB, no wheeze, rhonchi or rales.  Cardiovascular: Normal heart sounds, no r/m/g, 2+ pulses in all extremities. No LE edema Abdomen: No TTP, normal bowel sounds MSK: No asymmetry or muscle atrophy.  Skin: no lesions noted on exposed skin Neuro: Alert  and oriented x4. CN grossly intact Psych: Normal mood and normal affect   Assessment & Plan:   No problem-specific Assessment & Plan notes found for this encounter.   See Encounters Tab for problem based charting.  Patient discussed with Dr. {NAMES:[id number]::"Guilloud","Hoffman","Mullen","Narendra","Vincent","Machen","Lau","Hatcher"} Idamae Schuller, MD Tillie Rung. [facility], PGY-2   Dizziness MRI negative, CTA head and neck negative.

## 2022-09-01 NOTE — Telephone Encounter (Signed)
Requesting CT scan results, please call pt back.
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
## 2022-10-08 ENCOUNTER — Other Ambulatory Visit (INDEPENDENT_AMBULATORY_CARE_PROVIDER_SITE_OTHER): Payer: Medicare Other

## 2022-10-08 ENCOUNTER — Telehealth: Payer: Self-pay | Admitting: Neurology

## 2022-10-08 ENCOUNTER — Encounter: Payer: Self-pay | Admitting: Neurology

## 2022-10-08 ENCOUNTER — Ambulatory Visit (INDEPENDENT_AMBULATORY_CARE_PROVIDER_SITE_OTHER): Payer: Medicare Other | Admitting: Neurology

## 2022-10-08 ENCOUNTER — Telehealth: Payer: Self-pay

## 2022-10-08 VITALS — BP 145/95 | HR 88 | Ht 66.0 in | Wt 245.0 lb

## 2022-10-08 DIAGNOSIS — M542 Cervicalgia: Secondary | ICD-10-CM | POA: Diagnosis not present

## 2022-10-08 DIAGNOSIS — R42 Dizziness and giddiness: Secondary | ICD-10-CM

## 2022-10-08 DIAGNOSIS — G8929 Other chronic pain: Secondary | ICD-10-CM

## 2022-10-08 DIAGNOSIS — R519 Headache, unspecified: Secondary | ICD-10-CM

## 2022-10-08 DIAGNOSIS — H81399 Other peripheral vertigo, unspecified ear: Secondary | ICD-10-CM | POA: Diagnosis not present

## 2022-10-08 LAB — VITAMIN B12: Vitamin B-12: 111 pg/mL — ABNORMAL LOW (ref 211–911)

## 2022-10-08 MED ORDER — NORTRIPTYLINE HCL 25 MG PO CAPS: 25.0000 mg | ORAL_CAPSULE | Freq: Every day | ORAL | 0 refills | Status: DC

## 2022-10-08 NOTE — Patient Instructions (Signed)
I saw you today for headaches and dizziness. This does not appear to be a brain problem based on my testing and your MRI.  I would like to get some blood work today.  I would like to send you to rehab for dizziness (vestibular rehab).  I am starting a headache prevention medication today call Nortriptyline. You will take 25 mg before bed every day.  Let me know if you have any problems with this medication or your headaches or not improving or getting worse.  I would like to see you back in clinic in 2 months. I will be in touch when I have the results of your labs.  The physicians and staff at [facility] are committed to providing excellent care. You may receive a survey requesting feedback about your experience at our office. We strive to receive "very good" responses to the survey questions. If you feel that your experience would prevent you from giving the office a "very good " response, please contact our office to try to remedy the situation. We may be reached at [phone]. Thank you for taking the time out of your busy day to complete the survey.  Kai Levins, MD [facility]   Preventing Falls at [facility] are common, often dreaded events in the lives of older people. Aside from the obvious injuries and even death that may result, fall can cause wide-ranging consequences including loss of independence, mental decline, decreased activity and mobility. Younger people are also at risk of falling, especially those with chronic illnesses and fatigue.  Ways to reduce risk for falling Examine diet and medications. Warm foods and alcohol dilate blood vessels, which can lead to dizziness when standing. Sleep aids, antidepressants and pain medications can also increase the likelihood of a fall.  Get a vision exam. Poor vision, cataracts and glaucoma increase the chances of falling.  Check foot gear. Shoes should fit snugly and have a sturdy, nonskid sole and a broad, low  heel  Participate in a physician-approved exercise program to build and maintain muscle strength and improve balance and coordination. Programs that use ankle weights or stretch bands are excellent for muscle-strengthening. Water aerobics programs and low-impact Tai Chi programs have also been shown to improve balance and coordination.  Increase vitamin D intake. Vitamin D improves muscle strength and increases the amount of calcium the body is able to absorb and deposit in bones.  How to prevent falls from common hazards Floors - Remove all loose wires, cords, and throw rugs. Minimize clutter. Make sure rugs are anchored and smooth. Keep furniture in its usual place.  Chairs -- Use chairs with straight backs, armrests and firm seats. Add firm cushions to existing pieces to add height.  Bathroom - Install grab bars and non-skid tape in the tub or shower. Use a bathtub transfer bench or a shower chair with a back support Use an elevated toilet seat and/or safety rails to assist standing from a low surface. Do not use towel racks or bathroom tissue holders to help you stand.  Lighting - Make sure halls, stairways, and entrances are well-lit. Install a night light in your bathroom or hallway. Make sure there is a light switch at the top and bottom of the staircase. Turn lights on if you get up in the middle of the night. Make sure lamps or light switches are within reach of the bed if you have to get up during the night.  Kitchen - Install non-skid rubber mats near  the sink and stove. Clean spills immediately. Store frequently used utensils, pots, pans between waist and eye level. This helps prevent reaching and bending. Sit when getting things out of lower cupboards.  Living room/ Bedrooms - Place furniture with wide spaces in between, giving enough room to move around. Establish a route through the living room that gives you something to hold onto as you walk.  Stairs - Make sure treads, rails, and  rugs are secure. Install a rail on both sides of the stairs. If stairs are a threat, it might be helpful to arrange most of your activities on the lower level to reduce the number of times you must climb the stairs.  Entrances and doorways - Install metal handles on the walls adjacent to the doorknobs of all doors to make it more secure as you travel through the doorway.  Tips for maintaining balance Keep at least one hand free at all times. Try using a backpack or fanny pack to hold things rather than carrying them in your hands. Never carry objects in both hands when walking as this interferes with keeping your balance.  Attempt to swing both arms from front to back while walking. This might require a conscious effort if Parkinson's disease has diminished your movement. It will, however, help you to maintain balance and posture, and reduce fatigue.  Consciously lift your feet off of the ground when walking. Shuffling and dragging of the feet is a common culprit in losing your balance.  When trying to navigate turns, use a "U" technique of facing forward and making a wide turn, rather than pivoting sharply.  Try to stand with your feet shoulder-length apart. When your feet are close together for any length of time, you increase your risk of losing your balance and falling.  Do one thing at a time. Don't try to walk and accomplish another task, such as reading or looking around. The decrease in your automatic reflexes complicates motor function, so the less distraction, the better.  Do not wear rubber or gripping soled shoes, they might "catch" on the floor and cause tripping.  Move slowly when changing positions. Use deliberate, concentrated movements and, if needed, use a grab bar or walking aid. Count 15 seconds between each movement. For example, when rising from a seated position, wait 15 seconds after standing to begin walking.  If balance is a continuous problem, you might want to  consider a walking aid such as a cane, walking stick, or walker. Once you've mastered walking with help, you might be ready to try it on your own again.

## 2022-10-08 NOTE — Telephone Encounter (Signed)
Called patient and informed her that her B12 was very low and needed to be supplemented. She wrote down my recommendation and said she would pick it up at the drug store tomorrow.  Plan: -Start B12 1000 mcg daily supplementation  All questions were answered.  Kai Levins, MD [facility]

## 2022-10-13 LAB — VITAMIN B1: Vitamin B1 (Thiamine): 8 nmol/L (ref 8–30)

## 2022-10-15 ENCOUNTER — Ambulatory Visit: Payer: Medicare Other | Admitting: Obstetrics and Gynecology

## 2022-10-15 ENCOUNTER — Encounter: Payer: Self-pay | Admitting: Student

## 2022-10-15 ENCOUNTER — Ambulatory Visit (INDEPENDENT_AMBULATORY_CARE_PROVIDER_SITE_OTHER): Payer: Medicare Other | Admitting: Student

## 2022-10-15 VITALS — BP 137/83 | HR 78 | Temp 98.0°F | Wt 252.0 lb

## 2022-10-15 DIAGNOSIS — N644 Mastodynia: Secondary | ICD-10-CM | POA: Insufficient documentation

## 2022-10-15 DIAGNOSIS — F17210 Nicotine dependence, cigarettes, uncomplicated: Secondary | ICD-10-CM

## 2022-10-15 DIAGNOSIS — I10 Essential (primary) hypertension: Secondary | ICD-10-CM | POA: Diagnosis not present

## 2022-10-15 NOTE — Assessment & Plan Note (Signed)
Patient presents with acute left breast pain for 1 week.  Pain is described as dull aching mostly on the lateral side with at times sharp pain.  Denies any nipple discharge, bleeding or redness. Mammogram from 2021 did not show any malignancy. Denies any family hx of breast cancer. She does endorse hx of vertigo causing her to fall. Last fall she landed on her knees and then elbow. Does not recall any injury or trauma to breasts. Breast exam did not reveal any masses but she is tender to palpation on lateral left breast. An area of skin color change on lateral side that could suggest bruising. Given acute breast pain, we will send her for mammogram and ultrasound of her breast.   Plan -Diagnostic mammogram and ultrasound of left breast

## 2022-10-15 NOTE — Assessment & Plan Note (Addendum)
BP today is under control. She is on lisinopril 5 mg daily and metoprolol 25 mg daily, which was prescribed by Dr. Angelena Form from cardiology for non-ischemic cardiomyopathy.   Plan  -continue lisinopril and metoprolol

## 2022-10-15 NOTE — Progress Notes (Signed)
   CC: left breast pain  HPI:  [name] is a 56 y.o. female living with a history stated below and presents today for left breast pain. Please see problem based assessment and plan for additional details.  Past Medical History:  Diagnosis Date   Anemia    COVID-19 virus infection 11/2020   Fibroids    Heart palpitations    History of    History of blood transfusion    with knee replacement   History of colon polyps    Hypertension    Hypertension    no current meds for htn   Knee joint injury    Morbid obesity ([place])    Pneumonia    remote history   Thyroid goiter    bilateral goiter and cyst follow up every 2 years Dr. Erik Lambert   Tobacco abuse     Current Outpatient Medications on File Prior to Visit  Medication Sig Dispense Refill   lisinopril (ZESTRIL) 5 MG tablet Take 1 tablet (5 mg total) by mouth daily. 90 tablet 3   metoprolol succinate (TOPROL XL) 25 MG 24 hr tablet Take 1 tablet (25 mg total) by mouth daily. May take one extra tablet as needed for heart racing 90 tablet 3   nortriptyline (PAMELOR) 25 MG capsule Take 1 capsule (25 mg total) by mouth at bedtime. 90 capsule 0   No current facility-administered medications on file prior to visit.    Review of Systems: ROS negative except for what is noted on the assessment and plan.  Vitals:   10/15/22 1312  BP: 137/83  Pulse: 78  Temp: 98 F (36.7 C)  TempSrc: Oral  SpO2: 100%  Weight: 252 lb (114.3 kg)   Physical Exam: Constitutional: well-appearing female, sitting in chair, in no acute distress HENT: normocephalic atraumatic Neck: supple Chest: (Chaperone was present during exam) Breasts: Right was normal on exam. Left had tenderness to palpation mostly on lateral side. No bleeding, mass or nipple discharge.  Pulmonary: normal work of breathing on room air MSK: normal bulk and tone Neurological: alert & oriented x 3 Skin: warm and dry Psych: normal mood and behavior  Assessment & Plan:    Breast pain, left Patient presents with acute left breast pain for 1 week.  Pain is described as dull aching mostly on the lateral side with at times sharp pain.  Denies any nipple discharge, bleeding or redness. Mammogram from 2021 did not show any malignancy. Denies any family hx of breast cancer. She does endorse hx of vertigo causing her to fall. Last fall she landed on her knees and then elbow. Does not recall any injury or trauma to breasts. Breast exam did not reveal any masses but she is tender to palpation on lateral left breast. An area of skin color change on lateral side that could suggest bruising. Given acute breast pain, we will send her for mammogram and ultrasound of her breast.   Plan -Diagnostic mammogram and ultrasound of left breast  Essential hypertension BP today is under control. She is on lisinopril 5 mg daily and metoprolol 25 mg daily, which was prescribed by Dr. Angelena Lambert from cardiology for non-ischemic cardiomyopathy.   Plan  -continue lisinopril and metoprolol   Patient seen with Dr. Alvin Lambert, D.O. [facility], PGY-1 Phone: [phone] Date 10/15/2022 Time 6:06 PM

## 2022-10-15 NOTE — Patient Instructions (Addendum)
Thank you, [name] for allowing [place] to provide your care today. Today we discussed your breast pain and blood pressure.  -I am sorry to hear about the breast pain. I have sent orders for mammogram and ultrasound of your breast.  -Your blood pressure is doing well today.   Referrals ordered today:   Referral Orders  No referral(s) requested today     I have ordered the following medication/changed the following medications:   Stop the following medications: There are no discontinued medications.   Start the following medications: No orders of the defined types were placed in this encounter.    Follow up:  1 month     Should you have any questions or concerns please call the internal medicine clinic at [phone].    Angelique Blonder, D.O. [place]

## 2022-10-17 ENCOUNTER — Ambulatory Visit: Payer: Medicare Other | Admitting: Physical Therapy

## 2022-10-17 NOTE — Therapy (Incomplete)
OUTPATIENT PHYSICAL THERAPY VESTIBULAR EVALUATION     Patient Name: [name] MRN: [id number] DOB:June 14, 1966, 56 y.o., female Today's Date: 10/17/2022    Past Medical History:  Diagnosis Date   Anemia    COVID-19 virus infection 11/2020   Fibroids    Heart palpitations    History of    History of blood transfusion    with knee replacement   History of colon polyps    Hypertension    Hypertension    no current meds for htn   Knee joint injury    Morbid obesity ([place])    Pneumonia    remote history   Thyroid goiter    bilateral goiter and cyst follow up every 2 years Dr. Erik Obey   Tobacco abuse    Past Surgical History:  Procedure Laterality Date   ANKLE SURGERY Right 2009   broken ankle    arthroscopic surgery to knee Bilateral    BREAST BIOPSY Left 03/24/2019   CESAREAN SECTION     COLONOSCOPY     HAND SURGERY Bilateral    JOINT REPLACEMENT Bilateral    knees   REPLACEMENT TOTAL KNEE Right 2012   SHOULDER OPEN ROTATOR CUFF REPAIR Right 10/12/2019   Procedure: Right shoulder rotator cuff repair with graft and anchors;  Surgeon: Latanya Maudlin, MD;  Location: [facility];  Service: Orthopedics;  Laterality: Right;  35mn   TOTAL KNEE ARTHROPLASTY Left 01/03/2014   Procedure: LEFT TOTAL KNEE ARTHROPLASTY;  Surgeon: RTobi Bastos MD;  Location: [facility];  Service: Orthopedics;  Laterality: Left;   TUBAL LIGATION     Patient Active Problem List   Diagnosis Date Noted   Breast pain, left 10/15/2022   Pelvic pain 07/31/2022   Dizziness 07/31/2022   Headache 05/30/2022   Recurrent sinusitis 11/28/2021   Routine screening for STI (sexually transmitted infection) 11/28/2021   Dysuria 11/28/2021   Tobacco abuse    COVID-19 virus infection 11/2020   Intermittent palpitations 11/17/2017   Menopause 06/23/2017   Hot flashes 09/23/2016   Healthcare maintenance 09/23/2016   S/P total knee arthroplasty, 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   Uterine fibroid 05/09/2013   Tobacco use  disorder 04/12/2013   OA (osteoarthritis) 10/28/2011   Toxic multinodular goiter 09/17/2009   MORBID OBESITY 09/17/2009   Essential hypertension 09/17/2009   Pain in limb 09/17/2009   ANEMIA, HX OF 09/17/2009    PCP: HSerita Butcher MD REFERRING PROVIDER: HShellia Carwin MD  REFERRING DIAG: [phone](ICD-10-CM) - Chronic nonintractable headache, unspecified headache type R42 (ICD-10-CM) - Vertigo H81.399 (ICD-10-CM) - Peripheral vertigo, unspecified laterality    THERAPY DIAG:  No diagnosis found.  ONSET DATE: 10/08/2022  Rationale for Evaluation and Treatment Rehabilitation  SUBJECTIVE:   SUBJECTIVE STATEMENT: Pt had acute onset of vertigo in early August with vomiting and room spinning. She was taken to the ER by EMS where she had imaging of her head that ruled out a stroke.Patient was given meclizine and scopolamine. MRI brain was repeated and was similar to prior MRI brain from the end of 06/2022. Patient improved after tylenol, zofran, meclizine, and scopolamine.  Saw ENT on 08/11/22 who diagnosed her with viral labyrinthitis. Pt saw neurologist on 10/08/22 who documents a peripheral etiology of vertigo   Pt accompanied by: {accompnied:27141}  PERTINENT HISTORY: HTN, OA, sinusitis  He has intermittent episodes of HA in left face/head. Episodes occur about once per month.   PAIN:  Are you having pain? {OPRCPAIN:27236}  PRECAUTIONS: {Therapy precautions:24002}  WEIGHT BEARING RESTRICTIONS: {Yes ***/  No:24003}  FALLS: Has patient fallen in last 6 months? {fallsyesno:27318}  LIVING ENVIRONMENT: Lives with: {OPRC lives with:25569::"lives with their family"} Lives in: {Lives in:25570} Stairs: {opstairs:27293} Has following equipment at home: {Assistive devices:23999}  PLOF: {PLOF:24004}  PATIENT GOALS: ***  OBJECTIVE:   DIAGNOSTIC FINDINGS: MRI brain wo contrast (07/24/22): FINDINGS: Brain: Negative for acute infarct. Mild white matter changes, stable from  the prior study. Negative for hemorrhage, mass, or hydrocephalus.   Empty sella. Sella is significantly enlarged and filled with CSF unchanged from the prior study   Vascular: Normal arterial flow voids.   Skull and upper cervical spine: No focal skeletal abnormality   Sinuses/Orbits: Minimal mucosal edema paranasal sinuses. Negative orbit   Other: None   IMPRESSION: No acute abnormality. Mild white matter changes consistent with chronic microvascular ischemia.    COGNITION: Overall cognitive status: {cognition:24006}   SENSATION: {sensation:27233}  EDEMA:  {edema:24020}  MUSCLE TONE:  {LE tone:25568}  DTRs:  {DTR SITE:24025}  POSTURE:  {posture:25561}  Cervical ROM:    {AROM/PROM:27142} A/PROM (deg) eval  Flexion   Extension   Right lateral flexion   Left lateral flexion   Right rotation   Left rotation   (Blank rows = not tested)  STRENGTH: ***  LOWER EXTREMITY MMT:   MMT Right eval Left eval  Hip flexion    Hip abduction    Hip adduction    Hip internal rotation    Hip external rotation    Knee flexion    Knee extension    Ankle dorsiflexion    Ankle plantarflexion    Ankle inversion    Ankle eversion    (Blank rows = not tested)  BED MOBILITY:  {Bed mobility:24027}  TRANSFERS: Assistive device utilized: {Assistive devices:23999}  Sit to stand: {Levels of assistance:24026} Stand to sit: {Levels of assistance:24026} Chair to chair: {Levels of assistance:24026} Floor: {Levels of assistance:24026}  RAMP: {Levels of assistance:24026}  CURB: {Levels of assistance:24026}  GAIT: Gait pattern: {gait characteristics:25376} Distance walked: *** Assistive device utilized: {Assistive devices:23999} Level of assistance: {Levels of assistance:24026} Comments: ***  FUNCTIONAL TESTS:  {Functional tests:24029}  PATIENT SURVEYS:  {rehab surveys:24030}  VESTIBULAR ASSESSMENT:  GENERAL OBSERVATION: ***   SYMPTOM  BEHAVIOR:  Subjective history: ***  Non-Vestibular symptoms: {nonvestibular symptoms:25260}  Type of dizziness: {Type of Dizziness:25255}  Frequency: ***  Duration: ***  Aggravating factors: {Aggravating Factors:25258}  Relieving factors: {Relieving Factors:25259}  Progression of symptoms: {DESC; BETTER/WORSE:18575}  OCULOMOTOR EXAM:  Ocular Alignment: {Ocular Alignment:25262}  Ocular ROM: {RANGE OF MOTION:21649}  Spontaneous Nystagmus: {Spontaneous nystagmus:25263}  Gaze-Induced Nystagmus: {gaze-induced nystagmus:25264}  Smooth Pursuits: {smooth pursuit:25265}  Saccades: {saccades:25266}  Convergence/Divergence: *** cm   FRENZEL - FIXATION SUPRESSED:  Ocular Alignment: {Ocular Alignment:25262}  Spontaneous Nystagmus: {Spontaneous nystagmus:25263}  Gaze-Induced Nystagmus: {gaze-induced nystagmus:25264}  Horizontal head shaking - induced nystagmus: {head shaking induced nystagmus:25267}  Vertical head shaking - induced nystagmus: {head shaking induced nystagmus:25267}  Positional tests: {Positional tests:25271}  Pressure tests: {frenzel pressure tests:25268}  VESTIBULAR - OCULAR REFLEX:   Slow VOR: {slow VOR:25290}  VOR Cancellation: {vor cancellation:25291}  Head-Impulse Test: {head impulse test:25272}  Dynamic Visual Acuity: {dynamic visual acuity:25273}   POSITIONAL TESTING: {Positional tests:25271}    MOTION SENSITIVITY:  Motion Sensitivity Quotient Intensity: 0 = none, 1 = Lightheaded, 2 = Mild, 3 = Moderate, 4 = Severe, 5 = Vomiting  Intensity  1. Sitting to supine   2. Supine to L side   3. Supine to R side   4. Supine to sitting   5.  L Hallpike-Dix   6. Up from L    7. R Hallpike-Dix   8. Up from R    9. Sitting, head tipped to L knee   10. Head up from L knee   11. Sitting, head tipped to R knee   12. Head up from R knee   13. Sitting head turns x5   14.Sitting head nods x5   15. In stance, 180 turn to L    16. In stance, 180 turn to R      OTHOSTATICS: {Exam; orthostatics:31331}  FUNCTIONAL GAIT: {Functional tests:24029}   VESTIBULAR TREATMENT:                                                                                                  DATE: ***  Canalith Repositioning:  {Canalith Repositioning:25283} Gaze Adaptation:  {gaze adaptation:25286} Habituation:  {habituation:25288} Other: ***  PATIENT EDUCATION: Education details: *** Person educated: {Person educated:25204} Education method: {Education Method:25205} Education comprehension: {Education Comprehension:25206}  HOME EXERCISE PROGRAM:  GOALS: Goals reviewed with patient? {yes/no:20286}  SHORT TERM GOALS: Target date: {follow up:25551}   *** Baseline: Goal status: {GOALSTATUS:25110}  2.  *** Baseline:  Goal status: {GOALSTATUS:25110}  3.  *** Baseline:  Goal status: {GOALSTATUS:25110}  4.  *** Baseline:  Goal status: {GOALSTATUS:25110}  5.  *** Baseline:  Goal status: {GOALSTATUS:25110}  6.  *** Baseline:  Goal status: {GOALSTATUS:25110}  LONG TERM GOALS: Target date: {follow up:25551}    *** Baseline:  Goal status: {GOALSTATUS:25110}  2.  *** Baseline:  Goal status: {GOALSTATUS:25110}  3.  *** Baseline:  Goal status: {GOALSTATUS:25110}  4.  *** Baseline:  Goal status: {GOALSTATUS:25110}  5.  *** Baseline:  Goal status: {GOALSTATUS:25110}  6.  *** Baseline:  Goal status: {GOALSTATUS:25110}  ASSESSMENT:  CLINICAL IMPRESSION: Patient is a *** y.o. *** who was seen today for physical therapy evaluation and treatment for ***.   OBJECTIVE IMPAIRMENTS: {opptimpairments:25111}.   ACTIVITY LIMITATIONS: {activitylimitations:27494}  PARTICIPATION LIMITATIONS: {participationrestrictions:25113}  PERSONAL FACTORS: {Personal factors:25162} are also affecting patient's functional outcome.   REHAB POTENTIAL: {rehabpotential:25112}  CLINICAL DECISION MAKING: {clinical decision making:25114}  EVALUATION  COMPLEXITY: {Evaluation complexity:25115}   PLAN: PT FREQUENCY: {rehab frequency:25116}  PT DURATION: {rehab duration:25117}  PLANNED INTERVENTIONS: {rehab planned interventions:25118::"Therapeutic exercises","Therapeutic activity","Neuromuscular re-education","Balance training","Gait training","Patient/Family education","Self Care","Joint mobilization"}  PLAN FOR NEXT SESSION: ***   Arliss Journey, PT, DPT  10/17/2022, 9:27 AM

## 2022-10-20 ENCOUNTER — Encounter: Payer: Medicare Other | Admitting: Student

## 2022-10-21 NOTE — Progress Notes (Signed)
Internal Medicine Clinic Attending  I saw and evaluated the patient.  I personally confirmed the key portions of the history and exam documented by Dr. Zheng and I reviewed pertinent patient test results.  The assessment, diagnosis, and plan were formulated together and I agree with the documentation in the resident's note.  

## 2022-10-22 ENCOUNTER — Encounter: Payer: Self-pay | Admitting: Student

## 2022-10-23 ENCOUNTER — Ambulatory Visit
Admission: RE | Admit: 2022-10-23 | Discharge: 2022-10-23 | Disposition: A | Discharge status: Home / Self Care | Payer: Medicare Other | Source: Ambulatory Visit | Attending: Internal Medicine | Admitting: Internal Medicine

## 2022-10-23 ENCOUNTER — Other Ambulatory Visit: Payer: Self-pay | Admitting: Internal Medicine

## 2022-10-23 DIAGNOSIS — N644 Mastodynia: Secondary | ICD-10-CM

## 2022-10-23 DIAGNOSIS — R928 Other abnormal and inconclusive findings on diagnostic imaging of breast: Secondary | ICD-10-CM | POA: Diagnosis not present

## 2022-10-23 DIAGNOSIS — N6325 Unspecified lump in the left breast, overlapping quadrants: Secondary | ICD-10-CM | POA: Diagnosis not present

## 2022-10-23 DIAGNOSIS — N6320 Unspecified lump in the left breast, unspecified quadrant: Secondary | ICD-10-CM

## 2022-10-23 DIAGNOSIS — R92323 Mammographic fibroglandular density, bilateral breasts: Secondary | ICD-10-CM | POA: Diagnosis not present

## 2022-11-05 ENCOUNTER — Encounter: Payer: Medicare Other | Admitting: Student

## 2022-11-05 DIAGNOSIS — R42 Dizziness and giddiness: Secondary | ICD-10-CM | POA: Diagnosis not present

## 2022-11-05 DIAGNOSIS — J019 Acute sinusitis, unspecified: Secondary | ICD-10-CM | POA: Diagnosis not present

## 2022-11-10 ENCOUNTER — Ambulatory Visit: Payer: Medicare Other | Admitting: Physical Therapy

## 2022-11-18 ENCOUNTER — Ambulatory Visit: Payer: Medicare Other | Attending: Neurology | Admitting: Physical Therapy

## 2022-11-18 NOTE — Therapy (Incomplete)
OUTPATIENT PHYSICAL THERAPY VESTIBULAR EVALUATION     Patient Name: [name] MRN: [id number] DOB:06-Sep-1966, 56 y.o., female Today's Date: 11/18/2022  END OF SESSION:   Past Medical History:  Diagnosis Date   Anemia    COVID-19 virus infection 11/2020   Fibroids    Heart palpitations    History of    History of blood transfusion    with knee replacement   History of colon polyps    Hypertension    Hypertension    no current meds for htn   Knee joint injury    Morbid obesity ([place])    Pneumonia    remote history   Thyroid goiter    bilateral goiter and cyst follow up every 2 years Dr. Erik Obey   Tobacco abuse    Past Surgical History:  Procedure Laterality Date   ANKLE SURGERY Right 2009   broken ankle    arthroscopic surgery to knee Bilateral    BREAST BIOPSY Left 03/24/2019   CESAREAN SECTION     COLONOSCOPY     HAND SURGERY Bilateral    JOINT REPLACEMENT Bilateral    knees   REPLACEMENT TOTAL KNEE Right 2012   SHOULDER OPEN ROTATOR CUFF REPAIR Right 10/12/2019   Procedure: Right shoulder rotator cuff repair with graft and anchors;  Surgeon: Latanya Maudlin, MD;  Location: [facility];  Service: Orthopedics;  Laterality: Right;  32mn   TOTAL KNEE ARTHROPLASTY Left 01/03/2014   Procedure: LEFT TOTAL KNEE ARTHROPLASTY;  Surgeon: RTobi Bastos MD;  Location: [facility];  Service: Orthopedics;  Laterality: Left;   TUBAL LIGATION     Patient Active Problem List   Diagnosis Date Noted   Breast pain, left 10/15/2022   Pelvic pain 07/31/2022   Dizziness 07/31/2022   Headache 05/30/2022   Recurrent sinusitis 11/28/2021   Routine screening for STI (sexually transmitted infection) 11/28/2021   Dysuria 11/28/2021   Tobacco abuse    COVID-19 virus infection 11/2020   Intermittent palpitations 11/17/2017   Menopause 06/23/2017   Hot flashes 09/23/2016   Healthcare maintenance 09/23/2016   S/P total knee arthroplasty, 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   Uterine fibroid 05/09/2013    Tobacco use disorder 04/12/2013   OA (osteoarthritis) 10/28/2011   Toxic multinodular goiter 09/17/2009   MORBID OBESITY 09/17/2009   Essential hypertension 09/17/2009   Pain in limb 09/17/2009   ANEMIA, HX OF 09/17/2009    PCP: HSerita Butcher MD   REFERRING PROVIDER: HShellia Carwin MD  REFERRING DIAG: [phone](ICD-10-CM) - Chronic nonintractable headache, unspecified headache type R42 (ICD-10-CM) - Vertigo H81.399 (ICD-10-CM) - Peripheral vertigo, unspecified laterality  THERAPY DIAG:  No diagnosis found.  ONSET DATE: 10/08/2022  Rationale for Evaluation and Treatment: Rehabilitation  SUBJECTIVE:   SUBJECTIVE STATEMENT: *** Pt accompanied by: {accompnied:27141}  PERTINENT HISTORY: PMH: Anemia, HTN, morbid obesity, tobacco abuse, hx of R and L total knee arthroplasty    She had acute onset of vertigo in early August with vomiting. She was taken to the ER by EMS where she had imaging of her head that ruled out a stroke. She was discharged with meclizine and an antibiotic.   Saw an ENT and was diagnosed with viral labyrinthitis.   She continues with some dizziness. It feels like she is unsteady with poor balance. There is no spinning. She feels it sitting or standing and is constant   PAIN:  Are you having pain? {OPRCPAIN:27236}  PRECAUTIONS: {Therapy precautions:24002}  WEIGHT BEARING RESTRICTIONS: {Yes ***/No:24003}  FALLS: Has patient fallen  in last 6 months? {fallsyesno:27318}  LIVING ENVIRONMENT: Lives with: {OPRC lives with:25569::"lives with their family"} Lives in: {Lives in:25570} Stairs: {opstairs:27293} Has following equipment at home: {Assistive devices:23999}  PLOF: {PLOF:24004}  PATIENT GOALS: ***  OBJECTIVE:   DIAGNOSTIC FINDINGS:  MRI brain on 07/24/22:  IMPRESSION: No acute abnormality. Mild white matter changes consistent with chronic microvascular ischemia.  COGNITION: Overall cognitive status:  {cognition:24006}   SENSATION: {sensation:27233}  EDEMA:  {edema:24020}  MUSCLE TONE:  {LE tone:25568}  DTRs:  {DTR SITE:24025}  POSTURE:  {posture:25561}  Cervical ROM:    {AROM/PROM:27142} A/PROM (deg) eval  Flexion   Extension   Right lateral flexion   Left lateral flexion   Right rotation   Left rotation   (Blank rows = not tested)  STRENGTH: ***  LOWER EXTREMITY MMT:   MMT Right eval Left eval  Hip flexion    Hip abduction    Hip adduction    Hip internal rotation    Hip external rotation    Knee flexion    Knee extension    Ankle dorsiflexion    Ankle plantarflexion    Ankle inversion    Ankle eversion    (Blank rows = not tested)  BED MOBILITY:  {Bed mobility:24027}  TRANSFERS: Assistive device utilized: {Assistive devices:23999}  Sit to stand: {Levels of assistance:24026} Stand to sit: {Levels of assistance:24026} Chair to chair: {Levels of assistance:24026} Floor: {Levels of assistance:24026}  RAMP: {Levels of assistance:24026}  CURB: {Levels of assistance:24026}  GAIT: Gait pattern: {gait characteristics:25376} Distance walked: *** Assistive device utilized: {Assistive devices:23999} Level of assistance: {Levels of assistance:24026} Comments: ***  FUNCTIONAL TESTS:  {Functional tests:24029}  PATIENT SURVEYS:  {rehab surveys:24030}  VESTIBULAR ASSESSMENT:  GENERAL OBSERVATION: ***   SYMPTOM BEHAVIOR:  Subjective history: ***  Non-Vestibular symptoms: {nonvestibular symptoms:25260}  Type of dizziness: {Type of Dizziness:25255}  Frequency: ***  Duration: ***  Aggravating factors: {Aggravating Factors:25258}  Relieving factors: {Relieving Factors:25259}  Progression of symptoms: {DESC; BETTER/WORSE:18575}  OCULOMOTOR EXAM:  Ocular Alignment: {Ocular Alignment:25262}  Ocular ROM: {RANGE OF MOTION:21649}  Spontaneous Nystagmus: {Spontaneous nystagmus:25263}  Gaze-Induced Nystagmus: {gaze-induced  nystagmus:25264}  Smooth Pursuits: {smooth pursuit:25265}  Saccades: {saccades:25266}  Convergence/Divergence: *** cm   FRENZEL - FIXATION SUPRESSED:  Ocular Alignment: {Ocular Alignment:25262}  Spontaneous Nystagmus: {Spontaneous nystagmus:25263}  Gaze-Induced Nystagmus: {gaze-induced nystagmus:25264}  Horizontal head shaking - induced nystagmus: {head shaking induced nystagmus:25267}  Vertical head shaking - induced nystagmus: {head shaking induced nystagmus:25267}  Positional tests: {Positional tests:25271}  Pressure tests: {frenzel pressure tests:25268}  VESTIBULAR - OCULAR REFLEX:   Slow VOR: {slow VOR:25290}  VOR Cancellation: {vor cancellation:25291}  Head-Impulse Test: {head impulse test:25272}  Dynamic Visual Acuity: {dynamic visual acuity:25273}   POSITIONAL TESTING: {Positional tests:25271}  MOTION SENSITIVITY:  Motion Sensitivity Quotient Intensity: 0 = none, 1 = Lightheaded, 2 = Mild, 3 = Moderate, 4 = Severe, 5 = Vomiting  Intensity  1. Sitting to supine   2. Supine to L side   3. Supine to R side   4. Supine to sitting   5. L Hallpike-Dix   6. Up from L    7. R Hallpike-Dix   8. Up from R    9. Sitting, head tipped to L knee   10. Head up from L knee   11. Sitting, head tipped to R knee   12. Head up from R knee   13. Sitting head turns x5   14.Sitting head nods x5   15. In stance, 180 turn to L    16.  In stance, 180 turn to R     OTHOSTATICS: {Exam; orthostatics:31331}  FUNCTIONAL GAIT: {Functional tests:24029}   VESTIBULAR TREATMENT:                                                                                                   DATE: ***  Canalith Repositioning:  {Canalith Repositioning:25283} Gaze Adaptation:  {gaze adaptation:25286} Habituation:  {habituation:25288} Other: ***  PATIENT EDUCATION: Education details: *** Person educated: {Person educated:25204} Education method: {Education Method:25205} Education comprehension:  {Education Comprehension:25206}  HOME EXERCISE PROGRAM:  GOALS: Goals reviewed with patient? {yes/no:20286}  SHORT TERM GOALS: Target date: ***  *** Baseline: Goal status: {GOALSTATUS:25110}  2.  *** Baseline:  Goal status: {GOALSTATUS:25110}  3.  *** Baseline:  Goal status: {GOALSTATUS:25110}  4.  *** Baseline:  Goal status: {GOALSTATUS:25110}  5.  *** Baseline:  Goal status: {GOALSTATUS:25110}  6.  *** Baseline:  Goal status: {GOALSTATUS:25110}  LONG TERM GOALS: Target date: ***  *** Baseline:  Goal status: {GOALSTATUS:25110}  2.  *** Baseline:  Goal status: {GOALSTATUS:25110}  3.  *** Baseline:  Goal status: {GOALSTATUS:25110}  4.  *** Baseline:  Goal status: {GOALSTATUS:25110}  5.  *** Baseline:  Goal status: {GOALSTATUS:25110}  6.  *** Baseline:  Goal status: {GOALSTATUS:25110}  ASSESSMENT:  CLINICAL IMPRESSION: Patient is a *** y.o. *** who was seen today for physical therapy evaluation and treatment for ***.   OBJECTIVE IMPAIRMENTS: {opptimpairments:25111}.   ACTIVITY LIMITATIONS: {activitylimitations:27494}  PARTICIPATION LIMITATIONS: {participationrestrictions:25113}  PERSONAL FACTORS: {Personal factors:25162} are also affecting patient's functional outcome.   REHAB POTENTIAL: {rehabpotential:25112}  CLINICAL DECISION MAKING: {clinical decision making:25114}  EVALUATION COMPLEXITY: {Evaluation complexity:25115}   PLAN:  PT FREQUENCY: {rehab frequency:25116}  PT DURATION: {rehab duration:25117}  PLANNED INTERVENTIONS: {rehab planned interventions:25118::"Therapeutic exercises","Therapeutic activity","Neuromuscular re-education","Balance training","Gait training","Patient/Family education","Self Care","Joint mobilization"}  PLAN FOR NEXT SESSION: ***   Arliss Journey, PT, DPT  11/18/2022, 12:44 PM

## 2022-12-09 ENCOUNTER — Ambulatory Visit: Payer: Medicare Other | Admitting: Obstetrics and Gynecology
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
## 2022-12-23 ENCOUNTER — Ambulatory Visit: Payer: Medicare Other | Admitting: Podiatry

## 2022-12-26 ENCOUNTER — Ambulatory Visit (INDEPENDENT_AMBULATORY_CARE_PROVIDER_SITE_OTHER): Payer: Medicare Other | Admitting: Neurology

## 2022-12-26 ENCOUNTER — Ambulatory Visit (HOSPITAL_COMMUNITY)
Admission: EM | Admit: 2022-12-26 | Discharge: 2022-12-26 | Disposition: A | Discharge status: Home / Self Care | Payer: Medicare Other | Attending: Urgent Care | Admitting: Urgent Care

## 2022-12-26 ENCOUNTER — Other Ambulatory Visit (INDEPENDENT_AMBULATORY_CARE_PROVIDER_SITE_OTHER): Payer: Medicare Other

## 2022-12-26 ENCOUNTER — Ambulatory Visit: Payer: Medicare Other | Admitting: Podiatry

## 2022-12-26 ENCOUNTER — Encounter (HOSPITAL_COMMUNITY): Payer: Self-pay | Admitting: Emergency Medicine

## 2022-12-26 ENCOUNTER — Encounter: Payer: Self-pay | Admitting: Neurology

## 2022-12-26 VITALS — BP 164/99 | HR 77 | Ht 66.0 in | Wt 249.8 lb

## 2022-12-26 DIAGNOSIS — R519 Headache, unspecified: Secondary | ICD-10-CM

## 2022-12-26 DIAGNOSIS — E538 Deficiency of other specified B group vitamins: Secondary | ICD-10-CM

## 2022-12-26 DIAGNOSIS — S61511A Laceration without foreign body of right wrist, initial encounter: Secondary | ICD-10-CM

## 2022-12-26 DIAGNOSIS — M542 Cervicalgia: Secondary | ICD-10-CM

## 2022-12-26 DIAGNOSIS — R2681 Unsteadiness on feet: Secondary | ICD-10-CM

## 2022-12-26 DIAGNOSIS — R200 Anesthesia of skin: Secondary | ICD-10-CM

## 2022-12-26 DIAGNOSIS — M25531 Pain in right wrist: Secondary | ICD-10-CM

## 2022-12-26 DIAGNOSIS — R42 Dizziness and giddiness: Secondary | ICD-10-CM

## 2022-12-26 DIAGNOSIS — H81399 Other peripheral vertigo, unspecified ear: Secondary | ICD-10-CM

## 2022-12-26 DIAGNOSIS — G8929 Other chronic pain: Secondary | ICD-10-CM

## 2022-12-26 LAB — SEDIMENTATION RATE: Sed Rate: 47 mm/hr — ABNORMAL HIGH (ref 0–30)

## 2022-12-26 LAB — C-REACTIVE PROTEIN: CRP: <1 mg/dL (ref 0.5–20.0)

## 2022-12-26 MED ORDER — MUPIROCIN 2 % EX OINT: 1.0000 | TOPICAL_OINTMENT | Freq: Three times a day (TID) | CUTANEOUS | 0 refills | Status: DC

## 2022-12-26 NOTE — Discharge Instructions (Signed)
The pain in your wrist is secondary to the tension and stretching of the healing wound bed on your right wrist. Please wear the wrist splint to help improve the wound bed, and decrease the tension. Please topically apply antibiotic ointment to the affected area of your wrist 3 times daily until resolved.

## 2022-12-26 NOTE — Patient Instructions (Addendum)
Your B12 was low. I recommend supplementing with B12 1000 mcg daily. This can be bought over the counter at any local drug store or online.   I would like to repeat your MRI given your sudden increase in symptoms and new right sided numbness.   I will get blood work today as well.  Retry Nortriptyline 25 mg. You need to take it every night. If you have problems with the medication, give me a call.  I want you to go to vestibular rehab.  I want to see you back in 1 month.  The physicians and staff at [facility] are committed to providing excellent care. You may receive a survey requesting feedback about your experience at our office. We strive to receive "very good" responses to the survey questions. If you feel that your experience would prevent you from giving the office a "very good " response, please contact our office to try to remedy the situation. We may be reached at [phone]. Thank you for taking the time out of your busy day to complete the survey.  Kai Levins, MD [facility]  Preventing Falls at [facility] are common, often dreaded events in the lives of older people. Aside from the obvious injuries and even death that may result, fall can cause wide-ranging consequences including loss of independence, mental decline, decreased activity and mobility. Younger people are also at risk of falling, especially those with chronic illnesses and fatigue.  Ways to reduce risk for falling Examine diet and medications. Warm foods and alcohol dilate blood vessels, which can lead to dizziness when standing. Sleep aids, antidepressants and pain medications can also increase the likelihood of a fall.  Get a vision exam. Poor vision, cataracts and glaucoma increase the chances of falling.  Check foot gear. Shoes should fit snugly and have a sturdy, nonskid sole and a broad, low heel  Participate in a physician-approved exercise program to build and maintain muscle strength and  improve balance and coordination. Programs that use ankle weights or stretch bands are excellent for muscle-strengthening. Water aerobics programs and low-impact Tai Chi programs have also been shown to improve balance and coordination.  Increase vitamin D intake. Vitamin D improves muscle strength and increases the amount of calcium the body is able to absorb and deposit in bones.  How to prevent falls from common hazards Floors - Remove all loose wires, cords, and throw rugs. Minimize clutter. Make sure rugs are anchored and smooth. Keep furniture in its usual place.  Chairs -- Use chairs with straight backs, armrests and firm seats. Add firm cushions to existing pieces to add height.  Bathroom - Install grab bars and non-skid tape in the tub or shower. Use a bathtub transfer bench or a shower chair with a back support Use an elevated toilet seat and/or safety rails to assist standing from a low surface. Do not use towel racks or bathroom tissue holders to help you stand.  Lighting - Make sure halls, stairways, and entrances are well-lit. Install a night light in your bathroom or hallway. Make sure there is a light switch at the top and bottom of the staircase. Turn lights on if you get up in the middle of the night. Make sure lamps or light switches are within reach of the bed if you have to get up during the night.  Kitchen - Install non-skid rubber mats near the sink and stove. Clean spills immediately. Store frequently used utensils, pots, pans between waist and eye  level. This helps prevent reaching and bending. Sit when getting things out of lower cupboards.  Living room/ Bedrooms - Place furniture with wide spaces in between, giving enough room to move around. Establish a route through the living room that gives you something to hold onto as you walk.  Stairs - Make sure treads, rails, and rugs are secure. Install a rail on both sides of the stairs. If stairs are a threat, it might be  helpful to arrange most of your activities on the lower level to reduce the number of times you must climb the stairs.  Entrances and doorways - Install metal handles on the walls adjacent to the doorknobs of all doors to make it more secure as you travel through the doorway.  Tips for maintaining balance Keep at least one hand free at all times. Try using a backpack or fanny pack to hold things rather than carrying them in your hands. Never carry objects in both hands when walking as this interferes with keeping your balance.  Attempt to swing both arms from front to back while walking. This might require a conscious effort if Parkinson's disease has diminished your movement. It will, however, help you to maintain balance and posture, and reduce fatigue.  Consciously lift your feet off of the ground when walking. Shuffling and dragging of the feet is a common culprit in losing your balance.  When trying to navigate turns, use a "U" technique of facing forward and making a wide turn, rather than pivoting sharply.  Try to stand with your feet shoulder-length apart. When your feet are close together for any length of time, you increase your risk of losing your balance and falling.  Do one thing at a time. Don't try to walk and accomplish another task, such as reading or looking around. The decrease in your automatic reflexes complicates motor function, so the less distraction, the better.  Do not wear rubber or gripping soled shoes, they might "catch" on the floor and cause tripping.  Move slowly when changing positions. Use deliberate, concentrated movements and, if needed, use a grab bar or walking aid. Count 15 seconds between each movement. For example, when rising from a seated position, wait 15 seconds after standing to begin walking.  If balance is a continuous problem, you might want to consider a walking aid such as a cane, walking stick, or walker. Once you've mastered walking with help,  you might be ready to try it on your own again.

## 2022-12-26 NOTE — ED Triage Notes (Signed)
Stumbles at baseline, has neurology appointment after urgent care today to address this on-going issue. Stumbled Monday, hand slipped into a trashcan that had a sharp piece of broken mirror cut back of right wrist. Placed something called "wound bond" powder she's had for a long time over it and covered it with a dressing. Now the site is red, mildly swollen, and warm to the touch. No visible drainage. Does report itching.

## 2022-12-26 NOTE — ED Provider Notes (Signed)
[place]    CSN: [id number] Arrival date & time: 12/26/22  4656      History   Chief Complaint Chief Complaint  Patient presents with   Wound Check    HPI [name] is a 57 y.o. female.   57 year old female presents today due to concerns of a wound on her right dorsal wrist.  She has gait instability issues, for which she follows with neurology.  She reports she tripped on Monday evening, and cut her wrist.  She cleaned it at home with peroxide, and put some type of home wound healing salve on the wound.  Reports that it has closed since then, but is concerned because it intermittently causes pain, discharge, and is starting to get red around the wound edge.  She denies swelling to the hand or wrist.  Is updated on her tetanus within the past 1 to 2 years.   Wound Check    Past Medical History:  Diagnosis Date   Anemia    COVID-19 virus infection 11/2020   Fibroids    Heart palpitations    History of    History of blood transfusion    with knee replacement   History of colon polyps    Hypertension    Hypertension    no current meds for htn   Knee joint injury    Morbid obesity ([place])    Pneumonia    remote history   Thyroid goiter    bilateral goiter and cyst follow up every 2 years Dr. Erik Obey   Tobacco abuse     Patient Active Problem List   Diagnosis Date Noted   Breast pain, left 10/15/2022   Pelvic pain 07/31/2022   Dizziness 07/31/2022   Headache 05/30/2022   Recurrent sinusitis 11/28/2021   Routine screening for STI (sexually transmitted infection) 11/28/2021   Dysuria 11/28/2021   Tobacco abuse    COVID-19 virus infection 11/2020   Intermittent palpitations 11/17/2017   Menopause 06/23/2017   Hot flashes 09/23/2016   Healthcare maintenance 09/23/2016   S/P total knee arthroplasty, 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   Uterine fibroid 05/09/2013   Tobacco use disorder 04/12/2013   OA (osteoarthritis) 10/28/2011   Toxic multinodular goiter  09/17/2009   MORBID OBESITY 09/17/2009   Essential hypertension 09/17/2009   Pain in limb 09/17/2009   ANEMIA, HX OF 09/17/2009    Past Surgical History:  Procedure Laterality Date   ANKLE SURGERY Right 2009   broken ankle    arthroscopic surgery to knee Bilateral    BREAST BIOPSY Left 03/24/2019   CESAREAN SECTION     COLONOSCOPY     HAND SURGERY Bilateral    JOINT REPLACEMENT Bilateral    knees   REPLACEMENT TOTAL KNEE Right 2012   SHOULDER OPEN ROTATOR CUFF REPAIR Right 10/12/2019   Procedure: Right shoulder rotator cuff repair with graft and anchors;  Surgeon: Latanya Maudlin, MD;  Location: [facility];  Service: Orthopedics;  Laterality: Right;  30mn   TOTAL KNEE ARTHROPLASTY Left 01/03/2014   Procedure: LEFT TOTAL KNEE ARTHROPLASTY;  Surgeon: RTobi Bastos MD;  Location: [facility];  Service: Orthopedics;  Laterality: Left;   TUBAL LIGATION      OB History     Gravida  4   Para  2   Term  2   Preterm  0   AB  2   Living  2      SAB  1   IAB  1   Ectopic  0   Multiple      Live Births  2            Home Medications    Prior to Admission medications   Medication Sig Start Date End Date Taking? Authorizing Provider  mupirocin ointment (BACTROBAN) 2 % Apply 1 Application topically 3 (three) times daily. 12/26/22  Yes Rabia Argote L, PA  lisinopril (ZESTRIL) 5 MG tablet Take 1 tablet (5 mg total) by mouth daily. 01/10/22   Burnell Blanks, MD  metoprolol succinate (TOPROL XL) 25 MG 24 hr tablet Take 1 tablet (25 mg total) by mouth daily. May take one extra tablet as needed for heart racing 01/10/22   Burnell Blanks, MD  nortriptyline (PAMELOR) 25 MG capsule Take 1 capsule (25 mg total) by mouth at bedtime. 10/08/22 01/06/23  Shellia Carwin, MD    Family History Family History  Problem Relation Age of Onset   Hypertension Mother    Dementia Mother    Hypertension Father    Atrial fibrillation Father    Cancer Maternal Grandmother         ? lung    Social History Social History   Tobacco Use   Smoking status: Every Day    Packs/day: 0.20    Years: 20.00    Total pack years: 4.00    Types: Cigarettes   Smokeless tobacco: Never   Tobacco comments:    1-2  cigs per day/ a pack in 2 weeks 10/18  Vaping Use   Vaping Use: Never used  Substance Use Topics   Alcohol use: Yes    Alcohol/week: 2.0 standard drinks of alcohol    Types: 2 Standard drinks or equivalent per week    Comment: occasionally once per month   Drug use: No     Allergies   Tramadol, Ibuprofen, Metronidazole, and Penicillins   Review of Systems Review of Systems As per HPI  Physical Exam Triage Vital Signs ED Triage Vitals  Enc Vitals Group     BP 12/26/22 0930 (!) 155/119     Pulse Rate 12/26/22 0930 85     Resp 12/26/22 0930 16     Temp 12/26/22 0930 98 F (36.7 C)     Temp Source 12/26/22 0930 Oral     SpO2 12/26/22 0930 94 %     Weight --      Height --      Head Circumference --      Peak Flow --      Pain Score 12/26/22 0935 5     Pain Loc --      Pain Edu? --      Excl. in [place]? --    No data found.  Updated Vital Signs BP (!) 155/119 (BP Location: Left Arm)   Pulse 85   Temp 98 F (36.7 C) (Oral)   Resp 16   LMP 06/07/2016   SpO2 94%   Visual Acuity Right Eye Distance:   Left Eye Distance:   Bilateral Distance:    Right Eye Near:   Left Eye Near:    Bilateral Near:     Physical Exam Vitals and nursing note reviewed.  Constitutional:      General: She is not in acute distress.    Appearance: Normal appearance. She is obese. She is not ill-appearing or toxic-appearing.  Musculoskeletal:        General: Signs of injury (well healing laceration with loose approximation and scabbed over centrally to L  medial dorsal wrist, roughly 2cm in length) present. No swelling or tenderness. Normal range of motion.  Skin:    General: Skin is warm and dry.     Capillary Refill: Capillary refill takes less than 2  seconds.     Findings: Erythema (scant erythema without drainage immediately surrounding wound edge, without evidence of cellulitis) present. No bruising, lesion or rash.  Neurological:     General: No focal deficit present.     Mental Status: She is alert and oriented to person, place, and time.      UC Treatments / Results  Labs (all labs ordered are listed, but only abnormal results are displayed) Labs Reviewed - No data to display  EKG   Radiology No results found.  Procedures Procedures (including critical care time)  Medications Ordered in UC Medications - No data to display  Initial Impression / Assessment and Plan / UC Course  I have reviewed the triage vital signs and the nursing notes.  Pertinent labs & imaging results that were available during my care of the patient were reviewed by me and considered in my medical decision making (see chart for details).     Laceration to R wrist -this occurred 5 days ago.  It is healing well.  There may be a slight mild developing infection around the wound, but no signs of cellulitis.  Will do topical mupirocin, this will be sufficient.  Wrist pain -patient admits to pain with use of her wrist.  Will place her in a cock-up splint to help prevent excessive strain or pulling on the wound as it heals.  Patient encouraged to use this until the skin is closed.  Final Clinical Impressions(s) / UC Diagnoses   Final diagnoses:  Laceration of skin of right wrist, initial encounter  Wrist pain, acute, right     Discharge Instructions      The pain in your wrist is secondary to the tension and stretching of the healing wound bed on your right wrist. Please wear the wrist splint to help improve the wound bed, and decrease the tension. Please topically apply antibiotic ointment to the affected area of your wrist 3 times daily until resolved.     ED Prescriptions     Medication Sig Dispense Auth. Provider   mupirocin ointment  (BACTROBAN) 2 % Apply 1 Application topically 3 (three) times daily. 22 g Jari Carollo L, [place]      PDMP not reviewed this encounter.   Chaney Malling, [place] 12/26/22 [phone]

## 2022-12-31 ENCOUNTER — Ambulatory Visit: Payer: Medicare Other | Admitting: Podiatry

## 2023-01-02 ENCOUNTER — Ambulatory Visit: Payer: 59

## 2023-01-05 ENCOUNTER — Ambulatory Visit: Payer: 59 | Admitting: Physical Therapy

## 2023-01-05 ENCOUNTER — Other Ambulatory Visit (HOSPITAL_COMMUNITY): Payer: Medicare Other

## 2023-01-05 NOTE — Therapy (Deleted)
OUTPATIENT PHYSICAL THERAPY VESTIBULAR EVALUATION     Patient Name: [name] MRN: [id number] DOB:[birth year], 57 y.o., female Today's Date: 01/05/2023  END OF SESSION:   Past Medical History:  Diagnosis Date   Anemia    COVID-19 virus infection 11/2020   Fibroids    Heart palpitations    History of    History of blood transfusion    with knee replacement   History of colon polyps    Hypertension    Hypertension    no current meds for htn   Knee joint injury    Morbid obesity ([place])    Pneumonia    remote history   Thyroid goiter    bilateral goiter and cyst follow up every 2 years Dr. Erik Obey   Tobacco abuse    Past Surgical History:  Procedure Laterality Date   ANKLE SURGERY Right 2009   broken ankle    arthroscopic surgery to knee Bilateral    BREAST BIOPSY Left 03/24/2019   CESAREAN SECTION     COLONOSCOPY     HAND SURGERY Bilateral    JOINT REPLACEMENT Bilateral    knees   REPLACEMENT TOTAL KNEE Right 2012   SHOULDER OPEN ROTATOR CUFF REPAIR Right 10/12/2019   Procedure: Right shoulder rotator cuff repair with graft and anchors;  Surgeon: Latanya Maudlin, MD;  Location: [facility];  Service: Orthopedics;  Laterality: Right;  82mn   TOTAL KNEE ARTHROPLASTY Left 01/03/2014   Procedure: LEFT TOTAL KNEE ARTHROPLASTY;  Surgeon: RTobi Bastos MD;  Location: [facility];  Service: Orthopedics;  Laterality: Left;   TUBAL LIGATION     Patient Active Problem List   Diagnosis Date Noted   Breast pain, left 10/15/2022   Pelvic pain 07/31/2022   Dizziness 07/31/2022   Headache 05/30/2022   Recurrent sinusitis 11/28/2021   Routine screening for STI (sexually transmitted infection) 11/28/2021   Dysuria 11/28/2021   Tobacco abuse    COVID-19 virus infection 11/2020   Intermittent palpitations 11/17/2017   Menopause 06/23/2017   Hot flashes 09/23/2016   Healthcare maintenance 09/23/2016   S/P total knee arthroplasty, 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   Uterine fibroid 05/09/2013    Tobacco use disorder 04/12/2013   OA (osteoarthritis) 10/28/2011   Toxic multinodular goiter 09/17/2009   MORBID OBESITY 09/17/2009   Essential hypertension 09/17/2009   Pain in limb 09/17/2009   ANEMIA, HX OF 09/17/2009    PCP: HSerita Butcher MD  REFERRING PROVIDER:  HShellia Carwin MD  REFERRING DIAG: [id number](ICD-10-CM) - Chronic nonintractable headache, unspecified headache type R42 (ICD-10-CM) - Vertigo H81.399 (ICD-10-CM) - Peripheral vertigo, unspecified laterality M54.2 (ICD-10-CM) - Neck pain R26.81 (ICD-10-CM) - Gait instability E53.8 (ICD-10-CM) - B12 deficiency R20.0 (ICD-10-CM) - Right sided numbness    THERAPY DIAG:  No diagnosis found.  ONSET DATE: 12/26/2022  Rationale for Evaluation and Treatment: Rehabilitation  SUBJECTIVE:   SUBJECTIVE STATEMENT: *** Pt accompanied by: {accompnied:27141}  PERTINENT HISTORY: PMH: HTN, tocacco abuse   Per neurology note:  Patient presented to the ED on 07/24/22 for dizziness (room spinning) and headache. Symptoms were worsened with head movement. Patient was given meclizine and scopolamine. MRI brain was repeated and was similar to prior MRI brain from the end of 06/2022. Patient improved after tylenol, zofran, meclizine, and scopolamine. These helped at the time.    [facility] (Dr. BRedmond Baseman. His impression was that patient had viral labyrinthitis. Meclizine was stopped. She was to have a hearing test, but missed it  due to dizziness. She was planning to reschedule.  Her balance was about the same (stable) until Sunday, 12/21/22. When she woke up from a nap she was very off balance. She has flashing lights in her head. She has a headache that won't go away. The left side of her face is very sore. She fell on Monday and cut her right hand. Patient was in ED today because she cut her hand on glass in the trash as she stumbled.    PAIN:  Are you having pain?  {OPRCPAIN:27236}  PRECAUTIONS: {Therapy precautions:24002}  WEIGHT BEARING RESTRICTIONS: {Yes ***/No:24003}  FALLS: Has patient fallen in last 6 months? {fallsyesno:27318}  LIVING ENVIRONMENT: Lives with: {OPRC lives with:25569::"lives with their family"} Lives in: {Lives in:25570} Stairs: {opstairs:27293} Has following equipment at home: {Assistive devices:23999}  PLOF: {PLOF:24004}  PATIENT GOALS: ***  OBJECTIVE:   DIAGNOSTIC FINDINGS: ***  COGNITION: Overall cognitive status: {cognition:24006}   SENSATION: {sensation:27233}  EDEMA:  {edema:24020}  MUSCLE TONE:  {LE tone:25568}  DTRs:  {DTR SITE:24025}  POSTURE:  {posture:25561}  Cervical ROM:    {AROM/PROM:27142} A/PROM (deg) eval  Flexion   Extension   Right lateral flexion   Left lateral flexion   Right rotation   Left rotation   (Blank rows = not tested)  STRENGTH: ***  LOWER EXTREMITY MMT:   MMT Right eval Left eval  Hip flexion    Hip abduction    Hip adduction    Hip internal rotation    Hip external rotation    Knee flexion    Knee extension    Ankle dorsiflexion    Ankle plantarflexion    Ankle inversion    Ankle eversion    (Blank rows = not tested)  BED MOBILITY:  {Bed mobility:24027}  TRANSFERS: Assistive device utilized: {Assistive devices:23999}  Sit to stand: {Levels of assistance:24026} Stand to sit: {Levels of assistance:24026} Chair to chair: {Levels of assistance:24026} Floor: {Levels of assistance:24026}  RAMP: {Levels of assistance:24026}  CURB: {Levels of assistance:24026}  GAIT: Gait pattern: {gait characteristics:25376} Distance walked: *** Assistive device utilized: {Assistive devices:23999} Level of assistance: {Levels of assistance:24026} Comments: ***  FUNCTIONAL TESTS:  {Functional tests:24029}  PATIENT SURVEYS:  {rehab surveys:24030}  VESTIBULAR ASSESSMENT:  GENERAL OBSERVATION: ***   SYMPTOM BEHAVIOR:  Subjective history:  ***  Non-Vestibular symptoms: {nonvestibular symptoms:25260}  Type of dizziness: {Type of Dizziness:25255}  Frequency: ***  Duration: ***  Aggravating factors: {Aggravating Factors:25258}  Relieving factors: {Relieving Factors:25259}  Progression of symptoms: {DESC; BETTER/WORSE:18575}  OCULOMOTOR EXAM:  Ocular Alignment: {Ocular Alignment:25262}  Ocular ROM: {RANGE OF MOTION:21649}  Spontaneous Nystagmus: {Spontaneous nystagmus:25263}  Gaze-Induced Nystagmus: {gaze-induced nystagmus:25264}  Smooth Pursuits: {smooth pursuit:25265}  Saccades: {saccades:25266}  Convergence/Divergence: *** cm   FRENZEL - FIXATION SUPRESSED:  Ocular Alignment: {Ocular Alignment:25262}  Spontaneous Nystagmus: {Spontaneous nystagmus:25263}  Gaze-Induced Nystagmus: {gaze-induced nystagmus:25264}  Horizontal head shaking - induced nystagmus: {head shaking induced nystagmus:25267}  Vertical head shaking - induced nystagmus: {head shaking induced nystagmus:25267}  Positional tests: {Positional tests:25271}  Pressure tests: {frenzel pressure tests:25268}  VESTIBULAR - OCULAR REFLEX:   Slow VOR: {slow VOR:25290}  VOR Cancellation: {vor cancellation:25291}  Head-Impulse Test: {head impulse test:25272}  Dynamic Visual Acuity: {dynamic visual acuity:25273}   POSITIONAL TESTING: {Positional tests:25271}  MOTION SENSITIVITY:  Motion Sensitivity Quotient Intensity: 0 = none, 1 = Lightheaded, 2 = Mild, 3 = Moderate, 4 = Severe, 5 = Vomiting  Intensity  1. Sitting to supine   2. Supine to L side   3. Supine to R side  4. Supine to sitting   5. L Hallpike-Dix   6. Up from L    7. R Hallpike-Dix   8. Up from R    9. Sitting, head tipped to L knee   10. Head up from L knee   11. Sitting, head tipped to R knee   12. Head up from R knee   13. Sitting head turns x5   14.Sitting head nods x5   15. In stance, 180 turn to L    16. In stance, 180 turn to R     OTHOSTATICS: {Exam;  orthostatics:31331}  FUNCTIONAL GAIT: {Functional tests:24029}   VESTIBULAR TREATMENT:                                                                                                   DATE: ***  Canalith Repositioning:  {Canalith Repositioning:25283} Gaze Adaptation:  {gaze adaptation:25286} Habituation:  {habituation:25288} Other: ***  PATIENT EDUCATION: Education details: *** Person educated: {Person educated:25204} Education method: {Education Method:25205} Education comprehension: {Education Comprehension:25206}  HOME EXERCISE PROGRAM:  GOALS: Goals reviewed with patient? {yes/no:20286}  SHORT TERM GOALS: Target date: ***  *** Baseline: Goal status: {GOALSTATUS:25110}  2.  *** Baseline:  Goal status: {GOALSTATUS:25110}  3.  *** Baseline:  Goal status: {GOALSTATUS:25110}  4.  *** Baseline:  Goal status: {GOALSTATUS:25110}  5.  *** Baseline:  Goal status: {GOALSTATUS:25110}  6.  *** Baseline:  Goal status: {GOALSTATUS:25110}  LONG TERM GOALS: Target date: ***  *** Baseline:  Goal status: {GOALSTATUS:25110}  2.  *** Baseline:  Goal status: {GOALSTATUS:25110}  3.  *** Baseline:  Goal status: {GOALSTATUS:25110}  4.  *** Baseline:  Goal status: {GOALSTATUS:25110}  5.  *** Baseline:  Goal status: {GOALSTATUS:25110}  6.  *** Baseline:  Goal status: {GOALSTATUS:25110}  ASSESSMENT:  CLINICAL IMPRESSION: Patient is a *** y.o. *** who was seen today for physical therapy evaluation and treatment for ***.   OBJECTIVE IMPAIRMENTS: {opptimpairments:25111}.   ACTIVITY LIMITATIONS: {activitylimitations:27494}  PARTICIPATION LIMITATIONS: {participationrestrictions:25113}  PERSONAL FACTORS: {Personal factors:25162} are also affecting patient's functional outcome.   REHAB POTENTIAL: {rehabpotential:25112}  CLINICAL DECISION MAKING: {clinical decision making:25114}  EVALUATION COMPLEXITY: {Evaluation complexity:25115}   PLAN:  PT  FREQUENCY: {rehab frequency:25116}  PT DURATION: {rehab duration:25117}  PLANNED INTERVENTIONS: {rehab planned interventions:25118::"Therapeutic exercises","Therapeutic activity","Neuromuscular re-education","Balance training","Gait training","Patient/Family education","Self Care","Joint mobilization"}  PLAN FOR NEXT SESSION: ***   Arliss Journey, PT, DPT  01/05/2023, 7:45 AM

## 2023-01-08 NOTE — Progress Notes (Incomplete)
CC: ***  HPI:   [name] is a 57 y.o. female with a past medical history of morbid obesity, tobacco use, hypertension, and anemia who presents for routine follow-up. She was last seen at [facility] in 09-2022, which addressed breast pain and hypertension. xxx   Breast pain, left Patient presents with acute left breast pain for 1 week.  Pain is described as dull aching mostly on the lateral side with at times sharp pain.  Denies any nipple discharge, bleeding or redness. Mammogram from 2021 did not show any malignancy. Denies any family hx of breast cancer. She does endorse hx of vertigo causing her to fall. Last fall she landed on her knees and then elbow. Does not recall any injury or trauma to breasts. Breast exam did not reveal any masses but she is tender to palpation on lateral left breast. An area of skin color change on lateral side that could suggest bruising. Given acute breast pain, we will send her for mammogram and ultrasound of her breast.  Plan -Diagnostic mammogram and ultrasound of left breast - Probably benign mass in the left breast at the 3 o'clock position, Recommend six-month (04-2023) follow-up diagnostic mammography with ultrasound of the left breast. (Dr Jimmye Norman already scheduled)  Xxx Complete mammography in 04-2023    Essential hypertension BP today is under control. She is on lisinopril 5 mg daily and metoprolol 25 mg daily, which was prescribed by Dr. Angelena Form from cardiology for non-ischemic cardiomyopathy.  Plan  -continue lisinopril and metoprolol  Xxxx Home Clinic Lightheadedness, dizziness, headache, blurry vision   Neurology visit on 1-5 DIZZINESS This is [name], a 57 y.o. female with dizziness and imbalance. She continues to have left sided head and neck pain as well. Her examination is pertinent for significant imbalance. Her examination continues to point to a peripheral etiology and 2 previous MRI brain showed no central cause and CTA  head and neck showed no concern for stenosis or occlusion. However, she does have subjective right sided sensory deficit that appears different from prior. In combination with the acute onset dizziness and imbalance that occurred this week, a repeat MRI brain is reasonable to re-evaluate for a central etiology. Other possible causes include B12 deficiency, vestibular labyrinithitis vs migraine variant (given head pain). I will continue to work up as below. Of note, patient did not follow most recommendations given at last visit. We discussed safety and fall precautions.  Plan: -Blood work: ESR, CRP -MRI brain w/wo contrast - SCHEDULED FOR 2-6 NEXT MONTH -Vestibular rehab -B12 supplementation 1000 mcg daily -Will retry Nortriptyline 25 mg qhs -Fall precautions discussed Return to clinic in 1 month  Xxxx Neurology following - NEUROLOGY VISIT ON 2-16   ED visit on 1-5 WRIST WOUND The pain in your wrist is secondary to the tension and stretching of the healing wound bed on your right wrist. Please wear the wrist splint to help improve the wound bed, and decrease the tension. Please topically apply antibiotic ointment to the affected area of your wrist 3 times daily until resolved.  Xxx     Past Medical History:  Diagnosis Date   Anemia    COVID-19 virus infection 11/2020   Fibroids    Heart palpitations    History of    History of blood transfusion    with knee replacement   History of colon polyps    Hypertension    Hypertension    no current meds for htn   Knee  joint injury    Morbid obesity ([place])    Pneumonia    remote history   Thyroid goiter    bilateral goiter and cyst follow up every 2 years Dr. Erik Obey   Tobacco abuse      Review of Systems:    Reports *** Denies *** (subjective fever?, pain anywhere?, bowel changes?)   Physical Exam:  There were no vitals filed for this visit.  General:   awake and alert, sitting comfortably in chair, cooperative, not  in acute distress Skin:   warm and dry, intact without any obvious lesions or scars, no rashes or lesions  Head:   normocephalic and atraumatic, oral mucosa moist with good dentition, no lymphadenopathy Eyes:   extraocular movements intact, conjunctivae pink, pupils round and reactive to light, no periorbital swelling or scleral icterus Ears:   pinnae normal, no discharge or external lesions  Nose:   symmetrical and without mucosal inflammation, no external lesions or discharge Lungs:   normal respiratory effort, breathing unlabored, symmetrical chest rise, no crackles or wheezing Cardiac:   regular rate and rhythm, normal S1 and S2, capillary refill 2-3 seconds, dorsalis pedis pulses intact bilaterally, no pitting edema Abdomen:   soft and non-distended, normoactive bowel sounds present in all four quadrants, no guarding or palpable masses Musculoskeletal:   full range of motion in joints, motor strength 5 /5 in all four extremities, no obvious deformities or joint tenderness Neurologic:   oriented to person-place-time, moving all extremities, sensation to light touch intact, no facial droop Psychiatric:   euthymic mood with congruent affect, intelligible speech    Assessment & Plan:   No problem-specific Assessment & Plan notes found for this encounter.     See Encounters Tab for problem based charting.  Patient {GC/GE:3044014::"discussed with","seen with"} Dr. {NAMES:[id number]::"Guilloud","Hoffman","Mullen","Narendra","Williams","Vincent"}

## 2023-01-09 ENCOUNTER — Ambulatory Visit: Payer: 59 | Attending: Neurology | Admitting: Physical Therapy

## 2023-01-09 NOTE — Therapy (Incomplete)
OUTPATIENT PHYSICAL THERAPY VESTIBULAR EVALUATION     Patient Name: [name] MRN: [id number] DOB:Nov 13, 1966, 57 y.o., female Today's Date: 01/09/2023  END OF SESSION:   Past Medical History:  Diagnosis Date   Anemia    COVID-19 virus infection 11/2020   Fibroids    Heart palpitations    History of    History of blood transfusion    with knee replacement   History of colon polyps    Hypertension    Hypertension    no current meds for htn   Knee joint injury    Morbid obesity ([place])    Pneumonia    remote history   Thyroid goiter    bilateral goiter and cyst follow up every 2 years Dr. Erik Obey   Tobacco abuse    Past Surgical History:  Procedure Laterality Date   ANKLE SURGERY Right 2009   broken ankle    arthroscopic surgery to knee Bilateral    BREAST BIOPSY Left 03/24/2019   CESAREAN SECTION     COLONOSCOPY     HAND SURGERY Bilateral    JOINT REPLACEMENT Bilateral    knees   REPLACEMENT TOTAL KNEE Right 2012   SHOULDER OPEN ROTATOR CUFF REPAIR Right 10/12/2019   Procedure: Right shoulder rotator cuff repair with graft and anchors;  Surgeon: Latanya Maudlin, MD;  Location: [facility];  Service: Orthopedics;  Laterality: Right;  70mn   TOTAL KNEE ARTHROPLASTY Left 01/03/2014   Procedure: LEFT TOTAL KNEE ARTHROPLASTY;  Surgeon: RTobi Bastos MD;  Location: [facility];  Service: Orthopedics;  Laterality: Left;   TUBAL LIGATION     Patient Active Problem List   Diagnosis Date Noted   Breast pain, left 10/15/2022   Pelvic pain 07/31/2022   Dizziness 07/31/2022   Headache 05/30/2022   Recurrent sinusitis 11/28/2021   Routine screening for STI (sexually transmitted infection) 11/28/2021   Dysuria 11/28/2021   Tobacco abuse    COVID-19 virus infection 11/2020   Intermittent palpitations 11/17/2017   Menopause 06/23/2017   Hot flashes 09/23/2016   Healthcare maintenance 09/23/2016   S/P total knee arthroplasty, 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   Uterine fibroid 05/09/2013    Tobacco use disorder 04/12/2013   OA (osteoarthritis) 10/28/2011   Toxic multinodular goiter 09/17/2009   MORBID OBESITY 09/17/2009   Essential hypertension 09/17/2009   Pain in limb 09/17/2009   ANEMIA, HX OF 09/17/2009    PCP: HSerita Butcher MD  REFERRING PROVIDER:  HShellia Carwin MD  REFERRING DIAG: [id number](ICD-10-CM) - Chronic nonintractable headache, unspecified headache type R42 (ICD-10-CM) - Vertigo H81.399 (ICD-10-CM) - Peripheral vertigo, unspecified laterality M54.2 (ICD-10-CM) - Neck pain R26.81 (ICD-10-CM) - Gait instability E53.8 (ICD-10-CM) - B12 deficiency R20.0 (ICD-10-CM) - Right sided numbness    THERAPY DIAG:  No diagnosis found.  ONSET DATE: 12/26/2022  Rationale for Evaluation and Treatment: Rehabilitation  SUBJECTIVE:   SUBJECTIVE STATEMENT: *** Pt accompanied by: {accompnied:27141}  PERTINENT HISTORY: PMH: HTN, tocacco abuse, bilat knee replacement   Per neurology note: Frequency and intensity of headache increased around 04/2022 and then involved the top of the head, left eye, and back of head. There is associated nausea, palpitations, and blurry vision. She has flashes of light and black spots in her vision. She denies photophobia, focal weakness or sensory changes. MRI brain, ESR, and CRP was ordered. ESR and CRP were within normal limits. MRI brain showed an empty sella but otherwise unremarkable   Patient presented to the ED on 07/24/22 for dizziness (room spinning) and  headache. Symptoms were worsened with head movement. Patient was given meclizine and scopolamine. MRI brain was repeated and was similar to prior MRI brain from the end of 06/2022. Patient improved after tylenol, zofran, meclizine, and scopolamine. These helped at the time.    Patient saw ENT at [facility] on 08/11/22 (Dr. Redmond Baseman). His impression was that patient had viral labyrinthitis. Meclizine was stopped. She was to have a hearing test, but missed it due to dizziness.     PAIN:  Are you having pain? {OPRCPAIN:27236}  PRECAUTIONS: {Therapy precautions:24002}  WEIGHT BEARING RESTRICTIONS: {Yes ***/No:24003}  FALLS: Has patient fallen in last 6 months? {fallsyesno:27318}  LIVING ENVIRONMENT: Lives with: {OPRC lives with:25569::"lives with their family"} Lives in: {Lives in:25570} Stairs: {opstairs:27293} Has following equipment at home: {Assistive devices:23999}  PLOF: {PLOF:24004}  PATIENT GOALS: ***  OBJECTIVE:   DIAGNOSTIC FINDINGS: MRI brain 07/24/22:  IMPRESSION: No acute abnormality. Mild white matter changes consistent with chronic microvascular ischemia.  COGNITION: Overall cognitive status: {cognition:24006}   SENSATION: {sensation:27233}  EDEMA:  {edema:24020}  MUSCLE TONE:  {LE tone:25568}  DTRs:  {DTR SITE:24025}  POSTURE:  {posture:25561}  Cervical ROM:    {AROM/PROM:27142} A/PROM (deg) eval  Flexion   Extension   Right lateral flexion   Left lateral flexion   Right rotation   Left rotation   (Blank rows = not tested)  STRENGTH: ***  LOWER EXTREMITY MMT:   MMT Right eval Left eval  Hip flexion    Hip abduction    Hip adduction    Hip internal rotation    Hip external rotation    Knee flexion    Knee extension    Ankle dorsiflexion    Ankle plantarflexion    Ankle inversion    Ankle eversion    (Blank rows = not tested)  BED MOBILITY:  {Bed mobility:24027}  TRANSFERS: Assistive device utilized: {Assistive devices:23999}  Sit to stand: {Levels of assistance:24026} Stand to sit: {Levels of assistance:24026} Chair to chair: {Levels of assistance:24026} Floor: {Levels of assistance:24026}  RAMP: {Levels of assistance:24026}  CURB: {Levels of assistance:24026}  GAIT: Gait pattern: {gait characteristics:25376} Distance walked: *** Assistive device utilized: {Assistive devices:23999} Level of assistance: {Levels of assistance:24026} Comments: ***  FUNCTIONAL TESTS:  {Functional  tests:24029}  PATIENT SURVEYS:  {rehab surveys:24030}  VESTIBULAR ASSESSMENT:  GENERAL OBSERVATION: ***   SYMPTOM BEHAVIOR:  Subjective history: ***  Non-Vestibular symptoms: {nonvestibular symptoms:25260}  Type of dizziness: {Type of Dizziness:25255}  Frequency: ***  Duration: ***  Aggravating factors: {Aggravating Factors:25258}  Relieving factors: {Relieving Factors:25259}  Progression of symptoms: {DESC; BETTER/WORSE:18575}  OCULOMOTOR EXAM:  Ocular Alignment: {Ocular Alignment:25262}  Ocular ROM: {RANGE OF MOTION:21649}  Spontaneous Nystagmus: {Spontaneous nystagmus:25263}  Gaze-Induced Nystagmus: {gaze-induced nystagmus:25264}  Smooth Pursuits: {smooth pursuit:25265}  Saccades: {saccades:25266}  Convergence/Divergence: *** cm   FRENZEL - FIXATION SUPRESSED:  Ocular Alignment: {Ocular Alignment:25262}  Spontaneous Nystagmus: {Spontaneous nystagmus:25263}  Gaze-Induced Nystagmus: {gaze-induced nystagmus:25264}  Horizontal head shaking - induced nystagmus: {head shaking induced nystagmus:25267}  Vertical head shaking - induced nystagmus: {head shaking induced nystagmus:25267}  Positional tests: {Positional tests:25271}  Pressure tests: {frenzel pressure tests:25268}  VESTIBULAR - OCULAR REFLEX:   Slow VOR: {slow VOR:25290}  VOR Cancellation: {vor cancellation:25291}  Head-Impulse Test: {head impulse test:25272}  Dynamic Visual Acuity: {dynamic visual acuity:25273}   POSITIONAL TESTING: {Positional tests:25271}  MOTION SENSITIVITY:  Motion Sensitivity Quotient Intensity: 0 = none, 1 = Lightheaded, 2 = Mild, 3 = Moderate, 4 = Severe, 5 = Vomiting  Intensity  1. Sitting to supine  2. Supine to L side   3. Supine to R side   4. Supine to sitting   5. L Hallpike-Dix   6. Up from L    7. R Hallpike-Dix   8. Up from R    9. Sitting, head tipped to L knee   10. Head up from L knee   11. Sitting, head tipped to R knee   12. Head up from R knee   13. Sitting  head turns x5   14.Sitting head nods x5   15. In stance, 180 turn to L    16. In stance, 180 turn to R     OTHOSTATICS: {Exam; orthostatics:31331}  FUNCTIONAL GAIT: {Functional tests:24029}   VESTIBULAR TREATMENT:                                                                                                   DATE: ***  Canalith Repositioning:  {Canalith Repositioning:25283} Gaze Adaptation:  {gaze adaptation:25286} Habituation:  {habituation:25288} Other: ***  PATIENT EDUCATION: Education details: *** Person educated: {Person educated:25204} Education method: {Education Method:25205} Education comprehension: {Education Comprehension:25206}  HOME EXERCISE PROGRAM:  GOALS: Goals reviewed with patient? {yes/no:20286}  SHORT TERM GOALS: Target date: ***  *** Baseline: Goal status: {GOALSTATUS:25110}  2.  *** Baseline:  Goal status: {GOALSTATUS:25110}  3.  *** Baseline:  Goal status: {GOALSTATUS:25110}  4.  *** Baseline:  Goal status: {GOALSTATUS:25110}  5.  *** Baseline:  Goal status: {GOALSTATUS:25110}  6.  *** Baseline:  Goal status: {GOALSTATUS:25110}  LONG TERM GOALS: Target date: ***  *** Baseline:  Goal status: {GOALSTATUS:25110}  2.  *** Baseline:  Goal status: {GOALSTATUS:25110}  3.  *** Baseline:  Goal status: {GOALSTATUS:25110}  4.  *** Baseline:  Goal status: {GOALSTATUS:25110}  5.  *** Baseline:  Goal status: {GOALSTATUS:25110}  6.  *** Baseline:  Goal status: {GOALSTATUS:25110}  ASSESSMENT:  CLINICAL IMPRESSION: Patient is a *** y.o. *** who was seen today for physical therapy evaluation and treatment for ***.   OBJECTIVE IMPAIRMENTS: {opptimpairments:25111}.   ACTIVITY LIMITATIONS: {activitylimitations:27494}  PARTICIPATION LIMITATIONS: {participationrestrictions:25113}  PERSONAL FACTORS: {Personal factors:25162} are also affecting patient's functional outcome.   REHAB POTENTIAL:  {rehabpotential:25112}  CLINICAL DECISION MAKING: {clinical decision making:25114}  EVALUATION COMPLEXITY: {Evaluation complexity:25115}   PLAN:  PT FREQUENCY: {rehab frequency:25116}  PT DURATION: {rehab duration:25117}  PLANNED INTERVENTIONS: {rehab planned interventions:25118::"Therapeutic exercises","Therapeutic activity","Neuromuscular re-education","Balance training","Gait training","Patient/Family education","Self Care","Joint mobilization"}  PLAN FOR NEXT SESSION: ***   Arliss Journey, PT, DPT  01/09/2023, 8:26 AM

## 2023-01-12 ENCOUNTER — Encounter: Payer: Self-pay | Admitting: Podiatry

## 2023-01-12 ENCOUNTER — Ambulatory Visit (INDEPENDENT_AMBULATORY_CARE_PROVIDER_SITE_OTHER): Payer: 59

## 2023-01-12 ENCOUNTER — Telehealth: Payer: Self-pay | Admitting: Neurology

## 2023-01-12 ENCOUNTER — Ambulatory Visit (INDEPENDENT_AMBULATORY_CARE_PROVIDER_SITE_OTHER): Payer: 59 | Admitting: Podiatry

## 2023-01-12 DIAGNOSIS — M7751 Other enthesopathy of right foot: Secondary | ICD-10-CM

## 2023-01-12 DIAGNOSIS — M79671 Pain in right foot: Secondary | ICD-10-CM | POA: Diagnosis not present

## 2023-01-12 MED ORDER — GABAPENTIN 300 MG PO CAPS: 300.0000 mg | ORAL_CAPSULE | Freq: Three times a day (TID) | ORAL | 3 refills | Status: DC

## 2023-01-12 MED ORDER — TRIAMCINOLONE ACETONIDE 10 MG/ML IJ SUSP
10.0000 mg | Freq: Once | INTRAMUSCULAR | Status: AC
  Administered 2023-01-12: 10 mg

## 2023-01-12 NOTE — Telephone Encounter (Signed)
cALLED pT  and she was told that DR. Hill would have to do another referral since she canceled the last one 4 times and they canceled her order. I will get it okay by Dr. Berdine Addison before going forward.

## 2023-01-12 NOTE — Telephone Encounter (Signed)
Patient missed her appt with the rehabilitation and now she needs a new referral sent to them. Patient wants a call back

## 2023-01-12 NOTE — Telephone Encounter (Signed)
Patient is calling about a referral to the rehabilitation  please call

## 2023-01-13 ENCOUNTER — Telehealth: Payer: Self-pay

## 2023-01-13 ENCOUNTER — Other Ambulatory Visit: Payer: Self-pay | Admitting: Podiatry

## 2023-01-13 DIAGNOSIS — M79671 Pain in right foot: Secondary | ICD-10-CM

## 2023-01-13 DIAGNOSIS — M7751 Other enthesopathy of right foot: Secondary | ICD-10-CM

## 2023-01-13 NOTE — Telephone Encounter (Signed)
Resent PT to [facility]

## 2023-01-13 NOTE — Telephone Encounter (Signed)
Resent to [facility]

## 2023-01-14 ENCOUNTER — Ambulatory Visit: Payer: Medicare Other

## 2023-01-14 ENCOUNTER — Encounter: Payer: Medicare Other | Admitting: Student

## 2023-01-14 NOTE — Progress Notes (Signed)
Subjective:   Patient ID: [name], female   DOB: 57 y.o.   MRN: [id number]   HPI Patient presents stating having a lot of pain in the right ankle of 6 months duration.  Patient states the pain is increased recently and also states it seems to affect the entire foot at times.  Complains also of swelling and tingling with some stiffness of the toe.  Patient smokes slightly and tries to be active   Review of Systems  All other systems reviewed and are negative.       Objective:  Physical Exam Vitals and nursing note reviewed.  Constitutional:      Appearance: She is well-developed.  Pulmonary:     Effort: Pulmonary effort is normal.  Musculoskeletal:        General: Normal range of motion.  Skin:    General: Skin is warm.  Neurological:     Mental Status: She is alert.     Neurovascular status found to be intact muscle strength was found to be adequate range of motion adequate with inflammation pain of the sinus tarsi right with fluid buildup moderate discomfort in that foot in general but appears to be more related to inflammation deep within the sinus tarsi.  Patient is found to have good digital perfusion well-oriented x 3 with moderate obesity and swelling with negative Bevelyn Buckles' sign     Assessment:  Inflammatory capsulitis sinus tarsi right with inflammation fluid buildup along with mild edema and structural issues     Plan:  H&P x-rays reviewed and we will get a focus on the sinus tarsi which appears to be the most inflamed area.  I did go ahead today I did sterile prep injected the sinus tarsi right 3 mg Kenalog 5 mg Xylocaine to advised on compression elevation and reappoint as symptoms indicate  X-rays indicate that there is moderate arthritis but I did not note stress fracture did not note acute injury condition

## 2023-01-21 NOTE — Progress Notes (Incomplete)
CC: ***  HPI:   [name] is a 57 y.o. female with a past medical history of morbid obesity, hypertension, tobacco use, and anemia who presents for routine follow-up. She was last seen at [facility] in 09-2022.    DIZZINESS This is [name], a 57 y.o. female with dizziness and imbalance. She continues to have left sided head and neck pain as well. Her examination is pertinent for significant imbalance. Her examination continues to point to a peripheral etiology and 2 previous MRI brain showed no central cause and CTA head and neck showed no concern for stenosis or occlusion. However, she does have subjective right sided sensory deficit that appears different from prior. In combination with the acute onset dizziness and imbalance that occurred this week, a repeat MRI brain is reasonable to re-evaluate for a central etiology. Other possible causes include B12 deficiency, vestibular labyrinithitis vs migraine variant (given head pain). I will continue to work up as below. Of note, patient did not follow most recommendations given at last visit. We discussed safety and fall precautions.  Plan: -Blood work: ESR, CRP - ESR 47 and CRP WNL -MRI brain w/wo contrast -Vestibular rehab -B12 supplementation 1000 mcg daily -Will retry Nortriptyline 25 mg qhs -Fall precautions discussed  X Dizziness Tried vestibular rehab? Vitamin B12 supplementation? Nortriptyline helpful ? Brain MRI scheduled 2-6 Neuro follow-up scheduled 2-16     Breast pain, left Patient presents with acute left breast pain for 1 week.  Pain is described as dull aching mostly on the lateral side with at times sharp pain.  Denies any nipple discharge, bleeding or redness. Mammogram from 2021 did not show any malignancy. Denies any family hx of breast cancer. She does endorse hx of vertigo causing her to fall. Last fall she landed on her knees and then elbow. Does not recall any injury or trauma to breasts. Breast exam  did not reveal any masses but she is tender to palpation on lateral left breast. An area of skin color change on lateral side that could suggest bruising. Given acute breast pain, we will send her for mammogram and ultrasound of her breast.  Plan -Diagnostic mammogram and ultrasound of left breast  MAMMO RESULTS Probably benign mass in the left breast at the 3 o'clock position. - repeat in six months recommended - already scheduled for 03-2023  Xxx Breast pain still? ObGyn follow-up scheduled 2-12 Repeat imaging scheduled for 03-2023     Essential hypertension BP today is under control. She is on lisinopril 5 mg daily and metoprolol 25 mg daily, which was prescribed by Dr. Angelena Form from cardiology for non-ischemic cardiomyopathy.  Plan  -continue lisinopril and metoprolol  Xxx Taking medications Adherence good per chart Home bp Clinic bp lightheadedness, dizziness, blurry vision, headache, anxiety, chest pain, palpitations  We should check A1C, no record in chart ever!      Past Medical History:  Diagnosis Date   Anemia    COVID-19 virus infection 11/2020   Fibroids    Heart palpitations    History of    History of blood transfusion    with knee replacement   History of colon polyps    Hypertension    Hypertension    no current meds for htn   Knee joint injury    Morbid obesity ([place])    Pneumonia    remote history   Thyroid goiter    bilateral goiter and cyst follow up every 2 years Dr. Erik Obey  Tobacco abuse      Review of Systems:    Reports *** Denies *** (subjective fever?, pain anywhere?, bowel changes?)   Physical Exam:  There were no vitals filed for this visit.  General:   awake and alert, sitting comfortably in chair, cooperative, not in acute distress Skin:   warm and dry, intact without any obvious lesions or scars, no rashes Head:   normocephalic and atraumatic, oral mucosa moist with good dentition, no lymphadenopathy Eyes:   extraocular  movements intact, conjunctivae pink, pupils round and reactive to light, no periorbital swelling or scleral icterus Ears:   pinnae normal, no discharge or external lesions  Nose:   symmetrical and without mucosal inflammation, no external lesions or discharge Lungs:   normal respiratory effort, breathing unlabored, symmetrical chest rise, no crackles or wheezing Cardiac:   regular rate and rhythm, normal S1 and S2, capillary refill 2-3 seconds, dorsalis pedis pulses intact bilaterally, no pitting edema Abdomen:   soft and non-distended, normoactive bowel sounds present in all four quadrants, no guarding or palpable masses Musculoskeletal:   full range of motion in joints, motor strength 5 /5 in all four extremities, no obvious deformities or joint tenderness Neurologic:   oriented to person-place-time, moving all extremities, sensation to light touch intact, no gross focal deficits Psychiatric:   euthymic mood with congruent affect, intelligible speech    Assessment & Plan:   No problem-specific Assessment & Plan notes found for this encounter.     See Encounters Tab for problem based charting.  Patient {GC/GE:3044014::"discussed with","seen with"} Dr. {NAMES:[id number]::"Guilloud","Hoffman","Mullen","Narendra","Williams","Vincent"}

## 2023-01-27 ENCOUNTER — Ambulatory Visit
Admission: RE | Admit: 2023-01-27 | Discharge: 2023-01-27 | Disposition: A | Discharge status: Home / Self Care | Payer: 59 | Source: Ambulatory Visit | Attending: Neurology | Admitting: Neurology

## 2023-01-27 ENCOUNTER — Ambulatory Visit: Payer: 59

## 2023-01-27 ENCOUNTER — Encounter: Payer: Medicare Other | Admitting: Student

## 2023-01-27 DIAGNOSIS — E538 Deficiency of other specified B group vitamins: Secondary | ICD-10-CM

## 2023-01-27 DIAGNOSIS — R2681 Unsteadiness on feet: Secondary | ICD-10-CM

## 2023-01-27 DIAGNOSIS — R42 Dizziness and giddiness: Secondary | ICD-10-CM | POA: Diagnosis not present

## 2023-01-27 DIAGNOSIS — R200 Anesthesia of skin: Secondary | ICD-10-CM

## 2023-01-27 DIAGNOSIS — M542 Cervicalgia: Secondary | ICD-10-CM

## 2023-01-27 DIAGNOSIS — H81399 Other peripheral vertigo, unspecified ear: Secondary | ICD-10-CM

## 2023-01-27 DIAGNOSIS — G8929 Other chronic pain: Secondary | ICD-10-CM

## 2023-01-27 MED ORDER — GADOPICLENOL 0.5 MMOL/ML IV SOLN
10.0000 mL | Freq: Once | INTRAVENOUS | Status: AC | PRN
  Administered 2023-01-27: 10 mL via INTRAVENOUS

## 2023-01-28 ENCOUNTER — Other Ambulatory Visit: Payer: Self-pay

## 2023-01-29 ENCOUNTER — Ambulatory Visit (HOSPITAL_COMMUNITY): Payer: 59 | Attending: Cardiovascular Disease

## 2023-01-29 DIAGNOSIS — F17200 Nicotine dependence, unspecified, uncomplicated: Secondary | ICD-10-CM | POA: Diagnosis not present

## 2023-01-29 DIAGNOSIS — I428 Other cardiomyopathies: Secondary | ICD-10-CM

## 2023-01-29 DIAGNOSIS — M79671 Pain in right foot: Secondary | ICD-10-CM | POA: Diagnosis not present

## 2023-01-29 DIAGNOSIS — M25571 Pain in right ankle and joints of right foot: Secondary | ICD-10-CM | POA: Diagnosis not present

## 2023-01-29 DIAGNOSIS — I340 Nonrheumatic mitral (valve) insufficiency: Secondary | ICD-10-CM

## 2023-01-29 DIAGNOSIS — M67961 Unspecified disorder of synovium and tendon, right lower leg: Secondary | ICD-10-CM | POA: Diagnosis not present

## 2023-01-29 DIAGNOSIS — I471 Supraventricular tachycardia, unspecified: Secondary | ICD-10-CM | POA: Diagnosis not present

## 2023-01-29 DIAGNOSIS — R002 Palpitations: Secondary | ICD-10-CM

## 2023-01-29 LAB — ECHOCARDIOGRAM COMPLETE
Area-P 1/2: 2.81 cm2
MV M vel: 5.76 m/s
MV Peak grad: 132.5 mmHg
Radius: 0.7 cm
S' Lateral: 4.1 cm

## 2023-02-02 ENCOUNTER — Ambulatory Visit: Payer: 59 | Admitting: Obstetrics and Gynecology
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
## 2023-02-04 ENCOUNTER — Encounter: Payer: Self-pay | Admitting: Cardiovascular Disease

## 2023-02-04 ENCOUNTER — Ambulatory Visit: Payer: 59 | Attending: Cardiovascular Disease | Admitting: Cardiovascular Disease

## 2023-02-04 VITALS — BP 128/90 | HR 77 | Ht 66.0 in | Wt 257.0 lb

## 2023-02-04 DIAGNOSIS — R079 Chest pain, unspecified: Secondary | ICD-10-CM

## 2023-02-04 DIAGNOSIS — I340 Nonrheumatic mitral (valve) insufficiency: Secondary | ICD-10-CM

## 2023-02-04 DIAGNOSIS — I471 Supraventricular tachycardia, unspecified: Secondary | ICD-10-CM | POA: Diagnosis not present

## 2023-02-04 DIAGNOSIS — Z01812 Encounter for preprocedural laboratory examination: Secondary | ICD-10-CM | POA: Diagnosis not present

## 2023-02-04 DIAGNOSIS — R002 Palpitations: Secondary | ICD-10-CM | POA: Diagnosis not present

## 2023-02-04 DIAGNOSIS — I428 Other cardiomyopathies: Secondary | ICD-10-CM | POA: Diagnosis not present

## 2023-02-04 NOTE — H&P (View-Only) (Signed)
 Chief Complaint  Patient presents with   Follow-up    Mitral regurgitation   History of Present Illness: 56 yo female with history of anemia, HTN, obesity and tobacco abuse who is here today for follow up. I saw her as a new patient for the evaluation of palpitations in December 2022. She was seen in the [facility] 11/13/21 with palpitations and chest tightness that occurred after climbing stairs. She was restarted on atenolol but she had not started this yet when I saw her.  She had recurrent palpitations the day before her visit here that lasted for several seconds. She has had some dyspnea but no chest pain. Echo January 2023 with low normal LV systolic function, LVEF=50%. No regional wall motion abnormalities. Moderate mitral regurgitation. Cardiac monitor with sinus, 2 beat runs of VT and 9 runs of SVT, longest 16 seconds. Echo 01/29/23 with LVEF around 45-50% and moderate to severe MR.   She is here today for follow up. She is having dyspnea at rest and with exertion. Sharp chest pains. The patient denies any palpitations, lower extremity edema, orthopnea, PND, dizziness, near syncope or syncope. She continues to smoke.   Primary Care Physician: Harper, Daniel, MD   Past Medical History:  Diagnosis Date   Anemia    COVID-19 virus infection 11/2020   Fibroids    Heart palpitations    History of    History of blood transfusion    with knee replacement   History of colon polyps    Hypertension    Hypertension    no current meds for htn   Knee joint injury    Morbid obesity (HCC)    Pneumonia    remote history   Thyroid goiter    bilateral goiter and cyst follow up every 2 years Dr. Wolicki   Tobacco abuse     Past Surgical History:  Procedure Laterality Date   ANKLE SURGERY Right 2009   broken ankle    arthroscopic surgery to knee Bilateral    BREAST BIOPSY Left 03/24/2019   CESAREAN SECTION     COLONOSCOPY     HAND SURGERY Bilateral    JOINT REPLACEMENT  Bilateral    knees   REPLACEMENT TOTAL KNEE Right 2012   SHOULDER OPEN ROTATOR CUFF REPAIR Right 10/12/2019   Procedure: Right shoulder rotator cuff repair with graft and anchors;  Surgeon: Gioffre, Ronald, MD;  Location: [facility];  Service: Orthopedics;  Laterality: Right;  90min   TOTAL KNEE ARTHROPLASTY Left 01/03/2014   Procedure: LEFT TOTAL KNEE ARTHROPLASTY;  Surgeon: Ronald A Gioffre, MD;  Location: [facility];  Service: Orthopedics;  Laterality: Left;   TUBAL LIGATION      Current Outpatient Medications  Medication Sig Dispense Refill   gabapentin (NEURONTIN) 300 MG capsule Take 1 capsule (300 mg total) by mouth 3 (three) times daily. 90 capsule 3   lisinopril (ZESTRIL) 5 MG tablet Take 1 tablet (5 mg total) by mouth daily. 90 tablet 3   metoprolol succinate (TOPROL XL) 25 MG 24 hr tablet Take 1 tablet (25 mg total) by mouth daily. May take one extra tablet as needed for heart racing 90 tablet 3   mupirocin ointment (BACTROBAN) 2 % Apply 1 Application topically 3 (three) times daily. 22 g 0   nortriptyline (PAMELOR) 25 MG capsule Take 1 capsule (25 mg total) by mouth at bedtime. 90 capsule 0   No current facility-administered medications for this visit.    Allergies  Allergen Reactions     Tramadol Itching   Ibuprofen Nausea And Vomiting   Metronidazole Itching and Nausea And Vomiting   Penicillins Itching    Denies hives, or airway issues.     Social History   Socioeconomic History   Marital status: Single    Spouse name: Not on file   Number of children: 2   Years of education: Not on file   Highest education level: Not on file  Occupational History   Occupation: disabled    Employer: DISABLED   Tobacco Use   Smoking status: Every Day    Packs/day: 0.20    Years: 20.00    Total pack years: 4.00    Types: Cigarettes   Smokeless tobacco: Never   Tobacco comments:    1-2  cigs per day/ a pack in 2 weeks 10/18  Vaping Use   Vaping Use: Never used  Substance and Sexual  Activity   Alcohol use: Yes    Alcohol/week: 2.0 standard drinks of alcohol    Types: 2 Standard drinks or equivalent per week    Comment: occasionally once per month   Drug use: No   Sexual activity: Yes    Birth control/protection: Surgical  Other Topics Concern   Not on file  Social History Narrative   ** Merged History Encounter ** Applying for disability, previously worked in restaurant   Right handed   Caffeine rarely   Live in two story    Social Determinants of Health   Financial Resource Strain: Not on file  Food Insecurity: Not on file  Transportation Needs: Not on file  Physical Activity: Not on file  Stress: Not on file  Social Connections: Not on file  Intimate Partner Violence: Not on file    Family History  Problem Relation Age of Onset   Hypertension Mother    Dementia Mother    Hypertension Father    Atrial fibrillation Father    Cancer Maternal Grandmother        ? lung    Review of Systems:  As stated in the HPI and otherwise negative.   BP (!) 128/90   Pulse 77   Ht 5' 6" (1.676 m)   Wt 116.6 kg   LMP 06/07/2016   SpO2 100%   BMI 41.48 kg/m   Physical Examination:  General: Well developed, well nourished, NAD  HEENT: OP clear, mucus membranes moist  SKIN: warm, dry. No rashes. Neuro: No focal deficits  Musculoskeletal: Muscle strength 5/5 all ext  Psychiatric: Mood and affect normal  Neck: No JVD, no carotid bruits, no thyromegaly, no lymphadenopathy.  Lungs:Clear bilaterally, no wheezes, rhonci, crackles Cardiovascular: Regular rate and rhythm. Systolic murmur.  Abdomen:Soft. Bowel sounds present. Non-tender.  Extremities: No lower extremity edema. Pulses are 2 + in the bilateral DP/PT.  EKG:  EKG is ordered today. The ekg ordered today demonstrates NSR  Echo 01/29/23:  1. Global hypokinesis worse in the posterior basal segment EF similar to  TTE done 01/01/22 . Left ventricular ejection fraction, by estimation, is  45 to 50%. The  left ventricle has mildly decreased function. The left  ventricle demonstrates global  hypokinesis. The left ventricular internal cavity size was moderately  dilated. There is mild left ventricular hypertrophy. Left ventricular  diastolic parameters were normal.   2. Right ventricular systolic function is normal. The right ventricular  size is normal. There is normal pulmonary artery systolic pressure.   3. Left atrial size was moderately dilated.   4. With splay artifact likely   funcitonal from reduced EF and atrial  enlargement MR appears worse than TTE done 01/01/22 consider further w/u  including right / left cath and TEE if clinically indicated . The mitral  valve is abnormal. Moderate to severe  mitral valve regurgitation. No evidence of mitral stenosis.   5. The aortic valve is tricuspid. Aortic valve regurgitation is not  visualized. No aortic stenosis is present.   6. The inferior vena cava is normal in size with greater than 50%  respiratory variability, suggesting right atrial pressure of 3 mmHg.   Recent Labs: 07/24/2022: ALT 14; BUN 15; Creatinine, Ser 1.13; Hemoglobin 11.8; Platelets 213; Potassium 4.1; Sodium 138   Lipid Panel    Component Value Date/Time   CHOL 113 10/18/2009 2100   TRIG 36 10/18/2009 2100   HDL 54 10/18/2009 2100   CHOLHDL 2.1 Ratio 10/18/2009 2100   VLDL 7 10/18/2009 2100   LDLCALC 52 10/18/2009 2100     Wt Readings from Last 3 Encounters:  02/04/23 116.6 kg  12/26/22 113.3 kg  10/15/22 114.3 kg    Assessment and Plan:   1. Palpitations/SVT: Rare palpitations. Continue Toprol.    2. Mitral regurgitation: Moderate to severe by echo February 2024. Will plan a TEE to fully evaluate the mitral valve.  Based on the lack of available slots, her TEE will have to be in mid March. Will arrange a right and left heart cath at [facility] on 02/12/23 at 7:30 am.  I have reviewed the risks, indications, and alternatives to cardiac catheterization, possible  angioplasty, and stenting with the patient. Risks include but are not limited to bleeding, infection, vascular injury, stroke, myocardial infection, arrhythmia, kidney injury, radiation-related injury in the case of prolonged fluoroscopy use, emergency cardiac surgery, and death. The patient understands the risks of serious complication is 1-2 in 1000 with diagnostic cardiac cath and 1-2% or less with angioplasty/stenting. -BMET and CBC today  3. Non-ischemic cardiomyopathy: Low normal LV function with LVEF=45-50% February 2024. Continue Lisinopril and Toprol.   4. Chest pain: Cardiac cath as above.   Labs/ tests ordered today include:  No orders of the defined types were placed in this encounter.  Disposition:   F/U with me one year  Signed, Giovan Pinsky, MD 02/04/2023 2:40 PM    [facility] [address], [place], [place]  [address] Phone: [phone]; Fax: [phone]   

## 2023-02-04 NOTE — Progress Notes (Signed)
Chief Complaint  Patient presents with   Follow-up    Mitral regurgitation   History of Present Illness: 57 yo female with history of anemia, HTN, obesity and tobacco abuse who is here today for follow up. I saw her as a new patient for the evaluation of palpitations in December 2022. She was seen in the [facility] 11/13/21 with palpitations and chest tightness that occurred after climbing stairs. She was restarted on atenolol but she had not started this yet when I saw her.  She had recurrent palpitations the day before her visit here that lasted for several seconds. She has had some dyspnea but no chest pain. Echo January 2023 with low normal LV systolic function, 99991111. No regional wall motion abnormalities. Moderate mitral regurgitation. Cardiac monitor with sinus, 2 beat runs of VT and 9 runs of SVT, longest 16 seconds. Echo 01/29/23 with LVEF around 45-50% and moderate to severe MR.   She is here today for follow up. She is having dyspnea at rest and with exertion. Sharp chest pains. The patient denies any palpitations, lower extremity edema, orthopnea, PND, dizziness, near syncope or syncope. She continues to smoke.   Primary Care Physician: Serita Butcher, MD   Past Medical History:  Diagnosis Date   Anemia    COVID-19 virus infection 11/2020   Fibroids    Heart palpitations    History of    History of blood transfusion    with knee replacement   History of colon polyps    Hypertension    Hypertension    no current meds for htn   Knee joint injury    Morbid obesity ([place])    Pneumonia    remote history   Thyroid goiter    bilateral goiter and cyst follow up every 2 years Dr. Erik Obey   Tobacco abuse     Past Surgical History:  Procedure Laterality Date   ANKLE SURGERY Right 2009   broken ankle    arthroscopic surgery to knee Bilateral    BREAST BIOPSY Left 03/24/2019   CESAREAN SECTION     COLONOSCOPY     HAND SURGERY Bilateral    JOINT REPLACEMENT  Bilateral    knees   REPLACEMENT TOTAL KNEE Right 2012   SHOULDER OPEN ROTATOR CUFF REPAIR Right 10/12/2019   Procedure: Right shoulder rotator cuff repair with graft and anchors;  Surgeon: Latanya Maudlin, MD;  Location: [facility];  Service: Orthopedics;  Laterality: Right;  82mn   TOTAL KNEE ARTHROPLASTY Left 01/03/2014   Procedure: LEFT TOTAL KNEE ARTHROPLASTY;  Surgeon: RTobi Bastos MD;  Location: [facility];  Service: Orthopedics;  Laterality: Left;   TUBAL LIGATION      Current Outpatient Medications  Medication Sig Dispense Refill   gabapentin (NEURONTIN) 300 MG capsule Take 1 capsule (300 mg total) by mouth 3 (three) times daily. 90 capsule 3   lisinopril (ZESTRIL) 5 MG tablet Take 1 tablet (5 mg total) by mouth daily. 90 tablet 3   metoprolol succinate (TOPROL XL) 25 MG 24 hr tablet Take 1 tablet (25 mg total) by mouth daily. May take one extra tablet as needed for heart racing 90 tablet 3   mupirocin ointment (BACTROBAN) 2 % Apply 1 Application topically 3 (three) times daily. 22 g 0   nortriptyline (PAMELOR) 25 MG capsule Take 1 capsule (25 mg total) by mouth at bedtime. 90 capsule 0   No current facility-administered medications for this visit.    Allergies  Allergen Reactions  Tramadol Itching   Ibuprofen Nausea And Vomiting   Metronidazole Itching and Nausea And Vomiting   Penicillins Itching    Denies hives, or airway issues.     Social History   Socioeconomic History   Marital status: Single    Spouse name: Not on file   Number of children: 2   Years of education: Not on file   Highest education level: Not on file  Occupational History   Occupation: disabled    Employer: DISABLED   Tobacco Use   Smoking status: Every Day    Packs/day: 0.20    Years: 20.00    Total pack years: 4.00    Types: Cigarettes   Smokeless tobacco: Never   Tobacco comments:    1-2  cigs per day/ a pack in 2 weeks 10/18  Vaping Use   Vaping Use: Never used  Substance and Sexual  Activity   Alcohol use: Yes    Alcohol/week: 2.0 standard drinks of alcohol    Types: 2 Standard drinks or equivalent per week    Comment: occasionally once per month   Drug use: No   Sexual activity: Yes    Birth control/protection: Surgical  Other Topics Concern   Not on file  Social History Narrative   ** Merged History Encounter ** Applying for disability, previously worked in Chirop[employer]   Right handed   Caffeine rarely   Live in two story    Social Determinants of Health   Financial Resource Strain: Not on file  Food Insecurity: Not on file  Transportation Needs: Not on file  Physical Activity: Not on file  Stress: Not on file  Social Connections: Not on file  Intimate Partner Violence: Not on file    Family History  Problem Relation Age of Onset   Hypertension Mother    Dementia Mother    Hypertension Father    Atrial fibrillation Father    Cancer Maternal Grandmother        ? lung    Review of Systems:  As stated in the HPI and otherwise negative.   BP (!) 128/90   Pulse 77   Ht 5' 6"$  (1.676 m)   Wt 116.6 kg   LMP 06/07/2016   SpO2 100%   BMI 41.48 kg/m   Physical Examination:  General: Well developed, well nourished, NAD  HEENT: OP clear, mucus membranes moist  SKIN: warm, dry. No rashes. Neuro: No focal deficits  Musculoskeletal: Muscle strength 5/5 all ext  Psychiatric: Mood and affect normal  Neck: No JVD, no carotid bruits, no thyromegaly, no lymphadenopathy.  Lungs:Clear bilaterally, no wheezes, rhonci, crackles Cardiovascular: Regular rate and rhythm. Systolic murmur.  Abdomen:Soft. Bowel sounds present. Non-tender.  Extremities: No lower extremity edema. Pulses are 2 + in the bilateral DP/PT.  EKG:  EKG is ordered today. The ekg ordered today demonstrates NSR  Echo 01/29/23:  1. Global hypokinesis worse in the posterior basal segment EF similar to  TTE done 01/01/22 . Left ventricular ejection fraction, by estimation, is  45 to 50%. The  left ventricle has mildly decreased function. The left  ventricle demonstrates global  hypokinesis. The left ventricular internal cavity size was moderately  dilated. There is mild left ventricular hypertrophy. Left ventricular  diastolic parameters were normal.   2. Right ventricular systolic function is normal. The right ventricular  size is normal. There is normal pulmonary artery systolic pressure.   3. Left atrial size was moderately dilated.   4. With splay artifact likely  funcitonal from reduced EF and atrial  enlargement MR appears worse than TTE done 01/01/22 consider further w/u  including right / left cath and TEE if clinically indicated . The mitral  valve is abnormal. Moderate to severe  mitral valve regurgitation. No evidence of mitral stenosis.   5. The aortic valve is tricuspid. Aortic valve regurgitation is not  visualized. No aortic stenosis is present.   6. The inferior vena cava is normal in size with greater than 50%  respiratory variability, suggesting right atrial pressure of 3 mmHg.   Recent Labs: 07/24/2022: ALT 14; BUN 15; Creatinine, Ser 1.13; Hemoglobin 11.8; Platelets 213; Potassium 4.1; Sodium 138   Lipid Panel    Component Value Date/Time   CHOL 113 10/18/2009 2100   TRIG 36 10/18/2009 2100   HDL 54 10/18/2009 2100   CHOLHDL 2.1 Ratio 10/18/2009 2100   VLDL 7 10/18/2009 2100   LDLCALC 52 10/18/2009 2100     Wt Readings from Last 3 Encounters:  02/04/23 116.6 kg  12/26/22 113.3 kg  10/15/22 114.3 kg    Assessment and Plan:   1. Palpitations/SVT: Rare palpitations. Continue Toprol.    2. Mitral regurgitation: Moderate to severe by echo February 2024. Will plan a TEE to fully evaluate the mitral valve.  Based on the lack of available slots, her TEE will have to be in mid March. Will arrange a right and left heart cath at [facility] on 02/12/23 at 7:30 am.  I have reviewed the risks, indications, and alternatives to cardiac catheterization, possible  angioplasty, and stenting with the patient. Risks include but are not limited to bleeding, infection, vascular injury, stroke, myocardial infection, arrhythmia, kidney injury, radiation-related injury in the case of prolonged fluoroscopy use, emergency cardiac surgery, and death. The patient understands the risks of serious complication is 1-2 in [id number] with diagnostic cardiac cath and 1-2% or less with angioplasty/stenting. -BMET and CBC today  3. Non-ischemic cardiomyopathy: Low normal LV function with LVEF=45-50% February 2024. Continue Lisinopril and Toprol.   4. Chest pain: Cardiac cath as above.   Labs/ tests ordered today include:  No orders of the defined types were placed in this encounter.  Disposition:   F/U with me one year  Signed, Lauree Chandler, MD 02/04/2023 2:40 PM    [facility] [place], [place], [place]  [address] Phone: [phone]; Fax: [phone]

## 2023-02-04 NOTE — Patient Instructions (Signed)
Medication Instructions:  No changes *If you need a refill on your cardiac medications before your next appointment, please call your pharmacy*   Lab Work: Today: cbc, bmet    Testing/Procedures: Your physician has requested that you have a cardiac catheterization. Cardiac catheterization is used to diagnose and/or treat various heart conditions. Doctors may recommend this procedure for a number of different reasons. The most common reason is to evaluate chest pain. Chest pain can be a symptom of coronary artery disease (CAD), and cardiac catheterization can show whether plaque is narrowing or blocking your heart's arteries. This procedure is also used to evaluate the valves, as well as measure the blood flow and oxygen levels in different parts of your heart. For further information please visit [email or web address]. Please follow instruction sheet, as given.  Your physician has requested that you have a TEE. During a TEE, sound waves are used to create images of your heart. It provides your doctor with information about the size and shape of your heart and how well your heart's chambers and valves are working. In this test, a transducer is attached to the end of a flexible tube that's guided down your throat and into your esophagus (the tube leading from you mouth to your stomach) to get a more detailed image of your heart. You are not awake for the procedure. Please see the instruction sheet given to you today. For further information please visit [email or web address].    Follow-Up: At [facility], you and your health needs are our priority.  As part of our continuing mission to provide you with exceptional heart care, we have created designated Provider Care Teams.  These Care Teams include your primary Cardiologist (physician) and Advanced Practice Providers (APPs -  Physician Assistants and Nurse Practitioners) who all work together to provide you with the care you need, when you need  it.  We recommend signing up for the patient portal called "MyChart".  Sign up information is provided on this After Visit Summary.  MyChart is used to connect with patients for Virtual Visits (Telemedicine).  Patients are able to view lab/test results, encounter notes, upcoming appointments, etc.  Non-urgent messages can be sent to your provider as well.   To learn more about what you can do with MyChart, go to [email or web address].    Your next appointment:   3 month(s)  Provider:   Lauree Chandler, MD            Cardiac/Peripheral Catheterization   You are scheduled for a Cardiac Catheterization on Thursday, February 22 with Dr. Lauree Chandler.  1. Please arrive at the Main Entrance A at [facility]: [place], [place] [address] on February 22 at 5:30 AM (This time is two hours before your procedure to ensure your preparation). Free valet parking service is available. You will check in at ADMITTING. The support person will be asked to wait in the waiting room.  It is OK to have someone drop you off and come back when you are ready to be discharged.        Special note: Every effort is made to have your procedure done on time. Please understand that emergencies sometimes delay scheduled procedures.   . 2. Diet: Do not eat solid foods after midnight.  You may have clear liquids until 5 AM the day of the procedure.  3. Labs: You will need to have blood drawn today. You do not need to be fasting.  4. Medication instructions in  preparation for your procedure:   Contrast Allergy: No   On the morning of your procedure, take Aspirin 81 mg and any morning medicines NOT listed above.  You may use sips of water.  5. Plan to go home the same day, you will only stay overnight if medically necessary. 6. You MUST have a responsible adult to drive you home. 7. An adult MUST be with you the first 24 hours after you arrive home. 8. Bring a current list of  your medications, and the last time and date medication taken. 9. Bring ID and current insurance cards. 10.Please wear clothes that are easy to get on and off and wear slip-on shoes.  Thank you for allowing [place] to care for you!   -- [facility]  TEE INSTRUCTIONS: You are scheduled for a TEE on Thursday March 05, 2023 with Dr. Audie Box.  Please arrive at the [facility] (Main Entrance A) at [facility]: [place], [place] [address] at 6:00 am. You are allowed ONE visitor in the waiting room during your procedure. Both you and your guest must wear masks.  DIET: Nothing to eat or drink after midnight except a sip of water with medications.  MEDICATION INSTRUCTIONS:  1) You may take your other medications as directed with sips of water, or wait until you return home if you choose  LABS:  will be completed the morning of procedure  You must have a responsible person to drive you home and stay in the waiting area during your procedure. Failure to do so could result in cancellation.  Bring your insurance cards.  *Special Note: Every effort is made to have your procedure done on time. Occasionally there are emergencies that occur at the hospital that may cause delays. Please be patient if a delay does occur.

## 2023-02-05 ENCOUNTER — Telehealth: Payer: Self-pay | Admitting: Cardiovascular Disease

## 2023-02-05 LAB — BASIC METABOLIC PANEL
BUN/Creatinine Ratio: 21 (ref 9–23)
BUN: 21 mg/dL (ref 6–24)
CO2: 24 mmol/L (ref 20–29)
Calcium: 9.2 mg/dL (ref 8.7–10.2)
Chloride: 108 mmol/L — ABNORMAL HIGH (ref 96–106)
Creatinine, Ser: 1 mg/dL (ref 0.57–1.00)
Glucose: 87 mg/dL (ref 70–99)
Potassium: 4.4 mmol/L (ref 3.5–5.2)
Sodium: 144 mmol/L (ref 134–144)
eGFR: 66 mL/min/{1.73_m2} (ref >59)

## 2023-02-05 LAB — CBC
Hematocrit: 34.4 % (ref 34.0–46.6)
Hemoglobin: 11.2 g/dL (ref 11.1–15.9)
MCH: 30.9 pg (ref 26.6–33.0)
MCHC: 32.6 g/dL (ref 31.5–35.7)
MCV: 95 fL (ref 79–97)
Platelets: 178 10*3/uL (ref 150–450)
RBC: 3.63 x10E6/uL — ABNORMAL LOW (ref 3.77–5.28)
RDW: 15.8 % — ABNORMAL HIGH (ref 11.7–15.4)
WBC: 5.2 10*3/uL (ref 3.4–10.8)

## 2023-02-05 MED ORDER — FUROSEMIDE 40 MG PO TABS: 40.0000 mg | ORAL_TABLET | Freq: Every day | ORAL | 1 refills | Status: DC

## 2023-02-05 NOTE — Telephone Encounter (Signed)
Patient stated around 4 am this morning she experienced sever shortness of breath and tachycardia. She did take Metoprolol and her heart rate did decrease. Her shortness of breath has improved a little however, when she lays flat shortness of breath increases. Patient did not seek medical attention and stated she does not want to go to the ED. She will like to know if there is something the MD can prescribe to help with her symptoms. Will forward to MD and nurse.

## 2023-02-05 NOTE — Telephone Encounter (Signed)
Called patient and advised per Dr. Angelena Form:  Let's start Lasix 40 mg daily and see if this helps. She should eat a banana every day. Gerald Stabs    She will pick up and start tomorrow.  Will eat banana daily.  Will let MD know if lasix not helpful.

## 2023-02-05 NOTE — Telephone Encounter (Signed)
Pt c/o Shortness Of Breath: STAT if SOB developed within the last 24 hours or pt is noticeably SOB on the phone  1. Are you currently SOB (can you hear that pt is SOB on the phone)? no  2. How long have you been experiencing SOB? Yes   3. Are you SOB when sitting or when up moving around? Laying down  4. Are you currently experiencing any other symptoms? no

## 2023-02-06 ENCOUNTER — Encounter: Payer: Self-pay | Admitting: Neurology

## 2023-02-06 ENCOUNTER — Ambulatory Visit (INDEPENDENT_AMBULATORY_CARE_PROVIDER_SITE_OTHER): Payer: 59 | Admitting: Neurology

## 2023-02-06 VITALS — BP 171/123 | HR 71 | Ht 66.0 in | Wt 257.0 lb

## 2023-02-06 DIAGNOSIS — M542 Cervicalgia: Secondary | ICD-10-CM | POA: Diagnosis not present

## 2023-02-06 DIAGNOSIS — I428 Other cardiomyopathies: Secondary | ICD-10-CM

## 2023-02-06 DIAGNOSIS — R200 Anesthesia of skin: Secondary | ICD-10-CM | POA: Diagnosis not present

## 2023-02-06 DIAGNOSIS — E538 Deficiency of other specified B group vitamins: Secondary | ICD-10-CM | POA: Diagnosis not present

## 2023-02-06 DIAGNOSIS — R42 Dizziness and giddiness: Secondary | ICD-10-CM | POA: Diagnosis not present

## 2023-02-06 DIAGNOSIS — R519 Headache, unspecified: Secondary | ICD-10-CM | POA: Diagnosis not present

## 2023-02-06 DIAGNOSIS — R2681 Unsteadiness on feet: Secondary | ICD-10-CM

## 2023-02-06 DIAGNOSIS — I340 Nonrheumatic mitral (valve) insufficiency: Secondary | ICD-10-CM | POA: Diagnosis not present

## 2023-02-06 DIAGNOSIS — H81399 Other peripheral vertigo, unspecified ear: Secondary | ICD-10-CM

## 2023-02-06 DIAGNOSIS — G8929 Other chronic pain: Secondary | ICD-10-CM | POA: Diagnosis not present

## 2023-02-06 MED ORDER — TOPIRAMATE 25 MG PO TABS: 50.0000 mg | ORAL_TABLET | Freq: Every day | ORAL | 5 refills | Status: DC

## 2023-02-06 NOTE — Patient Instructions (Signed)
We are stopping your Nortriptyline today.  We will start Topamax for headaches. You will take 25 mg for 1 week, then increase to 50 mg (2 pills) thereafter if you are not having side effects.  Let me know if you are having problems with your medicine or it is not working for your headaches.  I recommend [place] or BioFreeze for your neck pain. This will help with imbalance and headaches if your neck pain is improved.  Follow up with cardiology as planned.  I would like to see you back in 3 months or sooner if needed. Please let me know if you have any questions or concerns in the meantime.   The physicians and staff at [facility] are committed to providing excellent care. You may receive a survey requesting feedback about your experience at our office. We strive to receive "very good" responses to the survey questions. If you feel that your experience would prevent you from giving the office a "very good " response, please contact our office to try to remedy the situation. We may be reached at [phone]. Thank you for taking the time out of your busy day to complete the survey.  Kai Levins, MD [facility]  Preventing Falls at [facility] are common, often dreaded events in the lives of older people. Aside from the obvious injuries and even death that may result, fall can cause wide-ranging consequences including loss of independence, mental decline, decreased activity and mobility. Younger people are also at risk of falling, especially those with chronic illnesses and fatigue.  Ways to reduce risk for falling Examine diet and medications. Warm foods and alcohol dilate blood vessels, which can lead to dizziness when standing. Sleep aids, antidepressants and pain medications can also increase the likelihood of a fall.  Get a vision exam. Poor vision, cataracts and glaucoma increase the chances of falling.  Check foot gear. Shoes should fit snugly and have a sturdy, nonskid  sole and a broad, low heel  Participate in a physician-approved exercise program to build and maintain muscle strength and improve balance and coordination. Programs that use ankle weights or stretch bands are excellent for muscle-strengthening. Water aerobics programs and low-impact Tai Chi programs have also been shown to improve balance and coordination.  Increase vitamin D intake. Vitamin D improves muscle strength and increases the amount of calcium the body is able to absorb and deposit in bones.  How to prevent falls from common hazards Floors - Remove all loose wires, cords, and throw rugs. Minimize clutter. Make sure rugs are anchored and smooth. Keep furniture in its usual place.  Chairs -- Use chairs with straight backs, armrests and firm seats. Add firm cushions to existing pieces to add height.  Bathroom - Install grab bars and non-skid tape in the tub or shower. Use a bathtub transfer bench or a shower chair with a back support Use an elevated toilet seat and/or safety rails to assist standing from a low surface. Do not use towel racks or bathroom tissue holders to help you stand.  Lighting - Make sure halls, stairways, and entrances are well-lit. Install a night light in your bathroom or hallway. Make sure there is a light switch at the top and bottom of the staircase. Turn lights on if you get up in the middle of the night. Make sure lamps or light switches are within reach of the bed if you have to get up during the night.  Kitchen - Install non-skid Engineer, structural  mats near the sink and stove. Clean spills immediately. Store frequently used utensils, pots, pans between waist and eye level. This helps prevent reaching and bending. Sit when getting things out of lower cupboards.  Living room/ Bedrooms - Place furniture with wide spaces in between, giving enough room to move around. Establish a route through the living room that gives you something to hold onto as you walk.  Stairs - Make  sure treads, rails, and rugs are secure. Install a rail on both sides of the stairs. If stairs are a threat, it might be helpful to arrange most of your activities on the lower level to reduce the number of times you must climb the stairs.  Entrances and doorways - Install metal handles on the walls adjacent to the doorknobs of all doors to make it more secure as you travel through the doorway.  Tips for maintaining balance Keep at least one hand free at all times. Try using a backpack or fanny pack to hold things rather than carrying them in your hands. Never carry objects in both hands when walking as this interferes with keeping your balance.  Attempt to swing both arms from front to back while walking. This might require a conscious effort if Parkinson's disease has diminished your movement. It will, however, help you to maintain balance and posture, and reduce fatigue.  Consciously lift your feet off of the ground when walking. Shuffling and dragging of the feet is a common culprit in losing your balance.  When trying to navigate turns, use a "U" technique of facing forward and making a wide turn, rather than pivoting sharply.  Try to stand with your feet shoulder-length apart. When your feet are close together for any length of time, you increase your risk of losing your balance and falling.  Do one thing at a time. Don't try to walk and accomplish another task, such as reading or looking around. The decrease in your automatic reflexes complicates motor function, so the less distraction, the better.  Do not wear rubber or gripping soled shoes, they might "catch" on the floor and cause tripping.  Move slowly when changing positions. Use deliberate, concentrated movements and, if needed, use a grab bar or walking aid. Count 15 seconds between each movement. For example, when rising from a seated position, wait 15 seconds after standing to begin walking.  If balance is a continuous problem,  you might want to consider a walking aid such as a cane, walking stick, or walker. Once you've mastered walking with help, you might be ready to try it on your own again.

## 2023-02-06 NOTE — Addendum Note (Signed)
Addended by: Renae Gloss on: 02/06/2023 03:30 PM   Modules accepted: Orders

## 2023-02-10 ENCOUNTER — Telehealth: Payer: Self-pay | Admitting: Cardiovascular Disease

## 2023-02-10 NOTE — Telephone Encounter (Signed)
Called the patient back.  Reviewed that she was given a work note for the weekend after the cath 2/23 and 2/24.  Pt only works on weekends.  She states her work has told her she needs to be out for 2 weeks and won't allow her to return until after that to be sure that she is 100 percent.     The cath is 2/22.  She feels she may be more dizzy than she already is because of getting anesthesia during cath.  Tried to reassure her that her symptoms will not worsen because of procedure.  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  Pt aware that I will forward to Dr. Angelena Form for review and then we will call her back.    Pt also voiced her work has [employer] for her time off and they are sending forms to our office to be completed.  I was letting her know about the office policy of signing for the ROI and the $29 fee.  She said I was getting on her nerves and about to make her lose her job.  She told me to just ask the doctor if he can give the note for the 2 weeks off and then hung up on me.

## 2023-02-10 NOTE — Telephone Encounter (Signed)
Patient called stating she needs a note for work that she will be out for two weeks or how every long she needs to be out after her procedure.  She said she needs to take the note to her job by tomorrow.

## 2023-02-10 NOTE — Telephone Encounter (Signed)
Called patient back and let her know that we can do her work note for Feb 24/25 and Mar 2/3.  She will come by the office to pick up tomorrow.

## 2023-02-11 ENCOUNTER — Encounter: Payer: Self-pay | Admitting: *Deleted

## 2023-02-11 ENCOUNTER — Telehealth: Payer: Self-pay | Admitting: *Deleted

## 2023-02-11 NOTE — Telephone Encounter (Signed)
Cardiac Catheterization scheduled at [facility] for: Thursday February 12, 2023 9 AM Arrival time and place: [facility] Main Entrance A at: 7 AM  Nothing to eat after midnight prior to procedure, clear liquids until 5 AM day of procedure.  Medication instructions: -Hold:  Lasix-AM of procedure -Other usual morning medications can be taken with sips of water including aspirin 81 mg.  Confirmed patient has responsible adult to drive home post procedure and be with patient first 24 hours after arriving home.  Patient reports no new symptoms concerning for COVID-19 in the past 10 days.  Reviewed procedure instructions with patient.

## 2023-02-11 NOTE — Telephone Encounter (Signed)
Letter placed at front desk for patient pick up.

## 2023-02-13 ENCOUNTER — Telehealth: Payer: Self-pay | Admitting: Cardiovascular Disease

## 2023-02-13 NOTE — Telephone Encounter (Signed)
I spoke with patient and informed her that we do not bill [employer] for the forms completion; that she would have to pay that fee to [place] prior to completed the form and sending it to [employer].  Patient said that she would come in to pay and sign the release sometime next week.

## 2023-02-13 NOTE — Telephone Encounter (Signed)
Patient states she spoke with her insurance about the for medical release form and informed them that the office charges $29 for it. She says her insurance already sent paperwork to the office and requests the office fax them an invoice. She says their fax number is on the paperwork.

## 2023-02-16 ENCOUNTER — Telehealth: Payer: Self-pay | Admitting: Cardiovascular Disease

## 2023-02-16 NOTE — Telephone Encounter (Signed)
Called patient to let her know that her FMLA form from [employer] was here.  I informed patient on 02/10/23 to come and pay and sign release.  Called patient on 02/13/23 who said that he would come in to sign/pay the week of 02/16/23.

## 2023-02-20 DIAGNOSIS — Z0279 Encounter for issue of other medical certificate: Secondary | ICD-10-CM

## 2023-02-23 NOTE — Telephone Encounter (Signed)
Patient paid for her form completion. Form placed in Dr. Camillia Herter box.

## 2023-02-24 NOTE — Telephone Encounter (Signed)
Pt would like call back to discuss update on forms.

## 2023-03-03 ENCOUNTER — Telehealth: Payer: Self-pay | Admitting: *Deleted

## 2023-03-03 NOTE — Telephone Encounter (Signed)
FMLA forms completed and signed.  Placed back at front desk for processing.

## 2023-03-03 NOTE — Telephone Encounter (Signed)
Cardiac Catheterization scheduled at [facility] for: Thursday March 05, 2023 9 AM/TEE 7:30 AM Arrival time [facility] Main Entrance A at: 6:30 AM  Nothing to eat or drink after midnight prior to procedures, may have water to take medications.  Medication instructions: -Hold:  Lasix-AM of procedures-pt reports she is currently not taking. -Other usual morning medications can be taken with sips of water including aspirin 81 mg.  Confirmed patient has responsible adult to drive home post procedure and be with patient first 24 hours after arriving home.  Plan to go home the same day, you will only stay overnight if medically necessary.  Reviewed procedure instructions with patient.

## 2023-03-04 ENCOUNTER — Ambulatory Visit: Payer: 59 | Admitting: Family Medicine

## 2023-03-04 ENCOUNTER — Encounter: Payer: 59 | Admitting: Student

## 2023-03-04 NOTE — Anesthesia Preprocedure Evaluation (Signed)
Anesthesia Evaluation  Patient identified by MRN, date of birth, ID band Patient awake    Reviewed: Allergy & Precautions, NPO status , Patient's Chart, lab work & pertinent test results, reviewed documented beta blocker date and time   History of Anesthesia Complications Negative for: history of anesthetic complications  Airway Mallampati: II  TM Distance: >3 FB Neck ROM: Full    Dental  (+) Missing,    Pulmonary Current Smoker and Patient abstained from smoking.   Pulmonary exam normal        Cardiovascular hypertension, Pt. on medications and Pt. on home beta blockers Normal cardiovascular exam  TTE 01/29/23: Global hypokinesis worse in the posterior basal segment EF similar to  TTE done 01/01/22, EF 45-50%, global hypokinesis, moderate LVE, mild LVH, moderate LAE, moderate to severe MR      Neuro/Psych  Headaches  negative psych ROS   GI/Hepatic negative GI ROS, Neg liver ROS,,,  Endo/Other  negative endocrine ROS    Renal/GU negative Renal ROS  negative genitourinary   Musculoskeletal negative musculoskeletal ROS (+)    Abdominal   Peds  Hematology negative hematology ROS (+)   Anesthesia Other Findings Day of surgery medications reviewed with patient.  Reproductive/Obstetrics negative OB ROS                              Anesthesia Physical Anesthesia Plan  ASA: 3  Anesthesia Plan: General and MAC   Post-op Pain Management: Minimal or no pain anticipated   Induction:   PONV Risk Score and Plan: Treatment may vary due to age or medical condition and Propofol infusion  Airway Management Planned: Natural Airway and Nasal Cannula  Additional Equipment: None  Intra-op Plan:   Post-operative Plan:   Informed Consent: I have reviewed the patients History and Physical, chart, labs and discussed the procedure including the risks, benefits and alternatives for the proposed  anesthesia with the patient or authorized representative who has indicated his/her understanding and acceptance.       Plan Discussed with: CRNA  Anesthesia Plan Comments:         Anesthesia Quick Evaluation

## 2023-03-05 ENCOUNTER — Ambulatory Visit (HOSPITAL_BASED_OUTPATIENT_CLINIC_OR_DEPARTMENT_OTHER): Payer: 59

## 2023-03-05 ENCOUNTER — Ambulatory Visit (HOSPITAL_COMMUNITY): Admission: RE | Admit: 2023-03-05 | Payer: 59 | Source: Ambulatory Visit | Admitting: Interventional Cardiology

## 2023-03-05 ENCOUNTER — Encounter (HOSPITAL_COMMUNITY)
Admission: RE | Disposition: A | Discharge status: Home / Self Care | Payer: Self-pay | Source: Home / Self Care | Attending: Cardiovascular Disease

## 2023-03-05 ENCOUNTER — Ambulatory Visit (HOSPITAL_COMMUNITY)
Admission: RE | Admit: 2023-03-05 | Discharge: 2023-03-05 | Disposition: A | Discharge status: Home / Self Care | Payer: 59 | Attending: Cardiovascular Disease | Admitting: Cardiovascular Disease

## 2023-03-05 ENCOUNTER — Other Ambulatory Visit: Payer: Self-pay

## 2023-03-05 ENCOUNTER — Ambulatory Visit (HOSPITAL_COMMUNITY): Payer: 59 | Admitting: Anesthesiology

## 2023-03-05 ENCOUNTER — Ambulatory Visit (HOSPITAL_BASED_OUTPATIENT_CLINIC_OR_DEPARTMENT_OTHER): Payer: 59 | Admitting: Anesthesiology

## 2023-03-05 ENCOUNTER — Encounter (HOSPITAL_COMMUNITY): Payer: Self-pay | Admitting: Cardiovascular Disease

## 2023-03-05 DIAGNOSIS — R079 Chest pain, unspecified: Secondary | ICD-10-CM | POA: Insufficient documentation

## 2023-03-05 DIAGNOSIS — I428 Other cardiomyopathies: Secondary | ICD-10-CM | POA: Diagnosis not present

## 2023-03-05 DIAGNOSIS — I340 Nonrheumatic mitral (valve) insufficiency: Secondary | ICD-10-CM

## 2023-03-05 DIAGNOSIS — I10 Essential (primary) hypertension: Secondary | ICD-10-CM

## 2023-03-05 DIAGNOSIS — I272 Pulmonary hypertension, unspecified: Secondary | ICD-10-CM | POA: Diagnosis not present

## 2023-03-05 DIAGNOSIS — Z79899 Other long term (current) drug therapy: Secondary | ICD-10-CM | POA: Insufficient documentation

## 2023-03-05 DIAGNOSIS — R002 Palpitations: Secondary | ICD-10-CM | POA: Diagnosis not present

## 2023-03-05 DIAGNOSIS — I471 Supraventricular tachycardia, unspecified: Secondary | ICD-10-CM | POA: Diagnosis not present

## 2023-03-05 DIAGNOSIS — F17210 Nicotine dependence, cigarettes, uncomplicated: Secondary | ICD-10-CM

## 2023-03-05 DIAGNOSIS — I088 Other rheumatic multiple valve diseases: Secondary | ICD-10-CM | POA: Diagnosis not present

## 2023-03-05 DIAGNOSIS — Q2112 Patent foramen ovale: Secondary | ICD-10-CM

## 2023-03-05 DIAGNOSIS — I700 Atherosclerosis of aorta: Secondary | ICD-10-CM | POA: Diagnosis not present

## 2023-03-05 DIAGNOSIS — F17200 Nicotine dependence, unspecified, uncomplicated: Secondary | ICD-10-CM | POA: Diagnosis not present

## 2023-03-05 HISTORY — PX: TEE WITHOUT CARDIOVERSION: SHX5443

## 2023-03-05 HISTORY — PX: RIGHT/LEFT HEART CATH AND CORONARY ANGIOGRAPHY: CATH118266

## 2023-03-05 HISTORY — PX: ABDOMINAL AORTOGRAM: CATH118222

## 2023-03-05 HISTORY — PX: BUBBLE STUDY: SHX6837

## 2023-03-05 LAB — POCT I-STAT EG7
Acid-base deficit: 1 mmol/L (ref 0.0–2.0)
Acid-base deficit: 1 mmol/L (ref 0.0–2.0)
Bicarbonate: 25.2 mmol/L (ref 20.0–28.0)
Bicarbonate: 25.8 mmol/L (ref 20.0–28.0)
Calcium, Ion: 1.26 mmol/L (ref 1.15–1.40)
Calcium, Ion: 1.27 mmol/L (ref 1.15–1.40)
HCT: 34 % — ABNORMAL LOW (ref 36.0–46.0)
HCT: 34 % — ABNORMAL LOW (ref 36.0–46.0)
Hemoglobin: 11.6 g/dL — ABNORMAL LOW (ref 12.0–15.0)
Hemoglobin: 11.6 g/dL — ABNORMAL LOW (ref 12.0–15.0)
O2 Saturation: 66 %
O2 Saturation: 64 %
Potassium: 4 mmol/L (ref 3.5–5.1)
Potassium: 4 mmol/L (ref 3.5–5.1)
Sodium: 140 mmol/L (ref 135–145)
Sodium: 140 mmol/L (ref 135–145)
TCO2: 27 mmol/L (ref 22–32)
TCO2: 27 mmol/L (ref 22–32)
pCO2, Ven: 48.5 mmHg (ref 44–60)
pCO2, Ven: 49.3 mmHg (ref 44–60)
pH, Ven: 7.323 (ref 7.25–7.43)
pH, Ven: 7.327 (ref 7.25–7.43)
pO2, Ven: 38 mmHg (ref 32–45)
pO2, Ven: 36 mmHg (ref 32–45)

## 2023-03-05 LAB — POCT I-STAT 7, (LYTES, BLD GAS, ICA,H+H)
Acid-base deficit: 2 mmol/L (ref 0.0–2.0)
Bicarbonate: 24.2 mmol/L (ref 20.0–28.0)
Calcium, Ion: 1.24 mmol/L (ref 1.15–1.40)
HCT: 34 % — ABNORMAL LOW (ref 36.0–46.0)
Hemoglobin: 11.6 g/dL — ABNORMAL LOW (ref 12.0–15.0)
O2 Saturation: 95 %
Potassium: 4 mmol/L (ref 3.5–5.1)
Sodium: 139 mmol/L (ref 135–145)
TCO2: 26 mmol/L (ref 22–32)
pCO2 arterial: 44.4 mmHg (ref 32–48)
pH, Arterial: 7.344 — ABNORMAL LOW (ref 7.35–7.45)
pO2, Arterial: 80 mmHg — ABNORMAL LOW (ref 83–108)

## 2023-03-05 LAB — ECHO TEE
Area-P 1/2: 3.12 cm2
MV M vel: 6.02 m/s
MV Peak grad: 145 mmHg
Radius: 0.8 cm

## 2023-03-05 SURGERY — RIGHT/LEFT HEART CATH AND CORONARY ANGIOGRAPHY
Anesthesia: LOCAL

## 2023-03-05 SURGERY — ECHOCARDIOGRAM, TRANSESOPHAGEAL
Anesthesia: Monitor Anesthesia Care

## 2023-03-05 MED ORDER — ASPIRIN 81 MG PO CHEW: 81.0000 mg | CHEWABLE_TABLET | ORAL | Status: DC

## 2023-03-05 MED ORDER — LIDOCAINE HCL (PF) 1 % IJ SOLN
INTRAMUSCULAR | Status: AC
  Filled 2023-03-05: qty 30

## 2023-03-05 MED ORDER — FENTANYL CITRATE (PF) 100 MCG/2ML IJ SOLN
INTRAMUSCULAR | Status: DC | PRN
  Administered 2023-03-05 (×2): 25 ug via INTRAVENOUS

## 2023-03-05 MED ORDER — LABETALOL HCL 5 MG/ML IV SOLN: 10.0000 mg | INTRAVENOUS | Status: DC | PRN

## 2023-03-05 MED ORDER — HYDRALAZINE HCL 20 MG/ML IJ SOLN: 10.0000 mg | INTRAMUSCULAR | Status: DC | PRN

## 2023-03-05 MED ORDER — FENTANYL CITRATE (PF) 100 MCG/2ML IJ SOLN
INTRAMUSCULAR | Status: AC
  Filled 2023-03-05: qty 2

## 2023-03-05 MED ORDER — HEPARIN SODIUM (PORCINE) 1000 UNIT/ML IJ SOLN
INTRAMUSCULAR | Status: AC
  Filled 2023-03-05: qty 10

## 2023-03-05 MED ORDER — MIDAZOLAM HCL 2 MG/2ML IJ SOLN
INTRAMUSCULAR | Status: DC | PRN
  Administered 2023-03-05 (×2): 1 mg via INTRAVENOUS

## 2023-03-05 MED ORDER — HEPARIN SODIUM (PORCINE) 1000 UNIT/ML IJ SOLN
INTRAMUSCULAR | Status: DC | PRN
  Administered 2023-03-05: 5000 [IU] via INTRAVENOUS

## 2023-03-05 MED ORDER — SODIUM CHLORIDE 0.9 % IV SOLN: 250.0000 mL | INTRAVENOUS | Status: DC | PRN

## 2023-03-05 MED ORDER — VERAPAMIL HCL 2.5 MG/ML IV SOLN
INTRAVENOUS | Status: AC
  Filled 2023-03-05: qty 2

## 2023-03-05 MED ORDER — LIDOCAINE HCL (PF) 1 % IJ SOLN
INTRAMUSCULAR | Status: DC | PRN
  Administered 2023-03-05 (×2): 2 mL via INTRADERMAL

## 2023-03-05 MED ORDER — HEPARIN (PORCINE) IN NACL 1000-0.9 UT/500ML-% IV SOLN
INTRAVENOUS | Status: DC | PRN
  Administered 2023-03-05 (×2): 500 mL

## 2023-03-05 MED ORDER — SODIUM CHLORIDE 0.9 % WEIGHT BASED INFUSION
3.0000 mL/kg/h | INTRAVENOUS | Status: AC
  Administered 2023-03-05: 50 mL/h via INTRAVENOUS

## 2023-03-05 MED ORDER — SODIUM CHLORIDE 0.9% FLUSH: 3.0000 mL | Freq: Two times a day (BID) | INTRAVENOUS | Status: DC

## 2023-03-05 MED ORDER — SODIUM CHLORIDE 0.9% FLUSH: 3.0000 mL | INTRAVENOUS | Status: DC | PRN

## 2023-03-05 MED ORDER — SODIUM CHLORIDE 0.9 % WEIGHT BASED INFUSION: 1.0000 mL/kg/h | INTRAVENOUS | Status: DC

## 2023-03-05 MED ORDER — IOHEXOL 350 MG/ML SOLN
INTRAVENOUS | Status: DC | PRN
  Administered 2023-03-05: 55 mL

## 2023-03-05 MED ORDER — ONDANSETRON HCL 4 MG/2ML IJ SOLN: 4.0000 mg | Freq: Four times a day (QID) | INTRAMUSCULAR | Status: DC | PRN

## 2023-03-05 MED ORDER — VERAPAMIL HCL 2.5 MG/ML IV SOLN
INTRAVENOUS | Status: DC | PRN
  Administered 2023-03-05: 10 mL via INTRA_ARTERIAL

## 2023-03-05 MED ORDER — PROPOFOL 10 MG/ML IV BOLUS
INTRAVENOUS | Status: DC | PRN
  Administered 2023-03-05 (×2): 50 mg via INTRAVENOUS

## 2023-03-05 MED ORDER — PROPOFOL 500 MG/50ML IV EMUL
INTRAVENOUS | Status: DC | PRN
  Administered 2023-03-05: 150 ug/kg/min via INTRAVENOUS

## 2023-03-05 MED ORDER — MIDAZOLAM HCL 2 MG/2ML IJ SOLN
INTRAMUSCULAR | Status: AC
  Filled 2023-03-05: qty 2

## 2023-03-05 MED ORDER — ACETAMINOPHEN 325 MG PO TABS: 650.0000 mg | ORAL_TABLET | ORAL | Status: DC | PRN

## 2023-03-05 SURGICAL SUPPLY — 12 items
CATH 5FR JL3.5 JR4 ANG PIG MP (CATHETERS) IMPLANT
CATH BALLN WEDGE 5F 110CM (CATHETERS) IMPLANT
DEVICE RAD COMP TR BAND LRG (VASCULAR PRODUCTS) IMPLANT
GLIDESHEATH SLEND SS 6F .021 (SHEATH) IMPLANT
GUIDEWIRE INQWIRE 1.5J.035X260 (WIRE) IMPLANT
INQWIRE 1.5J .035X260CM (WIRE) ×1
KIT HEART LEFT (KITS) ×1 IMPLANT
PACK CARDIAC CATHETERIZATION (CUSTOM PROCEDURE TRAY) ×1 IMPLANT
SHEATH GLIDE SLENDER 4/5FR (SHEATH) IMPLANT
SYR MEDRAD MARK 7 150ML (SYRINGE) ×1 IMPLANT
TRANSDUCER W/STOPCOCK (MISCELLANEOUS) ×1 IMPLANT
TUBING CIL FLEX 10 FLL-RA (TUBING) ×1 IMPLANT

## 2023-03-05 NOTE — Transfer of Care (Signed)
Immediate Anesthesia Transfer of Care Note  Patient: [name]  Procedure(s) Performed: TRANSESOPHAGEAL ECHOCARDIOGRAM (TEE) BUBBLE STUDY  Patient Location: Endoscopy Unit  Anesthesia Type:MAC  Level of Consciousness: awake, alert , and oriented  Airway & Oxygen Therapy: Patient Spontanous Breathing  Post-op Assessment: Report given to RN, Post -op Vital signs reviewed and stable, Patient moving all extremities X 4, and Patient able to stick tongue midline  Post vital signs: Reviewed  Last Vitals:  Vitals Value Taken Time  BP 130/81   Temp 97.0   Pulse 67   Resp 24   SpO2 95     Last Pain:  Vitals:   03/05/23 0700  TempSrc: Temporal  PainSc: 0-No pain         Complications: No notable events documented.

## 2023-03-05 NOTE — Interval H&P Note (Signed)
History and Physical Interval Note:  03/05/2023 7:27 AM  [name]  has presented today for surgery, with the diagnosis of MR.  The various methods of treatment have been discussed with the patient and family. After consideration of risks, benefits and other options for treatment, the patient has consented to  Procedure(s): TRANSESOPHAGEAL ECHOCARDIOGRAM (TEE) (N/A) as a surgical intervention.  The patient's history has been reviewed, patient examined, no change in status, stable for surgery.  I have reviewed the patient's chart and labs.  Questions were answered to the patient's satisfaction.    NPO for TEE for mitral regurgitation.   Lake Bells T. Audie Box, MD, [place]  [address], [place], [place] [address] [phone]  7:27 AM

## 2023-03-05 NOTE — Progress Notes (Signed)
  Echocardiogram Echocardiogram Transesophageal has been performed.  [name] 03/05/2023, 8:30 AM

## 2023-03-05 NOTE — Interval H&P Note (Signed)
Cath Lab Visit (complete for each Cath Lab visit)  Clinical Evaluation Leading to the Procedure:   ACS: No.  Non-ACS:    Anginal Classification: CCS III  Anti-ischemic medical therapy: Minimal Therapy (1 class of medications)  Non-Invasive Test Results: High-risk stress test findings: cardiac mortality >3%/year  Prior CABG: No previous CABG   Severe mitral regurgitation.  Plan for diagnostic L/R HC.   History and Physical Interval Note:  03/05/2023 8:56 AM  [name]  has presented today for surgery, with the diagnosis of mitral regurg.  The various methods of treatment have been discussed with the patient and family. After consideration of risks, benefits and other options for treatment, the patient has consented to  Procedure(s): RIGHT/LEFT HEART CATH AND CORONARY ANGIOGRAPHY (N/A) as a surgical intervention.  The patient's history has been reviewed, patient examined, no change in status, stable for surgery.  I have reviewed the patient's chart and labs.  Questions were answered to the patient's satisfaction.     Larae Grooms

## 2023-03-05 NOTE — Progress Notes (Signed)
Dorinda in cath lab states Dr Irish Lack told her that client ok to go to short stay without IV and no cardiac monitoring

## 2023-03-05 NOTE — Anesthesia Postprocedure Evaluation (Signed)
Anesthesia Post Note  Patient: TASIA FRANTOM  Procedure(s) Performed: TRANSESOPHAGEAL ECHOCARDIOGRAM (TEE) BUBBLE STUDY     Patient location during evaluation: PACU Anesthesia Type: MAC Level of consciousness: awake and alert Pain management: pain level controlled Vital Signs Assessment: post-procedure vital signs reviewed and stable Respiratory status: spontaneous breathing, nonlabored ventilation and respiratory function stable Cardiovascular status: blood pressure returned to baseline Postop Assessment: no apparent nausea or vomiting Anesthetic complications: no   No notable events documented.  Last Vitals:  Vitals:   03/05/23 0845 03/05/23 0913  BP:    Pulse:    Resp:    Temp:    SpO2: 99% 92%    Last Pain:  Vitals:   03/05/23 0905  TempSrc:   PainSc: 0-No pain                 Marthenia Rolling

## 2023-03-05 NOTE — CV Procedure (Signed)
    TRANSESOPHAGEAL ECHOCARDIOGRAM   NAME:  [name]    MRN: [id number] DOB:  09/18/1966    ADMIT DATE: 03/05/2023  INDICATIONS: Mitral regurgitation   PROCEDURE:   Informed consent was obtained prior to the procedure. The risks, benefits and alternatives for the procedure were discussed and the patient comprehended these risks.  Risks include, but are not limited to, cough, sore throat, vomiting, nausea, somnolence, esophageal and stomach trauma or perforation, bleeding, low blood pressure, aspiration, pneumonia, infection, trauma to the teeth and death.    Procedural time out performed. The oropharynx was anesthetized with topical 1% benzocaine.    Anesthesia was administered by Dr. Daiva Huge.  The patient was administered 470 mg of propofol and 0 mg of lidocaine to achieve and maintain moderate conscious sedation.  The patient's heart rate, blood pressure, and oxygen saturation are monitored continuously during the procedure. The period of conscious sedation is 15 minutes, of which I was present face-to-face 100% of this time.   The transesophageal probe was inserted in the esophagus and stomach without difficulty and multiple views were obtained.   COMPLICATIONS:    There were no immediate complications.  KEY FINDINGS:  Moderate functional MR.  Normal LVEF, 59% Small PFO.  Full report to follow. Further management per primary team.   Lake Bells T. Audie Box, MD, [place]  [address] [place], [place] [address] [phone]  8:32 AM

## 2023-03-05 NOTE — Anesthesia Procedure Notes (Signed)
Procedure Name: General with mask airway Date/Time: 03/05/2023 7:43 AM  Performed by: Maude Leriche, CRNAPre-anesthesia Checklist: Patient identified, Emergency Drugs available, Suction available, Patient being monitored and Timeout performed Patient Re-evaluated:Patient Re-evaluated prior to induction Oxygen Delivery Method: Nasal cannula Preoxygenation: Pre-oxygenation with 100% oxygen Induction Type: IV induction Placement Confirmation: positive ETCO2 Dental Injury: Teeth and Oropharynx as per pre-operative assessment

## 2023-03-06 ENCOUNTER — Telehealth: Payer: Self-pay | Admitting: Cardiovascular Disease

## 2023-03-06 ENCOUNTER — Encounter (HOSPITAL_COMMUNITY): Payer: Self-pay | Admitting: Interventional Cardiology

## 2023-03-06 ENCOUNTER — Encounter: Payer: Self-pay | Admitting: Cardiovascular Disease

## 2023-03-06 NOTE — Telephone Encounter (Signed)
Pt c/o of Chest Pain: STAT if CP now or developed within 24 hours  1. Are you having CP right now? yes  2. Are you experiencing any other symptoms (ex. SOB, nausea, vomiting, sweating)? Having arm pain.   3. How long have you been experiencing CP? Started this morning around 5am  4. Is your CP continuous or coming and going? It comes and goes.   5. Have you taken Nitroglycerin? No  Patient states she had procedure done yesterday and was given no instructions, nor was she given anything for pain.  ?

## 2023-03-06 NOTE — Telephone Encounter (Signed)
Patient states since cath yesterday her R arm that been hurting. States it her entire arm, not the insertion site. Denies that arm is swollen,discolored, or red. Is able to bend arm, move fingertips, denies parasthesias. Condones pain to esophageal region since the TEE. States "I aint having heart attack pain, it's just sore from where they stuck that camera down my throat." States she used a warm compress on her arm and it improved the pain. Advised her to increase her fluid intake (notes in cath procedure state they had to remove IV early for TR banding and opted not to place another to give fluids post-procedure) and take OTC pain medication for help, that the pain she's describing doesn't sound abnormal. She agrees she doesn't feel its out of the ordinary, but her primary concern for today's call is just to let us know and request a note for her work. 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 Letter done and placed up front for her to pick up per her request.

## 2023-03-07 ENCOUNTER — Encounter (HOSPITAL_COMMUNITY): Payer: Self-pay | Admitting: Cardiovascular Disease

## 2023-03-09 ENCOUNTER — Telehealth: Payer: Self-pay | Admitting: Cardiovascular Disease

## 2023-03-09 NOTE — Telephone Encounter (Signed)
Completed [employer] form scanned into documents. Patient and billing notified.

## 2023-03-31 ENCOUNTER — Other Ambulatory Visit: Payer: Self-pay | Admitting: *Deleted

## 2023-03-31 ENCOUNTER — Other Ambulatory Visit: Payer: Self-pay | Admitting: Cardiovascular Disease

## 2023-03-31 MED ORDER — METOPROLOL SUCCINATE ER 25 MG PO TB24: ORAL_TABLET | ORAL | 3 refills | Status: DC

## 2023-04-13 ENCOUNTER — Ambulatory Visit
Admission: RE | Admit: 2023-04-13 | Discharge: 2023-04-13 | Disposition: A | Discharge status: Home / Self Care | Payer: Medicare Other | Source: Ambulatory Visit | Attending: Internal Medicine | Admitting: Internal Medicine

## 2023-04-13 ENCOUNTER — Ambulatory Visit
Admission: RE | Admit: 2023-04-13 | Discharge: 2023-04-13 | Disposition: A | Discharge status: Home / Self Care | Payer: 59 | Source: Ambulatory Visit | Attending: Internal Medicine | Admitting: Internal Medicine

## 2023-04-13 ENCOUNTER — Other Ambulatory Visit: Payer: Self-pay | Admitting: Internal Medicine

## 2023-04-13 DIAGNOSIS — N6321 Unspecified lump in the left breast, upper outer quadrant: Secondary | ICD-10-CM | POA: Diagnosis not present

## 2023-04-13 DIAGNOSIS — N6320 Unspecified lump in the left breast, unspecified quadrant: Secondary | ICD-10-CM

## 2023-04-13 DIAGNOSIS — N6323 Unspecified lump in the left breast, lower outer quadrant: Secondary | ICD-10-CM | POA: Diagnosis not present

## 2023-04-13 DIAGNOSIS — N644 Mastodynia: Secondary | ICD-10-CM

## 2023-04-13 DIAGNOSIS — R928 Other abnormal and inconclusive findings on diagnostic imaging of breast: Secondary | ICD-10-CM | POA: Diagnosis not present

## 2023-04-23 ENCOUNTER — Telehealth: Payer: Self-pay | Admitting: Cardiovascular Disease

## 2023-04-23 NOTE — Telephone Encounter (Signed)
Pt states she has having left arm pain and bad night sweats. She denies any other symptoms. Please advise what pt should do.  ?

## 2023-04-23 NOTE — Telephone Encounter (Signed)
Attempted to call patient x2. Call will not go through will try again later.

## 2023-04-27 ENCOUNTER — Other Ambulatory Visit: Payer: 59

## 2023-04-27 NOTE — Telephone Encounter (Signed)
An appointment was made with [facility] for tomorrow.

## 2023-04-27 NOTE — Telephone Encounter (Signed)
Spoke to the patient,she called last week the office called her 2 x but was not able to reach the patient. Pt mentioned when she called last week, she was experiencing left arm pain and night sweats for a week. Pt thought her arm pain was because she sleeps on her left side. However, she has started sleeping on her right side and her left arm pain did not subside. Pt mentioned both hands are swollen, however, this is an ongoing issue. She is taking bayer for pain and a medication from the neurologist ( was not on the medication and pt did not remember the name of the mediation).  Pt  mentioned she has on going chest pain since her cardiac cath on 3/14. She has not seek medical attention pertaining to her symptoms. Will forward to MD and nurse for advise.

## 2023-04-28 ENCOUNTER — Ambulatory Visit: Payer: 59 | Attending: Nurse Practitioner | Admitting: Nurse Practitioner

## 2023-04-28 ENCOUNTER — Encounter: Payer: Self-pay | Admitting: Nurse Practitioner

## 2023-04-28 VITALS — BP 170/98 | HR 105 | Ht 66.0 in | Wt 226.8 lb

## 2023-04-28 DIAGNOSIS — R079 Chest pain, unspecified: Secondary | ICD-10-CM | POA: Diagnosis not present

## 2023-04-28 DIAGNOSIS — R232 Flushing: Secondary | ICD-10-CM

## 2023-04-28 DIAGNOSIS — I10 Essential (primary) hypertension: Secondary | ICD-10-CM

## 2023-04-28 DIAGNOSIS — M79602 Pain in left arm: Secondary | ICD-10-CM | POA: Diagnosis not present

## 2023-04-28 DIAGNOSIS — I471 Supraventricular tachycardia, unspecified: Secondary | ICD-10-CM

## 2023-04-28 DIAGNOSIS — R002 Palpitations: Secondary | ICD-10-CM

## 2023-04-28 DIAGNOSIS — I340 Nonrheumatic mitral (valve) insufficiency: Secondary | ICD-10-CM | POA: Diagnosis not present

## 2023-04-28 DIAGNOSIS — I428 Other cardiomyopathies: Secondary | ICD-10-CM | POA: Diagnosis not present

## 2023-04-28 NOTE — Progress Notes (Signed)
Cardiology Office Note:    Date:  04/28/2023   ID:  [name], [name] 1966-06-17, MRN [id number]  PCP:  Crissie Sickles, MD   [facility] Providers Cardiologist:  Verne Carrow, MD     Referring MD: Crissie Sickles, MD   Chief Complaint: follow-up MR  History of Present Illness:    [name] is a very pleasant 57 y.o. female with a hx of mitral regurgitation, anemia, HTN, obesity, tobacco abuse, and aortic atherosclerosis.   Seen as a new patient by Dr. Clifton James in December 2022 for palpitations.  She had been seen in [facility] urgent care 11/13/2021 with palpitations and chest tightness that occurred after climbing stairs.  She was restarted on atenolol but had not yet started it when seen by Dr. Clifton James.  She had recurrent palpitations that lasted for several seconds.  Also reported dyspnea but no chest pain.  Echo January 2023 with low normal LV systolic function LVEF 50%, no RWMA, moderate MR. Cardiac monitor with sinus rhythm, 2 beat runs of VT and 9 runs of SVT longest lasting 16 seconds.  Echo 01/29/2023 with LVEF around 45 to 50% and moderate to severe MR.  Seen by Dr. Clifton James 02/04/2023 and reported dyspnea at rest and with exertion.  She was also having sharp chest pain.  She was scheduled for TEE and heart catheterization for evaluation of valve disease and chest pain.  TEE 03/05/23 revealed moderate MR with normal LVEF 55 to 60%, mild grade  II plaque involving the descending aorta. Right and left heart catheterization 03/05/2023 revealed normal systolic function, mildly elevated LVEDP, no aortic stenosis, hemodynamic findings consistent with mild pulmonary hypertension, no significant coronary artery disease  Today, she is here for evaluation of left arm pain. Also having night sweats and sharp chest pain and feels heart skip a beat or two. Symptoms occur randomly and do not worsen with exertion. Stopped sleeping on left arm and pain in left arm has improved. Night  sweats occur just about every night, did not have these previously around the time she was going through menopause. Admits she did not not take BP medication today. Has occasional palpitations that feel likes skips or jumps. No shortness of breath, lower extremity edema, fatigue,  weakness, presyncope, syncope, orthopnea, and PND. Admits she needs to lose weight and eat healthier. We reviewed her cardiac test results in detail and questions were answered to her satisfaction.   Past Medical History:  Diagnosis Date   Anemia    COVID-19 virus infection 11/2020   Fibroids    Heart palpitations    History of    History of blood transfusion    with knee replacement   History of colon polyps    Hypertension    Hypertension    no current meds for htn   Knee joint injury    Morbid obesity (HCC)    Pneumonia    remote history   Thyroid goiter    bilateral goiter and cyst follow up every 2 years Dr. Lazarus Salines   Tobacco abuse     Past Surgical History:  Procedure Laterality Date   ABDOMINAL AORTOGRAM N/A 03/05/2023   Procedure: ABDOMINAL AORTOGRAM;  Surgeon: Corky Crafts, MD;  Location: [facility] INVASIVE CV LAB;  Service: Cardiovascular;  Laterality: N/A;   ANKLE SURGERY Right 2009   broken ankle    arthroscopic surgery to knee Bilateral    BREAST BIOPSY Left 03/24/2019   BUBBLE STUDY  03/05/2023   Procedure: BUBBLE  STUDY;  Surgeon: Sande Rives, MD;  Location: [facility] ENDOSCOPY;  Service: Cardiovascular;;   CESAREAN SECTION     COLONOSCOPY     HAND SURGERY Bilateral    JOINT REPLACEMENT Bilateral    knees   REPLACEMENT TOTAL KNEE Right 2012   RIGHT/LEFT HEART CATH AND CORONARY ANGIOGRAPHY N/A 03/05/2023   Procedure: RIGHT/LEFT HEART CATH AND CORONARY ANGIOGRAPHY;  Surgeon: Corky Crafts, MD;  Location[facility] INVAS[facility] CV LAB;  Service: Cardiovascular;  Laterality: N/A;   SHOULDER OPEN ROTATOR CUFF REPAIR Right 10/12/2019   Procedure: Right shoulder rotator cuff repair with graft  and anchors;  Surgeon: Ranee Gosselin, MD;  Location: [facility] ORS;  Service: Orthopedics;  Laterality: Right;    TEE WITHOUT CARDIOVERSION N/A 03/05/2023   Procedure: TRANSESOPHAGEAL ECHOCARDIOGRAM (TEE);  Surgeon: Sande Rives, MD;  Location: [facility] ENDOSCOPY;  Service: Cardiovascular;  Laterality: N/A;   TOTAL KNEE ARTHROPLASTY Left 01/03/2014   Procedure: LEFT TOTAL KNEE ARTHROPLASTY;  Surgeon: Jacki Cones, MD;  Location: [facility] ORS;  Service: Orthopedics;  Laterality: Left;   TUBAL LIGATION      Current Medications: Current Meds  Medication Sig   furosemide (LASIX) 40 MG tablet Take 1 tablet (40 mg total) by mouth daily. (Patient taking differently: Take 40 mg by mouth daily as needed for fluid or edema.)   lisinopril (ZESTRIL) 5 MG tablet Take 1 tablet (5 mg total) by mouth daily. Please keep scheduled appointment for future refills. Thank you.   metoprolol succinate (TOPROL-XL) 25 MG 24 hr tablet TAKE 1 TABLET(25 MG) BY MOUTH DAILY. MAY TAKE 1 EXTRA TABLET AS NEEDED FOR HEART RACING     Allergies:   Tramadol, Ibuprofen, Metronidazole, and Penicillins   Social History   Socioeconomic History   Marital status: Single    Spouse name: Not on file   Number of children: 2   Years of education: Not on file   Highest education level: Not on file  Occupational History   Occupation: disabled    Employer: DISABLED   Tobacco Use   Smoking status: Every Day    Packs/day: 0.20    Years: 20.00    Additional pack years: 0.00    Total pack years: 4.00    Types: Cigarettes   Smokeless tobacco: Never   Tobacco comments:    1-2  cigs per day/ a pack in 2 weeks 10/18  Vaping Use   Vaping Use: Never used  Substance and Sexual Activity   Alcohol use: Yes    Alcohol/week: 2.0 standard drinks of alcohol    Types: 2 Standard drinks or equivalent per week    Comment: occasionally once per month   Drug use: No   Sexual activity: Yes    Birth control/protection: Surgical  Other Topics  Concern   Not on file  Social History Narrative   ** Merged History Encounter ** Applying for disability, previously worked in [employer]   Right handed   Caffeine rarely   Live in two story    Social Determinants of Health   Financial Resource Strain: Not on file  Food Insecurity: Not on file  Transportation Needs: Not on file  Physical Activity: Not on file  Stress: Not on file  Social Connections: Not on file     Family History: The patient's family history includes Atrial fibrillation in her father; Cancer in her maternal grandmother; Dementia in her mother; Hypertension in her father and mother.  ROS:   Please see the history of present  illness.    + left arm pain + night sweats All other systems reviewed and are negative.  Labs/Other Studies Reviewed:    The following studies were reviewed today:  [facility] 03/05/23   The left ventricular systolic function is normal.   LV end diastolic pressure is mildly elevated.   The left ventricular ejection fraction is 55-65% by visual estimate.   Hemodynamic findings consistent with mild pulmonary hypertension.   There is no aortic valve stenosis.   Aortic saturation 95%, PA saturation 65%, right atrial pressure 11/10, PA pressure 38/25, mean PA pressure 33 mmHg, pulmonary capillary wedge pressure 20/17, cardiac output 6.4 L/min, cardiac index 2.91.   No significant coronary artery disease.  Continue with plans for mitral valve regurgitation management.  Echo TEE 03/05/23 1. Moderate mitral regurgitation due to restricted PMVL. 2D ERO 0.26 cm2,  3D VCA 0.25 cm2, R vol 49 cc. RF 39%. Systolic dominant flow in all  pulmonary veins. All parameters consitent with moderate regurgitation. The  mitral valve is grossly normal.  Moderate mitral valve regurgitation. No evidence of mitral stenosis.   2. Left ventricular ejection fraction, by estimation, is 55 to 60%. The  left ventricle has normal function. The left ventricle has no  regional  wall motion abnormalities.   3. Right ventricular systolic function is normal. The right ventricular  size is normal.   4. Left atrial size was mild to moderately dilated. No left atrial/left  atrial appendage thrombus was detected.   5. The aortic valve is tricuspid. Aortic valve regurgitation is not  visualized. No aortic stenosis is present.   6. There is mild (Grade II) layered plaque involving the descending  aorta.   7. Agitated saline contrast bubble study was positive with shunting  observed within 3-6 cardiac cycles suggestive of interatrial shunt. There  is a small patent foramen ovale with predominantly right to left shunting  across the atrial septum.  Recent Labs: 07/24/2022: ALT 14 02/04/2023: BUN 21; Creatinine, Ser 1.00; Platelets 178 03/05/2023: Hemoglobin 11.6; Hemoglobin 11.6; Potassium 4.0; Potassium 4.0; Sodium 140; Sodium 140  Recent Lipid Panel    Component Value Date/Time   CHOL 113 10/18/2009 2100   TRIG 36 10/18/2009 2100   HDL 54 10/18/2009 2100   CHOLHDL 2.1 Ratio 10/18/2009 2100   VLDL 7 10/18/2009 2100   LDLCALC 52 10/18/2009 2100     Risk Assessment/Calculations:           Physical Exam:    VS:  BP (!) 170/98   Pulse (!) 105   Ht 5\' 6"  (1.676 m)   Wt 226 lb 12.8 oz (102.9 kg)   LMP 06/07/2016   SpO2 98%   BMI 36.61 kg/m     Wt Readings from Last 3 Encounters:  04/28/23 226 lb 12.8 oz (102.9 kg)  03/05/23 253 lb (114.8 kg)  02/06/23 257 lb (116.6 kg)     GEN: Obese, well developed in no acute distress HEENT: Normal NECK: No JVD; No carotid bruits CARDIAC: RRR, systolic murmur. No rubs, gallops RESPIRATORY:  Clear to auscultation without rales, wheezing or rhonchi  ABDOMEN: Soft, non-tender, non-distended MUSCULOSKELETAL:  No edema; No deformity. 2+ pedal pulses, equal bilaterally SKIN: Warm and dry NEUROLOGIC:  Alert and oriented x 3 PSYCHIATRIC:  Normal affect   EKG:  EKG is ordered today.  EKG reveals normal sinus  rhythm at 91 bpm, no ST abnormality  HYPERTENSION CONTROL Vitals:   04/28/23 1416 04/28/23 1706  BP: (!) 179/112 (!) 170/98  The patient's blood pressure is elevated above target today.  In order to address the patient's elevated BP: Blood pressure will be monitored at home to determine if medication changes need to be made. (did not take medication prior to visit)       Diagnoses:    1. SVT (supraventricular tachycardia)   2. Palpitations   3. Nonrheumatic mitral valve regurgitation   4. NICM (nonischemic cardiomyopathy) (HCC)   5. Chest pain of uncertain etiology   6. Hot flashes   7. Essential hypertension   8. Left arm pain    Assessment and Plan:      Palpitations/SVT: Cardiac monitor 11/2021 revealed sinus rhythm with quick bursts of SVT and rare PVCs.  She was placed on metoprolol at the time. Admits she has not taken it today. She has a variety of symptoms including rare palpitations, night sweats, chest and left arm pain. Once I reviewed cardiac tests with her in detail, she seemed to feel a lot better. She expressed gratitude for the explanation.  Advised her to continue metoprolol daily and we can increase dose in the future if needed.   Chest pain: She reported occasional sharp chest pains, not suggestive of angina. Reviewed cardiac catheterization from 03/05/2023 in detail.  Advised her of symptoms of abrupt plaque rupture or dissection to monitor for and reassurance from recent test results. Encouraged primary prevention including heart healthy diet, 150 minutes of moderate intensity exercise each week, and good BP control. We should check lipids at next office visit.   Mitral regurgitation: Moderate MR on TEE 03/05/2023. Symptoms do not suggest worsening valve disease. We will plan to recheck TTE in 6 months. She wants to keep her July office appointment with Dr. Clifton James.   Chronic HFrEF/NICM: Echo 01/29/2023 revealed LVEF 45 to 50%, global hypokinesis, mild LVH,  normal diastolic parameters.  She appears euvolemic on exam, however body habitus makes it difficult to assess.  Encouraged better BP control.  I have asked her to monitor on a consistent basis.  Continue furosemide, lisinopril, metoprolol.  Hypertension: BP is significantly elevated today. It did improve on my recheck. Admits she did not take anti-hypertensives today. Monitor BP and call to report consistent readings > 140/80. Continue furosemide, lisinopril, metoprolol.  Left arm pain: Improved after changing her sleep position to avoid sleeping on her left arm. No evidence of deformity.  Encouraged her to follow-up with PCP for continued pain.    Disposition: Keep your July appointment with Dr. Clifton James  Medication Adjustments/Labs and Tests Ordered: Current medicines are reviewed at length with the patient today.  Concerns regarding medicines are outlined above.  Orders Placed This Encounter  Procedures   EKG 12-Lead   No orders of the defined types were placed in this encounter.   Patient Instructions  Medication Instructions:  Your physician recommends that you continue on your current medications as directed. Please refer to the Current Medication list given to you today.  *If you need a refill on your cardiac medications before your next appointment, please call your pharmacy*   Lab Work: NONE  If you have labs (blood work) drawn today and your tests are completely normal, you will receive your results only by: MyChart Message (if you have MyChart) OR A paper copy in the mail If you have any lab test that is abnormal or we need to change your treatment, we will call you to review the results.   Testing/Procedures: EKG   Follow-Up: At [facility], you  and your health needs are our priority.  As part of our continuing mission to provide you with exceptional heart care, we have created designated Provider Care Teams.  These Care Teams include your primary  Cardiologist (physician) and Advanced Practice Providers (APPs -  Physician Assistants and Nurse Practitioners) who all work together to provide you with the care you need, when you need it.  We recommend signing up for the patient portal called "MyChart".  Sign up information is provided on this After Visit Summary.  MyChart is used to connect with patients for Virtual Visits (Telemedicine).  Patients are able to view lab/test results, encounter notes, upcoming appointments, etc.  Non-urgent messages can be sent to your provider as well.   To learn more about what you can do with MyChart, go to [email or web address].    Your next appointment:   KEEP SCHEDULED FOLLOW-UP  Other Instructions Please check your blood pressure 1-2 times and your systolic number (top #) is 140 or greater continuously, please call our office to let us know.  DASH Eating Plan DASH stands for Dietary Approaches to Stop Hypertension. The DASH eating plan is a healthy eating plan that has been shown to: Reduce high blood pressure (hypertension). Reduce your risk for type 2 diabetes, heart disease, and stroke. Help with weight loss. What are tips for following this plan? Reading food labels Check food labels for the amount of salt (sodium) per serving. Choose foods with less than 5 percent of the Daily Value of sodium. Generally, foods with less than 300 milligrams (mg) of sodium per serving fit into this eating plan. To find whole grains, look for the word "whole" as the first word in the ingredient list. Shopping Buy products labeled as "low-sodium" or "no salt added." Buy fresh foods. Avoid canned foods and pre-made or frozen meals. Cooking Avoid adding salt when cooking. Use salt-free seasonings or herbs instead of table salt or sea salt. Check with your health care provider or pharmacist before using salt substitutes. Do not fry foods. Cook foods using healthy methods such as baking, boiling, grilling, roasting,  and broiling instead. Cook with heart-healthy oils, such as olive, canola, avocado, soybean, or sunflower oil. Meal planning  Eat a balanced diet that includes: 4 or more servings of fruits and 4 or more servings of vegetables each day. Try to fill one-half of your plate with fruits and vegetables. 6-8 servings of whole grains each day. Less than 6 oz (170 g) of lean meat, poultry, or fish each day. A 3-oz (85-g) serving of meat is about the same size as a deck of cards. One egg equals 1 oz (28 g). 2-3 servings of low-fat dairy each day. One serving is 1 cup (237 mL). 1 serving of nuts, seeds, or beans 5 times each week. 2-3 servings of heart-healthy fats. Healthy fats called omega-3 fatty acids are found in foods such as walnuts, flaxseeds, fortified milks, and eggs. These fats are also found in cold-water fish, such as sardines, salmon, and mackerel. Limit how much you eat of: Canned or prepackaged foods. Food that is high in trans fat, such as some fried foods. Food that is high in saturated fat, such as fatty meat. Desserts and other sweets, sugary drinks, and other foods with added sugar. Full-fat dairy products. Do not salt foods before eating. Do not eat more than 4 egg yolks a week. Try to eat at least 2 vegetarian meals a week. Eat more home-cooked food and less restaurant, buffet, and  fast food. Lifestyle When eating at a restaurant, ask that your food be prepared with less salt or no salt, if possible. If you drink alcohol: Limit how much you use to: 0-1 drink a day for women who are not pregnant. 0-2 drinks a day for men. Be aware of how much alcohol is in your drink. In the [place] one drink equals one 12 oz bottle of beer (355 mL), one 5 oz glass of wine (148 mL), or one 1 oz glass of hard liquor (44 mL). General information Avoid eating more than 2,300 mg of salt a day. If you have hypertension, you may need to reduce your sodium intake to 1,500 mg a day. Work with your  health care provider to maintain a healthy body weight or to lose weight. Ask what an ideal weight is for you. Get at least 30 minutes of exercise that causes your heart to beat faster (aerobic exercise) most days of the week. Activities may include walking, swimming, or biking. Work with your health care provider or dietitian to adjust your eating plan to your individual calorie needs. What foods should I eat? Fruits All fresh, dried, or frozen fruit. Canned fruit in natural juice (without added sugar). Vegetables Fresh or frozen vegetables (raw, steamed, roasted, or grilled). Low-sodium or reduced-sodium tomato and vegetable juice. Low-sodium or reduced-sodium tomato sauce and tomato paste. Low-sodium or reduced-sodium canned vegetables. Grains Whole-grain or whole-wheat bread. Whole-grain or whole-wheat pasta. Brown rice. Orpah Cobb. Bulgur. Whole-grain and low-sodium cereals. Pita bread. Low-fat, low-sodium crackers. Whole-wheat flour tortillas. Meats and other proteins Skinless chicken or [place]. Ground chicken or [place]. Pork with fat trimmed off. Fish and seafood. Egg whites. Dried beans, peas, or lentils. Unsalted nuts, nut butters, and seeds. Unsalted canned beans. Lean cuts of beef with fat trimmed off. Low-sodium, lean precooked or cured meat, such as sausages or meat loaves. Dairy Low-fat (1%) or fat-free (skim) milk. Reduced-fat, low-fat, or fat-free cheeses. Nonfat, low-sodium ricotta or cottage cheese. Low-fat or nonfat yogurt. Low-fat, low-sodium cheese. Fats and oils Soft margarine without trans fats. Vegetable oil. Reduced-fat, low-fat, or light mayonnaise and salad dressings (reduced-sodium). Canola, safflower, olive, avocado, soybean, and sunflower oils. Avocado. Seasonings and condiments Herbs. Spices. Seasoning mixes without salt. Other foods Unsalted popcorn and pretzels. Fat-free sweets. The items listed above may not be a complete list of foods and beverages you can  eat. Contact a dietitian for more information. What foods should I avoid? Fruits Canned fruit in a light or heavy syrup. Fried fruit. Fruit in cream or butter sauce. Vegetables Creamed or fried vegetables. Vegetables in a cheese sauce. Regular canned vegetables (not low-sodium or reduced-sodium). Regular canned tomato sauce and paste (not low-sodium or reduced-sodium). Regular tomato and vegetable juice (not low-sodium or reduced-sodium). Rosita Fire. Olives. Grains Baked goods made with fat, such as croissants, muffins, or some breads. Dry pasta or rice meal packs. Meats and other proteins Fatty cuts of meat. Ribs. Fried meat. Tomasa Blase. Bologna, salami, and other precooked or cured meats, such as sausages or meat loaves. Fat from the back of a pig (fatback). Bratwurst. Salted nuts and seeds. Canned beans with added salt. Canned or smoked fish. Whole eggs or egg yolks. Chicken or [place] with skin. Dairy Whole or 2% milk, cream, and half-and-half. Whole or full-fat cream cheese. Whole-fat or sweetened yogurt. Full-fat cheese. Nondairy creamers. Whipped toppings. Processed cheese and cheese spreads. Fats and oils Butter. Stick margarine. Lard. Shortening. Ghee. Bacon fat. Tropical oils, such as coconut, palm kernel, or  palm oil. Seasonings and condiments Onion salt, garlic salt, seasoned salt, table salt, and sea salt. Worcestershire sauce. Tartar sauce. Barbecue sauce. Teriyaki sauce. Soy sauce, including reduced-sodium. Steak sauce. Canned and packaged gravies. Fish sauce. Oyster sauce. Cocktail sauce. Store-bought horseradish. Ketchup. Mustard. Meat flavorings and tenderizers. Bouillon cubes. Hot sauces. Pre-made or packaged marinades. Pre-made or packaged taco seasonings. Relishes. Regular salad dressings. Other foods Salted popcorn and pretzels. The items listed above may not be a complete list of foods and beverages you should avoid. Contact a dietitian for more information. Where to find more  information National Heart, Lung, and Blood Institute: PopSteam.is American Heart Association: www.heart.org Academy of Nutrition and Dietetics: www.eatright.org National Kidney Foundation: www.kidney.org Summary The DASH eating plan is a healthy eating plan that has been shown to reduce high blood pressure (hypertension). It may also reduce your risk for type 2 diabetes, heart disease, and stroke. When on the DASH eating plan, aim to eat more fresh fruits and vegetables, whole grains, lean proteins, low-fat dairy, and heart-healthy fats. With the DASH eating plan, you should limit salt (sodium) intake to 2,300 mg a day. If you have hypertension, you may need to reduce your sodium intake to 1,500 mg a day. Work with your health care provider or dietitian to adjust your eating plan to your individual calorie needs. This information is not intended to replace advice given to you by your health care provider. Make sure you discuss any questions you have with your health care provider. Document Revised: 11/11/2019 Document Reviewed: 11/11/2019 Elsevier Patient Education  2023 Elsevier Inc.  Heart-Healthy Eating Plan Eating a healthy diet is important for the health of your heart. A heart-healthy eating plan includes: Eating less unhealthy fats. Eating more healthy fats. Eating less salt in your food. Salt is also called sodium. Making other changes in your diet. Talk with your doctor or a diet specialist (dietitian) to create an eating plan that is right for you. What is my plan? Your doctor may recommend an eating plan that includes: Total fat: ______% or less of total calories a day. Saturated fat: ______% or less of total calories a day. Cholesterol: less than _________mg a day. Sodium: less than _________mg a day. What are tips for following this plan? Cooking Avoid frying your food. Try to bake, boil, grill, or broil it instead. You can also reduce fat by: Removing the skin from  poultry. Removing all visible fats from meats. Steaming vegetables in water or broth. Meal planning  At meals, divide your plate into four equal parts: Fill one-half of your plate with vegetables and green salads. Fill one-fourth of your plate with whole grains. Fill one-fourth of your plate with lean protein foods. Eat 2-4 cups of vegetables per day. One cup of vegetables is: 1 cup (91 g) broccoli or cauliflower florets. 2 medium carrots. 1 large bell pepper. 1 large sweet potato. 1 large tomato. 1 medium white potato. 2 cups (150 g) raw leafy greens. Eat 1-2 cups of fruit per day. One cup of fruit is: 1 small apple 1 large banana 1 cup (237 g) mixed fruit, 1 large orange,  cup (82 g) dried fruit, 1 cup (240 mL) 100% fruit juice. Eat more foods that have soluble fiber. These are apples, broccoli, carrots, beans, peas, and barley. Try to get 20-30 g of fiber per day. Eat 4-5 servings of nuts, legumes, and seeds per week: 1 serving of dried beans or legumes equals  cup (90 g) cooked. 1 serving  of nuts is  oz (12 almonds, 24 pistachios, or 7 walnut halves). 1 serving of seeds equals  oz (8 g). General information Eat more home-cooked food. Eat less restaurant, buffet, and fast food. Limit or avoid alcohol. Limit foods that are high in starch and sugar. Avoid fried foods. Lose weight if you are overweight. Keep track of how much salt (sodium) you eat. This is important if you have high blood pressure. Ask your doctor to tell you more about this. Try to add vegetarian meals each week. Fats Choose healthy fats. These include olive oil and canola oil, flaxseeds, walnuts, almonds, and seeds. Eat more omega-3 fats. These include salmon, mackerel, sardines, tuna, flaxseed oil, and ground flaxseeds. Try to eat fish at least 2 times each week. Check food labels. Avoid foods with trans fats or high amounts of saturated fat. Limit saturated fats. These are often found in animal  products, such as meats, butter, and cream. These are also found in plant foods, such as palm oil, palm kernel oil, and coconut oil. Avoid foods with partially hydrogenated oils in them. These have trans fats. Examples are stick margarine, some tub margarines, cookies, crackers, and other baked goods. What foods should I eat? Fruits All fresh, canned (in natural juice), or frozen fruits. Vegetables Fresh or frozen vegetables (raw, steamed, roasted, or grilled). Green salads. Grains Most grains. Choose whole wheat and whole grains most of the time. Rice and pasta, including brown rice and pastas made with whole wheat. Meats and other proteins Lean, well-trimmed beef, veal, pork, and lamb. Chicken and [place] without skin. All fish and shellfish. Wild duck, rabbit, pheasant, and venison. Egg whites or low-cholesterol egg substitutes. Dried beans, peas, lentils, and tofu. Seeds and most nuts. Dairy Low-fat or nonfat cheeses, including ricotta and mozzarella. Skim or 1% milk that is liquid, powdered, or evaporated. Buttermilk that is made with low-fat milk. Nonfat or low-fat yogurt. Fats and oils Non-hydrogenated (trans-free) margarines. Vegetable oils, including soybean, sesame, sunflower, olive, peanut, safflower, corn, canola, and cottonseed. Salad dressings or mayonnaise made with a vegetable oil. Beverages Mineral water. Coffee and tea. Diet carbonated beverages. Sweets and desserts Sherbet, gelatin, and fruit ice. Small amounts of dark chocolate. Limit all sweets and desserts. Seasonings and condiments All seasonings and condiments. The items listed above may not be a complete list of foods and drinks you can eat. Contact a dietitian for more options. What foods should I avoid? Fruits Canned fruit in heavy syrup. Fruit in cream or butter sauce. Fried fruit. Limit coconut. Vegetables Vegetables cooked in cheese, cream, or butter sauce. Fried vegetables. Grains Breads that are made with  saturated or trans fats, oils, or whole milk. Croissants. Sweet rolls. Donuts. High-fat crackers, such as cheese crackers. Meats and other proteins Fatty meats, such as hot dogs, ribs, sausage, bacon, rib-eye roast or steak. High-fat deli meats, such as salami and bologna. Caviar. Domestic duck and goose. Organ meats, such as liver. Dairy Cream, sour cream, cream cheese, and creamed cottage cheese. Whole-milk cheeses. Whole or 2% milk that is liquid, evaporated, or condensed. Whole buttermilk. Cream sauce or high-fat cheese sauce. Yogurt that is made from whole milk. Fats and oils Meat fat, or shortening. Cocoa butter, hydrogenated oils, palm oil, coconut oil, palm kernel oil. Solid fats and shortenings, including bacon fat, salt pork, lard, and butter. Nondairy cream substitutes. Salad dressings with cheese or sour cream. Beverages Regular sodas and juice drinks with added sugar. Sweets and desserts Frosting. Pudding. Cookies. Cakes. Pies.  Milk chocolate or white chocolate. Buttered syrups. Full-fat ice cream or ice cream drinks. The items listed above may not be a complete list of foods and drinks to avoid. Contact a dietitian for more information. Summary Heart-healthy meal planning includes eating less unhealthy fats, eating more healthy fats, and making other changes in your diet. Eat a balanced diet. This includes fruits and vegetables, low-fat or nonfat dairy, lean protein, nuts and legumes, whole grains, and heart-healthy oils and fats. This information is not intended to replace advice given to you by your health care provider. Make sure you discuss any questions you have with your health care provider. Document Revised: 01/13/2022 Document Reviewed: 01/13/2022 Elsevier Patient Education  [address]     Signed, Levi Aland, NP  04/28/2023 5:26 PM    [facility]

## 2023-04-28 NOTE — Patient Instructions (Addendum)
Medication Instructions:  Your physician recommends that you continue on your current medications as directed. Please refer to the Current Medication list given to you today.  *If you need a refill on your cardiac medications before your next appointment, please call your pharmacy*   Lab Work: NONE  If you have labs (blood work) drawn today and your tests are completely normal, you will receive your results only by: MyChart Message (if you have MyChart) OR A paper copy in the mail If you have any lab test that is abnormal or we need to change your treatment, we will call you to review the results.   Testing/Procedures: EKG   Follow-Up: At [facility], you and your health needs are our priority.  As part of our continuing mission to provide you with exceptional heart care, we have created designated Provider Care Teams.  These Care Teams include your primary Cardiologist (physician) and Advanced Practice Providers (APPs -  Physician Assistants and Nurse Practitioners) who all work together to provide you with the care you need, when you need it.  We recommend signing up for the patient portal called "MyChart".  Sign up information is provided on this After Visit Summary.  MyChart is used to connect with patients for Virtual Visits (Telemedicine).  Patients are able to view lab/test results, encounter notes, upcoming appointments, etc.  Non-urgent messages can be sent to your provider as well.   To learn more about what you can do with MyChart, go to [email or web address].    Your next appointment:   KEEP SCHEDULED FOLLOW-UP  Other Instructions Please check your blood pressure 1-2 times and your systolic number (top #) is 140 or greater continuously, please call our office to let us know.  DASH Eating Plan DASH stands for Dietary Approaches to Stop Hypertension. The DASH eating plan is a healthy eating plan that has been shown to: Reduce high blood pressure  (hypertension). Reduce your risk for type 2 diabetes, heart disease, and stroke. Help with weight loss. What are tips for following this plan? Reading food labels Check food labels for the amount of salt (sodium) per serving. Choose foods with less than 5 percent of the Daily Value of sodium. Generally, foods with less than 300 milligrams (mg) of sodium per serving fit into this eating plan. To find whole grains, look for the word "whole" as the first word in the ingredient list. Shopping Buy products labeled as "low-sodium" or "no salt added." Buy fresh foods. Avoid canned foods and pre-made or frozen meals. Cooking Avoid adding salt when cooking. Use salt-free seasonings or herbs instead of table salt or sea salt. Check with your health care provider or pharmacist before using salt substitutes. Do not fry foods. Cook foods using healthy methods such as baking, boiling, grilling, roasting, and broiling instead. Cook with heart-healthy oils, such as olive, canola, avocado, soybean, or sunflower oil. Meal planning  Eat a balanced diet that includes: 4 or more servings of fruits and 4 or more servings of vegetables each day. Try to fill one-half of your plate with fruits and vegetables. 6-8 servings of whole grains each day. Less than 6 oz (170 g) of lean meat, poultry, or fish each day. A 3-oz (85-g) serving of meat is about the same size as a deck of cards. One egg equals 1 oz (28 g). 2-3 servings of low-fat dairy each day. One serving is 1 cup (237 mL). 1 serving of nuts, seeds, or beans 5 times each week. 2-3  servings of heart-healthy fats. Healthy fats called omega-3 fatty acids are found in foods such as walnuts, flaxseeds, fortified milks, and eggs. These fats are also found in cold-water fish, such as sardines, salmon, and mackerel. Limit how much you eat of: Canned or prepackaged foods. Food that is high in trans fat, such as some fried foods. Food that is high in saturated fat, such  as fatty meat. Desserts and other sweets, sugary drinks, and other foods with added sugar. Full-fat dairy products. Do not salt foods before eating. Do not eat more than 4 egg yolks a week. Try to eat at least 2 vegetarian meals a week. Eat more home-cooked food and less restaurant, buffet, and fast food. Lifestyle When eating at a restaurant, ask that your food be prepared with less salt or no salt, if possible. If you drink alcohol: Limit how much you use to: 0-1 drink a day for women who are not pregnant. 0-2 drinks a day for men. Be aware of how much alcohol is in your drink. In the [place] one drink equals one 12 oz bottle of beer (355 mL), one 5 oz glass of wine (148 mL), or one 1 oz glass of hard liquor (44 mL). General information Avoid eating more than 2,300 mg of salt a day. If you have hypertension, you may need to reduce your sodium intake to 1,500 mg a day. Work with your health care provider to maintain a healthy body weight or to lose weight. Ask what an ideal weight is for you. Get at least 30 minutes of exercise that causes your heart to beat faster (aerobic exercise) most days of the week. Activities may include walking, swimming, or biking. Work with your health care provider or dietitian to adjust your eating plan to your individual calorie needs. What foods should I eat? Fruits All fresh, dried, or frozen fruit. Canned fruit in natural juice (without added sugar). Vegetables Fresh or frozen vegetables (raw, steamed, roasted, or grilled). Low-sodium or reduced-sodium tomato and vegetable juice. Low-sodium or reduced-sodium tomato sauce and tomato paste. Low-sodium or reduced-sodium canned vegetables. Grains Whole-grain or whole-wheat bread. Whole-grain or whole-wheat pasta. Brown rice. Orpah Cobb. Bulgur. Whole-grain and low-sodium cereals. Pita bread. Low-fat, low-sodium crackers. Whole-wheat flour tortillas. Meats and other proteins Skinless chicken or [place].  Ground chicken or [place]. Pork with fat trimmed off. Fish and seafood. Egg whites. Dried beans, peas, or lentils. Unsalted nuts, nut butters, and seeds. Unsalted canned beans. Lean cuts of beef with fat trimmed off. Low-sodium, lean precooked or cured meat, such as sausages or meat loaves. Dairy Low-fat (1%) or fat-free (skim) milk. Reduced-fat, low-fat, or fat-free cheeses. Nonfat, low-sodium ricotta or cottage cheese. Low-fat or nonfat yogurt. Low-fat, low-sodium cheese. Fats and oils Soft margarine without trans fats. Vegetable oil. Reduced-fat, low-fat, or light mayonnaise and salad dressings (reduced-sodium). Canola, safflower, olive, avocado, soybean, and sunflower oils. Avocado. Seasonings and condiments Herbs. Spices. Seasoning mixes without salt. Other foods Unsalted popcorn and pretzels. Fat-free sweets. The items listed above may not be a complete list of foods and beverages you can eat. Contact a dietitian for more information. What foods should I avoid? Fruits Canned fruit in a light or heavy syrup. Fried fruit. Fruit in cream or butter sauce. Vegetables Creamed or fried vegetables. Vegetables in a cheese sauce. Regular canned vegetables (not low-sodium or reduced-sodium). Regular canned tomato sauce and paste (not low-sodium or reduced-sodium). Regular tomato and vegetable juice (not low-sodium or reduced-sodium). Rosita Fire. Olives. Grains Baked goods made with  fat, such as croissants, muffins, or some breads. Dry pasta or rice meal packs. Meats and other proteins Fatty cuts of meat. Ribs. Fried meat. Tomasa Blase. Bologna, salami, and other precooked or cured meats, such as sausages or meat loaves. Fat from the back of a pig (fatback). Bratwurst. Salted nuts and seeds. Canned beans with added salt. Canned or smoked fish. Whole eggs or egg yolks. Chicken or [place] with skin. Dairy Whole or 2% milk, cream, and half-and-half. Whole or full-fat cream cheese. Whole-fat or sweetened yogurt.  Full-fat cheese. Nondairy creamers. Whipped toppings. Processed cheese and cheese spreads. Fats and oils Butter. Stick margarine. Lard. Shortening. Ghee. Bacon fat. Tropical oils, such as coconut, palm kernel, or palm oil. Seasonings and condiments Onion salt, garlic salt, seasoned salt, table salt, and sea salt. Worcestershire sauce. Tartar sauce. Barbecue sauce. Teriyaki sauce. Soy sauce, including reduced-sodium. Steak sauce. Canned and packaged gravies. Fish sauce. Oyster sauce. Cocktail sauce. Store-bought horseradish. Ketchup. Mustard. Meat flavorings and tenderizers. Bouillon cubes. Hot sauces. Pre-made or packaged marinades. Pre-made or packaged taco seasonings. Relishes. Regular salad dressings. Other foods Salted popcorn and pretzels. The items listed above may not be a complete list of foods and beverages you should avoid. Contact a dietitian for more information. Where to find more information National Heart, Lung, and Blood Institute: PopSteam.is American Heart Association: www.heart.org Academy of Nutrition and Dietetics: www.eatright.org National Kidney Foundation: www.kidney.org Summary The DASH eating plan is a healthy eating plan that has been shown to reduce high blood pressure (hypertension). It may also reduce your risk for type 2 diabetes, heart disease, and stroke. When on the DASH eating plan, aim to eat more fresh fruits and vegetables, whole grains, lean proteins, low-fat dairy, and heart-healthy fats. With the DASH eating plan, you should limit salt (sodium) intake to 2,300 mg a day. If you have hypertension, you may need to reduce your sodium intake to 1,500 mg a day. Work with your health care provider or dietitian to adjust your eating plan to your individual calorie needs. This information is not intended to replace advice given to you by your health care provider. Make sure you discuss any questions you have with your health care provider. Document Revised:  11/11/2019 Document Reviewed: 11/11/2019 Elsevier Patient Education  2023 Elsevier Inc.  Heart-Healthy Eating Plan Eating a healthy diet is important for the health of your heart. A heart-healthy eating plan includes: Eating less unhealthy fats. Eating more healthy fats. Eating less salt in your food. Salt is also called sodium. Making other changes in your diet. Talk with your doctor or a diet specialist (dietitian) to create an eating plan that is right for you. What is my plan? Your doctor may recommend an eating plan that includes: Total fat: ______% or less of total calories a day. Saturated fat: ______% or less of total calories a day. Cholesterol: less than _________mg a day. Sodium: less than _________mg a day. What are tips for following this plan? Cooking Avoid frying your food. Try to bake, boil, grill, or broil it instead. You can also reduce fat by: Removing the skin from poultry. Removing all visible fats from meats. Steaming vegetables in water or broth. Meal planning  At meals, divide your plate into four equal parts: Fill one-half of your plate with vegetables and green salads. Fill one-fourth of your plate with whole grains. Fill one-fourth of your plate with lean protein foods. Eat 2-4 cups of vegetables per day. One cup of vegetables is: 1 cup (91 g)  broccoli or cauliflower florets. 2 medium carrots. 1 large bell pepper. 1 large sweet potato. 1 large tomato. 1 medium white potato. 2 cups (150 g) raw leafy greens. Eat 1-2 cups of fruit per day. One cup of fruit is: 1 small apple 1 large banana 1 cup (237 g) mixed fruit, 1 large orange,  cup (82 g) dried fruit, 1 cup (240 mL) 100% fruit juice. Eat more foods that have soluble fiber. These are apples, broccoli, carrots, beans, peas, and barley. Try to get 20-30 g of fiber per day. Eat 4-5 servings of nuts, legumes, and seeds per week: 1 serving of dried beans or legumes equals  cup (90 g) cooked. 1  serving of nuts is  oz (12 almonds, 24 pistachios, or 7 walnut halves). 1 serving of seeds equals  oz (8 g). General information Eat more home-cooked food. Eat less restaurant, buffet, and fast food. Limit or avoid alcohol. Limit foods that are high in starch and sugar. Avoid fried foods. Lose weight if you are overweight. Keep track of how much salt (sodium) you eat. This is important if you have high blood pressure. Ask your doctor to tell you more about this. Try to add vegetarian meals each week. Fats Choose healthy fats. These include olive oil and canola oil, flaxseeds, walnuts, almonds, and seeds. Eat more omega-3 fats. These include salmon, mackerel, sardines, tuna, flaxseed oil, and ground flaxseeds. Try to eat fish at least 2 times each week. Check food labels. Avoid foods with trans fats or high amounts of saturated fat. Limit saturated fats. These are often found in animal products, such as meats, butter, and cream. These are also found in plant foods, such as palm oil, palm kernel oil, and coconut oil. Avoid foods with partially hydrogenated oils in them. These have trans fats. Examples are stick margarine, some tub margarines, cookies, crackers, and other baked goods. What foods should I eat? Fruits All fresh, canned (in natural juice), or frozen fruits. Vegetables Fresh or frozen vegetables (raw, steamed, roasted, or grilled). Green salads. Grains Most grains. Choose whole wheat and whole grains most of the time. Rice and pasta, including brown rice and pastas made with whole wheat. Meats and other proteins Lean, well-trimmed beef, veal, pork, and lamb. Chicken and [place] without skin. All fish and shellfish. Wild duck, rabbit, pheasant, and venison. Egg whites or low-cholesterol egg substitutes. Dried beans, peas, lentils, and tofu. Seeds and most nuts. Dairy Low-fat or nonfat cheeses, including ricotta and mozzarella. Skim or 1% milk that is liquid, powdered, or  evaporated. Buttermilk that is made with low-fat milk. Nonfat or low-fat yogurt. Fats and oils Non-hydrogenated (trans-free) margarines. Vegetable oils, including soybean, sesame, sunflower, olive, peanut, safflower, corn, canola, and cottonseed. Salad dressings or mayonnaise made with a vegetable oil. Beverages Mineral water. Coffee and tea. Diet carbonated beverages. Sweets and desserts Sherbet, gelatin, and fruit ice. Small amounts of dark chocolate. Limit all sweets and desserts. Seasonings and condiments All seasonings and condiments. The items listed above may not be a complete list of foods and drinks you can eat. Contact a dietitian for more options. What foods should I avoid? Fruits Canned fruit in heavy syrup. Fruit in cream or butter sauce. Fried fruit. Limit coconut. Vegetables Vegetables cooked in cheese, cream, or butter sauce. Fried vegetables. Grains Breads that are made with saturated or trans fats, oils, or whole milk. Croissants. Sweet rolls. Donuts. High-fat crackers, such as cheese crackers. Meats and other proteins Fatty meats, such as hot dogs,  ribs, sausage, bacon, rib-eye roast or steak. High-fat deli meats, such as salami and bologna. Caviar. Domestic duck and goose. Organ meats, such as liver. Dairy Cream, sour cream, cream cheese, and creamed cottage cheese. Whole-milk cheeses. Whole or 2% milk that is liquid, evaporated, or condensed. Whole buttermilk. Cream sauce or high-fat cheese sauce. Yogurt that is made from whole milk. Fats and oils Meat fat, or shortening. Cocoa butter, hydrogenated oils, palm oil, coconut oil, palm kernel oil. Solid fats and shortenings, including bacon fat, salt pork, lard, and butter. Nondairy cream substitutes. Salad dressings with cheese or sour cream. Beverages Regular sodas and juice drinks with added sugar. Sweets and desserts Frosting. Pudding. Cookies. Cakes. Pies. Milk chocolate or white chocolate. Buttered syrups. Full-fat  ice cream or ice cream drinks. The items listed above may not be a complete list of foods and drinks to avoid. Contact a dietitian for more information. Summary Heart-healthy meal planning includes eating less unhealthy fats, eating more healthy fats, and making other changes in your diet. Eat a balanced diet. This includes fruits and vegetables, low-fat or nonfat dairy, lean protein, nuts and legumes, whole grains, and heart-healthy oils and fats. This information is not intended to replace advice given to you by your health care provider. Make sure you discuss any questions you have with your health care provider. Document Revised: 01/13/2022 Document Reviewed: 01/13/2022 Elsevier Patient Education  2023 [employer].
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
## 2023-05-07 ENCOUNTER — Encounter: Payer: Self-pay | Admitting: Neurology

## 2023-05-07 ENCOUNTER — Ambulatory Visit (INDEPENDENT_AMBULATORY_CARE_PROVIDER_SITE_OTHER): Payer: 59 | Admitting: Neurology

## 2023-05-07 VITALS — BP 142/90 | HR 93 | Ht 66.0 in | Wt 257.0 lb

## 2023-05-07 DIAGNOSIS — R42 Dizziness and giddiness: Secondary | ICD-10-CM | POA: Diagnosis not present

## 2023-05-07 DIAGNOSIS — M542 Cervicalgia: Secondary | ICD-10-CM

## 2023-05-07 DIAGNOSIS — I428 Other cardiomyopathies: Secondary | ICD-10-CM

## 2023-05-07 DIAGNOSIS — E538 Deficiency of other specified B group vitamins: Secondary | ICD-10-CM | POA: Diagnosis not present

## 2023-05-07 DIAGNOSIS — R519 Headache, unspecified: Secondary | ICD-10-CM | POA: Diagnosis not present

## 2023-05-07 DIAGNOSIS — H81399 Other peripheral vertigo, unspecified ear: Secondary | ICD-10-CM

## 2023-05-07 DIAGNOSIS — I340 Nonrheumatic mitral (valve) insufficiency: Secondary | ICD-10-CM

## 2023-05-07 MED ORDER — TOPIRAMATE 25 MG PO TABS: 25.0000 mg | ORAL_TABLET | Freq: Every day | ORAL | 3 refills | Status: DC

## 2023-05-07 NOTE — Patient Instructions (Addendum)
Take topamax 25 mg every day to help prevent headaches.  Discussed importance of taking cardiac medications. Blood pressure was elevated today. Recommended you discuss with cardiology if your blood pressure stays elevated at home.  I want to see you back in 6 months or sooner if needed.  The physicians and staff at [facility] are committed to providing excellent care. You may receive a survey requesting feedback about your experience at our office. We strive to receive "very good" responses to the survey questions. If you feel that your experience would prevent you from giving the office a "very good " response, please contact our office to try to remedy the situation. We may be reached at [phone]. Thank you for taking the time out of your busy day to complete the survey.  Jacquelyne Balint, MD [facility]

## 2023-05-13 ENCOUNTER — Ambulatory Visit: Payer: 59 | Admitting: Neurology

## 2023-05-26 ENCOUNTER — Encounter: Payer: Self-pay | Admitting: *Deleted

## 2023-07-03 ENCOUNTER — Encounter: Payer: Self-pay | Admitting: Cardiovascular Disease

## 2023-07-03 ENCOUNTER — Ambulatory Visit: Payer: 59 | Attending: Cardiovascular Disease | Admitting: Cardiovascular Disease

## 2023-07-03 VITALS — BP 128/96 | HR 87 | Ht 66.0 in | Wt 248.2 lb

## 2023-07-03 DIAGNOSIS — I10 Essential (primary) hypertension: Secondary | ICD-10-CM | POA: Diagnosis not present

## 2023-07-03 DIAGNOSIS — I428 Other cardiomyopathies: Secondary | ICD-10-CM

## 2023-07-03 DIAGNOSIS — I340 Nonrheumatic mitral (valve) insufficiency: Secondary | ICD-10-CM

## 2023-07-03 DIAGNOSIS — I471 Supraventricular tachycardia, unspecified: Secondary | ICD-10-CM

## 2023-07-03 NOTE — Patient Instructions (Signed)
Medication Instructions:  No changes *If you need a refill on your cardiac medications before your next appointment, please call your pharmacy*   Lab Work: none If you have labs (blood work) drawn today and your tests are completely normal, you will receive your results only by: MyChart Message (if you have MyChart) OR A paper copy in the mail If you have any lab test that is abnormal or we need to change your treatment, we will call you to review the results.   Testing/Procedures: ECHO DUE IN MARCH 2025 Your physician has requested that you have an echocardiogram. Echocardiography is a painless test that uses sound waves to create images of your heart. It provides your doctor with information about the size and shape of your heart and how well your heart's chambers and valves are working. This procedure takes approximately one hour. There are no restrictions for this procedure. Please do NOT wear cologne, perfume, aftershave, or lotions (deodorant is allowed). Please arrive 15 minutes prior to your appointment time.    Follow-Up: At [facility], you and your health needs are our priority.  As part of our continuing mission to provide you with exceptional heart care, we have created designated Provider Care Teams.  These Care Teams include your primary Cardiologist (physician) and Advanced Practice Providers (APPs -  Physician Assistants and Nurse Practitioners) who all work together to provide you with the care you need, when you need it.  We recommend signing up for the patient portal called "MyChart".  Sign up information is provided on this After Visit Summary.  MyChart is used to connect with patients for Virtual Visits (Telemedicine).  Patients are able to view lab/test results, encounter notes, upcoming appointments, etc.  Non-urgent messages can be sent to your provider as well.   To learn more about what you can do with MyChart, go to [email or web address].    Your next  appointment:   9 month(s)  Provider:   Verne Carrow, MD

## 2023-07-03 NOTE — Progress Notes (Signed)
Chief Complaint  Patient presents with   Follow-up    Mitral regurgitation    History of Present Illness: 57 yo female with history of anemia, HTN, obesity and tobacco abuse who is here today for follow up. I saw her as a new patient for the evaluation of palpitations in December 2022. She was seen in the [facility] 11/13/21 with palpitations and chest tightness that occurred after climbing stairs. She was restarted on atenolol but she had not started this yet when I saw her.  She had recurrent palpitations the day before her visit here that lasted for several seconds. She has had some dyspnea but no chest pain. Echo January 2023 with low normal LV systolic function, LVEF=50%. No regional wall motion abnormalities. Moderate mitral regurgitation. Cardiac monitor with sinus, 2 beat runs of VT and 9 runs of SVT, longest 16 seconds. Echo 01/29/23 with LVEF around 45-50% and moderate to severe MR. TEE March 2024 with moderate MR with LVEF=55-60%. Right and left heart catheterization 03/05/23 with mildly elevated LVEDP, mild pulmonary HTN and no evidence of CAD.   She is here today for follow up. The patient denies any chest pain, dyspnea, palpitations, lower extremity edema, orthopnea, PND, dizziness, near syncope or syncope.   Primary Care Physician: Carmina Miller, DO   Past Medical History:  Diagnosis Date   Anemia    COVID-19 virus infection 11/2020   Fibroids    Heart palpitations    History of    History of blood transfusion    with knee replacement   History of colon polyps    Hypertension    Hypertension    no current meds for htn   Knee joint injury    Morbid obesity (HCC)    Pneumonia    remote history   Thyroid goiter    bilateral goiter and cyst follow up every 2 years Dr. Lazarus Salines   Tobacco abuse     Past Surgical History:  Procedure Laterality Date   ABDOMINAL AORTOGRAM N/A 03/05/2023   Procedure: ABDOMINAL AORTOGRAM;  Surgeon: Corky Crafts, MD;  Location:  [facility] INVASIVE CV LAB;  Service: Cardiovascular;  Laterality: N/A;   ANKLE SURGERY Right 2009   broken ankle    arthroscopic surgery to knee Bilateral    BREAST BIOPSY Left 03/24/2019   BUBBLE STUDY  03/05/2023   Procedure: BUBBLE STUDY;  Surgeon: Sande Rives, MD;  Location: [facility] ENDOSCOPY;  Service: Cardiovascular;;   CESAREAN SECTION     COLONOSCOPY     HAND SURGERY Bilateral    JOINT REPLACEMENT Bilateral    knees   REPLACEMENT TOTAL KNEE Right 2012   RIGHT/LEFT HEART CATH AND CORONARY ANGIOGRAPHY N/A 03/05/2023   Procedure: RIGHT/LEFT HEART CATH AND CORONARY ANGIOGRAPHY;  Surgeon: Corky Crafts, MD;  Location: [facility] INVASIVE CV LAB;  Service: Cardiovascular;  Laterality: N/A;   SHOULDER OPEN ROTATOR CUFF REPAIR Right 10/12/2019   Procedure: Right shoulder rotator cuff repair with graft and anchors;  Surgeon: Ranee Gosselin, MD;  Location: [facility] ORS;  Service: Orthopedics;  Laterality: Right;    TEE WITHOUT CARDIOVERSION N/A 03/05/2023   Procedure: TRANSESOPHAGEAL ECHOCARDIOGRAM (TEE);  Surgeon: Sande Rives, MD;  Location: [facility] ENDOSCOPY;  Service: Cardiovascular;  Laterality: N/A;   TOTAL KNEE ARTHROPLASTY Left 01/03/2014   Procedure: LEFT TOTAL KNEE ARTHROPLASTY;  Surgeon: Jacki Cones, MD;  Location: [facility] ORS;  Service: Orthopedics;  Laterality: Left;   TUBAL LIGATION      Current Outpatient Medications  Medication Sig Dispense Refill   furosemide (LASIX) 40 MG tablet Take 1 tablet (40 mg total) by mouth daily. 90 tablet 1   lisinopril (ZESTRIL) 5 MG tablet Take 1 tablet (5 mg total) by mouth daily. Please keep scheduled appointment for future refills. Thank you. 90 tablet 0   metoprolol succinate (TOPROL-XL) 25 MG 24 hr tablet TAKE 1 TABLET(25 MG) BY MOUTH DAILY. MAY TAKE 1 EXTRA TABLET AS NEEDED FOR HEART RACING 180 tablet 3   topiramate (TOPAMAX) 25 MG tablet Take 1 tablet (25 mg total) by mouth at bedtime. 30 tablet 3   No current facility-administered  medications for this visit.    Allergies  Allergen Reactions   Tramadol Itching   Ibuprofen Nausea And Vomiting   Metronidazole Itching and Nausea And Vomiting   Penicillins Itching    Denies hives, or airway issues.     Social History   Socioeconomic History   Marital status: Single    Spouse name: Not on file   Number of children: 2   Years of education: Not on file   Highest education level: Not on file  Occupational History   Occupation: disabled    Employer: DISABLED   Tobacco Use   Smoking status: Every Day    Current packs/day: 0.20    Average packs/day: 0.2 packs/day for 20.0 years (4.0 ttl pk-yrs)    Types: Cigarettes   Smokeless tobacco: Never   Tobacco comments:    1-2  cigs per day/ a pack in 2 weeks 10/18  Vaping Use   Vaping status: Never Used  Substance and Sexual Activity   Alcohol use: Yes    Alcohol/week: 2.0 standard drinks of alcohol    Types: 2 Standard drinks or equivalent per week    Comment: occasionally once per month   Drug use: No   Sexual activity: Yes    Birth control/protection: Surgical  Other Topics Concern   Not on file  Social History Narrative   ** Merged History Encounter ** Applying for disability, previously worked in [employer]   Right handed   Caffeine rarely   Live in two story    Social Determinants of Health   Financial Resource Strain: Not on file  Food Insecurity: Not on file  Transportation Needs: Not on file  Physical Activity: Not on file  Stress: Not on file  Social Connections: Not on file  Intimate Partner Violence: Not on file    Family History  Problem Relation Age of Onset   Hypertension Mother    Dementia Mother    Hypertension Father    Atrial fibrillation Father    Cancer Maternal Grandmother        ? lung    Review of Systems:  As stated in the HPI and otherwise negative.   BP (!) 128/96   Pulse 87   Ht 5\' 6"  (1.676 m)   Wt 112.6 kg   LMP 06/07/2016   SpO2 98%   BMI 40.06 kg/m    Physical Examination: General: Well developed, well nourished, NAD  HEENT: OP clear, mucus membranes moist  SKIN: warm, dry. No rashes. Neuro: No focal deficits  Musculoskeletal: Muscle strength 5/5 all ext  Psychiatric: Mood and affect normal  Neck: No JVD, no carotid bruits, no thyromegaly, no lymphadenopathy.  Lungs:Clear bilaterally, no wheezes, rhonci, crackles Cardiovascular: Regular rate and rhythm. Systolic murmur.  Abdomen:Soft. Bowel sounds present. Non-tender.  Extremities: No lower extremity edema. Pulses are 2 + in the bilateral DP/PT.  EKG:  EKG is not ordered today. The ekg ordered today demonstrates   Echo 01/29/23:  1. Global hypokinesis worse in the posterior basal segment EF similar to  TTE done 01/01/22 . Left ventricular ejection fraction, by estimation, is  45 to 50%. The left ventricle has mildly decreased function. The left  ventricle demonstrates global  hypokinesis. The left ventricular internal cavity size was moderately  dilated. There is mild left ventricular hypertrophy. Left ventricular  diastolic parameters were normal.   2. Right ventricular systolic function is normal. The right ventricular  size is normal. There is normal pulmonary artery systolic pressure.   3. Left atrial size was moderately dilated.   4. With splay artifact likely funcitonal from reduced EF and atrial  enlargement MR appears worse than TTE done 01/01/22 consider further w/u  including right / left cath and TEE if clinically indicated . The mitral  valve is abnormal. Moderate to severe  mitral valve regurgitation. No evidence of mitral stenosis.   5. The aortic valve is tricuspid. Aortic valve regurgitation is not  visualized. No aortic stenosis is present.   6. The inferior vena cava is normal in size with greater than 50%  respiratory variability, suggesting right atrial pressure of 3 mmHg.   Recent Labs: 07/24/2022: ALT 14 02/04/2023: BUN 21; Creatinine, Ser 1.00; Platelets  178 03/05/2023: Hemoglobin 11.6; Hemoglobin 11.6; Potassium 4.0; Potassium 4.0; Sodium 140; Sodium 140   Lipid Panel    Component Value Date/Time   CHOL 113 10/18/2009 2100   TRIG 36 10/18/2009 2100   HDL 54 10/18/2009 2100   CHOLHDL 2.1 Ratio 10/18/2009 2100   VLDL 7 10/18/2009 2100   LDLCALC 52 10/18/2009 2100     Wt Readings from Last 3 Encounters:  07/03/23 112.6 kg  05/07/23 116.6 kg  04/28/23 102.9 kg    Assessment and Plan:   1. Palpitations/SVT: Rare palpitations. Continue Toprol.   2. Mitral regurgitation: Moderate MR by TEE March 2024. Will repeat echo in March 2025.   3. Non-ischemic cardiomyopathyChronic systolic CHF: LVEF=55-60% by TEE in march 2024. Continue Toprol and Lisinopril. Weight is stable on daily Lasix.   4. HTN: BP is controlled at home. NO changes.   Labs/ tests ordered today include:   Orders Placed This Encounter  Procedures   ECHOCARDIOGRAM COMPLETE   Disposition:   F/U with me one year  Signed, Verne Carrow, MD 07/03/2023 3:26 PM    [facility] [address] [place], [place], [place]  [address] Phone: [phone]; Fax: [phone]

## 2023-07-27 ENCOUNTER — Other Ambulatory Visit: Payer: Self-pay

## 2023-07-27 ENCOUNTER — Ambulatory Visit (INDEPENDENT_AMBULATORY_CARE_PROVIDER_SITE_OTHER): Payer: 59 | Admitting: Student

## 2023-07-27 ENCOUNTER — Encounter: Payer: Self-pay | Admitting: Student

## 2023-07-27 VITALS — BP 151/96 | HR 85 | Temp 98.1°F | Ht 66.0 in | Wt 254.6 lb

## 2023-07-27 DIAGNOSIS — G8929 Other chronic pain: Secondary | ICD-10-CM

## 2023-07-27 DIAGNOSIS — M5489 Other dorsalgia: Secondary | ICD-10-CM

## 2023-07-27 DIAGNOSIS — M549 Dorsalgia, unspecified: Secondary | ICD-10-CM | POA: Diagnosis not present

## 2023-07-27 MED ORDER — TIZANIDINE HCL 4 MG PO TABS: 4.0000 mg | ORAL_TABLET | Freq: Three times a day (TID) | ORAL | 0 refills | Status: DC

## 2023-07-27 MED ORDER — ACETAMINOPHEN 500 MG PO TABS: 1000.0000 mg | ORAL_TABLET | Freq: Three times a day (TID) | ORAL | 0 refills | Status: AC | PRN

## 2023-07-27 MED ORDER — LIDOCAINE 4 % EX PTCH: 1.0000 | MEDICATED_PATCH | CUTANEOUS | 0 refills | Status: DC

## 2023-07-27 NOTE — Assessment & Plan Note (Signed)
Patient endorses back pain for the past few months after getting a new bed. The pain is further described as sharp, tightening, midline, with radiation up/down back. No alleviating factors and exacerbated by movement. On the exam, both flexion and extension exacerbate the pain. There is tenderness to palpation throughout upper/mid back with paraspinal hypertonicity. 5/5 strength in upper/lower extremity. Suspect this is a muscular strain. Patient has a PT appointment already set up for tomorrow. -Order Lidocaine patches, Tylenol 1000 mg/TID, and Tizanidine  4 mg/TID  -Follow-up in one month

## 2023-07-27 NOTE — Progress Notes (Signed)
CC: Back pain  HPI:  [name] is a 57 y.o. female living with a history stated below and presents today for back pain. Please see problem based assessment and plan for additional details.  Past Medical History:  Diagnosis Date   Anemia    COVID-19 virus infection 11/2020   Fibroids    Heart palpitations    History of    History of blood transfusion    with knee replacement   History of colon polyps    Hypertension    Hypertension    no current meds for htn   Knee joint injury    Morbid obesity (HCC)    Pneumonia    remote history   Thyroid goiter    bilateral goiter and cyst follow up every 2 years Dr. Lazarus Salines   Tobacco abuse     Current Outpatient Medications on File Prior to Visit  Medication Sig Dispense Refill   furosemide (LASIX) 40 MG tablet Take 1 tablet (40 mg total) by mouth daily. 90 tablet 1   lisinopril (ZESTRIL) 5 MG tablet Take 1 tablet (5 mg total) by mouth daily. Please keep scheduled appointment for future refills. Thank you. 90 tablet 0   metoprolol succinate (TOPROL-XL) 25 MG 24 hr tablet TAKE 1 TABLET(25 MG) BY MOUTH DAILY. MAY TAKE 1 EXTRA TABLET AS NEEDED FOR HEART RACING 180 tablet 3   topiramate (TOPAMAX) 25 MG tablet Take 1 tablet (25 mg total) by mouth at bedtime. 30 tablet 3   No current facility-administered medications on file prior to visit.    Family History  Problem Relation Age of Onset   Hypertension Mother    Dementia Mother    Hypertension Father    Atrial fibrillation Father    Cancer Maternal Grandmother        ? lung    Social History   Socioeconomic History   Marital status: Single    Spouse name: Not on file   Number of children: 2   Years of education: Not on file   Highest education level: Not on file  Occupational History   Occupation: disabled    Employer: DISABLED   Tobacco Use   Smoking status: Every Day    Current packs/day: 0.20    Average packs/day: 0.2 packs/day for 20.0 years (4.0 ttl  pk-yrs)    Types: Cigarettes   Smokeless tobacco: Never   Tobacco comments:    1-2  cigs per day/ a pack in 2 weeks 10/18  Vaping Use   Vaping status: Never Used  Substance and Sexual Activity   Alcohol use: Yes    Alcohol/week: 2.0 standard drinks of alcohol    Types: 2 Standard drinks or equivalent per week    Comment: occasionally once per month   Drug use: No   Sexual activity: Yes    Birth control/protection: Surgical  Other Topics Concern   Not on file  Social History Narrative   ** Merged History Encounter ** Applying for disability, previously worked in [employer]   Right handed   Caffeine rarely   Live in two story    Social Determinants of Health   Financial Resource Strain: Not on file  Food Insecurity: Not on file  Transportation Needs: Not on file  Physical Activity: Not on file  Stress: Not on file  Social Connections: Not on file  Intimate Partner Violence: Not on file    Review of Systems: ROS negative except for what is noted on the assessment  and plan.  Vitals:   07/27/23 1542  BP: (!) 151/96  Pulse: 85  Temp: 98.1 F (36.7 C)  TempSrc: Oral  SpO2: 98%  Weight: 254 lb 9.6 oz (115.5 kg)  Height: 5\' 6"  (1.676 m)    Physical Exam: Cardiovascular: regular rate and rhythm, no m/r/g Pulmonary/Chest: normal work of breathing on room air, lungs clear to auscultation bilaterally MSK: normal bulk and tone; 5/5 strength in upper/lower extremities, tenderness to palpation along upper to mid back. Pain exacerbated with flexion and extension.   Assessment & Plan:     Patient seen with [name]  Back pain Patient endorses back pain for the past few months after getting a new bed. The pain is further described as sharp, tightening, midline, with radiation up/down back. No alleviating factors and exacerbated by movement. On the exam, both flexion and extension exacerbate the pain. There is tenderness to palpation throughout upper/mid back with paraspinal  hypertonicity. 5/5 strength in upper/lower extremity. Suspect this is a muscular strain. Patient has a PT appointment already set up for tomorrow. -Order Lidocaine patches, Tylenol 1000 mg/TID, and Tizanidine  4 mg/TID  -Follow-up in one month   Carmina Miller, D.O. [facility] Internal Medicine, PGY-1 Phone: [phone] Date 07/27/2023 Time 5:00 PM

## 2023-07-27 NOTE — Patient Instructions (Addendum)
Thank you for allowing me to be a part of your care team. Today we discussed a few things:  I have ordered a lidocaine patch, extra strength Tylenol, and a muscle relaxer (Tizanidine) for your back pain. Please only use the muscle relaxer for breakthrough pain that is not relieved by the lidocaine and Tylenol.  2. Tizanidine can cause drowsiness so please avoid driving when using this medication.   3. Please follow-up with physical therapy for further

## 2023-07-28 DIAGNOSIS — M545 Low back pain, unspecified: Secondary | ICD-10-CM | POA: Diagnosis not present

## 2023-07-29 ENCOUNTER — Ambulatory Visit (HOSPITAL_COMMUNITY): Payer: 59 | Attending: Cardiovascular Disease

## 2023-07-29 ENCOUNTER — Telehealth: Payer: Self-pay | Admitting: *Deleted

## 2023-07-29 DIAGNOSIS — I428 Other cardiomyopathies: Secondary | ICD-10-CM | POA: Insufficient documentation

## 2023-07-29 DIAGNOSIS — I340 Nonrheumatic mitral (valve) insufficiency: Secondary | ICD-10-CM | POA: Diagnosis not present

## 2023-07-29 LAB — ECHOCARDIOGRAM COMPLETE
Area-P 1/2: 3.01 cm2
S' Lateral: 4.3 cm

## 2023-07-29 NOTE — Telephone Encounter (Signed)
Patient would like a call to discuss questions about prescriptions

## 2023-07-29 NOTE — Telephone Encounter (Signed)
Left message to call back  

## 2023-07-30 NOTE — Addendum Note (Signed)
Addended by: Dickie La on: 07/30/2023 11:17 AM   Modules accepted: Level of Service

## 2023-07-30 NOTE — Progress Notes (Addendum)
Internal Medicine Clinic Attending  I was physically present during the key portions of the resident provided service and participated in the medical decision making of patient's management care. I reviewed pertinent patient test results.  The assessment, diagnosis, and plan were formulated together and I agree with the documentation in the resident's note.  Diffuse, bilateral muscular back pain concerning for muscle strain with spasm. We discussed trial of physical therapy, acetaminophen, topical therapy, and short-course of muscle relaxant.   Dickie La, MD

## 2023-08-03 ENCOUNTER — Ambulatory Visit (HOSPITAL_COMMUNITY)
Admission: EM | Admit: 2023-08-03 | Discharge: 2023-08-03 | Disposition: A | Discharge status: Home / Self Care | Payer: 59

## 2023-08-03 ENCOUNTER — Encounter (HOSPITAL_COMMUNITY): Payer: Self-pay

## 2023-08-03 DIAGNOSIS — T148XXA Other injury of unspecified body region, initial encounter: Secondary | ICD-10-CM | POA: Diagnosis not present

## 2023-08-03 LAB — POCT URINALYSIS DIP (MANUAL ENTRY)
Bilirubin, UA: NEGATIVE
Blood, UA: NEGATIVE
Glucose, UA: NEGATIVE mg/dL
Leukocytes, UA: NEGATIVE
Nitrite, UA: NEGATIVE
Protein Ur, POC: NEGATIVE mg/dL
Spec Grav, UA: >=1.03 — AB (ref 1.010–1.025)
Urobilinogen, UA: 1 E.U./dL
pH, UA: 6 (ref 5.0–8.0)

## 2023-08-03 NOTE — Discharge Instructions (Signed)
Take the medicine prescribed by the orthopedist per instructions and follow up with the physical therapist as scheduled.   Increase how much water you drink - your urine does not show an infection but it does show you are not drinking enough water.

## 2023-08-03 NOTE — ED Triage Notes (Addendum)
Back pain onset 2-3 weeks. All started after Patient bought a new mattress set. Having pain bilaterally and with deep breathing. Pain is in the mid back.   Patient has been diagnosed with arthritis and a sprain. Given a muscle relaxer with no relief.   Patient states her urine is also dark.

## 2023-08-03 NOTE — ED Provider Notes (Signed)
[facility]    CSN: [id number] Arrival date & time: 08/03/23  1301      History   Chief Complaint Chief Complaint  Patient presents with   Back Pain    HPI [name] is a 57 y.o. female. Pt reports back pain for over 3 weeks. Thinks is due to new mattresses. Saw PCP 07/27/23, he felt was muscle pain. Rx muscle relaxer. Pt has also seen ortho who did imaging and told pt she has arthritis, rx muscle relaxer and referred to PT. Pt has not had first PT session yet. Is not taking any of the medicines prescribed. Is worried that something is wrong with her lungs since it hurts her back to take a deep breath or something is wrong with her kidneys. Denies dysuria, fever. Deneis cough, SOB.    Back Pain   Past Medical History:  Diagnosis Date   Anemia    COVID-19 virus infection 11/2020   Fibroids    Heart palpitations    History of    History of blood transfusion    with knee replacement   History of colon polyps    Hypertension    Hypertension    no current meds for htn   Knee joint injury    Morbid obesity (HCC)    Pneumonia    remote history   Thyroid goiter    bilateral goiter and cyst follow up every 2 years Dr. Lazarus Salines   Tobacco abuse     Patient Active Problem List   Diagnosis Date Noted   Back pain 07/27/2023   Mitral valve insufficiency 03/05/2023   Breast pain, left 10/15/2022   Pelvic pain 07/31/2022   Dizziness 07/31/2022   Headache 05/30/2022   Recurrent sinusitis 11/28/2021   Routine screening for STI (sexually transmitted infection) 11/28/2021   Dysuria 11/28/2021   Tobacco abuse    COVID-19 virus infection 11/2020   Intermittent palpitations 11/17/2017   Menopause 06/23/2017   Hot flashes 09/23/2016   Healthcare maintenance 09/23/2016   S/P total knee arthroplasty, 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   Uterine fibroid 05/09/2013   Tobacco use disorder 04/12/2013   OA (osteoarthritis) 10/28/2011   Toxic multinodular goiter 09/17/2009   MORBID  OBESITY 09/17/2009   Essential hypertension 09/17/2009   Pain in limb 09/17/2009   ANEMIA, HX OF 09/17/2009    Past Surgical History:  Procedure Laterality Date   ABDOMINAL AORTOGRAM N/A 03/05/2023   Procedure: ABDOMINAL AORTOGRAM;  Surgeon: Corky Crafts, MD;  Location: [facility] INVASIVE CV LAB;  Service: Cardiovascular;  Laterality: N/A;   ANKLE SURGERY Right 2009   broken ankle    arthroscopic surgery to knee Bilateral    BREAST BIOPSY Left 03/24/2019   BUBBLE STUDY  03/05/2023   Procedure: BUBBLE STUDY;  Surgeon: Sande Rives, MD;  Location: [facility] ENDOSCOPY;  Service: Cardiovascular;;   CESAREAN SECTION     COLONOSCOPY     HAND SURGERY Bilateral    JOINT REPLACEMENT Bilateral    knees   REPLACEMENT TOTAL KNEE Right 2012   RIGHT/LEFT HEART CATH AND CORONARY ANGIOGRAPHY N/A 03/05/2023   Procedure: RIGHT/LEFT HEART CATH AND CORONARY ANGIOGRAPHY;  Surgeon: Corky Crafts, MD;  Location: [facility] INVASIVE CV LAB;  Service: Cardiovascular;  Laterality: N/A;   SHOULDER OPEN ROTATOR CUFF REPAIR Right 10/12/2019   Procedure: Right shoulder rotator cuff repair with graft and anchors;  Surgeon: Ranee Gosselin, MD;  Location: [facility] ORS;  Service: Orthopedics;  Laterality: Right;    TEE WITHOUT CARDIOVERSION  N/A 03/05/2023   Procedure: TRANSESOPHAGEAL ECHOCARDIOGRAM (TEE);  Surgeon: Sande Rives, MD;  Location: [facility] ENDOSCOPY;  Service: Cardiovascular;  Laterality: N/A;   TOTAL KNEE ARTHROPLASTY Left 01/03/2014   Procedure: LEFT TOTAL KNEE ARTHROPLASTY;  Surgeon: Jacki Cones, MD;  Location: [facility];  Service: Orthopedics;  Laterality: Left;   TUBAL LIGATION      OB History     Gravida  4   Para  2   Term  2   Preterm  0   AB  2   Living  2      SAB  1   IAB  1   Ectopic  0   Multiple      Live Births  2            Home Medications    Prior to Admission medications   Medication Sig Start Date End Date Taking? Authorizing Provider  furosemide  (LASIX) 40 MG tablet Take 1 tablet (40 mg total) by mouth daily. 02/05/23  Yes Kathleene Hazel, MD  lisinopril (ZESTRIL) 5 MG tablet Take 1 tablet (5 mg total) by mouth daily. Please keep scheduled appointment for future refills. Thank you. 03/31/23  Yes Kathleene Hazel, MD  methocarbamol (ROBAXIN) 500 MG tablet TAKE 1 TABLET BY MOUTH THREE TIMES DAILY FOR 10 DAYS AS NEEDED   Yes [provider]  metoprolol succinate (TOPROL-XL) 25 MG 24 hr tablet TAKE 1 TABLET(25 MG) BY MOUTH DAILY. MAY TAKE 1 EXTRA TABLET AS NEEDED FOR HEART RACING 03/31/23  Yes Kathleene Hazel, MD  predniSONE (DELTASONE) 10 MG tablet Take 6 tablets po x1 day, 5 tablets po x 1 day, 4 tablets po x 1 day, 3 tablets po x 1 day, 2 tablets po x 1 day, 1 tablet po x 1 day 07/28/23  Yes [provider]  topiramate (TOPAMAX) 25 MG tablet Take 1 tablet (25 mg total) by mouth at bedtime. 05/07/23  Yes Antony Madura, MD  acetaminophen (TYLENOL) 500 MG tablet Take 2 tablets (1,000 mg total) by mouth every 8 (eight) hours as needed for up to 14 days. 07/27/23 08/10/23  Carmina Miller, DO  lidocaine (HM LIDOCAINE PATCH) 4 % Place 1 patch onto the skin daily. 07/27/23   Carmina Miller, DO    Family History Family History  Problem Relation Age of Onset   Hypertension Mother    Dementia Mother    Hypertension Father    Atrial fibrillation Father    Cancer Maternal Grandmother        ? lung    Social History Social History   Tobacco Use   Smoking status: Every Day    Current packs/day: 0.20    Average packs/day: 0.2 packs/day for 20.0 years (4.0 ttl pk-yrs)    Types: Cigarettes   Smokeless tobacco: Never   Tobacco comments:    1-2  cigs per day/ a pack in 2 weeks 10/18  Vaping Use   Vaping status: Never Used  Substance Use Topics   Alcohol use: Yes    Alcohol/week: 2.0 standard drinks of alcohol    Types: 2 Standard drinks or equivalent per week    Comment: occasionally once per month   Drug use: No      Allergies   Tramadol, Ibuprofen, Metronidazole, and Penicillins   Review of Systems Review of Systems  Musculoskeletal:  Positive for back pain.     Physical Exam Triage Vital Signs ED Triage Vitals  Encounter Vitals Group  BP 08/03/23 1424 (!) 145/85     Systolic BP Percentile --      Diastolic BP Percentile --      Pulse Rate 08/03/23 1424 62     Resp 08/03/23 1424 18     Temp 08/03/23 1424 (!) 97.4 F (36.3 C)     Temp Source 08/03/23 1424 Oral     SpO2 08/03/23 1424 97 %     Weight 08/03/23 1424 253 lb (114.8 kg)     Height 08/03/23 1424 5\' 6"  (1.676 m)     Head Circumference --      Peak Flow --      Pain Score 08/03/23 1422 8     Pain Loc --      Pain Education --      Exclude from Growth Chart --    No data found.  Updated Vital Signs BP (!) 145/85 (BP Location: Right Arm)   Pulse 62   Temp (!) 97.4 F (36.3 C) (Oral)   Resp 18   Ht 5\' 6"  (1.676 m)   Wt 253 lb (114.8 kg)   LMP 06/07/2016   SpO2 97%   BMI 40.84 kg/m   Visual Acuity Right Eye Distance:   Left Eye Distance:   Bilateral Distance:    Right Eye Near:   Left Eye Near:    Bilateral Near:     Physical Exam Constitutional:      Appearance: Normal appearance.  Pulmonary:     Effort: Pulmonary effort is normal.     Breath sounds: Normal breath sounds.  Abdominal:     Tenderness: There is no right CVA tenderness or left CVA tenderness.  Musculoskeletal:     Thoracic back: Tenderness present. No bony tenderness. Decreased range of motion.       Back:  Neurological:     Mental Status: She is alert.      UC Treatments / Results  Labs (all labs ordered are listed, but only abnormal results are displayed) Labs Reviewed  POCT URINALYSIS DIP (MANUAL ENTRY) - Abnormal; Notable for the following components:      Result Value   Color, UA straw (*)    Ketones, POC UA trace (5) (*)    Spec Grav, UA >=1.030 (*)    All other components within normal limits     EKG   Radiology No results found.  Procedures Procedures (including critical care time)  Medications Ordered in UC Medications - No data to display  Initial Impression / Assessment and Plan / UC Course  I have reviewed the triage vital signs and the nursing notes.  Pertinent labs & imaging results that were available during my care of the patient were reviewed by me and considered in my medical decision making (see chart for details).    Reassured pt she probably has both arthritis and a pulled muscle in her back and reassured her PT and ONE of the medicine regimens from either ortho or pcp are good to use. Do not take medicines from both prescribers at the same time as they are likely similar medicines. Keep PT appt. No sign of lung infection or problem or kidney/urinary tract problem.   Final Clinical Impressions(s) / UC Diagnoses   Final diagnoses:  Muscle strain     Discharge Instructions      Take the medicine prescribed by the orthopedist per instructions and follow up with the physical therapist as scheduled.   Increase how much water you drink - your  urine does not show an infection but it does show you are not drinking enough water.    ED Prescriptions   None    PDMP not reviewed this encounter.   Cathlyn Parsons, NP 08/03/23 [phone]

## 2023-08-05 NOTE — Telephone Encounter (Signed)
Spoke w patient.  She has a muscle strain in her neck.  She has been prescribed muscle relaxer by ortho UC and then prednisone taper from ER.  She has started the muscle relaxer but wanted to check before she takes the steroid.  The ER doc told her to wait and only use it if needed if the muscle relaxer doesn't work.  Adv that the steroid could definitely make her racing heartbeat worse.  She will only try it if needed and if symptoms develop she is going to stop it.  Continues Toprol XL daily/extra tab daily as needed.   Pt appreciative for the information provided.

## 2023-08-12 DIAGNOSIS — M545 Low back pain, unspecified: Secondary | ICD-10-CM | POA: Diagnosis not present

## 2023-08-21 DIAGNOSIS — M545 Low back pain, unspecified: Secondary | ICD-10-CM | POA: Diagnosis not present

## 2023-09-02 ENCOUNTER — Other Ambulatory Visit: Payer: Self-pay

## 2023-09-02 ENCOUNTER — Ambulatory Visit (INDEPENDENT_AMBULATORY_CARE_PROVIDER_SITE_OTHER): Payer: 59

## 2023-09-02 ENCOUNTER — Ambulatory Visit (INDEPENDENT_AMBULATORY_CARE_PROVIDER_SITE_OTHER): Payer: 59 | Admitting: Student

## 2023-09-02 ENCOUNTER — Encounter: Payer: Self-pay | Admitting: Student

## 2023-09-02 VITALS — BP 131/81 | HR 68 | Temp 97.9°F | Resp 20 | Ht 66.0 in | Wt 260.7 lb

## 2023-09-02 VITALS — BP 131/81 | HR 68 | Temp 97.9°F | Ht 66.0 in | Wt 260.2 lb

## 2023-09-02 DIAGNOSIS — Z6841 Body Mass Index (BMI) 40.0 and over, adult: Secondary | ICD-10-CM

## 2023-09-02 DIAGNOSIS — M5489 Other dorsalgia: Secondary | ICD-10-CM

## 2023-09-02 DIAGNOSIS — I10 Essential (primary) hypertension: Secondary | ICD-10-CM

## 2023-09-02 DIAGNOSIS — Z0000 Encounter for general adult medical examination without abnormal findings: Secondary | ICD-10-CM | POA: Diagnosis not present

## 2023-09-02 DIAGNOSIS — R053 Chronic cough: Secondary | ICD-10-CM | POA: Diagnosis not present

## 2023-09-02 LAB — POCT GLYCOSYLATED HEMOGLOBIN (HGB A1C): Hemoglobin A1C: 5.3 % (ref 4.0–5.6)

## 2023-09-02 LAB — GLUCOSE, CAPILLARY: Glucose-Capillary: 82 mg/dL (ref 70–99)

## 2023-09-02 MED ORDER — LOSARTAN POTASSIUM 50 MG PO TABS
50.0000 mg | ORAL_TABLET | Freq: Every day | ORAL | 11 refills | Status: DC
Start: 2023-09-02 — End: 2024-01-26

## 2023-09-02 MED ORDER — DULOXETINE HCL 30 MG PO CPEP: 30.0000 mg | ORAL_CAPSULE | Freq: Every day | ORAL | 2 refills | Status: DC

## 2023-09-02 NOTE — Assessment & Plan Note (Signed)
Patient following up for back pain.  Recently seen 1 month ago with concerns of back pain.  Was given lidocaine patches, Tylenol, tizanidine.  She states that this helped.  She also states physical therapy has been aggravating the pain.  She states she is becoming frustrated with the pain.  She states nothing is helping.  She also reports having some urinary incontinence.  She reports having night sweats.  She denies any personal history of cancer or any family history of cancer.  On exam, patient does have decreased range of motion on flexion and extension.  No obvious masses noted.  She does have midline spinal tenderness throughout her cervical, thoracic, and lumbar spine.  With concern for incontinence and night sweats, will obtain imaging at this time as it has been over a year with this pain and no imaging has been done.  Will also start Cymbalta.  This is likely osteoarthritis or degenerative disc disease, but also want to rule out any concerns such as malignancy, abscess, or cord compression.  Plan: -Start Cymbalta 30 mg daily -Handout provided for chronic back pain -Obtain MRI lumbar spine

## 2023-09-02 NOTE — Assessment & Plan Note (Signed)
Patient reports chronic cough.  She states this has been going on for a few months now.  She denies any concerns, but she states that it is frustrating.  She does report she smokes and thinks it could be related to this.  Patient is on lisinopril 5 mg daily.  This could be contributing.  Patient also has never had PFTs to evaluate for COPD.  Plan: -Referred for PFTs -Stop lisinopril and start losartan -Encourage patient to stop smoking -Follow-up in 1 month

## 2023-09-02 NOTE — Assessment & Plan Note (Signed)
A1c today was 5.3.  Patient not diabetic.  BMI is 42 today.  Patient request to be referred for weight management.  Plan: -Patient encouraged to make lifestyle modification -Referred to weight management clinic

## 2023-09-02 NOTE — Assessment & Plan Note (Signed)
Patient has a past medical history of hypertension.  Patient did present with elevated blood pressure into the 140s initially.  Did come down to 130s.  Given patient does have concerns for cough, will start patient on losartan.  Will stop lisinopril.  Plan: -Follow-up in 1 month -BMP at next visit  -Stop lisinopril 5 mg daily -Start losartan 50 mg daily

## 2023-09-02 NOTE — Patient Instructions (Signed)

## 2023-09-02 NOTE — Progress Notes (Signed)
CC: Back pain  HPI:  [name] is a 57 y.o. female with a past medical history of hypertension, uterine fibroids, mitral valve insufficiency presents to discuss weight loss and referral.  Please see assessment and plan for full HPI  Medications: Headaches: Topamax 25 mg nightly Palpitations/SVT: Metoprolol succinate 25 mg daily Nonischemic cardiomyopathy/chronic systolic CHF: March 2024 EF 55-60%, lisinopril, Toprol, Lasix Hypertension: Losartan 50 mg daily  Back pain: Cymbalta 30 mg daily  Past Medical History:  Diagnosis Date   Anemia    COVID-19 virus infection 11/2020   Fibroids    Heart palpitations    History of    History of blood transfusion    with knee replacement   History of colon polyps    Hypertension    Hypertension    no current meds for htn   Knee joint injury    Morbid obesity (HCC)    Pneumonia    remote history   Thyroid goiter    bilateral goiter and cyst follow up every 2 years Dr. Lazarus Salines   Tobacco abuse      Current Outpatient Medications:    DULoxetine (CYMBALTA) 30 MG capsule, Take 1 capsule (30 mg total) by mouth daily., Disp: 30 capsule, Rfl: 2   losartan (COZAAR) 50 MG tablet, Take 1 tablet (50 mg total) by mouth daily., Disp: 30 tablet, Rfl: 11   furosemide (LASIX) 40 MG tablet, Take 1 tablet (40 mg total) by mouth daily., Disp: 90 tablet, Rfl: 1   lidocaine (HM LIDOCAINE PATCH) 4 %, Place 1 patch onto the skin daily., Disp: 14 patch, Rfl: 0   methocarbamol (ROBAXIN) 500 MG tablet, TAKE 1 TABLET BY MOUTH THREE TIMES DAILY FOR 10 DAYS AS NEEDED, Disp: , Rfl:    metoprolol succinate (TOPROL-XL) 25 MG 24 hr tablet, TAKE 1 TABLET(25 MG) BY MOUTH DAILY. MAY TAKE 1 EXTRA TABLET AS NEEDED FOR HEART RACING, Disp: 180 tablet, Rfl: 3   topiramate (TOPAMAX) 25 MG tablet, Take 1 tablet (25 mg total) by mouth at bedtime., Disp: 30 tablet, Rfl: 3  Review of Systems:    MSK: Patient endorses back pain   Physical Exam:  Vitals:    09/02/23 0929 09/02/23 0959  BP: (!) 140/81 131/81  Pulse: 67 68  Resp: 20   Temp: 97.9 F (36.6 C)   TempSrc: Oral   SpO2: 98%   Weight: 260 lb 11.2 oz (118.3 kg)   Height: 5\' 6"  (1.676 m)     General: Patient is sitting comfortably in the room  Head: Normocephalic, atraumatic  Cardio: Regular rate and rhythm, no murmurs, rubs or gallops Pulmonary: Clear to ausculation bilaterally with no rales, rhonchi, and crackles  Back: Midline spinal tenderness noted throughout cervical, thoracic, lumbar region.  Right paraspinal tenderness appreciated.  No step-off, deformities appreciated.  Negative straight leg raise bilaterally.  Decreased range of motion on flexion and extension of lumbar spine   Assessment & Plan:   Chronic cough Patient reports chronic cough.  She states this has been going on for a few months now.  She denies any concerns, but she states that it is frustrating.  She does report she smokes and thinks it could be related to this.  Patient is on lisinopril 5 mg daily.  This could be contributing.  Patient also has never had PFTs to evaluate for COPD.  Plan: -Referred for PFTs -Stop lisinopril and start losartan -Encourage patient to stop smoking -Follow-up in 1 month  Essential hypertension Patient  has a past medical history of hypertension.  Patient did present with elevated blood pressure into the 140s initially.  Did come down to 130s.  Given patient does have concerns for cough, will start patient on losartan.  Will stop lisinopril.  Plan: -Follow-up in 1 month -BMP at next visit  -Stop lisinopril 5 mg daily -Start losartan 50 mg daily  Back pain Patient following up for back pain.  Recently seen 1 month ago with concerns of back pain.  Was given lidocaine patches, Tylenol, tizanidine.  She states that this helped.  She also states physical therapy has been aggravating the pain.  She states she is becoming frustrated with the pain.  She states nothing is  helping.  She also reports having some urinary incontinence.  She reports having night sweats.  She denies any personal history of cancer or any family history of cancer.  On exam, patient does have decreased range of motion on flexion and extension.  No obvious masses noted.  She does have midline spinal tenderness throughout her cervical, thoracic, and lumbar spine.  With concern for incontinence and night sweats, will obtain imaging at this time as it has been over a year with this pain and no imaging has been done.  Will also start Cymbalta.  This is likely osteoarthritis or degenerative disc disease, but also want to rule out any concerns such as malignancy, abscess, or cord compression.  Plan: -Start Cymbalta 30 mg daily -Handout provided for chronic back pain -Obtain MRI lumbar spine  Class 3 severe obesity due to excess calories with serious comorbidity and body mass index (BMI) of 40.0 to 44.9 in adult (HCC) A1c today was 5.3.  Patient not diabetic.  BMI is 42 today.  Patient request to be referred for weight management.  Plan: -Patient encouraged to make lifestyle modification -Referred to weight management clinic   Patient discussed with Dr. Letta Kocher, DO PGY-2 Internal Medicine Resident  Pager: [phone]

## 2023-09-02 NOTE — Progress Notes (Signed)
Subjective:   [name] is a 57 y.o. female who presents for an Initial Medicare Annual Wellness Visit.  Visit Complete: In person   Review of Systems    DEFERRED TO PCP  Cardiac Risk Factors include: advanced age (>58men, >27 women);obesity (BMI >30kg/m2);smoking/ tobacco exposure;hypertension     Objective:    Today's Vitals   09/02/23 1044  BP: 131/81  Pulse: 68  Temp: 97.9 F (36.6 C)  TempSrc: Oral  SpO2: 100%  Weight: 260 lb 3.2 oz (118 kg)  Height: 5\' 6"  (1.676 m)  PainSc: 5    Body mass index is 42 kg/m.     09/02/2023   10:50 AM 09/02/2023    9:26 AM 07/27/2023    3:52 PM 05/07/2023    2:19 PM 03/05/2023    6:54 AM 02/06/2023    8:44 AM 10/15/2022    1:16 PM  Advanced Directives  Does Patient Have a Medical Advance Directive? No No No No No No No  Would patient like information on creating a medical advance directive? No - Patient declined No - Patient declined No - Patient declined    No - Patient declined    Current Medications (verified) Outpatient Encounter Medications as of 09/02/2023  Medication Sig   DULoxetine (CYMBALTA) 30 MG capsule Take 1 capsule (30 mg total) by mouth daily.   furosemide (LASIX) 40 MG tablet Take 1 tablet (40 mg total) by mouth daily.   lidocaine (HM LIDOCAINE PATCH) 4 % Place 1 patch onto the skin daily.   losartan (COZAAR) 50 MG tablet Take 1 tablet (50 mg total) by mouth daily.   methocarbamol (ROBAXIN) 500 MG tablet TAKE 1 TABLET BY MOUTH THREE TIMES DAILY FOR 10 DAYS AS NEEDED   metoprolol succinate (TOPROL-XL) 25 MG 24 hr tablet TAKE 1 TABLET(25 MG) BY MOUTH DAILY. MAY TAKE 1 EXTRA TABLET AS NEEDED FOR HEART RACING   topiramate (TOPAMAX) 25 MG tablet Take 1 tablet (25 mg total) by mouth at bedtime.   No facility-administered encounter medications on file as of 09/02/2023.    Allergies (verified) Tramadol, Ibuprofen, Metronidazole, and Penicillins   History: Past Medical History:  Diagnosis Date   Anemia     COVID-19 virus infection 11/2020   Fibroids    Heart palpitations    History of    History of blood transfusion    with knee replacement   History of colon polyps    Hypertension    Hypertension    no current meds for htn   Knee joint injury    Morbid obesity (HCC)    Pneumonia    remote history   Thyroid goiter    bilateral goiter and cyst follow up every 2 years Dr. Lazarus Salines   Tobacco abuse    Past Surgical History:  Procedure Laterality Date   ABDOMINAL AORTOGRAM N/A 03/05/2023   Procedure: ABDOMINAL AORTOGRAM;  Surgeon: Corky Crafts, MD;  Location: [facility] INVASIVE CV LAB;  Service: Cardiovascular;  Laterality: N/A;   ANKLE SURGERY Right 2009   broken ankle    arthroscopic surgery to knee Bilateral    BREAST BIOPSY Left 03/24/2019   BUBBLE STUDY  03/05/2023   Procedure: BUBBLE STUDY;  Surgeon: Sande Rives, MD;  Location: [facility] ENDOSCOPY;  Service: Cardiovascular;;   CESAREAN SECTION     COLONOSCOPY     HAND SURGERY Bilateral    JOINT REPLACEMENT Bilateral    knees   REPLACEMENT TOTAL KNEE Right 2012   RIGHT/LEFT HEART  CATH AND CORONARY ANGIOGRAPHY N/A 03/05/2023   Procedure: RIGHT/LEFT HEART CATH AND CORONARY ANGIOGRAPHY;  Surgeon: Corky Crafts, MD;  Location: [facility] INVASIVE CV LAB;  Service: Cardiovascular;  Laterality: N/A;   SHOULDER OPEN ROTATOR CUFF REPAIR Right 10/12/2019   Procedure: Right shoulder rotator cuff repair with graft and anchors;  Surgeon: Ranee Gosselin, MD;  Location: [facility] ORS;  Service: Orthopedics;  Laterality: Right;    TEE WITHOUT CARDIOVERSION N/A 03/05/2023   Procedure: TRANSESOPHAGEAL ECHOCARDIOGRAM (TEE);  Surgeon: Sande Rives, MD;  Location: [facility] ENDOSCOPY;  Service: Cardiovascular;  Laterality: N/A;   TOTAL KNEE ARTHROPLASTY Left 01/03/2014   Procedure: LEFT TOTAL KNEE ARTHROPLASTY;  Surgeon: Jacki Cones, MD;  Location: [facility] ORS;  Service: Orthopedics;  Laterality: Left;   TUBAL LIGATION     Family History   Problem Relation Age of Onset   Hypertension Mother    Dementia Mother    Hypertension Father    Atrial fibrillation Father    Cancer Maternal Grandmother        ? lung   Social History   Socioeconomic History   Marital status: Single    Spouse name: Not on file   Number of children: 2   Years of education: Not on file   Highest education level: Not on file  Occupational History   Occupation: disabled    Employer: DISABLED   Tobacco Use   Smoking status: Every Day    Current packs/day: 0.20    Average packs/day: 0.2 packs/day for 20.0 years (4.0 ttl pk-yrs)    Types: Cigarettes   Smokeless tobacco: Never   Tobacco comments:    1-2  cigs per day/ a pack in 2 weeks 10/18  Vaping Use   Vaping status: Never Used  Substance and Sexual Activity   Alcohol use: Yes    Alcohol/week: 2.0 standard drinks of alcohol    Types: 2 Standard drinks or equivalent per week    Comment: occasionally once per month   Drug use: No   Sexual activity: Yes    Birth control/protection: Surgical  Other Topics Concern   Not on file  Social History Narrative   ** Merged History Encounter ** Applying for disability, previously worked in [employer]   Right handed   Caffeine rarely   Live in two story    Social Determinants of Health   Financial Resource Strain: Low Risk  (09/02/2023)   Overall Financial Resource Strain (CARDIA)    Difficulty of Paying Living Expenses: Not hard at all  Food Insecurity: No Food Insecurity (09/02/2023)   Hunger Vital Sign    Worried About Running Out of Food in the Last Year: Never true    Ran Out of Food in the Last Year: Never true  Transportation Needs: No Transportation Needs (09/02/2023)   PRAPARE - Administrator, Civil Service (Medical): No    Lack of Transportation (Non-Medical): No  Physical Activity: Insufficiently Active (09/02/2023)   Exercise Vital Sign    Days of Exercise per Week: 2 days    Minutes of Exercise per Session: 60 min   Stress: Stress Concern Present (09/02/2023)   [employer] of Occupational Health - Occupational Stress Questionnaire    Feeling of Stress : To some extent  Social Connections: Socially Isolated (09/02/2023)   Social Connection and Isolation Panel [NHANES]    Frequency of Communication with Friends and Family: Never    Frequency of Social Gatherings with Friends and Family: Never  Attends Religious Services: More than 4 times per year    Active Member of Clubs or Organizations: No    Attends Banker Meetings: Never    Marital Status: Never married    Tobacco Counseling Ready to quit: Not Answered Counseling given: Not Answered Tobacco comments: 1-2  cigs per day/ a pack in 2 weeks 10/18   Clinical Intake:  Pre-visit preparation completed: Yes  Pain : 0-10 Pain Score: 5  Pain Type: Acute pain Pain Location: Back Pain Orientation: Lower Pain Descriptors / Indicators: Aching, Throbbing Pain Onset: In the past 7 days     BMI - recorded: 42 Nutritional Status: BMI > 30  Obese Nutritional Risks: None Diabetes: No  How often do you need to have someone help you when you read instructions, pamphlets, or other written materials from your doctor or pharmacy?: 1 - Never What is the last grade level you completed in school?: 12 GRADE  Interpreter Needed?: No  Information entered by :: [facility] Crespin Forstrom   Activities of Daily Living    09/02/2023   10:47 AM 09/02/2023    9:26 AM  In your present state of health, do you have any difficulty performing the following activities:  Hearing? 0 0  Vision? 1 1  Difficulty concentrating or making decisions? 0 0  Walking or climbing stairs? 1 1  Dressing or bathing? 0 0  Doing errands, shopping? 0 0  Preparing Food and eating ? N   Using the Toilet? N   In the past six months, have you accidently leaked urine? N   Do you have problems with loss of bowel control? N   Managing your Medications? N   Managing your  Finances? N   Housekeeping or managing your Housekeeping? N     Patient Care Team: Carmina Miller, DO as PCP - General [place] James Nile Dear, MD as PCP - Cardiology (Cardiology)  Indicate any recent Medical Services you may have received from other than [facility] providers in the past year (date may be approximate).     Assessment:   This is a routine wellness examination for Jamile.  Hearing/Vision screen No results found.   Goals Addressed   None   Depression Screen    09/02/2023    9:27 AM 07/27/2023    3:47 PM 10/15/2022    4:59 PM 07/31/2022    3:47 PM 05/30/2022   11:14 AM 11/26/2021    5:01 PM 11/17/2017    2:15 PM  PHQ 2/9 Scores  PHQ - 2 Score 0 0 0 1 0 0 0  PHQ- 9 Score 3 12 3 3  5      Fall Risk    09/02/2023   10:50 AM 09/02/2023    9:25 AM 07/27/2023    3:46 PM 05/07/2023    2:19 PM 02/06/2023    8:44 AM  Fall Risk   Falls in the past year? 0 1 0 0 0  Number falls in past yr: 0 1  0 0  Injury with Fall? 0 0  0 0  Risk for fall due to : Impaired balance/gait Impaired mobility     Follow up Falls evaluation completed;Falls prevention discussed Falls evaluation completed;Falls prevention discussed Falls evaluation completed Falls evaluation completed Falls evaluation completed    MEDICARE RISK AT HOME: Medicare Risk at Home Any stairs in or around the home?: Yes If so, are there any without handrails?: No Home free of loose throw rugs in walkways, pet beds, electrical cords, etc?:  Yes Adequate lighting in your home to reduce risk of falls?: Yes Life alert?: No Use of a cane, walker or w/c?: No Grab bars in the bathroom?: No Shower chair or bench in shower?: No Elevated toilet seat or a handicapped toilet?: No  TIMED UP AND GO:  Was the test performed? No    Cognitive Function:        09/02/2023   10:50 AM  6CIT Screen  What Year? 0 points  What month? 0 points  What time? 0 points  Count back from 20 0 points  Months in reverse 0 points  Repeat  phrase 0 points  Total Score 0 points    Immunizations Immunization History  Administered Date(s) Administered   PPD Test 09/23/2016    TDAP status: Due, Education has been provided regarding the importance of this vaccine. Advised may receive this vaccine at local pharmacy or Health Dept. Aware to provide a copy of the vaccination record if obtained from local pharmacy or Health Dept. Verbalized acceptance and understanding.  Flu Vaccine status: Declined, Education has been provided regarding the importance of this vaccine but patient still declined. Advised may receive this vaccine at local pharmacy or Health Dept. Aware to provide a copy of the vaccination record if obtained from local pharmacy or Health Dept. Verbalized acceptance and understanding.  Pneumococcal vaccine status: Declined,  Education has been provided regarding the importance of this vaccine but patient still declined. Advised may receive this vaccine at local pharmacy or Health Dept. Aware to provide a copy of the vaccination record if obtained from local pharmacy or Health Dept. Verbalized acceptance and understanding.   Covid-19 vaccine status: Completed vaccines  Qualifies for Shingles Vaccine? No   Zostavax completed No   Shingrix Completed?: No.    Education has been provided regarding the importance of this vaccine. Patient has been advised to call insurance company to determine out of pocket expense if they have not yet received this vaccine. Advised may also receive vaccine at local pharmacy or Health Dept. Verbalized acceptance and understanding.  Screening Tests Health Maintenance  Topic Date Due   COVID-19 Vaccine (1) Never done   DTaP/Tdap/Td (1 - Tdap) Never done   Zoster Vaccines- Shingrix (1 of 2) Never done   Colonoscopy  06/02/2023   INFLUENZA VACCINE  Never done   MAMMOGRAM  10/24/2023   Medicare Annual Wellness (AWV)  09/01/2024   PAP SMEAR-Modifier  07/31/2025   Hepatitis C Screening   Completed   HIV Screening  Completed   HPV VACCINES  Aged Out    Health Maintenance  Health Maintenance Due  Topic Date Due   COVID-19 Vaccine (1) Never done   DTaP/Tdap/Td (1 - Tdap) Never done   Zoster Vaccines- Shingrix (1 of 2) Never done   Colonoscopy  06/02/2023   INFLUENZA VACCINE  Never done    Colorectal cancer screening: Type of screening: Colonoscopy. Completed 06/01/2013. Repeat every 10 years Mammogram status: Completed 10/23/2022. Repeat every  year     Lung Cancer Screening: (Low Dose CT Chest recommended if Age 18-80 years, 20 pack-year currently smoking OR have quit w/in 15years.) does qualify.   Lung Cancer Screening Referral: DEFERRED TO PCP   Additional Screening:  Hepatitis C Screening: does qualify; Completed 11/04/2012  Vision Screening: Recommended annual ophthalmology exams for early detection of glaucoma and other disorders of the eye. Is the patient up to date with their annual eye exam?  Yes  Who is the provider or what is  the name of the office in which the patient attends annual eye exams? [facility]  If pt is not established with a provider, would they like to be referred to a provider to establish care? No .   Dental Screening: Recommended annual dental exams for proper oral hygiene  Diabetic Foot Exam: N/A  Community Resource Referral / Chronic Care Management: CRR required this visit?  No   CCM required this visit?  No     Plan:     I have personally reviewed and noted the following in the patient's chart:   Medical and social history Use of alcohol, tobacco or illicit drugs  Current medications and supplements including opioid prescriptions. Patient is currently taking opioid prescriptions. Information provided to patient regarding non-opioid alternatives. Patient advised to discuss non-opioid treatment plan with their provider. Functional ability and status Nutritional status Physical activity Advanced directives List of other  physicians Hospitalizations, surgeries, and ER visits in previous 12 months Vitals Screenings to include cognitive, depression, and falls Referrals and appointments  In addition, I have reviewed and discussed with patient certain preventive protocols, quality metrics, and best practice recommendations. A written personalized care plan for preventive services as well as general preventive health recommendations were provided to patient.     Derrell Lolling, CMA   09/02/2023   After Visit Summary: (Pick Up) Due to this being a telephonic visit, with patients personalized plan was offered to patient and patient has requested to Pick up at office.  Nurse Notes: IN PERSON [facility]   Ms. Philson , Thank you for taking time to come for your Medicare Wellness Visit. I appreciate your ongoing commitment to your health goals. Please review the following plan we discussed and let me know if I can assist you in the future.   These are the goals we discussed:  Goals   None     This is a list of the screening recommended for you and due dates:  Health Maintenance  Topic Date Due   COVID-19 Vaccine (1) Never done   DTaP/Tdap/Td vaccine (1 - Tdap) Never done   Zoster (Shingles) Vaccine (1 of 2) Never done   Colon Cancer Screening  06/02/2023   Flu Shot  Never done   Mammogram  10/24/2023   Medicare Annual Wellness Visit  09/01/2024   Pap Smear  07/31/2025   Hepatitis C Screening  Completed   HIV Screening  Completed   HPV Vaccine  Aged Out

## 2023-09-02 NOTE — Patient Instructions (Addendum)
[name], Busk you for allowing me to take part in your care today.  Here are your instructions.  1.  Regarding your back pain, I am going to refer you for an MRI.  Please wait for phone call to schedule this.  I am also starting a medication called Cymbalta.  Please take this daily.  This should help with your back pain.  Please continue doing all your other supportive care that you have been doing.  2.  Your A1c today was 5.3.  You are not diabetic.  3.  Please come back in 1 month and we can discuss your results.  Thank you, Dr. Allena Katz  If you have any other questions please contact the internal medicine clinic at [phone] If it is after hours, please call the [place] hospital at [phone] and then ask the person who picks up for the resident on call.  \

## 2023-09-03 NOTE — Progress Notes (Signed)
 Internal Medicine Clinic Attending  Case discussed with the resident physician at the time of the visit.  We reviewed the patient's history, exam, and pertinent patient test results.  I agree with the assessment, diagnosis, and plan of care documented in the resident's note.

## 2023-09-08 ENCOUNTER — Ambulatory Visit (HOSPITAL_COMMUNITY): Admission: RE | Admit: 2023-09-08 | Payer: 59 | Source: Ambulatory Visit

## 2023-09-09 ENCOUNTER — Ambulatory Visit: Payer: 59

## 2023-09-10 DIAGNOSIS — H524 Presbyopia: Secondary | ICD-10-CM | POA: Diagnosis not present

## 2023-09-10 DIAGNOSIS — H2513 Age-related nuclear cataract, bilateral: Secondary | ICD-10-CM | POA: Diagnosis not present

## 2023-09-10 DIAGNOSIS — H52223 Regular astigmatism, bilateral: Secondary | ICD-10-CM | POA: Diagnosis not present

## 2023-09-10 DIAGNOSIS — H35033 Hypertensive retinopathy, bilateral: Secondary | ICD-10-CM | POA: Diagnosis not present

## 2023-09-15 ENCOUNTER — Ambulatory Visit (HOSPITAL_COMMUNITY): Payer: 59

## 2023-09-23 ENCOUNTER — Encounter: Payer: 59 | Admitting: Nurse Practitioner

## 2023-09-24 ENCOUNTER — Ambulatory Visit (HOSPITAL_COMMUNITY)
Admission: RE | Admit: 2023-09-24 | Discharge: 2023-09-24 | Disposition: A | Discharge status: Home / Self Care | Payer: 59 | Source: Ambulatory Visit | Attending: Student in an Organized Health Care Education/Training Program | Admitting: Student in an Organized Health Care Education/Training Program

## 2023-09-24 DIAGNOSIS — M5489 Other dorsalgia: Secondary | ICD-10-CM | POA: Insufficient documentation

## 2023-09-24 DIAGNOSIS — M47817 Spondylosis without myelopathy or radiculopathy, lumbosacral region: Secondary | ICD-10-CM | POA: Diagnosis not present

## 2023-09-24 DIAGNOSIS — M48061 Spinal stenosis, lumbar region without neurogenic claudication: Secondary | ICD-10-CM | POA: Diagnosis not present

## 2023-09-24 DIAGNOSIS — M4316 Spondylolisthesis, lumbar region: Secondary | ICD-10-CM | POA: Diagnosis not present

## 2023-09-24 DIAGNOSIS — M47816 Spondylosis without myelopathy or radiculopathy, lumbar region: Secondary | ICD-10-CM | POA: Diagnosis not present

## 2023-10-06 ENCOUNTER — Telehealth: Payer: Self-pay

## 2023-10-06 NOTE — Telephone Encounter (Signed)
Requesting MRI results, please call pt back.

## 2023-10-06 NOTE — Telephone Encounter (Signed)
MRI ordered by Dr Allena Katz.

## 2023-10-13 ENCOUNTER — Telehealth: Payer: Self-pay | Admitting: *Deleted

## 2023-10-13 NOTE — Telephone Encounter (Signed)
Call from patient requesting results of her MRI.  No results were found in the Chart as of yet.  Patient is requesting a call for her results.

## 2023-10-15 NOTE — Progress Notes (Signed)
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 Called the patient and let her know the results.

## 2023-10-19 ENCOUNTER — Inpatient Hospital Stay (HOSPITAL_COMMUNITY): Admission: RE | Admit: 2023-10-19 | Payer: 59 | Source: Ambulatory Visit

## 2023-10-20 ENCOUNTER — Other Ambulatory Visit: Payer: Self-pay | Admitting: Cardiovascular Disease

## 2023-10-26 ENCOUNTER — Other Ambulatory Visit: Payer: 59

## 2023-10-29 ENCOUNTER — Encounter: Payer: 59 | Admitting: Family Medicine

## 2023-10-30 ENCOUNTER — Ambulatory Visit (HOSPITAL_COMMUNITY)
Admission: RE | Admit: 2023-10-30 | Discharge: 2023-10-30 | Disposition: A | Discharge status: Home / Self Care | Payer: 59 | Source: Ambulatory Visit | Attending: Student in an Organized Health Care Education/Training Program

## 2023-10-30 DIAGNOSIS — R053 Chronic cough: Secondary | ICD-10-CM | POA: Insufficient documentation

## 2023-10-30 LAB — PULMONARY FUNCTION TEST
DL/VA % pred: 98 %
DL/VA: 4.09 ml/min/mmHg/L
DLCO unc % pred: 73 %
DLCO unc: 16.02 ml/min/mmHg
FEF 25-75 Pre: 2.55 L/s
FEF2575-%Pred-Pre: 99 %
FEV1-%Pred-Pre: 75 %
FEV1-Pre: 2.1 L
FEV1FVC-%Pred-Pre: 108 %
FEV6-%Pred-Pre: 70 %
FEV6-Pre: 2.47 L
FEV6FVC-%Pred-Pre: 103 %
FVC-%Pred-Pre: 68 %
FVC-Pre: 2.47 L
Pre FEV1/FVC ratio: 85 %
Pre FEV6/FVC Ratio: 100 %
RV % pred: 84 %
RV: 1.73 L
TLC % pred: 78 %
TLC: 4.2 L

## 2023-11-02 ENCOUNTER — Encounter: Payer: Self-pay | Admitting: Gastroenterology
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
## 2023-11-04 ENCOUNTER — Ambulatory Visit
Admission: RE | Admit: 2023-11-04 | Discharge: 2023-11-04 | Disposition: A | Discharge status: Home / Self Care | Payer: 59 | Source: Ambulatory Visit | Attending: Internal Medicine | Admitting: Internal Medicine

## 2023-11-04 DIAGNOSIS — N644 Mastodynia: Secondary | ICD-10-CM

## 2023-11-04 DIAGNOSIS — N6325 Unspecified lump in the left breast, overlapping quadrants: Secondary | ICD-10-CM | POA: Diagnosis not present

## 2023-11-04 DIAGNOSIS — N6320 Unspecified lump in the left breast, unspecified quadrant: Secondary | ICD-10-CM

## 2023-11-10 ENCOUNTER — Encounter: Payer: Self-pay | Admitting: Obstetrics & Gynecology

## 2023-11-10 ENCOUNTER — Other Ambulatory Visit (HOSPITAL_COMMUNITY)
Admission: RE | Admit: 2023-11-10 | Discharge: 2023-11-10 | Disposition: A | Discharge status: Home / Self Care | Payer: 59 | Source: Ambulatory Visit | Attending: Obstetrics & Gynecology | Admitting: Obstetrics & Gynecology

## 2023-11-10 ENCOUNTER — Ambulatory Visit: Payer: 59 | Admitting: Obstetrics & Gynecology

## 2023-11-10 VITALS — BP 141/83 | HR 94 | Ht 66.0 in | Wt 258.0 lb

## 2023-11-10 DIAGNOSIS — R87810 Cervical high risk human papillomavirus (HPV) DNA test positive: Secondary | ICD-10-CM

## 2023-11-10 DIAGNOSIS — Z1151 Encounter for screening for human papillomavirus (HPV): Secondary | ICD-10-CM | POA: Insufficient documentation

## 2023-11-10 DIAGNOSIS — Z01419 Encounter for gynecological examination (general) (routine) without abnormal findings: Secondary | ICD-10-CM | POA: Insufficient documentation

## 2023-11-10 DIAGNOSIS — R32 Unspecified urinary incontinence: Secondary | ICD-10-CM | POA: Diagnosis not present

## 2023-11-10 DIAGNOSIS — Z113 Encounter for screening for infections with a predominantly sexual mode of transmission: Secondary | ICD-10-CM | POA: Insufficient documentation

## 2023-11-10 NOTE — Progress Notes (Signed)
Pt in office for urinary leakage. Pt states she also feels she has odor, ?BV - also complains of "pulling" like feeling near naval.  Pt had mammo 10/2023.

## 2023-11-10 NOTE — Progress Notes (Signed)
Patient ID: [name], female   DOB: 1966/02/04, 57 y.o.   MRN: [id number]  Chief Complaint  Patient presents with   New Patient (Initial Visit)   Annual Exam    HPI [name] is a 57 y.o. female.  [id number] Patient's last menstrual period was 06/07/2016. Patient is referred for repeat pap test due to pap 07/2022 at [facility] clinic with positive HPV type 16. She also reports urge and SUI HPI  Past Medical History:  Diagnosis Date   Anemia    COVID-19 virus infection 11/2020   Fibroids    Heart palpitations    History of    History of blood transfusion    with knee replacement   History of colon polyps    Hypertension    Hypertension    no current meds for htn   Knee joint injury    Morbid obesity (HCC)    Pneumonia    remote history   Thyroid goiter    bilateral goiter and cyst follow up every 2 years Dr. Lazarus Salines   Tobacco abuse     Past Surgical History:  Procedure Laterality Date   ABDOMINAL AORTOGRAM N/A 03/05/2023   Procedure: ABDOMINAL AORTOGRAM;  Surgeon: Corky Crafts, MD;  Location: [facility] INVASIVE CV LAB;  Service: Cardiovascular;  Laterality: N/A;   ANKLE SURGERY Right 2009   broken ankle    arthroscopic surgery to knee Bilateral    BREAST BIOPSY Left 03/24/2019   BUBBLE STUDY  03/05/2023   Procedure: BUBBLE STUDY;  Surgeon: Sande Rives, MD;  Location: [facility] ENDOSCOPY;  Service: Cardiovascular;;   CESAREAN SECTION     COLONOSCOPY     HAND SURGERY Bilateral    JOINT REPLACEMENT Bilateral    knees   REPLACEMENT TOTAL KNEE Right 2012   RIGHT/LEFT HEART CATH AND CORONARY ANGIOGRAPHY N/A 03/05/2023   Procedure: RIGHT/LEFT HEART CATH AND CORONARY ANGIOGRAPHY;  Surgeon: Corky Crafts, MD;  Location: [facility] INVASIVE CV LAB;  Service: Cardiovascular;  Laterality: N/A;   SHOULDER OPEN ROTATOR CUFF REPAIR Right 10/12/2019   Procedure: Right shoulder rotator cuff repair with graft and anchors;  Surgeon: Ranee Gosselin, MD;  Location: [facility]  ORS;  Service: Orthopedics;  Laterality: Right;    TEE WITHOUT CARDIOVERSION N/A 03/05/2023   Procedure: TRANSESOPHAGEAL ECHOCARDIOGRAM (TEE);  Surgeon: Sande Rives, MD;  Location: [facility] ENDOSCOPY;  Service: Cardiovascular;  Laterality: N/A;   TOTAL KNEE ARTHROPLASTY Left 01/03/2014   Procedure: LEFT TOTAL KNEE ARTHROPLASTY;  Surgeon: Jacki Cones, MD;  Location: [facility] ORS;  Service: Orthopedics;  Laterality: Left;   TUBAL LIGATION      Family History  Problem Relation Age of Onset   Hypertension Mother    Dementia Mother    Hypertension Father    Atrial fibrillation Father    Cancer Maternal Grandmother        ? lung    Social History Social History   Tobacco Use   Smoking status: Every Day    Current packs/day: 0.20    Average packs/day: 0.2 packs/day for 20.0 years (4.0 ttl pk-yrs)    Types: Cigarettes   Smokeless tobacco: Never   Tobacco comments:    1-2  cigs per day/ a pack in 2 weeks 10/18  Vaping Use   Vaping status: Never Used  Substance Use Topics   Alcohol use: Yes    Alcohol/week: 2.0 standard drinks of alcohol    Types: 2 Standard drinks or equivalent per week  Comment: occasionally once per month   Drug use: No    Allergies  Allergen Reactions   Tramadol Itching   Ibuprofen Nausea And Vomiting   Metronidazole Itching and Nausea And Vomiting   Penicillins Itching    Denies hives, or airway issues.     Current Outpatient Medications  Medication Sig Dispense Refill   furosemide (LASIX) 40 MG tablet Take 1 tablet (40 mg total) by mouth daily. 90 tablet 1   lisinopril (ZESTRIL) 5 MG tablet TAKE 1 TABLET(5 MG) BY MOUTH DAILY 90 tablet 2   losartan (COZAAR) 50 MG tablet Take 1 tablet (50 mg total) by mouth daily. 30 tablet 11   methocarbamol (ROBAXIN) 500 MG tablet TAKE 1 TABLET BY MOUTH THREE TIMES DAILY FOR 10 DAYS AS NEEDED     metoprolol succinate (TOPROL-XL) 25 MG 24 hr tablet TAKE 1 TABLET(25 MG) BY MOUTH DAILY. MAY TAKE 1 EXTRA TABLET  AS NEEDED FOR HEART RACING 180 tablet 3   topiramate (TOPAMAX) 25 MG tablet Take 1 tablet (25 mg total) by mouth at bedtime. 30 tablet 3   DULoxetine (CYMBALTA) 30 MG capsule Take 1 capsule (30 mg total) by mouth daily. 30 capsule 2   lidocaine (HM LIDOCAINE PATCH) 4 % Place 1 patch onto the skin daily. (Patient not taking: Reported on 11/10/2023) 14 patch 0   No current facility-administered medications for this visit.    Review of Systems Review of Systems  Constitutional: Negative.   Respiratory: Negative.    Genitourinary:  Positive for pelvic pain (some pulling in umbilicus) and vaginal discharge. Negative for menstrual problem.    Blood pressure (!) 141/83, pulse 94, height 5\' 6"  (1.676 m), weight 258 lb (117 kg), last menstrual period 06/07/2016.  Physical Exam Physical Exam Vitals and nursing note reviewed. Exam conducted with a chaperone present.  Constitutional:      Appearance: She is obese.  HENT:     Head: Normocephalic and atraumatic.  Cardiovascular:     Rate and Rhythm: Normal rate.  Pulmonary:     Effort: Pulmonary effort is normal.  Abdominal:     Palpations: Abdomen is soft.  Genitourinary:    General: Normal vulva.     Exam position: Lithotomy position.     Vagina: Vaginal discharge present.     Cervix: Normal.     Uterus: Normal.   Neurological:     General: No focal deficit present.     Mental Status: She is alert.  Psychiatric:        Mood and Affect: Mood normal.        Behavior: Behavior normal.     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 - Plan: Cytology - PAP( [place]), Cervicovaginal ancillary only( [place]), RPR, HIV antibody (with reflex), Hepatitis C Antibody, Hepatitis B Surface AntiGEN  Urinary incontinence, unspecified type - Plan: Ambulatory referral to Urogynecology   Plan Orders Placed This Encounter  Procedures   RPR   HIV antibody (with reflex)    Hepatitis C Antibody   Hepatitis B Surface AntiGEN   Ambulatory referral to Urogynecology    Referral Priority:   Routine    Referral Type:   Consultation    Referral Reason:   Specialty Services Required    Requested Specialty:   Urology    Number of Visits Requested:   1    If pap abnormal schedule colposcopy, o/w repeat in 12 months   Scheryl Darter  11/10/2023, 4:06 PM

## 2023-11-11 ENCOUNTER — Telehealth: Payer: 59 | Admitting: Neurology

## 2023-11-11 ENCOUNTER — Encounter: Payer: Self-pay | Admitting: Neurology

## 2023-11-11 VITALS — Ht 66.0 in | Wt 258.0 lb

## 2023-11-11 DIAGNOSIS — R42 Dizziness and giddiness: Secondary | ICD-10-CM | POA: Diagnosis not present

## 2023-11-11 DIAGNOSIS — E538 Deficiency of other specified B group vitamins: Secondary | ICD-10-CM | POA: Diagnosis not present

## 2023-11-11 DIAGNOSIS — M542 Cervicalgia: Secondary | ICD-10-CM

## 2023-11-11 DIAGNOSIS — I340 Nonrheumatic mitral (valve) insufficiency: Secondary | ICD-10-CM

## 2023-11-11 DIAGNOSIS — I428 Other cardiomyopathies: Secondary | ICD-10-CM

## 2023-11-11 DIAGNOSIS — R2681 Unsteadiness on feet: Secondary | ICD-10-CM

## 2023-11-11 DIAGNOSIS — R519 Headache, unspecified: Secondary | ICD-10-CM

## 2023-11-11 LAB — HEPATITIS B SURFACE ANTIGEN: Hepatitis B Surface Ag: NEGATIVE

## 2023-11-11 LAB — CERVICOVAGINAL ANCILLARY ONLY
Bacterial Vaginitis (gardnerella): NEGATIVE
Candida Glabrata: NEGATIVE
Candida Vaginitis: NEGATIVE
Chlamydia: NEGATIVE
Comment: NEGATIVE
Comment: NEGATIVE
Comment: NEGATIVE
Comment: NEGATIVE
Comment: NEGATIVE
Comment: NORMAL
Neisseria Gonorrhea: NEGATIVE
Trichomonas: NEGATIVE

## 2023-11-11 LAB — RPR: RPR Ser Ql: NONREACTIVE

## 2023-11-11 LAB — HIV ANTIBODY (ROUTINE TESTING W REFLEX): HIV Screen 4th Generation wRfx: NONREACTIVE

## 2023-11-11 LAB — HEPATITIS C ANTIBODY: Hep C Virus Ab: NONREACTIVE

## 2023-11-11 MED ORDER — TOPIRAMATE 25 MG PO TABS: 50.0000 mg | ORAL_TABLET | Freq: Every day | ORAL | 5 refills | Status: DC

## 2023-11-12 ENCOUNTER — Telehealth: Payer: Self-pay

## 2023-11-12 NOTE — Telephone Encounter (Signed)
Patient called she is requesting a call back regarding her results from her pft. Please return patients call.

## 2023-11-13 ENCOUNTER — Encounter: Payer: Self-pay | Admitting: Nurse Practitioner

## 2023-11-13 LAB — CYTOLOGY - PAP
Comment: NEGATIVE
Diagnosis: NEGATIVE
High risk HPV: NEGATIVE

## 2023-11-16 DIAGNOSIS — M25571 Pain in right ankle and joints of right foot: Secondary | ICD-10-CM | POA: Diagnosis not present

## 2023-11-16 DIAGNOSIS — M79671 Pain in right foot: Secondary | ICD-10-CM | POA: Diagnosis not present

## 2023-11-16 DIAGNOSIS — T8484XA Pain due to internal orthopedic prosthetic devices, implants and grafts, initial encounter: Secondary | ICD-10-CM | POA: Diagnosis not present

## 2023-12-28 ENCOUNTER — Ambulatory Visit (INDEPENDENT_AMBULATORY_CARE_PROVIDER_SITE_OTHER): Payer: 59 | Admitting: Physician Assistant

## 2024-01-26 ENCOUNTER — Encounter: Payer: Self-pay | Admitting: Obstetrics and Gynecology

## 2024-01-26 ENCOUNTER — Ambulatory Visit (INDEPENDENT_AMBULATORY_CARE_PROVIDER_SITE_OTHER): Payer: 59 | Admitting: Obstetrics and Gynecology

## 2024-01-26 VITALS — BP 153/102 | HR 75 | Ht 66.0 in | Wt 261.8 lb

## 2024-01-26 DIAGNOSIS — N393 Stress incontinence (female) (male): Secondary | ICD-10-CM | POA: Diagnosis not present

## 2024-01-26 DIAGNOSIS — M6289 Other specified disorders of muscle: Secondary | ICD-10-CM

## 2024-01-26 DIAGNOSIS — N3941 Urge incontinence: Secondary | ICD-10-CM

## 2024-01-26 DIAGNOSIS — R351 Nocturia: Secondary | ICD-10-CM

## 2024-01-26 DIAGNOSIS — R350 Frequency of micturition: Secondary | ICD-10-CM

## 2024-01-26 LAB — POCT URINALYSIS DIPSTICK
Bilirubin, UA: NEGATIVE
Blood, UA: NEGATIVE
Glucose, UA: NEGATIVE
Ketones, UA: NEGATIVE
Leukocytes, UA: NEGATIVE
Nitrite, UA: NEGATIVE
Protein, UA: NEGATIVE
Spec Grav, UA: 1.02 (ref 1.010–1.025)
Urobilinogen, UA: 1 U/dL
pH, UA: 8.5 — AB (ref 5.0–8.0)

## 2024-01-26 MED ORDER — VIBEGRON 75 MG PO TABS: 75.0000 mg | ORAL_TABLET | Freq: Every day | ORAL | 5 refills | Status: DC

## 2024-01-26 NOTE — Patient Instructions (Addendum)
 For the leaking with cough and sneeze we discussed doing the surgical sling or the filler (Stress incontinence)  For the leaking with running to the bathroom and can't hold it (Urge incontinence) we can start with medication and then consider other options if medication is not helpful.   Referral sent for pelvic floor PT  Today we talked about ways to manage bladder urgency such as altering your diet to avoid irritative beverages and foods (bladder diet) as well as attempting to decrease stress and other exacerbating factors.  There is a website with helpful information for people with bladder irritation, called the IC Network at https://www.ic-network.com. This website has more information about a healthy bladder diet and patient forums for support.  The Most Bothersome Foods* The Least Bothersome Foods*  Coffee - Regular & Decaf Tea - caffeinated Carbonated beverages - cola, non-colas, diet & caffeine-free Alcohols - Beer, Red Wine, White Wine, [address] - Grapefruit, [place], Orange, [employer] - Cranberry, Grapefruit, Orange, Pineapple Vegetables - Tomato & Tomato Products Flavor Enhancers - Hot peppers, Spicy foods, Chili, Horseradish, Vinegar, Monosodium glutamate (MSG) Artificial Sweeteners - NutraSweet, Sweet 'N Low, Equal (sweetener), Saccharin Ethnic foods - Mexican, Thai, Indian food Water  Milk - low-fat & whole Fruits - Bananas, Blueberries, Honeydew melon, Pears, Raisins, Watermelon Vegetables - Broccoli, [address] Sprouts, [place], [place], Cauliflower, [place], Cucumber, Mushrooms, Peas, Radishes, Squash, Zucchini, White potatoes, Sweet potatoes & yams Poultry - Chicken, Eggs, Turkey, [employer] - Beef, Dipl[employer] Services Operational Officer, Lamb Seafood - Shrimp, [place] fish, Salmon Grains - Oat, Rice Snacks - Pretzels, Popcorn  *Mitch ALF et al. Diet and its role in interstitial cystitis/bladder pain syndrome (IC/BPS) and comorbid conditions. [employer]. BJU Int. 2012 Jan 11.
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
## 2024-02-02 ENCOUNTER — Ambulatory Visit (INDEPENDENT_AMBULATORY_CARE_PROVIDER_SITE_OTHER): Payer: 59 | Admitting: Nurse Practitioner

## 2024-02-02 ENCOUNTER — Encounter: Payer: Self-pay | Admitting: Nurse Practitioner

## 2024-02-02 VITALS — BP 136/80 | HR 95 | Ht 66.0 in | Wt 260.0 lb

## 2024-02-02 DIAGNOSIS — K5900 Constipation, unspecified: Secondary | ICD-10-CM

## 2024-02-02 DIAGNOSIS — K644 Residual hemorrhoidal skin tags: Secondary | ICD-10-CM | POA: Diagnosis not present

## 2024-02-02 DIAGNOSIS — D649 Anemia, unspecified: Secondary | ICD-10-CM | POA: Diagnosis not present

## 2024-02-02 DIAGNOSIS — K625 Hemorrhage of anus and rectum: Secondary | ICD-10-CM | POA: Diagnosis not present

## 2024-02-02 DIAGNOSIS — Z860100 Personal history of colon polyps, unspecified: Secondary | ICD-10-CM

## 2024-02-02 DIAGNOSIS — K6289 Other specified diseases of anus and rectum: Secondary | ICD-10-CM

## 2024-02-02 DIAGNOSIS — R079 Chest pain, unspecified: Secondary | ICD-10-CM | POA: Diagnosis not present

## 2024-02-02 DIAGNOSIS — Z860102 Personal history of hyperplastic colon polyps: Secondary | ICD-10-CM

## 2024-02-02 DIAGNOSIS — E538 Deficiency of other specified B group vitamins: Secondary | ICD-10-CM

## 2024-02-02 MED ORDER — HYDROCORT-PRAMOXINE (PERIANAL) 2.5-1 % EX CREA: 1.0000 | TOPICAL_CREAM | Freq: Two times a day (BID) | CUTANEOUS | 0 refills | Status: DC

## 2024-02-02 NOTE — Progress Notes (Signed)
02/02/2024 [name] [id number] Sep 02, 1966   CHIEF COMPLAINT: Schedule a colonoscopy   HISTORY OF PRESENT ILLNESS: [name] is a 58 year old female with a history of hypertension, palpitations, mitral valve regurgitation, nonischemic cardiomyopathy, chronic systolic CHF, B12 deficiency, chronic anemia, diverticulosis and colon polyps. She presents today to schedule a colonoscopy.  She underwent a screening colonoscopy by Dr. Jarold Motto 06/01/2013 which identified a flat hyperplastic polyp removed from the sigmoid colon and mild diverticulosis in the sigmoid and descending colon.  She was advised to repeat a colonoscopy 05/2023. No known family history of colon polyps or colorectal cancer. She has intermittent constipation and sometimes sees a small amount of red blood on the toilet tissue which last occurred 4 to 5 months ago without further recurrence. She describes having anal itchiness, feels like there are bugs crawling around the anal area without significant anal rectal pain. She ate a lot of Oreo cookies and passed a black stool later that day without recurrence. She has a history of palpitations, nonischemic cardiomyopathy, chronic systolic CHF and MR followed by cardiologist Dr. Clifton James.  Her most recent ECHO 01/29/2023 showed global hypokinesis, worse in the posterior basal segment with an EF 40 to 50% and moderate to severe MVR which appeared worse when compared to prior TTE 01/01/2022.  She is due for a follow up ECHO 02/2024.  She endorses having intermittent left chest pain which occurs twice monthly and occurs randomly without exertion and unrelated to eating.  Her last episode of chest pain occurred on Friday, 06/27/2024, she stated her left chest pain radiated to her left arm, she considered going to the emergency room but her pain abated within 5 minutes.  No associated shortness of breath.  She has more frequent chest pain if she skips a dose of her blood pressure  medications.  She has intermittent dizziness for which she is followed by neurology, etiology for her dizziness remains unclear, vestibular neuritis vs cardiac.      Latest Ref Rng & Units 03/05/2023    9:14 AM 03/05/2023    9:08 AM 02/04/2023    3:33 PM  CBC  WBC 3.4 - 10.8 x10E3/uL   5.2   Hemoglobin 12.0 - 15.0 g/dL 24.4 - 01.0 g/dL 27.2    53.6  64.4  03.4   Hematocrit 36.0 - 46.0 % 36.0 - 46.0 % 34.0    34.0  34.0  34.4   Platelets 150 - 450 x10E3/uL   178         Latest Ref Rng & Units 03/05/2023    9:14 AM 03/05/2023    9:08 AM 02/04/2023    3:33 PM  CMP  Glucose 70 - 99 mg/dL   87   BUN 6 - 24 mg/dL   21   Creatinine 7.42 - 1.00 mg/dL   5.95   Sodium 638 - 756 mmol/L 135 - 145 mmol/L 140    140  139  144   Potassium 3.5 - 5.1 mmol/L 3.5 - 5.1 mmol/L 4.0    4.0  4.0  4.4   Chloride 96 - 106 mmol/L   108   CO2 20 - 29 mmol/L   24   Calcium 8.7 - 10.2 mg/dL   9.2        Latest Ref Rng & Units 07/24/2022   12:53 AM 10/10/2019    2:07 PM 01/22/2019    7:32 PM  Hepatic Function  Total Protein 6.5 - 8.1 g/dL  7.3  7.5  6.6   Albumin 3.5 - 5.0 g/dL 3.9  4.1  3.4   AST 15 - 41 U/L 22  18  19    ALT 0 - 44 U/L 14  13  14    Alk Phosphatase 38 - 126 U/L 75  72  72   Total Bilirubin 0.3 - 1.2 mg/dL 0.7  0.5  0.5    Echo 4/0/9811:  1. Global hypokinesis worse in the posterior basal segment EF similar to  TTE done 01/01/22 . Left ventricular ejection fraction, by estimation, is  45 to 50%. The left ventricle has mildly decreased function. The left  ventricle demonstrates global  hypokinesis. The left ventricular internal cavity size was moderately  dilated. There is mild left ventricular hypertrophy. Left ventricular  diastolic parameters were normal.   2. Right ventricular systolic function is normal. The right ventricular  size is normal. There is normal pulmonary artery systolic pressure.   3. Left atrial size was moderately dilated.   4. With splay artifact likely  funcitonal from reduced EF and atrial  enlargement MR appears worse than TTE done 01/01/22 consider further w/u  including right / left cath and TEE if clinically indicated . The mitral  valve is abnormal. Moderate to severe  mitral valve regurgitation. No evidence of mitral stenosis.   5. The aortic valve is tricuspid. Aortic valve regurgitation is not  visualized. No aortic stenosis is present.   6. The inferior vena cava is normal in size with greater than 50%  respiratory variability, suggesting right atrial pressure of 3 mmHg.   Brain MRI with and without contrast 05/07/2023: 1. No acute intracranial abnormality. 2. Partial empty, expanded sella and mild narrowing of the transverse sinuses at the transverse sigmoid junction, findings which can be seen in the setting of idiopathic intracranial hypertension.  PAST GI PROCEDURES:   Colonoscopy 06/01/2013:  Small flat polyp was found in the sigmoid colon, biopsy was performed using cold forceps, probable hyperplastic nodule Mild diverticulosis was noted in the sigmoid colon and descending colon The colon was otherwise normal - HYPERPLASTIC POLYP. NO ADENOMATOUS CHANGE OR MALIGNANCY   Past Medical History:  Diagnosis Date   Anemia    COVID-19 virus infection 11/2020   Fibroids    Heart palpitations    History of    History of blood transfusion    with knee replacement   History of colon polyps    Hypertension    Hypertension    no current meds for htn   Knee joint injury    Morbid obesity (HCC)    Pneumonia    remote history   Thyroid goiter    bilateral goiter and cyst follow up every 2 years Dr. Lazarus Salines   Tobacco abuse    Past Surgical History:  Procedure Laterality Date   ABDOMINAL AORTOGRAM N/A 03/05/2023   Procedure: ABDOMINAL AORTOGRAM;  Surgeon: Corky Crafts, MD;  Location: [facility] INVASIVE CV LAB;  Service: Cardiovascular;  Laterality: N/A;   ANKLE SURGERY Right 2009   broken ankle    arthroscopic surgery  to knee Bilateral    BREAST BIOPSY Left 03/24/2019   BUBBLE STUDY  03/05/2023   Procedure: BUBBLE STUDY;  Surgeon: Sande Rives, MD;  Location: [facility] ENDOSCOPY;  Service: Cardiovascular;;   CESAREAN SECTION     COLONOSCOPY     HAND SURGERY Bilateral    JOINT REPLACEMENT Bilateral    knees   REPLACEMENT TOTAL KNEE Right 2012   RIGHT/LEFT HEART  CATH AND CORONARY ANGIOGRAPHY N/A 03/05/2023   Procedure: RIGHT/LEFT HEART CATH AND CORONARY ANGIOGRAPHY;  Surgeon: Corky Crafts, MD;  Location: [facility] INVASIVE CV LAB;  Service: Cardiovascular;  Laterality: N/A;   SHOULDER OPEN ROTATOR CUFF REPAIR Right 10/12/2019   Procedure: Right shoulder rotator cuff repair with graft and anchors;  Surgeon: Ranee Gosselin, MD;  Location: [facility] ORS;  Service: Orthopedics;  Laterality: Right;    TEE WITHOUT CARDIOVERSION N/A 03/05/2023   Procedure: TRANSESOPHAGEAL ECHOCARDIOGRAM (TEE);  Surgeon: Sande Rives, MD;  Location: [facility] ENDOSCOPY;  Service: Cardiovascular;  Laterality: N/A;   TOTAL KNEE ARTHROPLASTY Left 01/03/2014   Procedure: LEFT TOTAL KNEE ARTHROPLASTY;  Surgeon: Jacki Cones, MD;  Location: [facility];  Service: Orthopedics;  Laterality: Left;   TUBAL LIGATION     Social History: She smokes cigarettes < 1 cigarette daily, 1 pack lasts 1 1/2 weeks. Rare alcohol intake. No drug use.   Family History: Father with atrial fibrillation. Mother with hypertension and dementia. Maternal grandmother possibly had lung cancer.  No known family history of esophageal, gastric or colorectal cancer.  Allergies  Allergen Reactions   Tramadol Itching   Ibuprofen Nausea And Vomiting   Metronidazole Itching and Nausea And Vomiting   Penicillins Itching    Denies hives, or airway issues.       Outpatient Encounter Medications as of 02/02/2024  Medication Sig   DULoxetine (CYMBALTA) 30 MG capsule Take 1 capsule (30 mg total) by mouth daily.   furosemide (LASIX) 40 MG tablet Take 1 tablet (40 mg  total) by mouth daily.   lisinopril (ZESTRIL) 5 MG tablet TAKE 1 TABLET(5 MG) BY MOUTH DAILY   metoprolol succinate (TOPROL-XL) 25 MG 24 hr tablet TAKE 1 TABLET(25 MG) BY MOUTH DAILY. MAY TAKE 1 EXTRA TABLET AS NEEDED FOR HEART RACING   topiramate (TOPAMAX) 25 MG tablet Take 2 tablets (50 mg total) by mouth at bedtime.   Vibegron 75 MG TABS Take 1 tablet (75 mg total) by mouth daily.   methocarbamol (ROBAXIN) 500 MG tablet  (Patient not taking: Reported on 02/02/2024)   No facility-administered encounter medications on file as of 02/02/2024.   REVIEW OF SYSTEMS:  Gen: Denies fever, sweats or chills. No weight loss.  CV: See HPI. Resp: Denies cough, shortness of breath of hemoptysis.  GI: See HPI.  GU: + Overactive bladder. MS: Denies joint pain, muscles aches or weakness. Derm: Denies rash, itchiness, skin lesions or unhealing ulcers. Psych: Denies depression, anxiety, memory loss or confusion. Heme: Denies bruising, easy bleeding. Neuro: + Dizziness.  Endo:  Denies any problems with DM, thyroid or adrenal function.  PHYSICAL EXAM: BP 136/80   Pulse 95   Ht 5\' 6"  (1.676 m)   Wt 260 lb (117.9 kg)   LMP 06/07/2016   BMI 41.97 kg/m  General: Obese 58 year old female in no acute distress. Head: Normocephalic and atraumatic. Eyes:  Sclerae non-icteric, conjunctive pink. Ears: Normal auditory acuity. Mouth: Dentition intact. No ulcers or lesions.  Neck: Supple, no lymphadenopathy or thyromegaly.  Lungs: Clear bilaterally to auscultation without wheezes, crackles or rhonchi. Heart: Regular rate and rhythm. Positive murmur. No rub or gallop appreciated.  Abdomen: Soft, nontender, nondistended. No masses. No hepatosplenomegaly. Normoactive bowel sounds x 4 quadrants.  Rectal: Non-inflamed nonbleeding external hemorrhoids, posterior anal hypertrophied papilla without an obvious anal fissure. Internal hemorrhoids palpated without prolapse. Shell CMA present during exam.  Musculoskeletal:  Symmetrical with no gross deformities. Skin: Warm and dry. No rash or lesions on  visible extremities. Extremities: No edema. Neurological: Alert oriented x 4, no focal deficits.  Psychological:  Alert and cooperative. Normal mood and affect.  ASSESSMENT AND PLAN:  58 year old female with a history of a flat hyperplastic polyp removed from the sigmoid colon per colonoscopy 01/2013 presents to schedule a colonoscopy.  No known family history of colon polyps or colorectal cancer. -Colonoscopy at [facility], benefits and risks discussed including risk with sedation, risk of bleeding, perforation and infection  -Colonoscopy to be scheduled after cardiac clearance received  Infrequent rectal bleeding with intermittent anal irritation, likely secondary to external hemorrhoids and hypertrophied anal papilla. -Analpram 2.5% apply a small amount inside into the external anal area twice daily for 5 to 7 days as needed, patient informed not to use for extended period of time. -Colonoscopy as ordered above -Use unscented Dove soap with bathing  Constipation -Benefiber daily MiraLAX nightly as needed  Chest pain, occurs twice monthly, her most recent episode of chest pain which radiated to the left arm occurred on 01/29/2024 and lasted for less than 5 minutes. -Patient was instructed to go to the emergency room if chest pain recurs -Follow-up with Dr. Clifton James, cardiac clearance as ordered above   Nonischemic cardiomyopathy, chronic systolic CHF with moderate to severe MVR -Patient due for follow-up ECHO 02/2024  Dizziness, unclear etiology (vestibular neuritis vs cardiac). Brain MRI 04/2023 without acute intracranial abnormality, possible idiopathic intracranial hypertension. -Continue follow-up with neurology  Chronic normocytic anemia. B12 deficiency.  -CBC, IBC + Ferritin, B12 and folate level     CC:  Carmina Miller, DO

## 2024-02-02 NOTE — Patient Instructions (Addendum)
Cardiac clearance is required prior to scheduling a colonoscopy.  Recommend unscented Dove soap for bathing.  Go to the ED if chest pain recurs.  Due to recent changes in healthcare laws, you may see the results of your imaging and laboratory studies on MyChart before your provider has had a chance to review them.  We understand that in some cases there may be results that are confusing or concerning to you. Not all laboratory results come back in the same time frame and the provider may be waiting for multiple results in order to interpret others.  Please give [place] 48 hours in order for your provider to thoroughly review all the results before contacting the office for clarification of your results.   Thank you for trusting me with your gastrointestinal care!   Alcide Evener, CRNP

## 2024-02-03 NOTE — Progress Notes (Signed)
____________________________________________________________  Attending physician addendum:  Thank you for sending this case to me. I have reviewed the entire note and agree with the plan.  Please follow-up on the March 2025 echocardiogram report as well as office note and plan from her cardiology visit with Dr. Clifton James on 04/11/2024, as this may change our plans and timing for a routine colonoscopy on this patient.  Amada Jupiter, MD  ____________________________________________________________

## 2024-02-04 ENCOUNTER — Telehealth: Payer: Self-pay | Admitting: Nurse Practitioner

## 2024-02-04 ENCOUNTER — Other Ambulatory Visit: Payer: Self-pay

## 2024-02-04 MED ORDER — HYDROCORTISONE (PERIANAL) 2.5 % EX CREA: 1.0000 | TOPICAL_CREAM | Freq: Two times a day (BID) | CUTANEOUS | 0 refills | Status: DC

## 2024-02-04 NOTE — Telephone Encounter (Signed)
Patient called and stated that the cream she was prescribed was not covered through her insurance. Patient is wanting a generic brand for the Analpram 2.5%. Patient also stated that she has a stomach bug and was wondering if she can come on Monday for her blood work. Patient is requesting a call back today if possible. Please advise.

## 2024-02-04 NOTE — Telephone Encounter (Signed)
[name], ok to send in RX for Hydrocortisone 2.5% as generic apply a small amount inside and to the external anal area once daily x 5 to 7 days. Patient can return to our lab tomorrow or Monday 02/08/2024 for labs. THX.

## 2024-02-04 NOTE — Telephone Encounter (Signed)
Updated prescription sent to pharmacy & patient made aware. She will come by Monday for labs.

## 2024-02-08 ENCOUNTER — Other Ambulatory Visit (INDEPENDENT_AMBULATORY_CARE_PROVIDER_SITE_OTHER): Payer: 59

## 2024-02-08 DIAGNOSIS — D649 Anemia, unspecified: Secondary | ICD-10-CM | POA: Diagnosis not present

## 2024-02-08 LAB — CBC WITH DIFFERENTIAL/PLATELET
Basophils Absolute: 0 10*3/uL (ref 0.0–0.1)
Basophils Relative: 1 % (ref 0.0–3.0)
Eosinophils Absolute: 0.3 10*3/uL (ref 0.0–0.7)
Eosinophils Relative: 7.6 % — ABNORMAL HIGH (ref 0.0–5.0)
HCT: 38.2 % (ref 36.0–46.0)
Hemoglobin: 12.9 g/dL (ref 12.0–15.0)
Lymphocytes Relative: 25.4 % (ref 12.0–46.0)
Lymphs Abs: 1.1 10*3/uL (ref 0.7–4.0)
MCHC: 33.7 g/dL (ref 30.0–36.0)
MCV: 96.8 fL (ref 78.0–100.0)
Monocytes Absolute: 0.4 10*3/uL (ref 0.1–1.0)
Monocytes Relative: 9 % (ref 3.0–12.0)
Neutro Abs: 2.4 10*3/uL (ref 1.4–7.7)
Neutrophils Relative %: 57 % (ref 43.0–77.0)
Platelets: 180 10*3/uL (ref 150.0–400.0)
RBC: 3.94 Mil/uL (ref 3.87–5.11)
RDW: 16.7 % — ABNORMAL HIGH (ref 11.5–15.5)
WBC: 4.3 10*3/uL (ref 4.0–10.5)

## 2024-02-08 LAB — IBC + FERRITIN
Ferritin: 12 ng/mL (ref 10.0–291.0)
Iron: 70 ug/dL (ref 42–145)
Saturation Ratios: 20.6 % (ref 20.0–50.0)
TIBC: 340.2 ug/dL (ref 250.0–450.0)
Transferrin: 243 mg/dL (ref 212.0–360.0)

## 2024-02-08 LAB — COMPREHENSIVE METABOLIC PANEL
ALT: 9 U/L (ref 0–35)
AST: 14 U/L (ref 0–37)
Albumin: 4.1 g/dL (ref 3.5–5.2)
Alkaline Phosphatase: 92 U/L (ref 39–117)
BUN: 18 mg/dL (ref 6–23)
CO2: 27 meq/L (ref 19–32)
Calcium: 9.1 mg/dL (ref 8.4–10.5)
Chloride: 105 meq/L (ref 96–112)
Creatinine, Ser: 1.06 mg/dL (ref 0.40–1.20)
GFR: 57.81 mL/min — ABNORMAL LOW (ref >60.00)
Glucose, Bld: 91 mg/dL (ref 70–99)
Potassium: 4.3 meq/L (ref 3.5–5.1)
Sodium: 138 meq/L (ref 135–145)
Total Bilirubin: 0.5 mg/dL (ref 0.2–1.2)
Total Protein: 7.1 g/dL (ref 6.0–8.3)

## 2024-02-23 DIAGNOSIS — L301 Dyshidrosis [pompholyx]: Secondary | ICD-10-CM | POA: Diagnosis not present

## 2024-02-26 DIAGNOSIS — H40033 Anatomical narrow angle, bilateral: Secondary | ICD-10-CM | POA: Diagnosis not present

## 2024-02-26 DIAGNOSIS — H5203 Hypermetropia, bilateral: Secondary | ICD-10-CM | POA: Diagnosis not present

## 2024-03-09 ENCOUNTER — Ambulatory Visit (HOSPITAL_COMMUNITY)
Admission: EM | Admit: 2024-03-09 | Discharge: 2024-03-09 | Disposition: A | Discharge status: Home / Self Care | Attending: Emergency Medicine | Admitting: Emergency Medicine

## 2024-03-09 ENCOUNTER — Encounter (HOSPITAL_COMMUNITY): Payer: Self-pay | Admitting: *Deleted

## 2024-03-09 DIAGNOSIS — J988 Other specified respiratory disorders: Secondary | ICD-10-CM

## 2024-03-09 DIAGNOSIS — B9789 Other viral agents as the cause of diseases classified elsewhere: Secondary | ICD-10-CM

## 2024-03-09 LAB — POC COVID19/FLU A&B COMBO
Covid Antigen, POC: NEGATIVE
Influenza A Antigen, POC: NEGATIVE
Influenza B Antigen, POC: NEGATIVE

## 2024-03-09 MED ORDER — LIDOCAINE VISCOUS HCL 2 % MT SOLN: 15.0000 mL | OROMUCOSAL | 0 refills | Status: DC | PRN

## 2024-03-09 NOTE — ED Provider Notes (Signed)
 [facility]    CSN: [id number] Arrival date & time: 03/09/24  0818      History   Chief Complaint Chief Complaint  Patient presents with   Sore Throat    HPI [name] is a 58 y.o. female.   Patient presents with sore throat, congestion, dry cough, and headache that began on 3/17.  Patient states that she has trouble swallowing due to pain.   Denies any known sick exposures.  Denies fever, body aches, shortness of breath, chest pain, nausea, vomiting, diarrhea, and abdominal pain.  Denies taking any medications for her symptoms.   Sore Throat    Past Medical History:  Diagnosis Date   Anemia    COVID-19 virus infection 11/2020   Fibroids    Heart palpitations    History of    History of blood transfusion    with knee replacement   History of colon polyps    Hypertension    Hypertension    no current meds for htn   Knee joint injury    Morbid obesity (HCC)    Pneumonia    remote history   Thyroid goiter    bilateral goiter and cyst follow up every 2 years Dr. Lazarus Salines   Tobacco abuse     Patient Active Problem List   Diagnosis Date Noted   Chronic cough 09/02/2023   Class 3 severe obesity due to excess calories with serious comorbidity and body mass index (BMI) of 40.0 to 44.9 in adult (HCC) 09/02/2023   Back pain 07/27/2023   Mitral valve insufficiency 03/05/2023   Breast pain, left 10/15/2022   Recurrent sinusitis 11/28/2021   Routine screening for STI (sexually transmitted infection) 11/28/2021   Tobacco abuse    COVID-19 virus infection 11/2020   Hot flashes 09/23/2016   Healthcare maintenance 09/23/2016   S/P total knee arthroplasty, 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   Uterine fibroid 05/09/2013   Tobacco use disorder 04/12/2013   OA (osteoarthritis) 10/28/2011   Toxic multinodular goiter 09/17/2009   Essential hypertension 09/17/2009   ANEMIA, HX OF 09/17/2009    Past Surgical History:  Procedure Laterality Date   ABDOMINAL  AORTOGRAM N/A 03/05/2023   Procedure: ABDOMINAL AORTOGRAM;  Surgeon: Corky Crafts, MD;  Location: [facility] INVASIVE CV LAB;  Service: Cardiovascular;  Laterality: N/A;   ANKLE SURGERY Right 2009   broken ankle    arthroscopic surgery to knee Bilateral    BREAST BIOPSY Left 03/24/2019   BUBBLE STUDY  03/05/2023   Procedure: BUBBLE STUDY;  Surgeon: Sande Rives, MD;  Location: [facility] ENDOSCOPY;  Service: Cardiovascular;;   CESAREAN SECTION     COLONOSCOPY     HAND SURGERY Bilateral    JOINT REPLACEMENT Bilateral    knees   REPLACEMENT TOTAL KNEE Right 2012   RIGHT/LEFT HEART CATH AND CORONARY ANGIOGRAPHY N/A 03/05/2023   Procedure: RIGHT/LEFT HEART CATH AND CORONARY ANGIOGRAPHY;  Surgeon: Corky Crafts, MD;  Location: [facility] INVASIVE CV LAB;  Service: Cardiovascular;  Laterality: N/A;   SHOULDER OPEN ROTATOR CUFF REPAIR Right 10/12/2019   Procedure: Right shoulder rotator cuff repair with graft and anchors;  Surgeon: Ranee Gosselin, MD;  Location: [facility];  Service: Orthopedics;  Laterality: Right;    TEE WITHOUT CARDIOVERSION N/A 03/05/2023   Procedure: TRANSESOPHAGEAL ECHOCARDIOGRAM (TEE);  Surgeon: Sande Rives, MD;  Location: [facility] ENDOSCOPY;  Service: Cardiovascular;  Laterality: N/A;   TOTAL KNEE ARTHROPLASTY Left 01/03/2014   Procedure: LEFT TOTAL KNEE ARTHROPLASTY;  Surgeon: Georges Lynch  Gioffre, MD;  Location: [facility];  Service: Orthopedics;  Laterality: Left;   TUBAL LIGATION      OB History     Gravida  4   Para  2   Term  2   Preterm  0   AB  2   Living  2      SAB  1   IAB  1   Ectopic  0   Multiple      Live Births  2            Home Medications    Prior to Admission medications   Medication Sig Start Date End Date Taking? Authorizing Provider  furosemide (LASIX) 40 MG tablet Take 1 tablet (40 mg total) by mouth daily. 02/05/23  Yes Kathleene Hazel, MD  lidocaine (XYLOCAINE) 2 % solution Use as directed 15 mLs in the mouth  or throat as needed for mouth pain. 03/09/24  Yes Susann Givens, Dae Highley A, NP  lisinopril (ZESTRIL) 5 MG tablet TAKE 1 TABLET(5 MG) BY MOUTH DAILY 10/20/23  Yes Kathleene Hazel, MD  metoprolol succinate (TOPROL-XL) 25 MG 24 hr tablet TAKE 1 TABLET(25 MG) BY MOUTH DAILY. MAY TAKE 1 EXTRA TABLET AS NEEDED FOR HEART RACING 03/31/23  Yes Kathleene Hazel, MD  topiramate (TOPAMAX) 25 MG tablet Take 2 tablets (50 mg total) by mouth at bedtime. 11/11/23  Yes HillManus Gunning, MD  Vibegron 75 MG TABS Take 1 tablet (75 mg total) by mouth daily. 01/26/24  Yes Selmer Dominion, NP  hydrocortisone (ANUSOL-HC) 2.5 % rectal cream Place 1 Application rectally 2 (two) times daily. Apply a small amount inside and to the external anal area once daily x 5 to 7 days 02/04/24   Arnaldo Natal, NP  methocarbamol (ROBAXIN) 500 MG tablet     [provider]    Family History Family History  Problem Relation Age of Onset   Hypertension Mother    Dementia Mother    Hypertension Father    Atrial fibrillation Father    Cancer Maternal Grandmother        ? lung   Colon cancer Neg Hx    Stomach cancer Neg Hx    Esophageal cancer Neg Hx     Social History Social History   Tobacco Use   Smoking status: Every Day    Current packs/day: 0.20    Average packs/day: 0.2 packs/day for 20.0 years (4.0 ttl pk-yrs)    Types: Cigarettes   Smokeless tobacco: Never   Tobacco comments:    1-2  cigs per day/ a pack in 2 weeks 10/18- 23cigs a day-11/11/23  Vaping Use   Vaping status: Never Used  Substance Use Topics   Alcohol use: Yes    Alcohol/week: 2.0 standard drinks of alcohol    Types: 2 Standard drinks or equivalent per week    Comment: occasionally once per month   Drug use: No     Allergies   Tramadol, Ibuprofen, Metronidazole, and Penicillins   Review of Systems Review of Systems  Per HPI  Physical Exam Triage Vital Signs ED Triage Vitals  Encounter Vitals Group     BP  03/09/24 0835 (!) 133/90     Systolic BP Percentile --      Diastolic BP Percentile --      Pulse Rate 03/09/24 0835 84     Resp 03/09/24 0835 20     Temp 03/09/24 0835 98.1 F (36.7 C)  Temp src --      SpO2 03/09/24 0835 96 %     Weight --      Height --      Head Circumference --      Peak Flow --      Pain Score 03/09/24 0832 5     Pain Loc --      Pain Education --      Exclude from Growth Chart --    No data found.  Updated Vital Signs BP (!) 133/90   Pulse 84   Temp 98.1 F (36.7 C)   Resp 20   LMP 06/07/2016   SpO2 96%   Visual Acuity Right Eye Distance:   Left Eye Distance:   Bilateral Distance:    Right Eye Near:   Left Eye Near:    Bilateral Near:     Physical Exam Vitals and nursing note reviewed.  Constitutional:      General: She is awake. She is not in acute distress.    Appearance: Normal appearance. She is well-developed and well-groomed. She is not ill-appearing.  HENT:     Right Ear: Tympanic membrane, ear canal and external ear normal.     Left Ear: Tympanic membrane, ear canal and external ear normal.     Nose: Congestion and rhinorrhea present.     Mouth/Throat:     Mouth: Mucous membranes are moist.     Pharynx: Posterior oropharyngeal erythema present. No oropharyngeal exudate.  Cardiovascular:     Rate and Rhythm: Normal rate and regular rhythm.  Pulmonary:     Effort: Pulmonary effort is normal.     Breath sounds: Normal breath sounds.  Skin:    General: Skin is warm and dry.  Neurological:     Mental Status: She is alert.  Psychiatric:        Behavior: Behavior is cooperative.      UC Treatments / Results  Labs (all labs ordered are listed, but only abnormal results are displayed) Labs Reviewed  POC COVID19/FLU A&B COMBO    EKG   Radiology No results found.  Procedures Procedures (including critical care time)  Medications Ordered in UC Medications - No data to display  Initial Impression / Assessment  and Plan / UC Course  I have reviewed the triage vital signs and the nursing notes.  Pertinent labs & imaging results that were available during my care of the patient were reviewed by me and considered in my medical decision making (see chart for details).     Upon assessment congestion and rhinorrhea are present, mild erythema noted to pharynx.  Lungs clear bilaterally to auscultation.  COVID and flu testing are negative.  Prescribed lidocaine as needed for sore throat.  Discussed over-the-counter medication for viral illness related symptoms.  Discussed return precautions. Final Clinical Impressions(s) / UC Diagnoses   Final diagnoses:  Viral respiratory illness     Discharge Instructions      You tested negative for COVID and flu today. I believe your symptoms are from a viral respiratory illness. Use lidocaine as needed for sore throat.  Gargle and spit this, do not swallow. You can take Tylenol as needed for headache and sore throat.   Due to your history of high blood pressure I recommend taking a medication called Coricidin for cough and congestion.  Avoid medications that include phenylephrine or pseudoephedrine as these can increase your blood pressure.  Stay hydrated and get plenty of rest.  Return here if symptoms  persist or worsen.      ED Prescriptions     Medication Sig Dispense Auth. Provider   lidocaine (XYLOCAINE) 2 % solution Use as directed 15 mLs in the mouth or throat as needed for mouth pain. 100 mL Wynonia Lawman A, NP      PDMP not reviewed this encounter.   Wynonia Lawman A, NP 03/09/24 [phone]

## 2024-03-09 NOTE — ED Triage Notes (Signed)
 Pt reports sine Monday she has had a sore ,scratchy throat and congestion. Pt also has a dry cough.

## 2024-03-09 NOTE — Discharge Instructions (Addendum)
 You tested negative for COVID and flu today. I believe your symptoms are from a viral respiratory illness. Use lidocaine as needed for sore throat.  Gargle and spit this, do not swallow. You can take Tylenol as needed for headache and sore throat.   Due to your history of high blood pressure I recommend taking a medication called Coricidin for cough and congestion.  Avoid medications that include phenylephrine or pseudoephedrine as these can increase your blood pressure.  Stay hydrated and get plenty of rest.  Return here if symptoms persist or worsen.

## 2024-03-10 ENCOUNTER — Other Ambulatory Visit: Payer: Self-pay

## 2024-03-10 MED ORDER — FUROSEMIDE 40 MG PO TABS: 40.0000 mg | ORAL_TABLET | Freq: Every day | ORAL | 0 refills | Status: AC

## 2024-03-11 ENCOUNTER — Telehealth: Payer: Self-pay

## 2024-03-11 ENCOUNTER — Ambulatory Visit (HOSPITAL_COMMUNITY)
Admission: EM | Admit: 2024-03-11 | Discharge: 2024-03-11 | Discharge status: Against Medical Advice | Attending: Family Medicine | Admitting: Family Medicine

## 2024-03-11 ENCOUNTER — Encounter (HOSPITAL_COMMUNITY): Payer: Self-pay | Admitting: Emergency Medicine

## 2024-03-11 ENCOUNTER — Telehealth: Payer: Self-pay | Admitting: *Deleted

## 2024-03-11 ENCOUNTER — Ambulatory Visit: Admitting: Internal Medicine

## 2024-03-11 VITALS — BP 172/102 | HR 81 | Temp 98.1°F | Ht 66.0 in | Wt 265.2 lb

## 2024-03-11 DIAGNOSIS — I10 Essential (primary) hypertension: Secondary | ICD-10-CM

## 2024-03-11 DIAGNOSIS — J069 Acute upper respiratory infection, unspecified: Secondary | ICD-10-CM | POA: Diagnosis not present

## 2024-03-11 MED ORDER — BENZONATATE 200 MG PO CAPS: 200.0000 mg | ORAL_CAPSULE | Freq: Three times a day (TID) | ORAL | 0 refills | Status: DC | PRN

## 2024-03-11 MED ORDER — FLUTICASONE PROPIONATE 50 MCG/ACT NA SUSP: 1.0000 | Freq: Every day | NASAL | 0 refills | Status: DC

## 2024-03-11 MED ORDER — ALBUTEROL SULFATE HFA 108 (90 BASE) MCG/ACT IN AERS: 2.0000 | INHALATION_SPRAY | Freq: Four times a day (QID) | RESPIRATORY_TRACT | 0 refills | Status: DC | PRN

## 2024-03-11 MED ORDER — SULFAMETHOXAZOLE-TRIMETHOPRIM 800-160 MG PO TABS: ORAL_TABLET | ORAL | 0 refills | Status: DC

## 2024-03-11 NOTE — Patient Instructions (Signed)
 I hope you start to feel better soon.  Take tessalon pearles for cough.  Try to use a sinus rinse and the follow it with flonase.   You can use albuterol inhaler if you need to for shortness of breath.

## 2024-03-11 NOTE — ED Triage Notes (Signed)
 Pt reports came in Wed and was given Lidocaine for her sore throat. Pt reports nose congested, coughing and "can't breath".

## 2024-03-11 NOTE — Assessment & Plan Note (Signed)
 Blood pressure is elevated today at 172/102 was slightly elevated at urgent care 2 days ago.  Given her acute illness I will not change her medications today she has an upcoming appointment with cardiology where blood pressure should be reassessed.
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
## 2024-03-11 NOTE — Telephone Encounter (Addendum)
 [name] is called to provide pre-procedural instructions for her [] Cystoscopy, [] Bladder Botox or [x] Urethral Bulking. She is scheduled on 03/17/2024.  The patient is not having worsening urinary symptoms to her urinary patterns.  [] The patient is scheduled to come in 2-3 days prior for a non-provider visit to run a POC Urinalysis.  [] The patient cannot come in for a pre-procedural Urinalysis therefore discussed with provider to determine treatment prior to procedure.   [x] The patient has been prescribed the pre-procedural antibiotics. The patient is reminded to take the prescribed antibiotics the morning of the procedure, an hour before and the morning of the following day.   [] The patient has not previously been prescribed the pre-procedural anitbiotics, review with patient her medication allergies to determine the antibiotic prescription needed.  [x] Since no allergies, a prescription for Bactrim DS 500mg  #2, take 1 the morning of the procedure and 1 the morning after the procedure and one the morning after the procedure.  [] Since the patient is allergic to Sulfa, the patient will be prescribed Macrobid 100mg  #2, take 1 the morning of the procedure and 1 the morning after the procedure.  The patient is reminded to arrive 5 minutes prior to the scheduled appointment time and that a urine sample will need to be collected the upon arrival for the procedure.

## 2024-03-11 NOTE — ED Notes (Signed)
 Patient came out of room stating that she was leaving because she can't breathe and no one is doing a f-ing thing about it. States that she is "going down there" (pointing towards the hospital). Staff made patient aware that wait at ED will more than likely be longer than her wait here. Pt kept walking out door towards lobby.

## 2024-03-11 NOTE — Progress Notes (Signed)
 Established Patient Office Visit  Subjective   Patient ID: [name], female    DOB: [birth year]  Age: 58 y.o. MRN: [id number]  Chief Complaint  Patient presents with   Cough    No productive cough since Wednesday  / sob   [name] presents today for evaluation of an acute illness.  She reports that since Wednesday she has had a generally nonproductive cough nasal congestion-clear and sore throat.  Occasionally gets a little short of breath during coughing episodes think she may be wheezing.  She is a smoker but reports smoking fairly minimally only 1 to 2 cigarettes a day.  She has already been to urgent care for this complaint testing ruled out flu and COVID.  She was prescribed lidocaine which she reports has not upped her cough she returned to urgent care today but had to wait several hours and then came over to our office.  She denies fever or chills.     Objective:     BP (!) 172/102 (BP Location: Left Arm, Patient Position: Sitting, Cuff Size: Large)   Pulse 81   Temp 98.1 F (36.7 C) (Oral)   Ht 5\' 6"  (1.676 m)   Wt 265 lb 3.2 oz (120.3 kg)   LMP 06/07/2016   SpO2 99%   BMI 42.80 kg/m  BP Readings from Last 3 Encounters:  03/11/24 (!) 172/102  03/11/24 (!) 172/99  03/09/24 (!) 133/90   Wt Readings from Last 3 Encounters:  03/11/24 265 lb 3.2 oz (120.3 kg)  02/02/24 260 lb (117.9 kg)  01/26/24 261 lb 12.8 oz (118.8 kg)      Physical Exam Constitutional:      Appearance: Normal appearance.  HENT:     Nose: Rhinorrhea present.     Comments: Inflamed inferior turbinates    Mouth/Throat:     Pharynx: Posterior oropharyngeal erythema present.  Pulmonary:     Effort: Pulmonary effort is normal.     Breath sounds: Normal breath sounds. No wheezing.  Musculoskeletal:     Cervical back: Neck supple.  Lymphadenopathy:     Cervical: Cervical adenopathy present.  Neurological:     Mental Status: She is alert.  Psychiatric:        Mood and Affect: Mood  normal.        Behavior: Behavior normal.      No results found for any visits on 03/11/24.    The ASCVD Risk score (Arnett DK, et al., 2019) failed to calculate for the following reasons:   Cannot find a previous HDL lab   Cannot find a previous total cholesterol lab    Assessment & Plan:   Problem List Items Addressed This Visit       Cardiovascular and Mediastinum   Essential hypertension   Blood pressure is elevated today at 172/102 was slightly elevated at urgent care 2 days ago.  Given her acute illness I will not change her medications today she has an upcoming appointment with cardiology where blood pressure should be reassessed.        Respiratory   Upper respiratory infection with cough and congestion - Primary   Will prescribe Tessalon Perles 200 mg for cough.  Instructed how to do a sinus rinse and instructed to use Flonase.  Will also give a albuterol inhaler reviewed her PFTs from last year does not have obstructive disease but I think this may help her in this acute illness.  Instructed on proper technique for inhaler use.  Return in about 2 months (around 05/11/2024), or if symptoms worsen or fail to improve.    Gust Rung, DO

## 2024-03-11 NOTE — ED Notes (Addendum)
 Patient came out of the room to ask where she was in line. Patient informed that there is one patient in front of her before the provider will come in and she is in with that patient now.   Around 10 minutes later patient left out the room with her things. Patient states, "I am going down to the ER because no one here wants to F**king help me!! I can not breath!" Patient's oxygen at 98% on room air respirations 18. Patient informed that she is next but she states she does not want to wait.   Patient LWBS after triage.

## 2024-03-11 NOTE — Assessment & Plan Note (Signed)
 Will prescribe Tessalon Perles 200 mg for cough.  Instructed how to do a sinus rinse and instructed to use Flonase.  Will also give a albuterol inhaler reviewed her PFTs from last year does not have obstructive disease but I think this may help her in this acute illness.  Instructed on proper technique for inhaler use.

## 2024-03-11 NOTE — Telephone Encounter (Signed)
 Patient walked in to Clinics today c/o shortness of breath and cough.  Went to UC on 3/19 was seen .  States was given Lidocaine for her throat and nothing else.  Patient states not feeling any better and still has the cough. Unable to cough up anything.  C/O shortness of breath today.  No fever for a few days.  Went back to UC but was not seen.  Spoke with Dr. Mikey Bussing who will see patient this morning.

## 2024-03-17 ENCOUNTER — Ambulatory Visit: Payer: 59 | Admitting: Obstetrics

## 2024-03-17 NOTE — Progress Notes (Deleted)
 Bulkamid Injection  CC: 58 y.o. y.o. F with stress incontinence who presents for transurethral Bulkamid injection.  Patient signed her consent form.  She started antibiotic prophylaxis today.  There were no vitals filed for this visit.  No results found for this or any previous visit (from the past 24 hours).  Procedure: Time out was performed. The bladder was catheterized and 10 ml of 2% lidocaine jelly placed in the urethra. A urethral block was performed by injecting 3ml of 1% lidocaine with epinephrine at 3 and 9 o'clock adjacent to the urethra.  The needle was primed.  The cystoscope was inserted to the level of the bladder neck.  The needle was inserted 2 cm and the scope was pulled back into the urethra 2 cm.  The needle was inserted bevel up at the 5 o'clock position and the Bulkamid was injected to obtain coaptation.  This was repeated at the 2 o'clock,  10 o'clock and 7 o'clock positions.   A total of 2- 1ml syringes were used and good circumferential coaptation was noted.  The patient tolerated the procedure well. She was asked to void after the procedure.  Post-Void Residual (PVR) by Bladder Scan: In order to evaluate bladder emptying, we discussed obtaining a postvoid residual and she agreed to this procedure.  Procedure: The ultrasound unit was placed on the patient's abdomen in the suprapubic region after the patient had voided. A PVR of *** ml was obtained by bladder scan.     ASSESSMENT: 58 y.o. y.o. s/p transurethral Bulkamid injection for stress incontinence.   PLAN: Patient will follow up in 4 weeks to reassess. Voiding and post-procedure precautions were given. She will return for heavy bleeding, fevers, dysuria lasting beyond today and incomplete emptying.  All questions were answered.  Loleta Chance, MD

## 2024-03-18 ENCOUNTER — Ambulatory Visit: Payer: 59 | Admitting: Neurology

## 2024-03-23 ENCOUNTER — Emergency Department (HOSPITAL_COMMUNITY)

## 2024-03-23 ENCOUNTER — Other Ambulatory Visit: Payer: Self-pay

## 2024-03-23 ENCOUNTER — Emergency Department (HOSPITAL_COMMUNITY)
Admission: EM | Admit: 2024-03-23 | Discharge: 2024-03-23 | Disposition: A | Discharge status: Home / Self Care | Attending: Emergency Medicine | Admitting: Emergency Medicine

## 2024-03-23 ENCOUNTER — Ambulatory Visit: Payer: Self-pay | Admitting: *Deleted

## 2024-03-23 ENCOUNTER — Encounter (HOSPITAL_COMMUNITY): Payer: Self-pay

## 2024-03-23 DIAGNOSIS — H02402 Unspecified ptosis of left eyelid: Secondary | ICD-10-CM | POA: Insufficient documentation

## 2024-03-23 DIAGNOSIS — I10 Essential (primary) hypertension: Secondary | ICD-10-CM | POA: Insufficient documentation

## 2024-03-23 DIAGNOSIS — R519 Headache, unspecified: Secondary | ICD-10-CM | POA: Diagnosis present

## 2024-03-23 DIAGNOSIS — Z79899 Other long term (current) drug therapy: Secondary | ICD-10-CM | POA: Insufficient documentation

## 2024-03-23 DIAGNOSIS — M5020 Other cervical disc displacement, unspecified cervical region: Secondary | ICD-10-CM | POA: Diagnosis not present

## 2024-03-23 DIAGNOSIS — S199XXA Unspecified injury of neck, initial encounter: Secondary | ICD-10-CM | POA: Diagnosis not present

## 2024-03-23 DIAGNOSIS — S0990XA Unspecified injury of head, initial encounter: Secondary | ICD-10-CM

## 2024-03-23 DIAGNOSIS — S0083XA Contusion of other part of head, initial encounter: Secondary | ICD-10-CM | POA: Diagnosis not present

## 2024-03-23 DIAGNOSIS — M542 Cervicalgia: Secondary | ICD-10-CM | POA: Diagnosis not present

## 2024-03-23 DIAGNOSIS — M19012 Primary osteoarthritis, left shoulder: Secondary | ICD-10-CM | POA: Diagnosis not present

## 2024-03-23 DIAGNOSIS — Z720 Tobacco use: Secondary | ICD-10-CM | POA: Diagnosis not present

## 2024-03-23 DIAGNOSIS — M47812 Spondylosis without myelopathy or radiculopathy, cervical region: Secondary | ICD-10-CM | POA: Diagnosis not present

## 2024-03-23 DIAGNOSIS — M25512 Pain in left shoulder: Secondary | ICD-10-CM | POA: Diagnosis not present

## 2024-03-23 MED ORDER — MORPHINE SULFATE (PF) 4 MG/ML IV SOLN
4.0000 mg | Freq: Once | INTRAVENOUS | Status: AC
  Administered 2024-03-23: 4 mg via INTRAMUSCULAR
  Filled 2024-03-23: qty 1

## 2024-03-23 MED ORDER — ACETAMINOPHEN 325 MG PO TABS
650.0000 mg | ORAL_TABLET | Freq: Once | ORAL | Status: AC
  Administered 2024-03-23: 650 mg via ORAL
  Filled 2024-03-23: qty 2

## 2024-03-23 MED ORDER — LISINOPRIL 10 MG PO TABS
10.0000 mg | ORAL_TABLET | Freq: Once | ORAL | Status: AC
  Administered 2024-03-23: 10 mg via ORAL
  Filled 2024-03-23: qty 1
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
## 2024-03-23 NOTE — Telephone Encounter (Signed)
Per chart, pt is currently in the ED.   
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
## 2024-03-23 NOTE — Discharge Instructions (Addendum)
 As discussed, your imaging does not show signs of fractures.  Take Percocet every 6 hours as needed for pain.  This medication can cause drowsiness do not drive operate heavy machinery while take this medication.  Ice your eye for improvement of swelling.  Follow-up with your primary care in the next 3 to 5 days for reevaluation of your symptoms and your blood pressure as it was elevated today. Keep your appointment with your eye doctor for this week as well.  Get help right away if: You have sudden: Headache that is very bad. Vomiting that does not stop. Changes in the size of one of your pupils. Pupils are the black centers of your eyes. Changes in how you see (vision). More confusion or more grumpy moods. You have a seizure. Your symptoms get worse. You have a clear or bloody fluid coming from your nose or ears.

## 2024-03-23 NOTE — ED Notes (Signed)
 Patient transported to X-ray
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
## 2024-03-25 DIAGNOSIS — H209 Unspecified iridocyclitis: Secondary | ICD-10-CM | POA: Diagnosis not present

## 2024-03-29 DIAGNOSIS — Z87828 Personal history of other (healed) physical injury and trauma: Secondary | ICD-10-CM | POA: Diagnosis not present

## 2024-03-31 ENCOUNTER — Ambulatory Visit: Attending: Cardiovascular Disease | Admitting: Cardiovascular Disease

## 2024-03-31 VITALS — BP 140/88 | HR 79 | Resp 16 | Ht 66.0 in | Wt 265.2 lb

## 2024-03-31 DIAGNOSIS — I10 Essential (primary) hypertension: Secondary | ICD-10-CM | POA: Diagnosis not present

## 2024-03-31 DIAGNOSIS — I340 Nonrheumatic mitral (valve) insufficiency: Secondary | ICD-10-CM | POA: Diagnosis not present

## 2024-03-31 DIAGNOSIS — Z01810 Encounter for preprocedural cardiovascular examination: Secondary | ICD-10-CM | POA: Diagnosis not present

## 2024-03-31 DIAGNOSIS — I428 Other cardiomyopathies: Secondary | ICD-10-CM | POA: Diagnosis not present

## 2024-03-31 DIAGNOSIS — I471 Supraventricular tachycardia, unspecified: Secondary | ICD-10-CM

## 2024-03-31 MED ORDER — LISINOPRIL 10 MG PO TABS: 10.0000 mg | ORAL_TABLET | Freq: Every day | ORAL | 3 refills | Status: DC

## 2024-03-31 NOTE — Patient Instructions (Signed)
 Medication Instructions:  Your physician has recommended you make the following change in your medication:  1.) losartan removed from medication list today 2.) increase lisinopril to 10 mg - one tablet daily  *If you need a refill on your cardiac medications before your next appointment, please call your pharmacy*  Lab Work: none   Testing/Procedures: Your physician has requested that you have an echocardiogram. Echocardiography is a painless test that uses sound waves to create images of your heart. It provides your doctor with information about the size and shape of your heart and how well your heart's chambers and valves are working. This procedure takes approximately one hour. There are no restrictions for this procedure. Please do NOT wear cologne, perfume, aftershave, or lotions (deodorant is allowed). Please arrive 15 minutes prior to your appointment time.  Please note: We ask at that you not bring children with you during ultrasound (echo/ vascular) testing. Due to room size and safety concerns, children are not allowed in the ultrasound rooms during exams. Our front office staff cannot provide observation of children in our lobby area while testing is being conducted. An adult accompanying a patient to their appointment will only be allowed in the ultrasound room at the discretion of the ultrasound technician under special circumstances. We apologize for any inconvenience.   Follow-Up: At [facility], you and your health needs are our priority.  As part of our continuing mission to provide you with exceptional heart care, our providers are all part of one team.  This team includes your primary Cardiologist (physician) and Advanced Practice Providers or APPs (Physician Assistants and Nurse Practitioners) who all work together to provide you with the care you need, when you need it.  Your next appointment:   12 month(s)  Provider:   Verne Carrow, MD    We  recommend signing up for the patient portal called "MyChart".  Sign up information is provided on this After Visit Summary.  MyChart is used to connect with patients for Virtual Visits (Telemedicine).  Patients are able to view lab/test results, encounter notes, upcoming appointments, etc.  Non-urgent messages can be sent to your provider as well.   To learn more about what you can do with MyChart, go to [email or web address].      1st Floor: - Lobby - Registration  - Pharmacy  - Lab - Cafe  2nd Floor: - PV Lab - Diagnostic Testing (echo, CT, nuclear med)  3rd Floor: - Vacant  4th Floor: - TCTS (cardiothoracic surgery) - AFib Clinic - Structural Heart Clinic - Vascular Surgery  - Vascular Ultrasound  5th Floor: - HeartCare Cardiology (general and EP) - Clinical Pharmacy for coumadin, hypertension, lipid, weight-loss medications, and med management appointments    Valet parking services will be available as well.
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
## 2024-04-01 DIAGNOSIS — H43811 Vitreous degeneration, right eye: Secondary | ICD-10-CM | POA: Diagnosis not present

## 2024-04-11 ENCOUNTER — Ambulatory Visit: Payer: 59 | Admitting: Cardiovascular Disease
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
## 2024-04-12 ENCOUNTER — Encounter: Payer: 59 | Admitting: Physical Therapy

## 2024-04-13 ENCOUNTER — Ambulatory Visit: Payer: 59 | Admitting: Obstetrics

## 2024-04-15 ENCOUNTER — Telehealth: Payer: Self-pay | Admitting: Nurse Practitioner

## 2024-04-15 NOTE — Telephone Encounter (Signed)
 Dottie, see staff message I forwarded to you on this patient. Pls schedule patient for a colonoscopy with Dr. Dominic Friendly at  [facility] or [facility] in the next available outpatient block there. THX.

## 2024-04-15 NOTE — Telephone Encounter (Signed)
 Colleen-  Please advise. Looks like we were waiting on cardiology clearance for patient to move forward with a hospital colonoscopy procedure. See 03/31/24 cardiology note (indicates patient may move forward with procedure) and advise how to proceed.

## 2024-04-15 NOTE — Telephone Encounter (Signed)
 Inbound call from patient, states she would like to schedule her colonoscopy but was advised during her last visit that she needed to get cardiology clearance prior to. Patient would like to speak with a nurse to see if clearance was ever received to schedule her procedure.

## 2024-04-18 ENCOUNTER — Other Ambulatory Visit: Payer: Self-pay | Admitting: *Deleted

## 2024-04-18 DIAGNOSIS — Z860100 Personal history of colon polyps, unspecified: Secondary | ICD-10-CM

## 2024-04-18 NOTE — Therapy (Addendum)
 OUTPATIENT PHYSICAL THERAPY FEMALE PELVIC EVALUATION   Patient Name: [name] MRN: [id number] DOB:07-Jun-1966, 58 y.o., female Today's Date: 04/19/2024  END OF SESSION:  PT End of Session - 04/19/24 1307     Visit Number 1    Date for PT Re-Evaluation 07/12/24    Authorization Type [employer] medicare    Authorization - Visit Number 1    Authorization - Number of Visits 10    PT Start Time 1300    PT Stop Time 1345    PT Time Calculation (min) 45 min    Activity Tolerance Patient tolerated treatment well    Behavior During Therapy WFL for tasks assessed/performed             Past Medical History:  Diagnosis Date   Anemia    COVID-19 virus infection 11/2020   Fibroids    Heart palpitations    History of    History of blood transfusion    with knee replacement   History of colon polyps    Hypertension    Hypertension    no current meds for htn   Knee joint injury    Morbid obesity (HCC)    Pneumonia    remote history   Thyroid  goiter    bilateral goiter and cyst follow up every 2 years Dr. Archer Kobs   Tobacco abuse    Past Surgical History:  Procedure Laterality Date   ABDOMINAL AORTOGRAM N/A 03/05/2023   Procedure: ABDOMINAL AORTOGRAM;  Surgeon: Lucendia Rusk, MD;  Location: [facility] INVASIVE CV LAB;  Service: Cardiovascular;  Laterality: N/A;   ANKLE SURGERY Right 2009   broken ankle    arthroscopic surgery to knee Bilateral    BREAST BIOPSY Left 03/24/2019   BUBBLE STUDY  03/05/2023   Procedure: BUBBLE STUDY;  Surgeon: Harrold Lincoln, MD;  Location: [facility] ENDOSCOPY;  Service: Cardiovascular;;   CESAREAN SECTION     COLONOSCOPY     HAND SURGERY Bilateral    JOINT REPLACEMENT Bilateral    knees   REPLACEMENT TOTAL KNEE Right 2012   RIGHT/LEFT HEART CATH AND CORONARY ANGIOGRAPHY N/A 03/05/2023   Procedure: RIGHT/LEFT HEART CATH AND CORONARY ANGIOGRAPHY;  Surgeon: Lucendia Rusk, MD;  Location: [facility] INVASIVE CV LAB;  Service: Cardiovascular;   Laterality: N/A;   SHOULDER OPEN ROTATOR CUFF REPAIR Right 10/12/2019   Procedure: Right shoulder rotator cuff repair with graft and anchors;  Surgeon: Hazle Lites, MD;  Location: [facility] ORS;  Service: Orthopedics;  Laterality: Right;    TEE WITHOUT CARDIOVERSION N/A 03/05/2023   Procedure: TRANSESOPHAGEAL ECHOCARDIOGRAM (TEE);  Surgeon: Harrold Lincoln, MD;  Location: [facility] ENDOSCOPY;  Service: Cardiovascular;  Laterality: N/A;   TOTAL KNEE ARTHROPLASTY Left 01/03/2014   Procedure: LEFT TOTAL KNEE ARTHROPLASTY;  Surgeon: Florencia Hunter, MD;  Location: [facility] ORS;  Service: Orthopedics;  Laterality: Left;   TUBAL LIGATION     Patient Active Problem List   Diagnosis Date Noted   Upper respiratory infection with cough and congestion 03/11/2024   Chronic cough 09/02/2023   Class 3 severe obesity due to excess calories with serious comorbidity and body mass index (BMI) of 40.0 to 44.9 in adult [facility]) 09/02/2023   Back pain 07/27/2023   Mitral valve insufficiency 03/05/2023   Breast pain, left 10/15/2022   Recurrent sinusitis 11/28/2021   Routine screening for STI (sexually transmitted infection) 11/28/2021   Tobacco abuse    COVID-19 virus infection 11/2020   Hot flashes 09/23/2016   Healthcare maintenance 09/23/2016  S/P total knee arthroplasty, 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   Uterine fibroid 05/09/2013   Tobacco use disorder 04/12/2013   OA (osteoarthritis) 10/28/2011   Toxic multinodular goiter 09/17/2009   Essential hypertension 09/17/2009   ANEMIA, HX OF 09/17/2009    PCP: Ronni Colace, DO  REFERRING PROVIDER: Zuleta, Kaitlin G, NP   REFERRING DIAG:  N39.3 (ICD-10-CM) - SUI (stress urinary incontinence, female)  N39.41 (ICD-10-CM) - Urge incontinence  M62.89 (ICD-10-CM) - Pelvic floor dysfunction  R35.1 (ICD-10-CM) - Nocturia    THERAPY DIAG:  Muscle weakness (generalized)  Pelvic floor dysfunction in female  Rationale for Evaluation and Treatment: Rehabilitation  ONSET  DATE: 05/2023  SUBJECTIVE:                                                                                                                                                                                           SUBJECTIVE STATEMENT: Patient reports she was urinating on herself. She would have more leakage when having a beer with friends or laugh. Patient felt she was smelling of urine. She had a tear on her urethra.  Fluid intake:  40oz Water , Soda, 32oz Tea, Lemonade  per day   PAIN:  Are you having pain? Yes NPRS scale: 6/10 Pain location:  posterior left side  Pain type: dull Pain description: constant   Aggravating factors: not sure Relieving factors: drinks water   PRECAUTIONS: None  RED FLAGS: None   WEIGHT BEARING RESTRICTIONS: No  FALLS:  Has patient fallen in last 6 months? No  OCCUPATION: disability  ACTIVITY LEVEL : goes to the [employer]   PLOF: Independent  PATIENT GOALS: reduce leakage  PERTINENT HISTORY:  Fibroids; hypertension; thyroid  goiter; cesarean section Sexual abuse: No  BOWEL MOVEMENT: has a colonoscopy soon Pain with bowel movement: No Type of bowel movement:Type (Bristol Stool Scale) Type 1 and 4, Frequency 1-2 times per day, Strain yes, and Splinting yes Fully empty rectum: Yes:   Leakage: No Pads: No Fiber supplement/laxative No  URINATION: Pain with urination: No Fully empty bladder: Nodribbling after finishing, the need to urinate multiple times in a row, and to push on her belly or vagina to empty bladde  Stream: Weak Urgency: sometimes Frequency: Day time voids 10.  Nocturia: 5 times per night to void  Leakage: Urge to void, Walking to the bathroom, Coughing, Sneezing, Intercourse, and without sensation ; leaks all day Pads: No  INTERCOURSE:not active   PREGNANCY: Vaginal deliveries 1, forcep delivery C-section deliveries 1 Currently pregnant No  PROLAPSE: Pressure   OBJECTIVE:  Note: Objective measures were completed at  Evaluation unless otherwise noted.  DIAGNOSTIC FINDINGS:  CST positive Pelvic floor  strength I/V  Post Void Residual 70 mL    PATIENT SURVEYS:  PFIQ-7: 44 UIQ-7: 52  COGNITION: Overall cognitive status: Within functional limits for tasks assessed     SENSATION: Light touch: Appears intact   POSTURE: rounded shoulders, forward head, decreased lumbar lordosis, and obese   LUMBARAROM/PROM:  A/PROM A/PROM  eval  Flexion Decreased by 25%  Extension Decreased by 50%  Right lateral flexion Decreased by 50%  Left lateral flexion Decreased by 50%  Right rotation Decreased by 50%  Left rotation Decreased by 50%   (Blank rows = not tested)  LOWER EXTREMITY ROM:  Passive ROM Right eval Left eval  Hip flexion 90 90   (Blank rows = not tested)  LOWER EXTREMITY MMT:  MMT Right eval Left eval  Hip abduction 3/5 3/5   (Blank rows = not tested) PALPATION:    Abdominal: not able to contract her abdominals, decreased movement of lower rib cage, tenderness located in lower abdominal pain                External Perineal Exam: intact                             Internal Pelvic Floor: tenderness located on the right side of the levator ani, obturator internist, urethra and bladder  Patient confirms identification and approves PT to assess internal pelvic floor and treatment Yes  PELVIC MMT:   MMT eval  Vaginal 2/5  (Blank rows = not tested)        TONE: Increased on the right side of the pelvic floor  PROLAPSE: none  TODAY'S TREATMENT:                                                                                                                              DATE: 04/19/24  EVAL Examination completed, findings reviewed, pt educated on POC, HEP, and female pelvic floor anatomy, reasoning with pelvic floor assessment internally with pt consent . Pt motivated to participate in PT and agreeable to attempt recommendations.     PATIENT EDUCATION:  Education details:  educated patient what will be done in therapy, reviewed her goals and the results of the eval.  Person educated: Patient Education method: Explanation Education comprehension: verbalized understanding  HOME EXERCISE PROGRAM: See above  ASSESSMENT:  CLINICAL IMPRESSION: Patient is a 58 y.o. female who was seen today for physical therapy evaluation and treatment for stress and urge incontinence, nocturia, and pelvic floor dysfunction. Patient reports left posterior trunk pain at level 6/10 that has been going on for 3 weeks and started to have some urinary burning. She is going to the doctor after this visit to be assessed. Patient reports she leaks urine with   urge to void, walking to the bathroom, coughing, sneezing, intercourse, and without sensation. She  leaks all day. Day time voids are 10 and  nocturia is 5 times  per night to void. Pelvic floor strength is 2/5. She has palpable tenderness on the right urethra, bladder, levator ani, and obturator internist. She has difficulty with contraction her abdominals and opening up her rib cage. Decreased mobility of c-section scar. Patient will benefit from skilled therapy to improve her coordination and strength of the pelvic floor to reduce her urinary leakage.    OBJECTIVE IMPAIRMENTS: decreased coordination, decreased endurance, decreased strength, increased fascial restrictions, increased muscle spasms, and pain.   ACTIVITY LIMITATIONS: continence and locomotion level  PARTICIPATION LIMITATIONS: shopping and community activity  PERSONAL FACTORS: Fitness and 1-2 comorbidities: Fibroids; hypertension; thyroid  goiter; cesarean section  are also affecting patient's functional outcome.   REHAB POTENTIAL: Good  CLINICAL DECISION MAKING: Evolving/moderate complexity  EVALUATION COMPLEXITY: Moderate   GOALS: Goals reviewed with patient? Yes  SHORT TERM GOALS: Target date: 05/17/24  Independent with initial HEP.  Baseline: not educated  yet Goal status: INITIAL  LONG TERM GOALS: Target date: 07/12/24  Patient independent with advanced HEP to reduce her urinary leakage.  Baseline: not educated yet.  Goal status: INITIAL  2.  Patient is able to use the urge to void behavioral technique to walk to the bathroom without leaking urine.  Baseline: not educated yet and will leak as she walks to the bathroom Goal status: INITIAL  3.  Pelvic floor strength increased >/= 3/5 to reduce her urinary leakage while out of her home and not have people smell urine on her.  Baseline: strength is 2/5.  Goal status: INITIAL  4.  Patient is able to cough or sneeze with urinary leakage decreased >/= 75% due to reduction of trigger points in the pelvic floor and increased strength >/= 3/5.  Baseline: strength is 2/5 Goal status: INITIAL  5.  UIQ-7 has improved to </= 25 due to decreased in frustration and the urinary leakage during activities.  Baseline: UIQ-7: 52 Goal status: INITIAL   PLAN:  PT FREQUENCY: 1-2x/week  PT DURATION: 12 weeks  PLANNED INTERVENTIONS: 97110-Therapeutic exercises, 97530- Therapeutic activity, 97112- Neuromuscular re-education, 97535- Self Care, 16109- Manual therapy, [phone]- Aquatic Therapy, Patient/Family education, Dry Needling, Joint mobilization, Spinal mobilization, Scar mobilization, Cryotherapy, Moist heat, and Biofeedback  PLAN FOR NEXT SESSION: manual work to abdomen, diaphragmatic breathing, bladder irritants, see what MD said about back pain   Marsha Skeen, PT 04/19/24 1:57 PM  PHYSICAL THERAPY DISCHARGE SUMMARY  Visits from Start of Care: 1  Current functional level related to goals / functional outcomes: See above. Patient called today to be discharged without a reason.    Remaining deficits: See above.    Education / Equipment: HEP   Patient agrees to discharge. Patient goals were not met. Patient is being discharged due to the patient's request. Thank you for the referral.    Marsha Skeen, PT 04/26/24 11:04 AM

## 2024-04-18 NOTE — Telephone Encounter (Signed)
===  View-only below this line=== ----- Message ----- From: Tory Freiberg, NP Sent: 04/15/2024   5:06 PM EDT To: Glennette Lanius, RN  Dottie, refer to phone note you sent me on this patient. Pls review Dr. Dominic Friendly' message below. Pls schedule her for a colonoscopy with Dr. Dominic Friendly at [facility] or [facility]. THX. ----- Message ----- From: Albertina Hugger, MD Sent: 04/15/2024   4:45 PM EDT To: Tory Freiberg, NP  Thanks for checking, Colleen.  Lets get her scheduled for colonoscopy with me at [facility] or [facility] in the next available outpatient block there.  Memory Staggers MD ----- Message ----- From: Tory Freiberg, NP Sent: 04/11/2024   8:16 AM EDT To: Albertina Hugger, MD  Hi Dr. Dominic Friendly, refer to office visit 02/02/2024. She was seen by her cardiologist Dr. Abel Hoe 03/31/2024, he provided cardiac clearance "Pre-operative cardiovascular examination: She can proceed with her planned colonoscopy", however, he ordered an updated ECHO which is scheduled on 5/16, hx of moderate to severe MR.   Shall we wait until ECHO completed prior to scheduling a colonoscopy or are you ok with moving forward and scheduling a colonoscopy at [facility] or [facility]?   Thanks for your input,  Colleen

## 2024-04-18 NOTE — Telephone Encounter (Signed)
 Patient is advised of recommendation to proceed with colonoscopy and that she has been given clearance by cardiology to move forward as well. Patient has scheduled hospital colonoscopy on 06/16/24 at 1030 am and has scheduled telephone previsit for 05/20/24.

## 2024-04-19 ENCOUNTER — Other Ambulatory Visit: Payer: Self-pay

## 2024-04-19 ENCOUNTER — Encounter: Payer: Self-pay | Admitting: Physical Therapy

## 2024-04-19 ENCOUNTER — Encounter: Payer: 59 | Attending: Obstetrics and Gynecology | Admitting: Physical Therapy

## 2024-04-19 DIAGNOSIS — M6289 Other specified disorders of muscle: Secondary | ICD-10-CM | POA: Insufficient documentation

## 2024-04-19 DIAGNOSIS — M6281 Muscle weakness (generalized): Secondary | ICD-10-CM | POA: Insufficient documentation

## 2024-04-21 ENCOUNTER — Ambulatory Visit: Payer: Self-pay | Admitting: Internal Medicine

## 2024-04-21 ENCOUNTER — Ambulatory Visit: Payer: Self-pay

## 2024-04-21 NOTE — Telephone Encounter (Signed)
 Reason for Triage: left side pain, hurts to lay on it    Chief Complaint: Left flank/abdominal pain. Constant now. Symptoms: Above 3 weeks ago Frequency: 3 weeks Pertinent Negatives: Patient denies vomiting, diarrhea Disposition: [] ED /[] Urgent Care (no appt availability in office) / [x] Appointment(In office/virtual)/ []  [place] [facility]/ [] Home Care/ [] Refused Recommended Disposition /[] [facility] [facility]/ []  Follow-up with PCP Additional Notes: Agrees with appointment.  Reason for Disposition  [1] MODERATE pain (e.g., interferes with normal activities) AND [2] pain comes and goes (cramps) AND [3] present > 24 hours  (Exception: Pain with Vomiting or Diarrhea - see that Guideline.)  Answer Assessment - Initial Assessment Questions 1. LOCATION: "Where does it hurt?"      Left side, flank 2. RADIATION: "Does the pain shoot anywhere else?" (e.g., chest, back)     No 3. ONSET: "When did the pain begin?" (e.g., minutes, hours or days ago)      3 weeks 4. SUDDEN: "Gradual or sudden onset?"     Gradual 5. PATTERN "Does the pain come and go, or is it constant?"    - If it comes and goes: "How long does it last?" "Do you have pain now?"     (Note: Comes and goes means the pain is intermittent. It goes away completely between bouts.)    - If constant: "Is it getting better, staying the same, or getting worse?"      (Note: Constant means the pain never goes away completely; most serious pain is constant and gets worse.)      Constant 6. SEVERITY: "How bad is the pain?"  (e.g., Scale 1-10; mild, moderate, or severe)    - MILD (1-3): Doesn't interfere with normal activities, abdomen soft and not tender to touch.     - MODERATE (4-7): Interferes with normal activities or awakens from sleep, abdomen tender to touch.     - SEVERE (8-10): Excruciating pain, doubled over, unable to do any normal activities.       Now - 5-6 7. RECURRENT SYMPTOM: "Have you ever had this type of stomach pain  before?" If Yes, ask: "When was the last time?" and "What happened that time?"      no 8. CAUSE: "What do you think is causing the stomach pain?"     Unsure 9. RELIEVING/AGGRAVATING FACTORS: "What makes it better or worse?" (e.g., antacids, bending or twisting motion, bowel movement)     No 10. OTHER SYMPTOMS: "Do you have any other symptoms?" (e.g., back pain, diarrhea, fever, urination pain, vomiting)       Nausea 11. PREGNANCY: "Is there any chance you are pregnant?" "When was your last menstrual period?"       No  Protocols used: Abdominal Pain - [facility]

## 2024-04-22 ENCOUNTER — Ambulatory Visit: Admitting: Internal Medicine

## 2024-04-22 NOTE — Progress Notes (Deleted)
 Patient reports pain of the L flank and abdomen that started ***. It is constant***, and *** in nature. She rates it a *** at it's worst, *** at it's best. She denies inciting events like falls, trauma, new exercises, moving, etc.*** No urinary complaints.*** She has tried *** for the pain.  Plan:

## 2024-04-26 ENCOUNTER — Encounter: Payer: 59 | Admitting: Physical Therapy

## 2024-05-03 ENCOUNTER — Encounter: Payer: 59 | Admitting: Physical Therapy

## 2024-05-06 ENCOUNTER — Ambulatory Visit (HOSPITAL_COMMUNITY)

## 2024-05-09 ENCOUNTER — Telehealth: Payer: Self-pay

## 2024-05-09 NOTE — Telephone Encounter (Signed)
 Left message to return call for pre-op of her urethral bulking procedure

## 2024-05-10 NOTE — Telephone Encounter (Signed)
 [name] is called to provide pre-procedural instructions for her [] Cystoscopy, [] Bladder Botox or [] Urethral Bulking. She is scheduled on 05/12/2024.  The patient is having worsening urinary symptoms to her urinary patterns. The patient is experiencing a funny feeling per her description. [x] The patient is scheduled to come in Wednesday 05-11-2024 on a non-provider visit to run a POC Urinalysis.  [] The patient cannot come in for a pre-procedural Urinalysis therefore discussed with provider to determine treatment prior to procedure.   [] The patient has been prescribed the pre-procedural antibiotics. The patient is reminded to take the prescribed antibiotics the morning of the procedure, an hour before and the morning of the following day.   [x] The patient has not previously been prescribed the pre-procedural anitbiotics, review with patient her medication allergies to determine the antibiotic prescription needed.  [] Since no allergies, a prescription for Bactrim  DS 800mg  #2, take 1 the morning of the procedure and 1 the morning after the procedure and one the morning after the procedure.  [] Since the patient is allergic to Sulfa , the patient will be prescribed Macrobid 100mg  #2, take 1 the morning of the procedure and 1 the morning after the procedure.  The patient is reminded to arrive 5 minutes prior to the scheduled appointment time and that a urine sample will need to be collected the upon arrival for the procedure.

## 2024-05-11 ENCOUNTER — Ambulatory Visit (INDEPENDENT_AMBULATORY_CARE_PROVIDER_SITE_OTHER)

## 2024-05-11 VITALS — BP 146/88 | HR 84 | Temp 98.0°F

## 2024-05-11 DIAGNOSIS — R300 Dysuria: Secondary | ICD-10-CM

## 2024-05-11 LAB — POCT URINALYSIS DIPSTICK
Bilirubin, UA: NEGATIVE
Blood, UA: NEGATIVE
Glucose, UA: NEGATIVE
Leukocytes, UA: NEGATIVE
Nitrite, UA: NEGATIVE
Protein, UA: NEGATIVE
Spec Grav, UA: 1.01 (ref 1.010–1.025)
Urobilinogen, UA: 0.2 U/dL
pH, UA: 5 (ref 5.0–8.0)

## 2024-05-11 NOTE — Progress Notes (Signed)
 [name] arrived today with back flank pain on the left, nausea, and diarrhea. Patient is not experiencing fever, unstable vitals and/or one-sided back flank pain. Needed to check urine before the urethral bulking on 05-12-2024. Due to current GI stomach bug, we will reschedule at this time.  Patient has not had had a recent hospitalization due to UTI.  Last visit in the office was 05/09/2024.  Per protocol:   The most recent Urinalysis completed on 01-26-2024 and wasnormal.  Last Creatinine level  Lab Results  Component Value Date   CREATININE 1.06 02/08/2024    An urine specimen was collected and POCT urinalysis completed. [] A cath specimen was collected due to patient's current condition, symptoms or post-procedural state.  Total urine output by catheter is  Output by Drain (mL) 05/09/24 0701 - 05/09/24 1900 05/09/24 1901 - 05/10/24 0700 05/10/24 0701 - 05/10/24 1900 05/10/24 1901 - 05/11/24 0700 05/11/24 0701 - 05/11/24 1031  Patient has no LDAs of requested type attached.    Aaron Aas    POCT Urine results is normal.  Urine micro was not sent per protocol for abnormal urinalysis.  Urine culture was not sent per protocol for abnormal urinalysis.     [] Pt was notified of positive urine results and plan for additional urine testing. We will contact you within the next 3-4 days with these results.  [] No Prescription was sent to your pharmacy.  The additional testing will indicate if a prescription is needed.   [] Patient was notified of abnormal urine results. The following prescription is sent to your preferred pharmacy.  []  Macrobid 100mg  #10 1 tablet by mouth twice daily with food for 5 days      []  Bactrim  DS 800-160mg  #6 1 tablet by mouth twice daily for 3 days        []  Due to your current medication allergies, an alternate prescription was discussed with your provider and will be prescribed and sent to your pharmacy.  [] You can take over the counter AZO two tablets up to three times a day for  two days.  Take AZO tablets with a full glass of water . AZO will turn your urine orange, this is normal.   [] The patient was notified of negative urine results.  If symptoms persist, you may take over the counter AZO two tablets up to three times a day for two days.  AZO will turn your urine orange, this is normal.  Contact the office back to schedule an appointment if your symptoms persist or worsen or you develop additional symptoms.       CC'd note to patient's provider.

## 2024-05-11 NOTE — Patient Instructions (Signed)
 Please keep any scheduled follow ups.  It was a pleasure to see you today!  Thank you for trusting me with your care!

## 2024-05-12 ENCOUNTER — Ambulatory Visit: Admitting: Obstetrics

## 2024-05-17 ENCOUNTER — Ambulatory Visit: Payer: 59 | Admitting: Dermatology
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
## 2024-05-20 ENCOUNTER — Encounter: Payer: Self-pay | Admitting: Neurology

## 2024-05-20 ENCOUNTER — Encounter: Admitting: Family Medicine

## 2024-05-20 ENCOUNTER — Ambulatory Visit: Admitting: Neurology

## 2024-05-20 ENCOUNTER — Ambulatory Visit (AMBULATORY_SURGERY_CENTER)

## 2024-05-20 VITALS — Ht 66.0 in | Wt 263.0 lb

## 2024-05-20 DIAGNOSIS — Z1211 Encounter for screening for malignant neoplasm of colon: Secondary | ICD-10-CM

## 2024-05-20 MED ORDER — NA SULFATE-K SULFATE-MG SULF 17.5-3.13-1.6 GM/177ML PO SOLN
1.0000 | Freq: Once | ORAL | 0 refills | Status: AC
Start: 2024-05-20 — End: 2024-05-20

## 2024-05-20 NOTE — Progress Notes (Signed)
 No egg or soy allergy known to patient  No issues known to pt with past sedation with any surgeries or procedures Patient denies ever being told they had issues or difficulty with intubation  No FH of Malignant Hyperthermia Pt is not on diet pills Pt is not on  home 02  Pt is not on blood thinners  Pt denies issues with constipation  No A fib or A flutter Have any cardiac testing pending--no  LOA: independent  Prep: suprep    PV completed with patient. Prep instructions sent to home address.

## 2024-06-03 ENCOUNTER — Encounter: Payer: Self-pay | Admitting: Gastroenterology

## 2024-06-06 ENCOUNTER — Telehealth: Payer: Self-pay | Admitting: *Deleted

## 2024-06-06 ENCOUNTER — Telehealth: Payer: Self-pay | Admitting: Nurse Practitioner

## 2024-06-06 NOTE — Telephone Encounter (Signed)
 Inbound call from patient requesting a call to discuss prep instructions further. States she has not yet received prep instructions in the mail. Also states she is unable to open email that was sent with instructions. Please advise, thank you

## 2024-06-06 NOTE — Telephone Encounter (Signed)
 RTC to patient.  Advised patient to call the GI doctor's office for the instructions on how to take the Colonoscopy prep.   Patient was given [facility] as she states they were going to do the procedure and she has nor received instructions from that office. Copied from CRM [id number]. Topic: General - Other >> Jun 06, 2024 11:03 AM [name] wrote: Reason for CRM: Patient request call back on instruction on how to drink the medication for  her colonoscopy [phone]

## 2024-06-06 NOTE — Telephone Encounter (Signed)
 RN attempted to return call to patient who states she has not yet received her prep instructions for her upcoming colonoscopy on 06/16/24. RN received patient's vm. RN was able to leave detailed message confirming procedure date, time, and location and providing information about instructions being mailed to her, medication restrictions starting on 6/19, diet adjustments starting on 6/21, and information regarding no solid food and clear liquids only the day before her procedure.   RN communicated that instructions should arrive any day, but that she will mail instructions again today. RN communicated that these instructions will provide details regarding how to take the prep medication on the day before her procedure.  RN asked patient to call with any other questions.

## 2024-06-08 ENCOUNTER — Telehealth: Payer: Self-pay | Admitting: Gastroenterology

## 2024-06-08 ENCOUNTER — Ambulatory Visit: Payer: Self-pay | Admitting: Internal Medicine

## 2024-06-08 NOTE — Telephone Encounter (Signed)
 Procedure:Colonoscopy Procedure date: 06/16/24 Procedure location: [facility] Arrival Time: 9:00 am Spoke with the patient Y/N: Yes Any prep concerns? No  Has the patient obtained the prep from the pharmacy ? Yes Do you have a care partner and transportation: Yes Any additional concerns? No

## 2024-06-09 ENCOUNTER — Ambulatory Visit: Admitting: Obstetrics

## 2024-06-10 ENCOUNTER — Encounter (HOSPITAL_COMMUNITY): Payer: Self-pay | Admitting: Gastroenterology

## 2024-06-10 ENCOUNTER — Other Ambulatory Visit: Payer: Self-pay

## 2024-06-10 NOTE — Progress Notes (Signed)
 For Anesthesia: PCP - Ronni Colace, DO  Cardiologist - Odie Benne, MD   Bowel Prep reminder:  Chest x-ray -  EKG - 03/31/24 Stress Test -  ECHO - 03/31/24 Cardiac Cath - 03/05/23 Pacemaker/ICD device last checked: Pacemaker orders received: Device Rep notified:  Spinal Cord Stimulator:N/A  Sleep Study - N/A CPAP -   Fasting Blood Sugar - N/A Checks Blood Sugar _____ times a day Date and result of last Hgb A1c-  Last dose of GLP1 agonist- N/A GLP1 instructions:   Last dose of SGLT-2 inhibitors- N/A SGLT-2 instructions:   Blood Thinner Instructions:N/A Aspirin  Instructions: Last Dose:  Activity level:    Unable to go up a flight of stairs without chest pain and/or shortness of breath    Anesthesia review: Hx: HTN,Palpitations.  Patient denies shortness of breath, fever, cough and chest pain at PAT appointment   Patient verbalized understanding of instructions that were reviewed over the telephone.

## 2024-06-15 ENCOUNTER — Ambulatory Visit (HOSPITAL_COMMUNITY)
Admission: RE | Admit: 2024-06-15 | Discharge: 2024-06-15 | Disposition: A | Discharge status: Home / Self Care | Source: Ambulatory Visit | Attending: Cardiology | Admitting: Cardiology

## 2024-06-15 DIAGNOSIS — I340 Nonrheumatic mitral (valve) insufficiency: Secondary | ICD-10-CM

## 2024-06-15 LAB — ECHOCARDIOGRAM COMPLETE
Area-P 1/2: 3.5 cm2
S' Lateral: 3.99 cm

## 2024-06-16 ENCOUNTER — Ambulatory Visit (HOSPITAL_COMMUNITY)
Admission: RE | Admit: 2024-06-16 | Discharge: 2024-06-16 | Disposition: A | Discharge status: Home / Self Care | Attending: Gastroenterology | Admitting: Gastroenterology

## 2024-06-16 ENCOUNTER — Other Ambulatory Visit: Payer: Self-pay

## 2024-06-16 ENCOUNTER — Encounter (HOSPITAL_COMMUNITY): Payer: Self-pay | Admitting: Gastroenterology

## 2024-06-16 ENCOUNTER — Ambulatory Visit (HOSPITAL_COMMUNITY): Admitting: Registered Nurse

## 2024-06-16 ENCOUNTER — Encounter (HOSPITAL_COMMUNITY): Payer: Self-pay

## 2024-06-16 DIAGNOSIS — D123 Benign neoplasm of transverse colon: Secondary | ICD-10-CM

## 2024-06-16 DIAGNOSIS — I340 Nonrheumatic mitral (valve) insufficiency: Secondary | ICD-10-CM | POA: Insufficient documentation

## 2024-06-16 DIAGNOSIS — R002 Palpitations: Secondary | ICD-10-CM | POA: Insufficient documentation

## 2024-06-16 DIAGNOSIS — Z860100 Personal history of colon polyps, unspecified: Secondary | ICD-10-CM | POA: Diagnosis not present

## 2024-06-16 DIAGNOSIS — K6289 Other specified diseases of anus and rectum: Secondary | ICD-10-CM | POA: Diagnosis not present

## 2024-06-16 DIAGNOSIS — K5730 Diverticulosis of large intestine without perforation or abscess without bleeding: Secondary | ICD-10-CM | POA: Insufficient documentation

## 2024-06-16 DIAGNOSIS — K648 Other hemorrhoids: Secondary | ICD-10-CM | POA: Insufficient documentation

## 2024-06-16 DIAGNOSIS — Z1211 Encounter for screening for malignant neoplasm of colon: Secondary | ICD-10-CM

## 2024-06-16 DIAGNOSIS — K6389 Other specified diseases of intestine: Secondary | ICD-10-CM | POA: Diagnosis not present

## 2024-06-16 DIAGNOSIS — I10 Essential (primary) hypertension: Secondary | ICD-10-CM | POA: Insufficient documentation

## 2024-06-16 DIAGNOSIS — F17210 Nicotine dependence, cigarettes, uncomplicated: Secondary | ICD-10-CM | POA: Diagnosis not present

## 2024-06-16 HISTORY — PX: COLONOSCOPY: SHX5424

## 2024-06-16 HISTORY — DX: Palpitations: R00.2

## 2024-06-16 SURGERY — COLONOSCOPY
Anesthesia: Monitor Anesthesia Care

## 2024-06-16 MED ORDER — PROPOFOL 500 MG/50ML IV EMUL
INTRAVENOUS | Status: DC | PRN
  Administered 2024-06-16: 20 mg via INTRAVENOUS
  Administered 2024-06-16: 140 ug/kg/min via INTRAVENOUS

## 2024-06-16 MED ORDER — LIDOCAINE 2% (20 MG/ML) 5 ML SYRINGE
INTRAMUSCULAR | Status: DC | PRN
  Administered 2024-06-16: 40 mg via INTRAVENOUS

## 2024-06-16 MED ORDER — PROPOFOL 1000 MG/100ML IV EMUL
INTRAVENOUS | Status: AC
  Filled 2024-06-16: qty 100

## 2024-06-16 MED ORDER — SODIUM CHLORIDE 0.9 % IV SOLN: INTRAVENOUS | Status: DC

## 2024-06-16 NOTE — Interval H&P Note (Signed)
 History and Physical Interval Note:  06/16/2024 9:51 AM  [name]  has presented today for surgery, with the diagnosis of hx colon polyps.  The various methods of treatment have been discussed with the patient and family. After consideration of risks, benefits and other options for treatment, the patient has consented to  Procedure(s): COLONOSCOPY (N/A) as a surgical intervention.  The patient's history has been reviewed, patient examined, no change in status, stable for surgery.  I have reviewed the patient's chart and labs.  Questions were answered to the patient's satisfaction.     Victory LITTIE Brand III

## 2024-06-16 NOTE — Op Note (Signed)
 [facility] Patient Name: [name] Procedure Date: 06/16/2024 MRN: [id number] Attending MD: Victory CROME. Legrand , MD, [id number] Date of Birth: 1966-05-21 CSN: [id number] Age: 58 Admit Type: Outpatient Procedure:                Colonoscopy Indications:              Screening for colorectal malignant neoplasm                           hyperplastic polyp without TA or SSP last                            colonoscopy 2014 Providers:                Victory CROME. Legrand, MD, Hoy Penner, RN, Curtistine Bishop, Technician Referring MD:             Victory CROME. Legrand, MD Medicines:                Monitored Anesthesia Care Complications:            No immediate complications. Estimated Blood Loss:     Estimated blood loss was minimal. Procedure:                Pre-Anesthesia Assessment:                           - Prior to the procedure, a History and Physical                            was performed, and patient medications and                            allergies were reviewed. The patient's tolerance of                            previous anesthesia was also reviewed. The risks                            and benefits of the procedure and the sedation                            options and risks were discussed with the patient.                            All questions were answered, and informed consent                            was obtained. Prior Anticoagulants: The patient has                            taken no anticoagulant or antiplatelet agents. ASA  Grade Assessment: IV - A patient with severe                            systemic disease that is a constant threat to                            life.(moderate to severe mitral regurgitation and                            BMI > 40. After reviewing the risks and benefits,                            the patient was deemed in satisfactory condition to                             undergo the procedure.                           After obtaining informed consent, the colonoscope                            was passed under direct vision. Throughout the                            procedure, the patient's blood pressure, pulse, and                            oxygen  saturations were monitored continuously. The                            CF-HQ190L ([id number]) Olympus colonoscope was                            introduced through the anus and advanced to the the                            cecum, identified by appendiceal orifice and                            ileocecal valve. The colonoscopy was performed                            without difficulty. The patient tolerated the                            procedure well. The quality of the bowel                            preparation was excellent. The ileocecal valve,                            appendiceal orifice, and rectum were photographed. Scope In: 10:08:27 AM Scope Out: 10:23:15 AM Scope Withdrawal Time: 0 hours 9 minutes 16 seconds  Total Procedure Duration: 0  hours 14 minutes 48 seconds  Findings:      The perianal and digital rectal examinations were normal.      Repeat examination of right colon under NBI performed.      Scattered diverticula were found in the entire colon.      A diminutive polyp was found in the transverse colon. The polyp was       semi-sessile. The polyp was removed with a cold snare. Resection and       retrieval were complete.      Internal hemorrhoids were found. There was also a hypertrophied anal       papilla.      The exam was otherwise without abnormality on direct and retroflexion       views. Impression:               - Diverticulosis in the entire examined colon.                           - One diminutive polyp in the transverse colon,                            removed with a cold snare. Resected and retrieved.                           - Internal hemorrhoids.                            - The examination was otherwise normal on direct                            and retroflexion views. Moderate Sedation:      MAC sedation used Recommendation:           - Patient has a contact number available for                            emergencies. The signs and symptoms of potential                            delayed complications were discussed with the                            patient. Return to normal activities tomorrow.                            Written discharge instructions were provided to the                            patient.                           - Resume previous diet.                           - Continue present medications.                           - Await pathology results.                           -  Repeat colonoscopy is recommended for                            surveillance. The colonoscopy date will be                            determined after pathology results from today's                            exam become available for review. Procedure Code(s):        --- Professional ---                           [phone], Colonoscopy, flexible; with removal of                            tumor(s), polyp(s), or other lesion(s) by snare                            technique Diagnosis Code(s):        --- Professional ---                           Z12.11, Encounter for screening for malignant                            neoplasm of colon                           K64.8, Other hemorrhoids                           D12.3, Benign neoplasm of transverse colon (hepatic                            flexure or splenic flexure)                           K57.30, Diverticulosis of large intestine without                            perforation or abscess without bleeding CPT copyright 2022 American Medical Association. All rights reserved. The codes documented in this report are preliminary and upon coder review may  be revised to meet current compliance  requirements. [name], MD 06/16/2024 10:29:52 AM This report has been signed electronically. Number of Addenda: 0

## 2024-06-16 NOTE — Discharge Instructions (Signed)

## 2024-06-16 NOTE — Anesthesia Postprocedure Evaluation (Signed)
 Anesthesia Post Note  Patient: Rhayne Chatwin  Procedure(s) Performed: COLONOSCOPY     Patient location during evaluation: Endoscopy Anesthesia Type: MAC Level of consciousness: oriented, patient cooperative and awake and alert Pain management: pain level controlled Vital Signs Assessment: post-procedure vital signs reviewed and stable Respiratory status: spontaneous breathing, nonlabored ventilation and respiratory function stable Cardiovascular status: blood pressure returned to baseline and stable Postop Assessment: no apparent nausea or vomiting and adequate PO intake Anesthetic complications: no  No notable events documented.  Last Vitals:  Vitals:   06/16/24 1040 06/16/24 1048  BP: (!) 168/100   Pulse: 71 63  Resp: (!) 22 (!) 22  Temp:    SpO2: 98% 100%    Last Pain:  Vitals:   06/16/24 1040  TempSrc:   PainSc: 0-No pain                 Amada Hallisey,E. Sangita Zani

## 2024-06-16 NOTE — Anesthesia Procedure Notes (Signed)
 Procedure Name: MAC Date/Time: 06/16/2024 9:57 AM  Performed by: Memory Armida LABOR, CRNAPre-anesthesia Checklist: Patient identified, Emergency Drugs available, Suction available, Patient being monitored and Timeout performed Patient Re-evaluated:Patient Re-evaluated prior to induction Oxygen  Delivery Method: Simple face mask Placement Confirmation: positive ETCO2 Dental Injury: Teeth and Oropharynx as per pre-operative assessment

## 2024-06-16 NOTE — H&P (Signed)
 History and Physical:  This patient presents for endoscopic testing for: Colorectal cancer screening  This is a 58 year old woman here today for screening colonoscopy.  At the time of her last screening colonoscopy in 2014 she had a hyperplastic polyp but no adenomatous or serrated polyps.  She denies a family history of colorectal cancer. Patient denies chronic abdominal pain, rectal bleeding, constipation or diarrhea.  Her procedure is being done in the hospital outpatient endoscopy department today because of her moderate to severe mitral regurgitation. When she was evaluated at our office several months ago she was noted to have normocytic anemia which had resolved on subsequent labs with normal iron studies as well.  Patient is otherwise without complaints or active issues today.   Past Medical History: Past Medical History:  Diagnosis Date   Anemia    COVID-19 virus infection 11/2020   Fibroids    Heart palpitations    History of    History of blood transfusion    with knee replacement   History of colon polyps    Hypertension    Hypertension    no current meds for htn   Knee joint injury    Morbid obesity (HCC)    Palpitations    Pneumonia    remote history   Thyroid  goiter    bilateral goiter and cyst follow up every 2 years Dr. Arlana   Tobacco abuse      Past Surgical History: Past Surgical History:  Procedure Laterality Date   ABDOMINAL AORTOGRAM N/A 03/05/2023   Procedure: ABDOMINAL AORTOGRAM;  Surgeon: Dann Candyce RAMAN, MD;  Location: [facility] INVASIVE CV LAB;  Service: Cardiovascular;  Laterality: N/A;   ANKLE SURGERY Right 2009   broken ankle    arthroscopic surgery to knee Bilateral    BREAST BIOPSY Left 03/24/2019   BUBBLE STUDY  03/05/2023   Procedure: BUBBLE STUDY;  Surgeon: Barbaraann Darryle Ned, MD;  Location: [facility] ENDOSCOPY;  Service: Cardiovascular;;   CESAREAN SECTION     COLONOSCOPY     HAND SURGERY Bilateral    JOINT REPLACEMENT Bilateral     knees   REPLACEMENT TOTAL KNEE Right 2012   RIGHT/LEFT HEART CATH AND CORONARY ANGIOGRAPHY N/A 03/05/2023   Procedure: RIGHT/LEFT HEART CATH AND CORONARY ANGIOGRAPHY;  Surgeon: Dann Candyce RAMAN, MD;  Location: [facility] INVASIVE CV LAB;  Service: Cardiovascular;  Laterality: N/A;   SHOULDER OPEN ROTATOR CUFF REPAIR Right 10/12/2019   Procedure: Right shoulder rotator cuff repair with graft and anchors;  Surgeon: Heide Ingle, MD;  Location: [facility] ORS;  Service: Orthopedics;  Laterality: Right;    TEE WITHOUT CARDIOVERSION N/A 03/05/2023   Procedure: TRANSESOPHAGEAL ECHOCARDIOGRAM (TEE);  Surgeon: Barbaraann Darryle Ned, MD;  Location: [facility] ENDOSCOPY;  Service: Cardiovascular;  Laterality: N/A;   TOTAL KNEE ARTHROPLASTY Left 01/03/2014   Procedure: LEFT TOTAL KNEE ARTHROPLASTY;  Surgeon: Ingle DELENA Heide, MD;  Location: [facility] ORS;  Service: Orthopedics;  Laterality: Left;   TUBAL LIGATION      Allergies: Allergies  Allergen Reactions   Tramadol  Itching   Ibuprofen  Nausea And Vomiting   Metronidazole  Itching and Nausea And Vomiting   Penicillins Itching    Denies hives, or airway issues.     Outpatient Meds: Current Facility-Administered Medications  Medication Dose Route Frequency Provider Last Rate Last Admin   0.9 %  sodium chloride  infusion   Intravenous Continuous Legrand Victory CROME III, MD 20 mL/hr at 06/16/24 9062 Continued from Pre-op at 06/16/24 0937      ___________________________________________________________________ Objective  Exam:  BP (!) 149/83   Pulse 81   Temp 97.8 F (36.6 C) (Tympanic)   Resp 15   Ht 5' 6 (1.676 m)   Wt 118.8 kg   LMP 06/07/2016   SpO2 98%   BMI 42.29 kg/m   CV: regular , S1/S2 Resp: clear to auscultation bilaterally, normal RR and effort noted GI: soft, no tenderness, with active bowel sounds.   Assessment: Average risk for colorectal cancer   Plan: Colonoscopy   The benefits and risks of the planned procedure(s) were  described in detail with the patient or (when appropriate) their health care proxy.  Risks were outlined as including, but not limited to, bleeding, infection, perforation, adverse medication reaction leading to cardiac or pulmonary decompensation, pancreatitis (if ERCP).  The limitation of incomplete mucosal visualization was also discussed.  No guarantees or warranties were given.  The patient is appropriate for an endoscopic procedure in the ambulatory setting.   - Victory Brand, MD

## 2024-06-16 NOTE — Transfer of Care (Signed)
 Immediate Anesthesia Transfer of Care Note  Patient: [name]  Procedure(s) Performed: COLONOSCOPY  Patient Location: PACU and Endoscopy Unit  Anesthesia Type:MAC  Level of Consciousness: awake, alert , oriented, and patient cooperative  Airway & Oxygen  Therapy: Patient Spontanous Breathing and Patient connected to face mask oxygen   Post-op Assessment: Report given to RN, Post -op Vital signs reviewed and stable, and Patient moving all extremities  Post vital signs: Reviewed and stable  Last Vitals:  Vitals Value Taken Time  BP 111/74 06/16/24 10:30  Temp    Pulse 70 06/16/24 10:31  Resp 19 06/16/24 10:31  SpO2 100 % 06/16/24 10:31  Vitals shown include unfiled device data.  Last Pain:  Vitals:   06/16/24 0906  TempSrc: Tympanic  PainSc: 0-No pain         Complications: No notable events documented.

## 2024-06-16 NOTE — Anesthesia Preprocedure Evaluation (Signed)
 Anesthesia Evaluation  Patient identified by MRN, date of birth, ID band Patient awake    Reviewed: Allergy & Precautions, NPO status , Patient's Chart, lab work & pertinent test results  History of Anesthesia Complications Negative for: history of anesthetic complications  Airway Mallampati: I  TM Distance: >3 FB Neck ROM: Full    Dental  (+) Caps, Dental Advisory Given   Pulmonary Current SmokerPatient did not abstain from smoking.   breath sounds clear to auscultation       Cardiovascular hypertension, Pt. on medications and Pt. on home beta blockers (-) angina (-) CAD + dysrhythmias (palpitations)  Rhythm:Regular Rate:Normal  06/15/2024 ECHO: EF 50 to 55%. 1. The LV has low normal function, global hypokinesis. There is mild LVH. Grade II diastolic dysfunction (pseudonormalization).   2. RVF is low normal. The right ventricular size is normal.   3. The mitral valve is degenerative. Moderate mitral valve regurgitation. No evidence of mitral stenosis.   4. The aortic valve is tricuspid. Aortic valve regurgitation is not visualized. No aortic stenosis is present.   '24 CATH: No significant coronary artery disease.  Continue with plans for mitral valve regurgitation management.   Neuro/Psych negative neurological ROS     GI/Hepatic negative GI ROS, Neg liver ROS,,,  Endo/Other  BMI 42.3  Renal/GU      Musculoskeletal   Abdominal   Peds  Hematology   Anesthesia Other Findings   Reproductive/Obstetrics                             Anesthesia Physical Anesthesia Plan  ASA: 3  Anesthesia Plan: MAC   Post-op Pain Management: Minimal or no pain anticipated   Induction:   PONV Risk Score and Plan: 1 and Treatment may vary due to age or medical condition  Airway Management Planned: Natural Airway and Simple Face Mask  Additional Equipment: None  Intra-op Plan:   Post-operative Plan:    Informed Consent: I have reviewed the patients History and Physical, chart, labs and discussed the procedure including the risks, benefits and alternatives for the proposed anesthesia with the patient or authorized representative who has indicated his/her understanding and acceptance.     Dental advisory given  Plan Discussed with: CRNA and Surgeon  Anesthesia Plan Comments:        Anesthesia Quick Evaluation

## 2024-06-18 ENCOUNTER — Encounter (HOSPITAL_COMMUNITY): Payer: Self-pay | Admitting: Gastroenterology

## 2024-06-20 ENCOUNTER — Other Ambulatory Visit: Payer: Self-pay

## 2024-06-20 ENCOUNTER — Ambulatory Visit: Payer: Self-pay | Admitting: Cardiovascular Disease

## 2024-06-20 ENCOUNTER — Ambulatory Visit: Payer: Self-pay | Admitting: Student

## 2024-06-20 ENCOUNTER — Encounter: Payer: Self-pay | Admitting: Student

## 2024-06-20 VITALS — BP 165/83 | HR 72 | Temp 97.8°F | Ht 66.0 in | Wt 260.2 lb

## 2024-06-20 DIAGNOSIS — I10 Essential (primary) hypertension: Secondary | ICD-10-CM

## 2024-06-20 DIAGNOSIS — R21 Rash and other nonspecific skin eruption: Secondary | ICD-10-CM | POA: Diagnosis not present

## 2024-06-20 LAB — SURGICAL PATHOLOGY

## 2024-06-20 MED ORDER — LISINOPRIL 20 MG PO TABS: 40.0000 mg | ORAL_TABLET | Freq: Every day | ORAL | 3 refills | Status: DC

## 2024-06-20 MED ORDER — TERBINAFINE HCL 1 % EX CREA: 1.0000 | TOPICAL_CREAM | Freq: Two times a day (BID) | CUTANEOUS | 0 refills | Status: DC

## 2024-06-20 NOTE — Patient Instructions (Addendum)
 Thank you so much for coming to the clinic today!   We are going to increase your lisinopril  to 40mg , please discuss that with your cough with Dr. Thomos. I have also sent in some fungus cream for your rash. Take care!   If you have any questions please feel free to the call the clinic at anytime at [phone]. It was a pleasure seeing you!  Best, Dr. Harjas Biggins

## 2024-06-20 NOTE — Assessment & Plan Note (Signed)
 Blood pressure elevated today 162/80, on recheck is 156/83.  Only outpatient regimen is lisinopril  10 mg, which cardiology is managing.  I think it is reasonable to increase her lisinopril  to 40 mg she has a cardiology appointment on July 8, and further titration can be done from there.  Plan: - Increase lisinopril  to 40 mg

## 2024-06-20 NOTE — Progress Notes (Unsigned)
 CC: HTN/Foot rash  HPI:  [name] is a 58 y.o. female living with a history stated below and presents today for hypertension and rash on her foot. Please see problem based assessment and plan for additional details.  Past Medical History:  Diagnosis Date   Anemia    COVID-19 virus infection 11/2020   Fibroids    Heart palpitations    History of    History of blood transfusion    with knee replacement   History of colon polyps    Hypertension    Hypertension    no current meds for htn   Knee joint injury    Morbid obesity (HCC)    Palpitations    Pneumonia    remote history   Thyroid  goiter    bilateral goiter and cyst follow up every 2 years Dr. Arlana   Tobacco abuse     Current Outpatient Medications on File Prior to Visit  Medication Sig Dispense Refill   DULoxetine  (CYMBALTA ) 30 MG capsule Take 30 mg by mouth daily. (Patient not taking: Reported on 05/20/2024)     furosemide  (LASIX ) 40 MG tablet Take 1 tablet (40 mg total) by mouth daily. 90 tablet 0   methocarbamol  (ROBAXIN ) 500 MG tablet Take 500 mg 3 times a day by oral route as needed for 10 days. (Patient not taking: Reported on 05/20/2024)     metoprolol  succinate (TOPROL -XL) 25 MG 24 hr tablet TAKE 1 TABLET(25 MG) BY MOUTH DAILY. MAY TAKE 1 EXTRA TABLET AS NEEDED FOR HEART RACING 180 tablet 3   topiramate  (TOPAMAX ) 25 MG tablet Take 2 tablets (50 mg total) by mouth at bedtime. 60 tablet 5   No current facility-administered medications on file prior to visit.    Family History  Problem Relation Age of Onset   Hypertension Mother    Dementia Mother    Hypertension Father    Atrial fibrillation Father    Cancer Maternal Grandmother        ? lung   Colon cancer Neg Hx    Stomach cancer Neg Hx    Esophageal cancer Neg Hx     Social History   Socioeconomic History   Marital status: Single    Spouse name: Not on file   Number of children: 2   Years of education: Not on file   Highest  education level: Not on file  Occupational History   Occupation: disabled    Employer: DISABLED    Occupation: disable  Tobacco Use   Smoking status: Every Day    Current packs/day: 0.20    Average packs/day: 0.2 packs/day for 20.0 years (4.0 ttl pk-yrs)    Types: Cigarettes   Smokeless tobacco: Never   Tobacco comments:    1-2  cigs per day/ a pack in 2 weeks 10/18- 23cigs a day-11/11/23  Vaping Use   Vaping status: Never Used  Substance and Sexual Activity   Alcohol use: Yes    Alcohol/week: 2.0 standard drinks of alcohol    Types: 2 Standard drinks or equivalent per week    Comment: occasionally once per month   Drug use: No   Sexual activity: Not Currently    Birth control/protection: Surgical  Other Topics Concern   Not on file  Social History Narrative   ** Merged History Encounter ** Applying for disability, previously worked in [employer]   Right handed   Caffeine rarely   Live in two story  What is your current occupation? disability   Do you live at home alone?   Who lives with you? son   What type of home do you live in: 1 story or 2 story? Two story appartment          Social Drivers of Health   Financial Resource Strain: Low Risk  (09/02/2023)   Overall Financial Resource Strain (CARDIA)    Difficulty of Paying Living Expenses: Not hard at all  Food Insecurity: Low Risk  (03/29/2024)   Received from [facility]   Hunger Vital Sign    Within the past 12 months, you worried that your food would run out before you got money to buy more: Never true    Within the past 12 months, the food you bought just didn't last and you didn't have money to get more. : Never true  Transportation Needs: No Transportation Needs (03/29/2024)   Received from [facility]    In the past 12 months, has lack of reliable transportation kept you from medical appointments, meetings, work or from getting things needed for daily living? : No  Physical  Activity: Insufficiently Active (09/02/2023)   Exercise Vital Sign    Days of Exercise per Week: 2 days    Minutes of Exercise per Session: 60 min  Stress: Stress Concern Present (09/02/2023)   [employer] of Occupational Health - Occupational Stress Questionnaire    Feeling of Stress : To some extent  Social Connections: Socially Isolated (09/02/2023)   Social Connection and Isolation Panel    Frequency of Communication with Friends and Family: Never    Frequency of Social Gatherings with Friends and Family: Never    Attends Religious Services: More than 4 times per year    Active Member of [employer] or Organizations: No    Attends Banker Meetings: Never    Marital Status: Never married  Intimate Partner Violence: Not At Risk (09/02/2023)   Humiliation, Afraid, Rape, and Kick questionnaire    Fear of Current or Ex-Partner: No    Emotionally Abused: No    Physically Abused: No    Sexually Abused: No    Review of Systems: ROS negative except for what is noted on the assessment and plan.  Vitals:   06/20/24 1323 06/20/24 1329 06/20/24 1345  BP: (!) 162/80 (!) 156/83 (!) 165/83  Pulse: 81 79 72  Temp: 97.8 F (36.6 C)    TempSrc: Oral    SpO2: 98%    Weight: 260 lb 3.2 oz (118 kg)    Height: 5' 6 (1.676 m)      Physical Exam: Constitutional: well-appearing female  in no acute distress HENT: normocephalic atraumatic, mucous membranes moist Eyes: conjunctiva non-erythematous Neck: supple Cardiovascular: regular rate and rhythm, no m/r/g Pulmonary/Chest: normal work of breathing on room air, lungs clear to auscultation bilaterally Abdominal: soft, non-tender, non-distended MSK: normal bulk and tone Neurological: alert & oriented x 3, 5/5 strength in bilateral upper and lower extremities, normal gait Skin: warm and dry, scaly rash present on medial foot, extending 6 inches        Assessment & Plan:   Essential hypertension Blood pressure elevated today  162/80, on recheck is 156/83.  Only outpatient regimen is lisinopril  10 mg, which cardiology is managing.  I think it is reasonable to increase her lisinopril  to 40 mg she has a cardiology appointment on July 8, and further titration can be done from there.  Plan: - Increase lisinopril   to 40 mg  Rash of foot Patient states that every summer for the last 10 years or so she has been developing this rash on her right medial foot located in the plantar area.  She recently went to a dermatologist who gave her clobetasol cream, which she has been using twice a day has not been working.  She describes it as  pruritic, and feels like it is spreading.  The rash is scaly, dry, cracking.  I do not see any surrounding erythema.  This rash looks classically like a fungal infection, will treat with terbinafine cream.   Patient discussed with Dr. Machen  Jago Carton, M.D. [facility] Internal Medicine, PGY-3 Pager: [phone] Date 06/21/2024 Time 7:30 AM

## 2024-06-21 ENCOUNTER — Encounter: Payer: Self-pay | Admitting: Student

## 2024-06-21 DIAGNOSIS — R21 Rash and other nonspecific skin eruption: Secondary | ICD-10-CM | POA: Insufficient documentation

## 2024-06-21 NOTE — Assessment & Plan Note (Signed)
 Patient states that every summer for the last 10 years or so she has been developing this rash on her right medial foot located in the plantar area.  She recently went to a dermatologist who gave her clobetasol cream, which she has been using twice a day has not been working.  She describes it as  pruritic, and feels like it is spreading.  The rash is scaly, dry, cracking.  I do not see any surrounding erythema.  This rash looks classically like a fungal infection, will treat with terbinafine cream.
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
## 2024-06-27 NOTE — Progress Notes (Unsigned)
 Patient no showed appointment

## 2024-06-28 ENCOUNTER — Ambulatory Visit (INDEPENDENT_AMBULATORY_CARE_PROVIDER_SITE_OTHER): Admitting: Student

## 2024-06-28 DIAGNOSIS — R002 Palpitations: Secondary | ICD-10-CM

## 2024-06-28 DIAGNOSIS — I428 Other cardiomyopathies: Secondary | ICD-10-CM

## 2024-06-28 DIAGNOSIS — I340 Nonrheumatic mitral (valve) insufficiency: Secondary | ICD-10-CM

## 2024-06-28 DIAGNOSIS — I10 Essential (primary) hypertension: Secondary | ICD-10-CM

## 2024-06-29 ENCOUNTER — Ambulatory Visit: Payer: Self-pay | Admitting: Gastroenterology

## 2024-07-04 NOTE — Progress Notes (Signed)
 Internal Medicine Clinic Attending  Case discussed with the resident at the time of the visit.  We reviewed the resident's history and exam and pertinent patient test results.  I agree with the assessment, diagnosis, and plan of care documented in the resident's note.

## 2024-07-04 NOTE — Progress Notes (Deleted)
 New Patient Visit  Subjective:     Patient ID: [name], female    DOB: [birth year], 58 y.o.   MRN: [id number]  No chief complaint on file.   HPI  Discussed the use of AI scribe software for clinical note transcription with the patient, who gave verbal consent to proceed.  History of Present Illness      ROS Per HPI  Outpatient Encounter Medications as of 07/05/2024  Medication Sig   DULoxetine  (CYMBALTA ) 30 MG capsule Take 30 mg by mouth daily. (Patient not taking: Reported on 05/20/2024)   furosemide  (LASIX ) 40 MG tablet Take 1 tablet (40 mg total) by mouth daily.   lisinopril  (ZESTRIL ) 20 MG tablet Take 2 tablets (40 mg total) by mouth daily.   methocarbamol  (ROBAXIN ) 500 MG tablet Take 500 mg 3 times a day by oral route as needed for 10 days. (Patient not taking: Reported on 05/20/2024)   metoprolol  succinate (TOPROL -XL) 25 MG 24 hr tablet TAKE 1 TABLET(25 MG) BY MOUTH DAILY. MAY TAKE 1 EXTRA TABLET AS NEEDED FOR HEART RACING   terbinafine  (LAMISIL ) 1 % cream Apply 1 Application topically 2 (two) times daily.   topiramate  (TOPAMAX ) 25 MG tablet Take 2 tablets (50 mg total) by mouth at bedtime.   No facility-administered encounter medications on file as of 07/05/2024.    Past Medical History:  Diagnosis Date   Anemia    COVID-19 virus infection 11/2020   Fibroids    Heart palpitations    History of    History of blood transfusion    with knee replacement   History of colon polyps    Hypertension    Hypertension    no current meds for htn   Knee joint injury    Morbid obesity (HCC)    Palpitations    Pneumonia    remote history   Thyroid  goiter    bilateral goiter and cyst follow up every 2 years Dr. Arlana   Tobacco abuse     Past Surgical History:  Procedure Laterality Date   ABDOMINAL AORTOGRAM N/A 03/05/2023   Procedure: ABDOMINAL AORTOGRAM;  Surgeon: Dann Candyce RAMAN, MD;  Location[facility] INVASIVE CV LAB;  Service: Cardiovascular;   Laterality: N/A;   ANKLE SURGERY Right 2009   broken ankle    arthroscopic surgery to knee Bilateral    BREAST BIOPSY Left 03/24/2019   BUBBLE STUDY  03/05/2023   Procedure: BUBBLE STUDY;  Surgeon: Barbaraann Darryle Ned, MD;  Location: [facility] ENDOSCOPY;  Service: Cardiovascular;;   CESAREAN SECTION     COLONOSCOPY     COLONOSCOPY N/A 06/16/2024   Procedure: COLONOSCOPY;  Surgeon: Legrand Victory LITTIE DOUGLAS, MD;  Location: [facility];  Service: Gastroenterology;  Laterality: N/A;   HAND SURGERY Bilateral    JOINT REPLACEMENT Bilateral    knees   REPLACEMENT TOTAL KNEE Right 2012   RIGHT/LEFT HEART CATH AND CORONARY ANGIOGRAPHY N/A 03/05/2023   Procedure: RIGHT/LEFT HEART CATH AND CORONARY ANGIOGRAPHY;  Surgeon: Dann Candyce RAMAN, MD;  Location: [facility] INVASIVE CV LAB;  Service: Cardiovascular;  Laterality: N/A;   SHOULDER OPEN ROTATOR CUFF REPAIR Right 10/12/2019   Procedure: Right shoulder rotator cuff repair with graft and anchors;  Surgeon: Heide Ingle, MD;  Location: [facility] ORS;  Service: Orthopedics;  Laterality: Right;    TEE WITHOUT CARDIOVERSION N/A 03/05/2023   Procedure: TRANSESOPHAGEAL ECHOCARDIOGRAM (TEE);  Surgeon: Barbaraann Darryle Ned, MD;  Location: [facility] ENDOSCOPY;  Service: Cardiovascular;  Laterality: N/A;   TOTAL KNEE ARTHROPLASTY  Left 01/03/2014   Procedure: LEFT TOTAL KNEE ARTHROPLASTY;  Surgeon: Tanda DELENA Heading, MD;  Location: [facility] ORS;  Service: Orthopedics;  Laterality: Left;   TUBAL LIGATION      Family History  Problem Relation Age of Onset   Hypertension Mother    Dementia Mother    Hypertension Father    Atrial fibrillation Father    Cancer Maternal Grandmother        ? lung   Colon cancer Neg Hx    Stomach cancer Neg Hx    Esophageal cancer Neg Hx     Social History   Socioeconomic History   Marital status: Single    Spouse name: Not on file   Number of children: 2   Years of education: Not on file   Highest education level: Not on file  Occupational  History   Occupation: disabled    Employer: DISABLED    Occupation: disable  Tobacco Use   Smoking status: Every Day    Current packs/day: 0.20    Average packs/day: 0.2 packs/day for 20.0 years (4.0 ttl pk-yrs)    Types: Cigarettes   Smokeless tobacco: Never   Tobacco comments:    1-2  cigs per day/ a pack in 2 weeks 10/18- 23cigs a day-11/11/23  Vaping Use   Vaping status: Never Used  Substance and Sexual Activity   Alcohol use: Yes    Alcohol/week: 2.0 standard drinks of alcohol    Types: 2 Standard drinks or equivalent per week    Comment: occasionally once per month   Drug use: No   Sexual activity: Not Currently    Birth control/protection: Surgical  Other Topics Concern   Not on file  Social History Narrative   ** Merged History Encounter ** Applying for disability, previously worked in [employer]   Right handed   Caffeine rarely   Live in two story           What is your current occupation? disability   Do you live at home alone?   Who lives with you? son   What type of home do you live in: 1 story or 2 story? Two story appartment          Social Drivers of Health   Financial Resource Strain: Low Risk  (09/02/2023)   Overall Financial Resource Strain (CARDIA)    Difficulty of Paying Living Expenses: Not hard at all  Food Insecurity: Low Risk  (03/29/2024)   Received from [facility]   Hunger Vital Sign    Within the past 12 months, you worried that your food would run out before you got money to buy more: Never true    Within the past 12 months, the food you bought just didn't last and you didn't have money to get more. : Never true  Transportation Needs: No Transportation Needs (03/29/2024)   Received from [facility]    In the past 12 months, has lack of reliable transportation kept you from medical appointments, meetings, work or from getting things needed for daily living? : No  Physical Activity: Insufficiently Active (09/02/2023)    Exercise Vital Sign    Days of Exercise per Week: 2 days    Minutes of Exercise per Session: 60 min  Stress: Stress Concern Present (09/02/2023)   [employer] of Occupational Health - Occupational Stress Questionnaire    Feeling of Stress : To some extent  Social Connections: Socially Isolated (09/02/2023)   Social Connection and Isolation  Panel    Frequency of Communication with Friends and Family: Never    Frequency of Social Gatherings with Friends and Family: Never    Attends Religious Services: More than 4 times per year    Active Member of [employer] or Organizations: No    Attends Banker Meetings: Never    Marital Status: Never married  Intimate Partner Violence: Not At Risk (09/02/2023)   Humiliation, Afraid, Rape, and Kick questionnaire    Fear of Current or Ex-Partner: No    Emotionally Abused: No    Physically Abused: No    Sexually Abused: No       Objective:    LMP 06/07/2016    Physical Exam Vitals and nursing note reviewed.  Constitutional:      General: She is not in acute distress.    Appearance: Normal appearance. She is normal weight.  HENT:     Head: Normocephalic and atraumatic.     Right Ear: External ear normal.     Left Ear: External ear normal.     Nose: Nose normal.     Mouth/Throat:     Mouth: Mucous membranes are moist.     Pharynx: Oropharynx is clear.  Eyes:     Extraocular Movements: Extraocular movements intact.     Pupils: Pupils are equal, round, and reactive to light.  Cardiovascular:     Rate and Rhythm: Normal rate and regular rhythm.     Pulses: Normal pulses.     Heart sounds: Normal heart sounds.  Pulmonary:     Effort: Pulmonary effort is normal. No respiratory distress.     Breath sounds: Normal breath sounds. No wheezing, rhonchi or rales.  Musculoskeletal:        General: Normal range of motion.     Cervical back: Normal range of motion.     Right lower leg: No edema.     Left lower leg: No edema.   Lymphadenopathy:     Cervical: No cervical adenopathy.  Neurological:     General: No focal deficit present.     Mental Status: She is alert and oriented to person, place, and time.  Psychiatric:        Mood and Affect: Mood normal.        Thought Content: Thought content normal.     No results found for any visits on 07/05/24.      Assessment & Plan:   Assessment and Plan Assessment & Plan      No orders of the defined types were placed in this encounter.    No orders of the defined types were placed in this encounter.   No follow-ups on file.  Corean LITTIE Ku, FNP

## 2024-07-05 ENCOUNTER — Encounter: Admitting: Family Medicine

## 2024-07-07 DIAGNOSIS — M792 Neuralgia and neuritis, unspecified: Secondary | ICD-10-CM | POA: Diagnosis not present

## 2024-07-13 ENCOUNTER — Telehealth: Payer: Self-pay

## 2024-07-13 NOTE — Telephone Encounter (Signed)
  Left message to return call.x2 attempts last call made at 4:21pm    [name] is called to provide pre-procedural instructions for her [] Cystoscopy, [] Bladder Botox or [x] Urethral Bulking.She is scheduled on 07/14/2024.

## 2024-07-14 ENCOUNTER — Encounter: Payer: Self-pay | Admitting: Obstetrics

## 2024-07-14 ENCOUNTER — Ambulatory Visit (INDEPENDENT_AMBULATORY_CARE_PROVIDER_SITE_OTHER): Admitting: Obstetrics

## 2024-07-14 VITALS — BP 155/99 | HR 94

## 2024-07-14 DIAGNOSIS — N393 Stress incontinence (female) (male): Secondary | ICD-10-CM | POA: Diagnosis not present

## 2024-07-14 LAB — POCT URINALYSIS DIP (CLINITEK)
Bilirubin, UA: NEGATIVE
Blood, UA: NEGATIVE
Glucose, UA: NEGATIVE mg/dL
Leukocytes, UA: NEGATIVE
Nitrite, UA: NEGATIVE
POC PROTEIN,UA: NEGATIVE
Spec Grav, UA: 1.02 (ref 1.010–1.025)
Urobilinogen, UA: 1 U/dL
pH, UA: 6 (ref 5.0–8.0)

## 2024-07-14 MED ORDER — SULFAMETHOXAZOLE-TRIMETHOPRIM 400-80 MG PO TABS: 1.0000 | ORAL_TABLET | Freq: Once | ORAL | Status: DC

## 2024-07-14 MED ORDER — LIDOCAINE-EPINEPHRINE 1 %-1:100000 IJ SOLN
10.0000 mL | Freq: Once | INTRAMUSCULAR | Status: AC
  Administered 2024-07-14: 10 mL

## 2024-07-14 MED ORDER — SULFAMETHOXAZOLE-TRIMETHOPRIM 800-160 MG PO TABS
1.0000 | ORAL_TABLET | Freq: Two times a day (BID) | ORAL | Status: DC
  Administered 2024-07-14: 1 via ORAL

## 2024-07-14 NOTE — Patient Instructions (Addendum)
 Taking Care of Yourself after Urodynamics, Cystoscopy, Bulkamid Injection, or Botox Injection   Drink plenty of water  for a day or two following your procedure. Try to have about 8 ounces (one cup) at a time, and do this 6 times or more per day unless you have fluid restrictitons AVOID irritative beverages such as coffee, tea, soda, alcoholic or citrus drinks for a day or two, as this may cause burning with urination.  For the first 1-2 days after the procedure, your urine may be pink or red in color. You may have some blood in your urine as a normal side effect of the procedure. Large amounts of bleeding or difficulty urinating are NOT normal. Call the nurse line if this happens or go to the nearest Emergency Room if the bleeding is heavy or you cannot urinate at all and it is after hours. If you had a Bulkamid injection in the urethra and need to be catheterized, ask for a pediatric catheter to be used (size 10 or 12-French) so the material is not pushed out of place.   You may experience some discomfort or a burning sensation with urination after having this procedure. You can use over the counter Azo or pyridium  to help with burning and follow the instructions on the packaging. If it does not improve within 1-2 days, or other symptoms appear (fever, chills, or difficulty urinating) call the office to speak to a nurse.  You may return to normal daily activities such as work, school, driving, exercising and housework on the day of the procedure. If your doctor gave you a prescription, take it as ordered.   Please consider to stop smoking.   Women should try to eat at least 21 to 25 grams of fiber a day, while men should aim for 30 to 38 grams a day. You can add fiber to your diet with food or a fiber supplement such as psyllium (metamucil), benefiber, or fibercon.   Here's a look at how much dietary fiber is found in some common foods. When buying packaged foods, check the Nutrition Facts label for  fiber content. It can vary among brands.  Fruits Serving size Total fiber (grams)*  Raspberries 1 cup 8.0  Pear 1 medium 5.5  Apple, with skin 1 medium 4.5  Banana 1 medium 3.0  Orange 1 medium 3.0  Strawberries 1 cup 3.0   Vegetables Serving size Total fiber (grams)*  Green peas, boiled 1 cup 9.0  Broccoli, boiled 1 cup chopped 5.0  Turnip greens, boiled 1 cup 5.0  Brussels sprouts, boiled 1 cup 4.0  Potato, with skin, baked 1 medium 4.0  Sweet corn, boiled 1 cup 3.5  Cauliflower, raw 1 cup chopped 2.0  Carrot, raw 1 medium 1.5   Grains Serving size Total fiber (grams)*  Spaghetti, whole-wheat, cooked 1 cup 6.0  Barley, pearled, cooked 1 cup 6.0  Bran flakes 3/4 cup 5.5  Quinoa, cooked 1 cup 5.0  Oat bran muffin 1 medium 5.0  Oatmeal, instant, cooked 1 cup 5.0  Popcorn, air-popped 3 cups 3.5  Brown rice, cooked 1 cup 3.5  Bread, whole-wheat 1 slice 2.0  Bread, rye 1 slice 2.0   Legumes, nuts and seeds Serving size Total fiber (grams)*  Split peas, boiled 1 cup 16.0  Lentils, boiled 1 cup 15.5  Black beans, boiled 1 cup 15.0  Baked beans, canned 1 cup 10.0  Chia seeds 1 ounce 10.0  Almonds 1 ounce (23 nuts) 3.5  Pistachios 1 ounce (49  nuts) 3.0  Sunflower kernels 1 ounce 3.0  *Rounded to nearest 0.5 gram. Source: [employer] for Standard Reference, Legacy Release    Limit fluid intake to around 64oz and 1 serving of caffeine/day

## 2024-07-14 NOTE — Progress Notes (Signed)
 Bulkamid Injection  CC: 58 y.o. y.o. F with stress dominant mixed urinary incontinence who presents for transurethral Bulkamid injection.  Uses 0.5-1 cigarettes/day.  Patient signed her consent form.  She was given Bactrim  antibiotic prophylaxis today.  Today's Vitals   07/14/24 0808  BP: (!) 155/99  Pulse: 94    Results for orders placed or performed in visit on 07/14/24 (from the past 24 hours)  POCT URINALYSIS DIP (CLINITEK)     Status: Abnormal   Collection Time: 07/14/24  9:23 AM  Result Value Ref Range   Color, UA yellow yellow   Clarity, UA clear clear   Glucose, UA negative negative mg/dL   Bilirubin, UA negative negative   Ketones, POC UA trace (5) (A) negative mg/dL   Spec Grav, UA 8.979 8.989 - 1.025   Blood, UA negative negative   pH, UA 6.0 5.0 - 8.0   POC PROTEIN,UA negative negative, trace   Urobilinogen, UA 1.0 0.2 or 1.0 E.U./dL   Nitrite, UA Negative Negative   Leukocytes, UA Negative Negative    Procedure: Time out was performed. The bladder was catheterized and 10 ml of 2% lidocaine  jelly placed in the urethra. A urethral block was performed by injecting 3ml of 1% lidocaine  with epinephrine  at 3 and 9 o'clock adjacent to the urethra.  The needle was primed.  The cystoscope was inserted to the level of the bladder neck.  The needle was inserted 2 cm and the scope was pulled back into the urethra 2 cm.  The needle was inserted bevel up at the 5 o'clock position and the Bulkamid was injected to obtain coaptation.  This was repeated at the 2 o'clock,  10 o'clock and 7 o'clock positions.   A total of 2- 1ml syringes were used and good circumferential coaptation was noted.  The patient tolerated the procedure well. She was asked to void after the procedure.  Post-Void Residual (PVR) by Bladder Scan: In order to evaluate bladder emptying, we discussed obtaining a postvoid residual and she agreed to this procedure.  Procedure: The ultrasound unit was placed on the  patient's abdomen in the suprapubic region after the patient had voided.    Post Void Residual - 07/14/24 0922       Post Void Residual   Post Void Residual 65 mL           ASSESSMENT: 58 y.o. y.o. s/p transurethral Bulkamid injection for stress incontinence.   PLAN: Patient will follow up in 4 weeks to reassess. Voiding and post-procedure precautions were given. She will return for heavy bleeding, fevers, dysuria lasting beyond today and incomplete emptying.  - encouraged optimization of bowel movements with fiber supplementation - discussed fluid management and caffeine reduction. - encouraged tobacco cessation  All questions were answered.  Lianne ONEIDA Gillis, MD

## 2024-07-18 ENCOUNTER — Ambulatory Visit: Admitting: Obstetrics

## 2024-07-21 ENCOUNTER — Ambulatory Visit: Admitting: Obstetrics

## 2024-07-21 NOTE — Progress Notes (Deleted)
 [facility] Return Visit  SUBJECTIVE  History of Present Illness: [name] is a 58 y.o. female seen in follow-up for ***. Plan at last visit was ***.   Lasix   Past Medical History: Patient  has a past medical history of Anemia, COVID-19 virus infection (11/2020), Fibroids, Heart palpitations, History of blood transfusion, History of colon polyps, Hypertension, Hypertension, Knee joint injury, Morbid obesity (HCC), Palpitations, Pneumonia, Thyroid  goiter, and Tobacco abuse.   Past Surgical History: She  has a past surgical history that includes Cesarean section; Hand surgery (Bilateral); Ankle surgery (Right, 2009); Tubal ligation; Replacement total knee (Right, 2012); arthroscopic surgery to knee (Bilateral); Total knee arthroplasty (Left, 01/03/2014); Joint replacement (Bilateral); Colonoscopy; Shoulder open rotator cuff repair (Right, 10/12/2019); Breast biopsy (Left, 03/24/2019); RIGHT/LEFT HEART CATH AND CORONARY ANGIOGRAPHY (N/A, 03/05/2023); ABDOMINAL AORTOGRAM (N/A, 03/05/2023); TEE without cardioversion (N/A, 03/05/2023); Bubble study (03/05/2023); and Colonoscopy (N/A, 06/16/2024).   Medications: She has a current medication list which includes the following prescription(s): furosemide , lisinopril , methocarbamol , metoprolol  succinate, terbinafine , and topiramate , and the following Facility-Administered Medications: sulfamethoxazole -trimethoprim .   Allergies: Patient is allergic to tramadol , ibuprofen , metronidazole , morphine , and penicillins.   Social History: Patient  reports that she has been smoking cigarettes. She has a 4 pack-year smoking history. She has never used smokeless tobacco. She reports current alcohol use of about 2.0 standard drinks of alcohol per week. She reports that she does not use drugs.     OBJECTIVE     Physical Exam: There were no vitals filed for this visit. Gen: No apparent distress, A&O x 3.  Detailed Urogynecologic  Evaluation:  Deferred. Prior exam showed:      No data to display             ASSESSMENT AND PLAN    [name] is a 58 y.o. with:  No diagnosis found.  There are no diagnoses linked to this encounter.   Janet ONEIDA Gillis, MD

## 2024-07-29 ENCOUNTER — Ambulatory Visit: Admitting: Obstetrics and Gynecology

## 2024-08-02 ENCOUNTER — Ambulatory Visit: Admitting: Obstetrics and Gynecology
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
## 2024-08-11 ENCOUNTER — Other Ambulatory Visit (HOSPITAL_COMMUNITY)
Admission: RE | Admit: 2024-08-11 | Discharge: 2024-08-11 | Disposition: A | Discharge status: Home / Self Care | Source: Ambulatory Visit | Attending: Obstetrics and Gynecology | Admitting: Obstetrics and Gynecology

## 2024-08-11 ENCOUNTER — Ambulatory Visit: Admitting: Obstetrics and Gynecology

## 2024-08-11 ENCOUNTER — Encounter: Payer: Self-pay | Admitting: Obstetrics and Gynecology

## 2024-08-11 VITALS — BP 137/94 | HR 90

## 2024-08-11 DIAGNOSIS — R82998 Other abnormal findings in urine: Secondary | ICD-10-CM | POA: Insufficient documentation

## 2024-08-11 DIAGNOSIS — N3941 Urge incontinence: Secondary | ICD-10-CM | POA: Diagnosis not present

## 2024-08-11 DIAGNOSIS — R350 Frequency of micturition: Secondary | ICD-10-CM | POA: Diagnosis not present

## 2024-08-11 DIAGNOSIS — M792 Neuralgia and neuritis, unspecified: Secondary | ICD-10-CM | POA: Diagnosis not present

## 2024-08-11 LAB — POCT URINALYSIS DIP (CLINITEK)
Bilirubin, UA: NEGATIVE
Blood, UA: NEGATIVE
Glucose, UA: NEGATIVE mg/dL
Ketones, POC UA: NEGATIVE mg/dL
Nitrite, UA: NEGATIVE
POC PROTEIN,UA: NEGATIVE
Spec Grav, UA: 1.02 (ref 1.010–1.025)
Urobilinogen, UA: 0.2 U/dL
pH, UA: 5.5 (ref 5.0–8.0)

## 2024-08-11 MED ORDER — VIBEGRON 75 MG PO TABS: 75.0000 mg | ORAL_TABLET | Freq: Every day | ORAL | 3 refills | Status: AC

## 2024-08-11 NOTE — Patient Instructions (Signed)
Today we talked about ways to manage bladder urgency such as altering your diet to avoid irritative beverages and foods (bladder diet) as well as attempting to decrease stress and other exacerbating factors.  You can also chew a plain Tums 1-3 times per day to make your urine less acidic, especially if you have eating/drinking acidic things.   There is a website with helpful information for people with bladder irritation, called the IC Network at https://www.ic-network.com. This website has more information about a healthy bladder diet and patient forums for support.  The Most Bothersome Foods* The Least Bothersome Foods*  Coffee - Regular & Decaf Tea - caffeinated Carbonated beverages - cola, non-colas, diet & caffeine-free Alcohols - Beer, Red Wine, White Wine, Champagne Fruits - Grapefruit, Lemon, Orange, Pineapple Fruit Juices - Cranberry, Grapefruit, Orange, Pineapple Vegetables - Tomato & Tomato Products Flavor Enhancers - Hot peppers, Spicy foods, Chili, Horseradish, Vinegar, Monosodium glutamate (MSG) Artificial Sweeteners - NutraSweet, Sweet 'N Low, Equal (sweetener), Saccharin Ethnic foods - Mexican, Thai, Indian food Water Milk - low-fat & whole Fruits - Bananas, Blueberries, Honeydew melon, Pears, Raisins, Watermelon Vegetables - Broccoli, Brussels Sprouts, Cabbage, Carrots, Cauliflower, Celery, Cucumber, Mushrooms, Peas, Radishes, Squash, Zucchini, White potatoes, Sweet potatoes & yams Poultry - Chicken, Eggs, Turkey, Meat - Beef, Pork, Lamb Seafood - Shrimp, Tuna fish, Salmon Grains - Oat, Rice Snacks - Pretzels, Popcorn  *Friedlander J. et al. Diet and its role in interstitial cystitis/bladder pain syndrome (IC/BPS) and comorbid conditions. [employer]. [employer]. 2012 Jan 11.    

## 2024-08-11 NOTE — Progress Notes (Signed)
 [facility] Return Visit  SUBJECTIVE  History of Present Illness: [name] is a 58 y.o. female seen in follow-up for OAB and SUI. Plan at last visit was pursue urethral bulking. She had previously been on Gemtesa  75mg  and was doing well, reported a 55% improvement, and then stopped after the bulking.    Patient reports increased urgency and frequency. Also endorses abdominal pain.    Past Medical History: Patient  has a past medical history of Anemia, COVID-19 virus infection (11/2020), Fibroids, Heart palpitations, History of blood transfusion, History of colon polyps, Hypertension, Hypertension, Knee joint injury, Morbid obesity (HCC), Palpitations, Pneumonia, Thyroid  goiter, and Tobacco abuse.   Past Surgical History: She  has a past surgical history that includes Cesarean section; Hand surgery (Bilateral); Ankle surgery (Right, 2009); Tubal ligation; Replacement total knee (Right, 2012); arthroscopic surgery to knee (Bilateral); Total knee arthroplasty (Left, 01/03/2014); Joint replacement (Bilateral); Colonoscopy; Shoulder open rotator cuff repair (Right, 10/12/2019); Breast biopsy (Left, 03/24/2019); RIGHT/LEFT HEART CATH AND CORONARY ANGIOGRAPHY (N/A, 03/05/2023); ABDOMINAL AORTOGRAM (N/A, 03/05/2023); TEE without cardioversion (N/A, 03/05/2023); Bubble study (03/05/2023); and Colonoscopy (N/A, 06/16/2024).   Medications: She has a current medication list which includes the following prescription(s): furosemide , lisinopril , methocarbamol , metoprolol  succinate, terbinafine , topiramate , and vibegron , and the following Facility-Administered Medications: sulfamethoxazole -trimethoprim .   Allergies: Patient is allergic to tramadol , ibuprofen , metronidazole , morphine , and penicillins.   Social History: Patient  reports that she has been smoking cigarettes. She has a 4 pack-year smoking history. She has never used smokeless tobacco. She reports current alcohol use of about  2.0 standard drinks of alcohol per week. She reports that she does not use drugs.     OBJECTIVE     Post Void Residual - 08/11/24 1540       Post Void Residual   Post Void Residual 118 mL         Lab Results  Component Value Date   COLORU yellow 08/11/2024   CLARITYU clear 08/11/2024   GLUCOSEUR negative 08/11/2024   BILIRUBINUR negative 08/11/2024   KETONESU [place] 05/11/2024   SPECGRAV 1.020 08/11/2024   RBCUR negative 08/11/2024   PHUR 5.5 08/11/2024   PROTEINUR Negative 05/11/2024   UROBILINOGEN 0.2 08/11/2024   LEUKOCYTESUR Trace (A) 08/11/2024     Physical Exam: Vitals:   08/11/24 1500  BP: (!) 137/94  Pulse: 90   Gen: No apparent distress, A&O x 3.  Detailed Urogynecologic Evaluation:  Deferred.    ASSESSMENT AND PLAN    [name] is a 58 y.o. with:  1. Urge incontinence   2. Urinary frequency   3. Urine leukocytes    Patient's OAB did not improve with bulking. She is not taking the Gemtesa  any longer. Will re-start the Gemtesa  75mg  daily as she had good improvement with this.  We discussed that tertiary therapy includes the bladder botox and SNM. She is not currently interested in either and will try the medication.  Will send urine for culture due to recent procedure. If UTI present will treat.   Patient to return in 6-8 weeks for follow up with Dr. Guadlupe if she needs tertiary treatment discussion.    Janet Betty G Haly Feher, NP

## 2024-08-12 DIAGNOSIS — Z471 Aftercare following joint replacement surgery: Secondary | ICD-10-CM | POA: Diagnosis not present

## 2024-08-12 DIAGNOSIS — Z96653 Presence of artificial knee joint, bilateral: Secondary | ICD-10-CM | POA: Diagnosis not present

## 2024-08-12 LAB — URINE CULTURE: Culture: NO GROWTH

## 2024-08-15 ENCOUNTER — Ambulatory Visit: Admitting: Student

## 2024-08-24 ENCOUNTER — Ambulatory Visit: Attending: Cardiovascular Disease | Admitting: Cardiovascular Disease

## 2024-08-24 NOTE — Progress Notes (Deleted)
 No chief complaint on file.  History of Present Illness: 58 yo female with history of anemia, HTN, obesity and tobacco abuse who is here today for follow up. I saw her as a new patient for the evaluation of palpitations in December 2022. She was seen in the [facility] 11/13/21 with palpitations and chest tightness that occurred after climbing stairs. She was restarted on atenolol  but she had not started this yet when I saw her.  She had recurrent palpitations the day before her visit here that lasted for several seconds. She has had some dyspnea but no chest pain. Echo January 2023 with low normal LV systolic function, LVEF=50%. No regional wall motion abnormalities. Moderate mitral regurgitation. Cardiac monitor with sinus, 2 beat runs of VT and 9 runs of SVT, longest 16 seconds. Echo 01/29/23 with LVEF around 45-50% and moderate to severe MR. TEE March 2024 with moderate MR with LVEF=55-60%. Right and left heart catheterization 03/05/23 with mildly elevated LVEDP, mild pulmonary HTN and no evidence of CAD. Echo June 2025 with LVEF=50-55% with global hypokinesis. Moderate mitral regurgitation. Moderate enlargement left atrium. She missed a follow up appt in our office in July 2025.   She is here today for follow up. The patient denies any chest pain, dyspnea, palpitations, lower extremity edema, orthopnea, PND, dizziness, near syncope or syncope.    Primary Care Physician: [name], DO  Past Medical History:  Diagnosis Date   Anemia    COVID-19 virus infection 11/2020   Fibroids    Heart palpitations    History of    History of blood transfusion    with knee replacement   History of colon polyps    Hypertension    Hypertension    no current meds for htn   Knee joint injury    Morbid obesity (HCC)    Palpitations    Pneumonia    remote history   Thyroid  goiter    bilateral goiter and cyst follow up every 2 years Dr. Arlana   Tobacco abuse     Past Surgical History:   Procedure Laterality Date   ABDOMINAL AORTOGRAM N/A 03/05/2023   Procedure: ABDOMINAL AORTOGRAM;  Surgeon: Dann Candyce RAMAN, MD;  Location: [facility] INVASIVE CV LAB;  Service: Cardiovascular;  Laterality: N/A;   ANKLE SURGERY Right 2009   broken ankle    arthroscopic surgery to knee Bilateral    BREAST BIOPSY Left 03/24/2019   BUBBLE STUDY  03/05/2023   Procedure: BUBBLE STUDY;  Surgeon: Barbaraann Darryle Ned, MD;  Location: [facility] ENDOSCOPY;  Service: Cardiovascular;;   CESAREAN SECTION     COLONOSCOPY     COLONOSCOPY N/A 06/16/2024   Procedure: COLONOSCOPY;  Surgeon: Legrand Victory LITTIE DOUGLAS, MD;  Location: [facility];  Service: Gastroenterology;  Laterality: N/A;   HAND SURGERY Bilateral    JOINT REPLACEMENT Bilateral    knees   REPLACEMENT TOTAL KNEE Right 2012   RIGHT/LEFT HEART CATH AND CORONARY ANGIOGRAPHY N/A 03/05/2023   Procedure: RIGHT/LEFT HEART CATH AND CORONARY ANGIOGRAPHY;  Surgeon: Dann Candyce RAMAN, MD;  Location: [facility] INVASIVE CV LAB;  Service: Cardiovascular;  Laterality: N/A;   SHOULDER OPEN ROTATOR CUFF REPAIR Right 10/12/2019   Procedure: Right shoulder rotator cuff repair with graft and anchors;  Surgeon: Heide Ingle, MD;  Location: [facility];  Service: Orthopedics;  Laterality: Right;    TEE WITHOUT CARDIOVERSION N/A 03/05/2023   Procedure: TRANSESOPHAGEAL ECHOCARDIOGRAM (TEE);  Surgeon: Barbaraann Darryle Ned, MD;  Location: [facility]OSCOPY;  Service:  Cardiovascular;  Laterality: N/A;   TOTAL KNEE ARTHROPLASTY Left 01/03/2014   Procedure: LEFT TOTAL KNEE ARTHROPLASTY;  Surgeon: Tanda DELENA Heading, MD;  Location: [facility];  Service: Orthopedics;  Laterality: Left;   TUBAL LIGATION      Current Outpatient Medications  Medication Sig Dispense Refill   furosemide  (LASIX ) 40 MG tablet Take 1 tablet (40 mg total) by mouth daily. 90 tablet 0   lisinopril  (ZESTRIL ) 20 MG tablet Take 2 tablets (40 mg total) by mouth daily. 90 tablet 3   methocarbamol  (ROBAXIN ) 500 MG tablet Take 500  mg 3 times a day by oral route as needed for 10 days.     metoprolol  succinate (TOPROL -XL) 25 MG 24 hr tablet TAKE 1 TABLET(25 MG) BY MOUTH DAILY. MAY TAKE 1 EXTRA TABLET AS NEEDED FOR HEART RACING 180 tablet 3   topiramate  (TOPAMAX ) 25 MG tablet Take 2 tablets (50 mg total) by mouth at bedtime. 60 tablet 5   Vibegron  75 MG TABS Take 1 tablet (75 mg total) by mouth daily. 30 tablet 3   Current Facility-Administered Medications  Medication Dose Route Frequency Provider Last Rate Last Admin   sulfamethoxazole -trimethoprim  (BACTRIM  DS) 800-160 MG per tablet 1 tablet  1 tablet Oral Q12H Guadlupe Dull T, MD   1 tablet at 07/14/24 0930    Allergies  Allergen Reactions   Tramadol  Itching   Ibuprofen  Nausea And Vomiting   Metronidazole  Itching and Nausea And Vomiting   Morphine     Penicillins Itching    Denies hives, or airway issues. Pt sates this is not a true allergy 07-14-2024    Social History   Socioeconomic History   Marital status: Single    Spouse name: Not on file   Number of children: 2   Years of education: Not on file   Highest education level: Not on file  Occupational History   Occupation: disabled    Employer: DISABLED    Occupation: disable  Tobacco Use   Smoking status: Every Day    Current packs/day: 0.20    Average packs/day: 0.2 packs/day for 20.0 years (4.0 ttl pk-yrs)    Types: Cigarettes   Smokeless tobacco: Never   Tobacco comments:    1-2  cigs per day/ a pack in 2 weeks 10/18- 23cigs a day-11/11/23  Vaping Use   Vaping status: Never Used  Substance and Sexual Activity   Alcohol use: Yes    Alcohol/week: 2.0 standard drinks of alcohol    Types: 2 Standard drinks or equivalent per week    Comment: occasionally once per month   Drug use: No   Sexual activity: Not Currently    Birth control/protection: Surgical  Other Topics Concern   Not on file  Social History Narrative   ** Merged History Encounter ** Applying for disability, previously worked in  Musician   Right handed   Caffeine rarely   Live in two story           What is your current occupation? disability   Do you live at home alone?   Who lives with you? son   What type of home do you live in: 1 story or 2 story? Two story appartment          Social Drivers of Health   Financial Resource Strain: Low Risk  (09/02/2023)   Overall Financial Resource Strain (CARDIA)    Difficulty of Paying Living Expenses: Not hard at all  Food Insecurity: Low Risk  (03/29/2024)   Received  from [facility]   Hunger Vital Sign    Within the past 12 months, you worried that your food would run out before you got money to buy more: Never true    Within the past 12 months, the food you bought just didn't last and you didn't have money to get more. : Never true  Transportation Needs: No Transportation Needs (03/29/2024)   Received from [facility]   Transportation    In the past 12 months, has lack of reliable transportation kept you from medical appointments, meetings, work or from getting things needed for daily living? : No  Physical Activity: Insufficiently Active (09/02/2023)   Exercise Vital Sign    Days of Exercise per Week: 2 days    Minutes of Exercise per Session: 60 min  Stress: Stress Concern Present (09/02/2023)   [employer] of Occupational Health - Occupational Stress Questionnaire    Feeling of Stress : To some extent  Social Connections: Socially Isolated (09/02/2023)   Social Connection and Isolation Panel    Frequency of Communication with Friends and Family: Never    Frequency of Social Gatherings with Friends and Family: Never    Attends Religious Services: More than 4 times per year    Active Member of [employer] or Organizations: No    Attends Banker Meetings: Never    Marital Status: Never married  Intimate Partner Violence: Not At Risk (09/02/2023)   Humiliation, Afraid, Rape, and Kick questionnaire    Fear of Current or Ex-Partner: No     Emotionally Abused: No    Physically Abused: No    Sexually Abused: No    Family History  Problem Relation Age of Onset   Hypertension Mother    Dementia Mother    Hypertension Father    Atrial fibrillation Father    Cancer Maternal Grandmother        ? lung   Colon cancer Neg Hx    Stomach cancer Neg Hx    Esophageal cancer Neg Hx     Review of Systems:  As stated in the HPI and otherwise negative.   LMP 06/07/2016   Physical Examination:  General: Well developed, well nourished, NAD  HEENT: OP clear, mucus membranes moist  SKIN: warm, dry. No rashes. Neuro: No focal deficits  Musculoskeletal: Muscle strength 5/5 all ext  Psychiatric: Mood and affect normal  Neck: No JVD, no carotid bruits, no thyromegaly, no lymphadenopathy.  Lungs:Clear bilaterally, no wheezes, rhonci, crackles Cardiovascular: Regular rate and rhythm. *** Systolic murmur.  Abdomen:Soft. Bowel sounds present. Non-tender.  Extremities: No lower extremity edema. Pulses are 2 + in the bilateral DP/PT.  EKG:  EKG is *** ordered today. The ekg ordered today demonstrates   Recent Labs: 02/08/2024: ALT 9; BUN 18; Creatinine, Ser 1.06; Hemoglobin 12.9; Platelets 180.0; Potassium 4.3; Sodium 138   Lipid Panel    Component Value Date/Time   CHOL 113 10/18/2009 2100   TRIG 36 10/18/2009 2100   HDL 54 10/18/2009 2100   CHOLHDL 2.1 Ratio 10/18/2009 2100   VLDL 7 10/18/2009 2100   LDLCALC 52 10/18/2009 2100     Wt Readings from Last 3 Encounters:  06/20/24 260 lb 3.2 oz (118 kg)  06/16/24 262 lb (118.8 kg)  05/20/24 263 lb (119.3 kg)    Assessment and Plan:  *** plan from here *** 1. Palpitations/SVT: She has rare palpitations. Continue Toprol .   2. Mitral regurgitation: Moderate to severe MR by TEE March 2024 and stable by  TTE August 2024. Will repeat echo now.    3. Non-ischemic cardiomyopathyChronic systolic CHF: LVEF normal by echo August 2024. Continue Toprol  and Lisinopril . Wt is stable on  Lasix .    4. HTN: BP is elevated. Increase Lisinopril  to 10 mg daily and remove Losartan  from her list.  Continue current therapy    Labs/ tests ordered today include:  No orders of the defined types were placed in this encounter.  Disposition:   F/U with me one year  Signed, Lonni Cash, MD 08/24/2024 12:11 PM    [facility] [address] [place], [place], [place]  [address] Phone: [phone]; Fax: [phone]

## 2024-08-27 ENCOUNTER — Other Ambulatory Visit: Payer: Self-pay

## 2024-08-27 ENCOUNTER — Encounter (HOSPITAL_COMMUNITY): Payer: Self-pay | Admitting: *Deleted

## 2024-08-27 ENCOUNTER — Emergency Department (HOSPITAL_COMMUNITY)
Admission: EM | Admit: 2024-08-27 | Discharge: 2024-08-27 | Disposition: A | Discharge status: Home / Self Care | Attending: Emergency Medicine | Admitting: Emergency Medicine

## 2024-08-27 ENCOUNTER — Emergency Department (HOSPITAL_COMMUNITY)

## 2024-08-27 DIAGNOSIS — F17200 Nicotine dependence, unspecified, uncomplicated: Secondary | ICD-10-CM | POA: Insufficient documentation

## 2024-08-27 DIAGNOSIS — R051 Acute cough: Secondary | ICD-10-CM

## 2024-08-27 DIAGNOSIS — J069 Acute upper respiratory infection, unspecified: Secondary | ICD-10-CM | POA: Insufficient documentation

## 2024-08-27 DIAGNOSIS — J029 Acute pharyngitis, unspecified: Secondary | ICD-10-CM | POA: Diagnosis not present

## 2024-08-27 DIAGNOSIS — I10 Essential (primary) hypertension: Secondary | ICD-10-CM | POA: Diagnosis not present

## 2024-08-27 DIAGNOSIS — Z79899 Other long term (current) drug therapy: Secondary | ICD-10-CM | POA: Diagnosis not present

## 2024-08-27 DIAGNOSIS — R059 Cough, unspecified: Secondary | ICD-10-CM | POA: Diagnosis not present

## 2024-08-27 DIAGNOSIS — R030 Elevated blood-pressure reading, without diagnosis of hypertension: Secondary | ICD-10-CM

## 2024-08-27 DIAGNOSIS — R0602 Shortness of breath: Secondary | ICD-10-CM | POA: Diagnosis not present

## 2024-08-27 LAB — I-STAT CHEM 8, ED
BUN: 13 mg/dL (ref 6–20)
Calcium, Ion: 1.15 mmol/L (ref 1.15–1.40)
Chloride: 106 mmol/L (ref 98–111)
Creatinine, Ser: 1.2 mg/dL — ABNORMAL HIGH (ref 0.44–1.00)
Glucose, Bld: 98 mg/dL (ref 70–99)
HCT: 40 % (ref 36.0–46.0)
Hemoglobin: 13.6 g/dL (ref 12.0–15.0)
Potassium: 4.5 mmol/L (ref 3.5–5.1)
Sodium: 139 mmol/L (ref 135–145)
TCO2: 24 mmol/L (ref 22–32)

## 2024-08-27 LAB — CBC WITH DIFFERENTIAL/PLATELET
Abs Immature Granulocytes: 0.01 K/uL (ref 0.00–0.07)
Basophils Absolute: 0 K/uL (ref 0.0–0.1)
Basophils Relative: 1 %
Eosinophils Absolute: 0.2 K/uL (ref 0.0–0.5)
Eosinophils Relative: 5 %
HCT: 40.6 % (ref 36.0–46.0)
Hemoglobin: 12.9 g/dL (ref 12.0–15.0)
Immature Granulocytes: 0 %
Lymphocytes Relative: 29 %
Lymphs Abs: 1.2 K/uL (ref 0.7–4.0)
MCH: 32.4 pg (ref 26.0–34.0)
MCHC: 31.8 g/dL (ref 30.0–36.0)
MCV: 102 fL — ABNORMAL HIGH (ref 80.0–100.0)
Monocytes Absolute: 0.3 K/uL (ref 0.1–1.0)
Monocytes Relative: 8 %
Neutro Abs: 2.3 K/uL (ref 1.7–7.7)
Neutrophils Relative %: 57 %
Platelets: 221 K/uL (ref 150–400)
RBC: 3.98 MIL/uL (ref 3.87–5.11)
RDW: 15.1 % (ref 11.5–15.5)
WBC: 4 K/uL (ref 4.0–10.5)
nRBC: 0 % (ref 0.0–0.2)

## 2024-08-27 LAB — RESP PANEL BY RT-PCR (RSV, FLU A&B, COVID)  RVPGX2
Influenza A by PCR: NEGATIVE
Influenza B by PCR: NEGATIVE
Resp Syncytial Virus by PCR: NEGATIVE
SARS Coronavirus 2 by RT PCR: NEGATIVE

## 2024-08-27 LAB — GROUP A STREP BY PCR: Group A Strep by PCR: NOT DETECTED

## 2024-08-27 MED ORDER — IPRATROPIUM-ALBUTEROL 0.5-2.5 (3) MG/3ML IN SOLN
3.0000 mL | Freq: Once | RESPIRATORY_TRACT | Status: AC
  Administered 2024-08-27: 3 mL via RESPIRATORY_TRACT
  Filled 2024-08-27: qty 3

## 2024-08-27 MED ORDER — DOXYCYCLINE HYCLATE 100 MG PO TABS
100.0000 mg | ORAL_TABLET | Freq: Once | ORAL | Status: AC
  Administered 2024-08-27: 100 mg via ORAL
  Filled 2024-08-27: qty 1

## 2024-08-27 MED ORDER — LISINOPRIL 20 MG PO TABS
20.0000 mg | ORAL_TABLET | Freq: Once | ORAL | Status: AC
  Administered 2024-08-27: 20 mg via ORAL
  Filled 2024-08-27: qty 1

## 2024-08-27 MED ORDER — GUAIFENESIN-DM 100-10 MG/5ML PO SYRP
10.0000 mL | ORAL_SOLUTION | Freq: Once | ORAL | Status: AC
  Administered 2024-08-27: 10 mL via ORAL
  Filled 2024-08-27: qty 10

## 2024-08-27 MED ORDER — ALBUTEROL SULFATE HFA 108 (90 BASE) MCG/ACT IN AERS: 1.0000 | INHALATION_SPRAY | Freq: Four times a day (QID) | RESPIRATORY_TRACT | 0 refills | Status: AC | PRN

## 2024-08-27 MED ORDER — METOPROLOL SUCCINATE ER 25 MG PO TB24
25.0000 mg | ORAL_TABLET | Freq: Every day | ORAL | Status: DC
  Administered 2024-08-27: 25 mg via ORAL
  Filled 2024-08-27: qty 1

## 2024-08-27 MED ORDER — ONDANSETRON HCL 4 MG PO TABS: 4.0000 mg | ORAL_TABLET | Freq: Four times a day (QID) | ORAL | 0 refills | Status: AC

## 2024-08-27 MED ORDER — DEXAMETHASONE 1 MG/ML PO CONC
10.0000 mg | Freq: Once | ORAL | Status: AC
  Administered 2024-08-27: 10 mg via ORAL
  Filled 2024-08-27: qty 10

## 2024-08-27 MED ORDER — DOXYCYCLINE HYCLATE 100 MG PO CAPS: 100.0000 mg | ORAL_CAPSULE | Freq: Two times a day (BID) | ORAL | 0 refills | Status: AC

## 2024-08-27 MED ORDER — ONDANSETRON 4 MG PO TBDP
4.0000 mg | ORAL_TABLET | Freq: Once | ORAL | Status: AC
  Administered 2024-08-27: 4 mg via ORAL
  Filled 2024-08-27: qty 1

## 2024-08-27 NOTE — ED Provider Notes (Signed)
 [place] [facility] Provider Note   CSN: [id number] Arrival date & time: 08/27/24  9158     Patient presents with: Cough, Shortness of Breath, and Sore Throat   [name] is a 58 y.o. female.   Patient is a 58 year old female with past medical history of hypertension, anemia, heart palpitations, and current tobacco use presenting for complaints of shortness of breath.  Patient admits to a cough that started over the last 24 hours, shortness of breath associated with it, and a sore throat.  [name] denies any fevers or chills.  Denies any recent antibiotic use.  Denies any nausea, vomiting, abdominal pain.  The history is provided by the patient. No language interpreter was used.  Cough Associated symptoms: shortness of breath   Associated symptoms: no chest pain, no chills, no ear pain, no fever, no rash and no sore throat   Shortness of Breath Associated symptoms: cough   Associated symptoms: no abdominal pain, no chest pain, no ear pain, no fever, no rash, no sore throat and no vomiting   Sore Throat Associated symptoms include shortness of breath. Pertinent negatives include no chest pain and no abdominal pain.       Prior to Admission medications   Medication Sig Start Date End Date Taking? Authorizing Provider  albuterol  (VENTOLIN  HFA) 108 (90 Base) MCG/ACT inhaler Inhale 1-2 puffs into the lungs every 6 (six) hours as needed for wheezing or shortness of breath. 08/27/24  Yes Elnor Hila P, DO  doxycycline  (VIBRAMYCIN ) 100 MG capsule Take 1 capsule (100 mg total) by mouth 2 (two) times daily for 7 days. 08/27/24 09/03/24 Yes Elnor Hila P, DO  ondansetron  (ZOFRAN ) 4 MG tablet Take 1 tablet (4 mg total) by mouth every 6 (six) hours. 08/27/24  Yes Elnor Hila P, DO  furosemide  (LASIX ) 40 MG tablet Take 1 tablet (40 mg total) by mouth daily. 03/10/24   Verlin Lonni BIRCH, MD  lisinopril  (ZESTRIL ) 20 MG tablet Take 2 tablets (40 mg  total) by mouth daily. 06/20/24 06/20/25  Nooruddin, Saad, MD  methocarbamol  (ROBAXIN ) 500 MG tablet Take 500 mg 3 times a day by oral route as needed for 10 days. 07/28/23   [provider]  metoprolol  succinate (TOPROL -XL) 25 MG 24 hr tablet TAKE 1 TABLET(25 MG) BY MOUTH DAILY. MAY TAKE 1 EXTRA TABLET AS NEEDED FOR HEART RACING 03/31/23   Verlin Lonni BIRCH, MD  topiramate  (TOPAMAX ) 25 MG tablet Take 2 tablets (50 mg total) by mouth at bedtime. 11/11/23   Leigh Venetia CROME, MD  Vibegron  75 MG TABS Take 1 tablet (75 mg total) by mouth daily. 08/11/24   Zuleta, Kaitlin G, NP    Allergies: Tramadol , Ibuprofen , Metronidazole , Morphine , and Penicillins    Review of Systems  Constitutional:  Negative for chills and fever.  HENT:  Negative for ear pain and sore throat.   Eyes:  Negative for pain and visual disturbance.  Respiratory:  Positive for cough and shortness of breath.   Cardiovascular:  Negative for chest pain and palpitations.  Gastrointestinal:  Negative for abdominal pain and vomiting.  Genitourinary:  Negative for dysuria and hematuria.  Musculoskeletal:  Negative for arthralgias and back pain.  Skin:  Negative for color change and rash.  Neurological:  Negative for seizures and syncope.  All other systems reviewed and are negative.   Updated Vital Signs BP (!) 181/114   Pulse 97   Temp 97.7 F (36.5 C)   Resp 20  Ht 5' 6 (1.676 m)   Wt 117.9 kg   LMP 06/07/2016   SpO2 98%   BMI 41.97 kg/m   Physical Exam Vitals and nursing note reviewed.  Constitutional:      General: [name] is not in acute distress.    Appearance: [name] is well-developed.  HENT:     Head: Normocephalic and atraumatic.  Eyes:     Conjunctiva/sclera: Conjunctivae normal.  Cardiovascular:     Rate and Rhythm: Normal rate and regular rhythm.     Heart sounds: No murmur heard. Pulmonary:     Effort: Pulmonary effort is normal. Tachypnea present. No respiratory distress.     Breath sounds:  Normal breath sounds. Decreased air movement present.  Abdominal:     Palpations: Abdomen is soft.     Tenderness: There is no abdominal tenderness.  Musculoskeletal:        General: No swelling.     Cervical back: Neck supple.  Skin:    General: Skin is warm and dry.     Capillary Refill: Capillary refill takes less than 2 seconds.  Neurological:     Mental Status: [name] is alert.  Psychiatric:        Mood and Affect: Mood normal.     (all labs ordered are listed, but only abnormal results are displayed) Labs Reviewed  CBC WITH DIFFERENTIAL/PLATELET - Abnormal; Notable for the following components:      Result Value   MCV 102.0 (*)    All other components within normal limits  I-STAT CHEM 8, ED - Abnormal; Notable for the following components:   Creatinine, Ser 1.20 (*)    All other components within normal limits  RESP PANEL BY RT-PCR (RSV, FLU A&B, COVID)  [id number]  GROUP A STREP BY PCR    EKG: None  Radiology: DG Chest 2 View Result Date: 08/27/2024 CLINICAL DATA:  Cough, shortness of breath. EXAM: CHEST - 2 VIEW COMPARISON:  November 23, 2018. FINDINGS: The heart size and mediastinal contours are within normal limits. Both lungs are clear. The visualized skeletal structures are unremarkable. IMPRESSION: No active cardiopulmonary disease. Electronically Signed   By: Lynwood Landy Raddle M.D.   On: 08/27/2024 09:23     Procedures   Medications Ordered in the ED  metoprolol  succinate (TOPROL -XL) 24 hr tablet 25 mg (25 mg Oral Given 08/27/24 1017)  ondansetron  (ZOFRAN -ODT) disintegrating tablet 4 mg (has no administration in time range)  ipratropium-albuterol  (DUONEB) 0.5-2.5 (3) MG/3ML nebulizer solution 3 mL (3 mLs Nebulization Given 08/27/24 1017)  dexamethasone  (DECADRON ) 1 MG/ML solution 10 mg (10 mg Oral Given 08/27/24 1017)  lisinopril  (ZESTRIL ) tablet 20 mg (20 mg Oral Given 08/27/24 1016)  doxycycline  (VIBRA -TABS) tablet 100 mg (100 mg Oral Given 08/27/24 1016)   guaiFENesin -dextromethorphan  (ROBITUSSIN DM) 100-10 MG/5ML syrup 10 mL (10 mLs Oral Given 08/27/24 1016)                                    Medical Decision Making Amount and/or Complexity of Data Reviewed Labs: ordered. Radiology: ordered.  Risk OTC drugs. Prescription drug management.   58 year old female with past medical history of hypertension, anemia, heart palpitations, and current tobacco use presenting for complaints of shortness of breath.  Patient is alert and oriented times, no acute distress, afebrile, with stable vital signs.  [name] is equal bilateral breath sounds.  Minimally diminished breath sounds but no adventitious lung sounds.  Patient is an active smoker.  Will give DuoNebs and Decadron  in case COPD flare.  Patient does state that [name] is been tested for COPD and negative in the past.  Her chest x-ray demonstrates no acute focal consolidations.  Her laboratory studies are stable without leukocytosis or signs or symptoms of sepsis.  My concern is that the patient is developing a viral syndrome secondary to throat pain, coughing, nausea without vomiting.  No signs or symptoms concerning for pulmonary embolism at this time.  [name] is otherwise well-appearing and ready for discharge with close follow-up with her PCP.  Strep negative.  Patient in no distress and overall condition improved here in the ED. Detailed discussions were had with the patient regarding current findings, and need for close f/u with PCP or on call doctor. The patient has been instructed to return immediately if the symptoms worsen in any way for re-evaluation. Patient verbalized understanding and is in agreement with current care plan. All questions answered prior to discharge.      Final diagnoses:  Upper respiratory tract infection, unspecified type  Elevated blood pressure reading  Sore throat  Acute cough    ED Discharge Orders          Ordered    doxycycline  (VIBRAMYCIN ) 100 MG capsule  2  times daily        08/27/24 1115    albuterol  (VENTOLIN  HFA) 108 (90 Base) MCG/ACT inhaler  Every 6 hours PRN        08/27/24 1115    ondansetron  (ZOFRAN ) 4 MG tablet  Every 6 hours        08/27/24 1115               Elnor Bernarda SQUIBB, DO 08/27/24 1115

## 2024-08-27 NOTE — ED Triage Notes (Signed)
 C/o sob and cough with a sore throat states worse today  and at night

## 2024-08-29 ENCOUNTER — Ambulatory Visit

## 2024-08-29 ENCOUNTER — Ambulatory Visit: Admitting: Obstetrics

## 2024-08-29 ENCOUNTER — Other Ambulatory Visit: Payer: Self-pay

## 2024-08-29 VITALS — BP 183/97 | HR 59 | Temp 98.0°F | Wt 263.0 lb

## 2024-08-29 DIAGNOSIS — I10 Essential (primary) hypertension: Secondary | ICD-10-CM

## 2024-08-29 DIAGNOSIS — J069 Acute upper respiratory infection, unspecified: Secondary | ICD-10-CM

## 2024-08-29 MED ORDER — BENZONATATE 100 MG PO CAPS
100.0000 mg | ORAL_CAPSULE | Freq: Three times a day (TID) | ORAL | 0 refills | Status: AC | PRN
  Filled 2024-08-29: qty 20, 7d supply, fill #0

## 2024-08-29 MED ORDER — BENZONATATE 100 MG PO CAPS: 100.0000 mg | ORAL_CAPSULE | Freq: Three times a day (TID) | ORAL | 0 refills | Status: DC | PRN

## 2024-08-29 MED ORDER — IRBESARTAN 300 MG PO TABS: 300.0000 mg | ORAL_TABLET | Freq: Every day | ORAL | 11 refills | Status: DC

## 2024-08-29 MED ORDER — FLUTICASONE PROPIONATE 50 MCG/ACT NA SUSP: 2.0000 | Freq: Every day | NASAL | 0 refills | Status: DC

## 2024-08-29 NOTE — Telephone Encounter (Signed)
 Pharmacy is requesting a 90 day supply

## 2024-08-29 NOTE — Progress Notes (Signed)
 Established Patient Office Visit  Subjective   Patient ID: [name], female    DOB: [birth year]  Age: 58 y.o. MRN: [id number]  Chief Complaint  Patient presents with   Cough   URI   Thais Silberstein is a 58 year old female with a past medical history of HTN, anemia, and morbid obesity who presents to the clinic today for cold symptoms, cough, headaches, and sore throat. Please see problem-based assessment and plan for details.    Review of Systems  Constitutional:  Positive for chills and malaise/fatigue.  HENT:  Positive for congestion and sore throat.        Post-nasal drip  Respiratory:  Positive for cough and shortness of breath. Negative for sputum production and wheezing.   Cardiovascular: Negative.   Gastrointestinal:  Positive for constipation and nausea. Negative for diarrhea.  Genitourinary: Negative.   Musculoskeletal: Negative.   Skin: Negative.   Neurological:  Positive for dizziness and headaches.  Psychiatric/Behavioral: Negative.       Objective:    VS: BP 161/103 Physical Exam Constitutional:      Appearance: Normal appearance.  HENT:     Head: Normocephalic.     Nose: Nose normal.     Mouth/Throat:     Mouth: Mucous membranes are moist.     Pharynx: No oropharyngeal exudate or posterior oropharyngeal erythema.  Eyes:     Extraocular Movements: Extraocular movements intact.     Pupils: Pupils are equal, round, and reactive to light.  Cardiovascular:     Rate and Rhythm: Normal rate and regular rhythm.     Pulses: Normal pulses.     Heart sounds: Normal heart sounds.  Pulmonary:     Effort: Pulmonary effort is normal.     Breath sounds: Normal breath sounds.  Abdominal:     General: Abdomen is flat.     Palpations: Abdomen is soft.  Musculoskeletal:        General: Normal range of motion.     Cervical back: Normal range of motion.  Skin:    General: Skin is warm and dry.     Capillary Refill: Capillary refill takes less than 2  seconds.  Neurological:     General: No focal deficit present.     Mental Status: She is alert and oriented to person, place, and time.  Psychiatric:        Mood and Affect: Mood normal.        Behavior: Behavior normal.      Assessment & Plan:   Patient seen with Dr. Mliss Foot.  Upper Respiratory Infection with Cough and Congestion Patient reports having new cough, headache, sore throat, and cold symptoms since last Sunday.  Her symptoms have been worsening since last Wednesday, peaking over the weekend.  She has not been able to sleep well due to her persistent cough and has been up late at night for the past week.  She endorses a sore throat that felt swollen to her mostly on her right side. She denies any facial swelling or lip swelling. She went to the [facility] emergency department on 9/6, where a chest x-ray, respiratory viral panel, and strep test were all done and were negative.  She was given DuoNebs and Decadron  in the emergency department, and discharged on doxycycline  and an albuterol  inhaler.  She states the albuterol  inhaler did not help her with her shortness of breath and only gave her palpitations.  She has tried over-the-counter Delsym , TheraFlu, Vicks cough drops, and  Mucinex  to help with her symptoms -- all of which have not helped.  She has not tried antihistamines or fluticasone  nasal spray. Discussed with patient this is likely a viral upper respiratory infection, for which may persist for some time.  Informed patient that if cough persistently worsens in the next couple of days to weeks to return to the clinic for further workup.  Patient voiced understanding. - Tessalon  Perles - Fluticasone  nasal spray   Essential Hypertension BP in office today 161/103.  She did not take her medicines this morning week takes them daily.  Patient recently uptitrated to Lisinopril  40mg  daily in 05/2024. She sees Dr. Verlin as her cardiologist, but was unable to follow up with their  office earlier this month due to her illness. She states she will notice her cough improve when she does not take her blood pressure medicine. She did not take it this morning and did not cough during her appointment with me. She is inquiring about switching her ACE inhibitor to another antihypertensive, as she is aware Lisinopril  is can have cough as a side effect.  She has been on her lisinopril  for the last 2 to 3 years. Discussed with the patient that her cough may be exacerbated by Lisinopril , but cannot definitively rule in her ACE inhibitor being the sole reason for her cough. Will switch patient to Irbesartan  300mg  daily. Encouraged patient to follow up with her cardiologist for further management of her hypertension. Will refer patient to our pharmacist, Dr. Lorain Baseman, for medication adjustment in 1 month and to check a BMP at that time to check for any electrolyte derangements or changes in kidney function.  -- follow up with Cardiology - Appointment with Dr. Lorain Baseman in 1 month.   Return in about 4 weeks (around 09/26/2024) for Dr. Layna Fox for Medication Adjustment.    Tomislav Micale, DO Internal Medicine Resident, PGY-1 10:12 AM 08/29/2024

## 2024-08-29 NOTE — Patient Instructions (Addendum)
 Thank you, Ms.Glenetta Rosaline [place] for allowing us  to provide your care today. Today we discussed your cough that has been going on for some time.     New medications to start: - Tessalon  Perles 100mg , for cough - Fluticasone  nasal spray, 2 sprays in each nostril once daily - Irbesartan  300mg  , taken once daily   STOP taking:  -  Lisinopril  40mg . This may be contributing to your cough, but it is a good idea to follow up with your cardiologist for further evaluation.   My Chart Access: [email or web address]  Please follow-up in: 1 month with our pharmacist, Dr. Lorain Baseman, to see how the new blood pressure pills work for you    We look forward to seeing you next time. Please call our clinic at [phone] if you have any questions or concerns. The best time to call is Monday-Friday from 9am-4pm, but there is someone available 24/7. If after hours or the weekend, call the main hospital number and ask for the Internal Medicine Resident On-Call. If you need medication refills, please notify your pharmacy one week in advance and they will send us  a request.   Thank you for letting us  take part in your care. Wishing you the best!  Kathe Wirick, DO 08/29/2024, 9:44 AM [facility]

## 2024-08-29 NOTE — Telephone Encounter (Signed)
 Pt has been seen today  Name: [name], [name] MRN: [id number]  Date: 08/29/2024 Status: Arrived  Time: 9:00 AM Length: 45  Visit Type: OPEN ESTABLISHED [726] Copay: $0.00  Provider: Amilibia, Jaden, DO      Copied from CRM [id number]. Topic: Appointments - Scheduling Inquiry for Clinic >> Aug 26, 2024  9:37 AM Gra[facility] ORN wrote: Reason for CRM: Patient not feeling well. Cold symptoms, sore throat, chest congestion, headache. Called for appt. No availability today. Added to waitlist. If something comes available today please let patient know. Thank You    ----------------------------------------------------------------------- From previous Reason for Contact - Scheduling: Patient/patient representative is calling to schedule an appointment. Refer to attachments for appointment information.

## 2024-08-30 NOTE — Progress Notes (Signed)
 Internal Medicine Clinic Attending  I was physically present during the key portions of the resident provided service and participated in the medical decision making of patient's management care. I reviewed pertinent patient test results.  The assessment, diagnosis, and plan were formulated together and I agree with the documentation in the resident's note.  Lovie Clarity, MD    Patient has what I suspect is a viral URI - will treat supportively.  She has also had a cough that preceded the viral URI. I agree with switching Lisinopril  to Irbesartan  to eliminate this possible variable as the cause of her persistent cough. I think she may need a second agent for her hypertension, so we'll arrange close follow up with our clinic pharmacist for a blood pressure check and BMP.

## 2024-09-05 ENCOUNTER — Telehealth: Payer: Self-pay | Admitting: *Deleted

## 2024-09-05 NOTE — Progress Notes (Signed)
 Care Guide Pharmacy Note  09/05/2024 Name: Elianny Buxbaum MRN: [id number] DOB: [birth year]  Referred By: [name], DO Reason for referral: Complex Care Management (Initial outreach to schedule referral with PharmD )   Tyesha Joffe is a 58 y.o. year old female who is a primary care patient of [name], DO.  Audrianna Driskill was referred to the pharmacist for assistance related to: HTN  Successful contact was made with the patient to discuss pharmacy services including being ready for the pharmacist to call at least 5 minutes before the scheduled appointment time and to have medication bottles and any blood pressure readings ready for review. The patient agreed to meet with the pharmacist via in office  on (date/time). 10/06/24 at 900 AM   [name]  [facility], [facility] Guide  Direct Dial: [phone]  Fax [phone]

## 2024-09-16 ENCOUNTER — Other Ambulatory Visit: Payer: Self-pay

## 2024-09-20 ENCOUNTER — Ambulatory Visit: Admitting: Student

## 2024-09-20 ENCOUNTER — Encounter: Payer: Self-pay | Admitting: Student

## 2024-09-20 VITALS — BP 172/100 | HR 87 | Temp 98.1°F | Wt 254.8 lb

## 2024-09-20 DIAGNOSIS — I10 Essential (primary) hypertension: Secondary | ICD-10-CM | POA: Diagnosis not present

## 2024-09-20 DIAGNOSIS — F17200 Nicotine dependence, unspecified, uncomplicated: Secondary | ICD-10-CM

## 2024-09-20 MED ORDER — VARENICLINE TARTRATE (STARTER) 0.5 MG X 11 & 1 MG X 42 PO TBPK: 1.0000 mg | ORAL_TABLET | Freq: Two times a day (BID) | ORAL | 1 refills | Status: AC

## 2024-09-20 NOTE — Patient Instructions (Addendum)
 [name],Thank you for allowing me to take part in your care today.  Here are your instructions.  1. Please start Chantix   It can cause vivid dreams. Initiate regimen 1 week before quit smoking date Days 1-3: 0.5 mg by mouth Daily Days 4-7: 0.5 mg by mouth twice daily  Day 8 to end of treatment: 1 mg by mouth twice daily   2.  Keep blood pressure log.  Take irbesartan  300 mg daily  3. Please come back in one month with your log filled out.    PLEASE BRING YOUR MEDICATIONS TO EVERY APPOINTMENT  Thank you, Dr. Tobie  If you have any other questions please contact the internal medicine clinic at [phone] If it is after hours, please call the [facility] at [phone] and then ask the person who picks up for the resident on call.

## 2024-09-20 NOTE — Progress Notes (Signed)
 CC: Blood pressure follow-up  HPI:  [name] is a 58 y.o.-year-old female with a past medical history of hypertension, mitral valve insufficiency, tobacco use, goiter who presents for 55-month follow-up appointment.  Please see assessment and plan for full HPI.  Medications: Headaches: Topiramate  50 mg nightly Hypertension: Irbesartan  300 mg daily Nausea: Zofran  4 mg every 6 hours Allergic rhinitis: Flonase : Incontinence: Vibegron  75 mg daily HFrEF: Metoprolol  succinate 25 mg daily, Lasix  40 mg daily  Past Medical History:  Diagnosis Date   Anemia    COVID-19 virus infection 11/2020   Fibroids    Heart palpitations    History of    History of blood transfusion    with knee replacement   History of colon polyps    Hypertension    Hypertension    no current meds for htn   Knee joint injury    Morbid obesity (HCC)    Palpitations    Pneumonia    remote history   Thyroid  goiter    bilateral goiter and cyst follow up every 2 years Dr. Arlana   Tobacco abuse      Current Outpatient Medications:    albuterol  (VENTOLIN  HFA) 108 (90 Base) MCG/ACT inhaler, Inhale 1-2 puffs into the lungs every 6 (six) hours as needed for wheezing or shortness of breath., Disp: 1 each, Rfl: 0   fluticasone  (FLONASE ) 50 MCG/ACT nasal spray, PLACE 2 SPRAYS INTO BOTH NOSTRILS DAILY, Disp: 1 g, Rfl: 3   furosemide  (LASIX ) 40 MG tablet, Take 1 tablet (40 mg total) by mouth daily., Disp: 90 tablet, Rfl: 0   irbesartan  (AVAPRO ) 300 MG tablet, Take 1 tablet (300 mg total) by mouth daily., Disp: 30 tablet, Rfl: 11   metoprolol  succinate (TOPROL -XL) 25 MG 24 hr tablet, TAKE 1 TABLET(25 MG) BY MOUTH DAILY. MAY TAKE 1 EXTRA TABLET AS NEEDED FOR HEART RACING, Disp: 180 tablet, Rfl: 3   ondansetron  (ZOFRAN ) 4 MG tablet, Take 1 tablet (4 mg total) by mouth every 6 (six) hours., Disp: 12 tablet, Rfl: 0   topiramate  (TOPAMAX ) 25 MG tablet, Take 2 tablets (50 mg total) by mouth at bedtime., Disp:  60 tablet, Rfl: 5   Vibegron  75 MG TABS, Take 1 tablet (75 mg total) by mouth daily., Disp: 30 tablet, Rfl: 3  Review of Systems:   Negative except for what is stated in HPI  Physical Exam:  Vitals:   09/20/24 1557  BP: (!) 178/110  Pulse: 84  Temp: 98.1 F (36.7 C)  TempSrc: Oral  SpO2: 100%  Weight: 254 lb 12.8 oz (115.6 kg)   General: Patient is sitting comfortably in the room  Head: Normocephalic, atraumatic  Cardio: Regular rate and rhythm, no murmurs, rubs or gallops Pulmonary: Clear to ausculation bilaterally with no rales, rhonchi, and crackles  Extremities: 2+ pedal pulses to bilateral lower extremities   Assessment & Plan:   Assessment & Plan Essential hypertension Patient has a past medical history of hypertension.  Blood pressure medication at this time includes irbesartan  300 mg daily.  She was recently on lisinopril , and switched off due to adverse effect of cough.  Patient has not picked up her irbesartan .  Blood pressure today elevated at 178/110 and on recheck it is 172/100.  She denies any chest pain, shortness of breath, or vision changes.  She states she smoked a cigarette before coming in as well.  With medication non adherence and smoking, this could have all contributed to elevated blood pressures today.  Will check ambulatory blood pressures before adjusting medications.  Plan: - Encourage patient to start taking her irbesartan  300 mg daily - Monitor ambulatory blood pressures on irbesartan  - Follow-up in 1 month with blood pressure log - Adjust medications at that time - At that visit also check BMP Tobacco use disorder Patient has a past medical history of tobacco use disorder.  Encourage cessation today.  She agreed to chantix daily.  Plan: - Start Chantix  - Spent 5 minutes on counseling    Patient discussed with Dr. Mliss Foot   Janet Blanch, DO Internal Medicine Resident PGY-3

## 2024-09-20 NOTE — Assessment & Plan Note (Signed)
 Patient has a past medical history of tobacco use disorder.  Encourage cessation today.  She agreed to chantix daily.  Plan: - Start Chantix  - Spent 5 minutes on counseling

## 2024-09-20 NOTE — Assessment & Plan Note (Signed)
 Patient has a past medical history of hypertension.  Blood pressure medication at this time includes irbesartan  300 mg daily.  She was recently on lisinopril , and switched off due to adverse effect of cough.  Patient has not picked up her irbesartan .  Blood pressure today elevated at 178/110 and on recheck it is 172/100.  She denies any chest pain, shortness of breath, or vision changes.  She states she smoked a cigarette before coming in as well.  With medication non adherence and smoking, this could have all contributed to elevated blood pressures today.  Will check ambulatory blood pressures before adjusting medications.  Plan: - Encourage patient to start taking her irbesartan  300 mg daily - Monitor ambulatory blood pressures on irbesartan  - Follow-up in 1 month with blood pressure log - Adjust medications at that time - At that visit also check BMP

## 2024-09-21 NOTE — Progress Notes (Signed)
 Internal Medicine Clinic Attending  Case discussed with the resident at the time of the visit.  We reviewed the resident's history and exam and pertinent patient test results.  I agree with the assessment, diagnosis, and plan of care documented in the resident's note.

## 2024-09-29 ENCOUNTER — Other Ambulatory Visit: Payer: Self-pay | Admitting: Student

## 2024-09-29 NOTE — Telephone Encounter (Signed)
 Medication discontinued 08/29/24

## 2024-10-06 ENCOUNTER — Telehealth: Payer: Self-pay

## 2024-10-06 ENCOUNTER — Ambulatory Visit

## 2024-10-06 NOTE — Progress Notes (Deleted)
 10/06/2024 Name: [name] MRN: [id number] DOB: Dec 20, 1966  No chief complaint on file.   [name] is a 58 y.o. year old female who was referred for medication management by their primary care provider, [name], DO. They presented for a face to face visit today.   They were referred to the pharmacist by their PCP for assistance in managing hypertension . PMH includes HTN, HFmrEF with mitral valve insufficiency (Echo June 2025 with EF 50-55%, global hypokinesis, G2DD, RV normal, moderate MV regurgitation), goiter, OA, tobacco use, BMI > 30.   Subjective: Patient was last seen by PCP, [name], DO, on 09/20/24. At last visit, initial BP was 178/110 mmHg, 172/100 mmHg on recheck. She had recently been switched from lisinopril  to irbesartan  300 mg daily. She had not picked up irbesartan  yet. She also had smoked a cigarette before coming in. She was instructed to monitor blood pressure at home and return in ~1 mo for pharmacy follow-up and BMP. She also expressed interest in quitting smoking and was initiated on varenicline.   Today, patient presents in *** good spirits and presents without *** any assistance. ***Patient is accompanied by ***.   Make sure not taking lisinopril , only irbesartan  Spiro would be most appropriate next step with low-normal EF Still taking furosemide ? Unable to tolerate amlodipine in the past - revisit?   Care Team: Primary Care Provider: [name], DO ; Next Scheduled Visit: needs to be scheduled [name] or [name]) Cardiologist: Dr. Verlin; Next Scheduled Visit: no showed on 08/15/24 and 08/24/24   Medication Access/Adherence  Current Pharmacy:  [id number] [address], [place] - [address] AT [facility] [address] [place] [address] Phone: [phone] Fax: [phone]  [facility] - [facility] [address],  [address] [place] [address] Phone: [phone] Fax: [phone]   Patient reports affordability concerns with their medications: {YES/NO:21197} Patient reports access/transportation concerns to their pharmacy: {YES/NO:21197} Patient reports adherence concerns with their medications:  {YES/NO:21197} ***  *** Patient denies adherence with medications, reports missing *** medications *** times per week, on average.   Hypertension:  Current medications: irbesartan  300 mg daily (filled 09/28/24 for 90ds), metoprolol  succinate 25 mg daily + 1 extra tablet as needed for heart racing (filled 07/27/23 for 90ds), furosemide  40 mg daily (filled 03/10/24 for 90ds)  Medications previously tried: amlodipine (dizziness), lisinopril  (possible cough)  Patient {HAS/DOES NOT YJCZ:65250} a validated, automated, upper arm home BP cuff Current blood pressure readings readings: ***  Patient {Actions; denies-reports:120008} hypotensive s/sx including ***dizziness, lightheadedness.  Patient {Actions; denies-reports:120008} hypertensive symptoms including ***headache, chest pain, shortness of breath  Current meal patterns: ***  Current physical activity: ***  Tobacco Abuse:  Current medications: varenicline 1 mg BID (***),   Tobacco Use History: Age when started using tobacco on a daily basis *** Number of cigarettes per day *** Smokes first cigarette *** minutes after waking {Does/does not:3044014::Does,Does not} wake at night to smoke Triggers include   Quit Attempt History: Most recent quit attempt *** Longest time ever been tobacco free *** Methods tried in the past include {CHL AMB [facility]MH MEDICATIONS FOR SMOKING CESSATION:20759}.  Rates IMPORTANCE of quitting tobacco on 1-10 scale of ***. Rates READINESS of quitting tobacco on 1-10 scale of ***. Rates CONFIDENCE of quitting tobacco on 1-10 scale of ***. Motivators to quitting include ***; barriers include {smoking cessation barriers:18118}    Current medication access support: ***  Objective:  BP Readings from Last 3 Encounters:  09/20/24 (!) 172/100  08/29/24 (!) 183/97  08/27/24 (!) 157/94    Lab Results  Component Value Date   HGBA1C 5.3 09/02/2023       Latest Ref Rng & Units 08/27/2024    9:06 AM 02/08/2024    9:16 AM 03/05/2023    9:14 AM  BMP  Glucose 70 - 99 mg/dL 98  91    BUN 6 - 20 mg/dL 13  18    Creatinine 9.55 - 1.00 mg/dL 8.79  8.93    Sodium 864 - 145 mmol/L 139  138  140    140   Potassium 3.5 - 5.1 mmol/L 4.5  4.3  4.0    4.0   Chloride 98 - 111 mmol/L 106  105    CO2 19 - 32 mEq/L  27    Calcium 8.4 - 10.5 mg/dL  9.1      Lab Results  Component Value Date   CHOL 113 10/18/2009   HDL 54 10/18/2009   LDLCALC 52 10/18/2009   TRIG 36 10/18/2009   CHOLHDL 2.1 Ratio 10/18/2009    Medications Reviewed Today   Medications were not reviewed in this encounter       Assessment/Plan:   Hypertension: - Currently {CHL Controlled/[phone]} with *** BP consistently {Desc; above/below:16086} goal {BP Goal:27557}. Patient {ACTION; IS/IS WNU:78978602} having s/sx of hypo- or hyper-tension. Medication adherence appears {Desc; appropriate/inappropriate:30686}. Control is suboptimal due to ***.  - Reviewed long term cardiovascular and renal outcomes of uncontrolled blood pressure - Reviewed appropriate blood pressure monitoring technique and reviewed goal blood pressure. Recommended to check home blood pressure and heart rate *** once daily and keep a log to bring to upcoming appointments - Recommend to ***     Tobacco Abuse - Currently {CHL Controlled/[phone]} - Provided motivational interviewing to assess tobacco use and strategies for reduction - Provided information on 1 800 QUIT NOW support program - {Tobacco Treatment:26423} - ***    Hyperlipidemia/ASCVD Risk Reduction: - Currently {CHL Controlled/[phone]} with most recent LDL-C of ***  {Desc; above/below:16086} goal {LDL Goals:32982} given ***. *** intensity statin indicated. - Reviewed long term complications of uncontrolled cholesterol - Reviewed lifestyle recommendations to lower LDL-C including regular physical activity, 5-10% weight loss, eating a diet low in saturated fat, and increasing intake of fiber to at least 25 g per day. - Reviewed lifestyle recommendations to lower TG including following a low-carb diet, restricting alcohol intake, and increasing physical activity and exercise - Recommend to ***    Written patient instructions provided. Patient verbalized understanding of treatment plan.   Follow Up Plan: *** Pharmacist *** PCP clinic visit in ***   Lorain Baseman, PharmD [facility] [phone]

## 2024-10-06 NOTE — Telephone Encounter (Signed)
 Attempted to contact patient for scheduled appointment for medication management at 9AM at [facility]. Left HIPAA compliant message for patient to return my call at their convenience.  Lorain Baseman, PharmD [facility] [phone]

## 2024-10-10 ENCOUNTER — Telehealth: Payer: Self-pay | Admitting: *Deleted

## 2024-10-10 NOTE — Progress Notes (Signed)
 Complex Care Management Care Guide Note  10/10/2024 Name: [name] MRN: [id number] DOB: 1966/03/10  [name] is a 58 y.o. year old female who is a primary care patient of [name], DO and is actively engaged with the care management team. I reached out to [name] by phone today to assist with re-scheduling  with the Pharmacist.  Follow up plan: Unsuccessful telephone outreach attempt made patient ask to call back at a later time.   [name]  [facility], [facility] Guide  Direct Dial: [phone]  Fax [phone]

## 2024-10-10 NOTE — Progress Notes (Signed)
 Complex Care Management Care Guide Note  10/10/2024 Name: [name] MRN: [id number] DOB: March 30, 1966  [name] is a 58 y.o. year old female who is a primary care patient of [name], DO and is actively engaged with the care management team. I reached out to [name] by phone today to assist with re-scheduling  with the Pharmacist.  Follow up plan: Telephone appointment with complex care management team member scheduled for:  10/27/24 @ 2 PM.  Leotis Rase [facility], [facility] Guide  Direct Dial: [phone]  Fax [phone]

## 2024-10-13 ENCOUNTER — Other Ambulatory Visit: Payer: Self-pay

## 2024-10-13 MED ORDER — BENZONATATE 100 MG PO CAPS
100.0000 mg | ORAL_CAPSULE | Freq: Three times a day (TID) | ORAL | 0 refills | Status: DC
  Filled 2024-10-13: qty 20, 7d supply, fill #0

## 2024-10-14 NOTE — Telephone Encounter (Signed)
 Spoke with the patient appointment has been sch for the following:  Name: [name], Frees MRN: [id number]  Date: 10/17/2024 Status: Sch  Time: 9:15 AM Length: 30  Visit Type: OFFICE VISIT [8002] Copay: $0.00  Provider: Benuel Braun, DO      Copied from CRM [id number]. Topic: Referral - Question >> Oct 12, 2024 12:57 PM DeAngela L wrote: Reason for CRM: Patient calling to ask if her provider can order her a diagnostic mammogram as soon as possible  Patient states she is having pain in her Left breast started Sunday 10/09/24 and was not able to schedule with the [facility] imaging when they called her to get scheduled, cause she told them about left breast pain, when they called her for scheduling a mammogram [facility] [phone] so this is why she is calling to ask if her provider can schedule this for her diagnostic mammogram   Pt num [phone] (M)  Decline NT call, she knows to go to breast people

## 2024-10-17 ENCOUNTER — Ambulatory Visit

## 2024-10-17 NOTE — Progress Notes (Unsigned)
 CC: Left breast pain that started Sunday 10/09/2024  HPI:  [name] is a 58 y.o. female with pertinent past medical history of benign neoplasm of transverse colon, class III severe obesity, uterine fibroids (further medical history stated below) and presents today for left breast pain. Please see problem based assessment and plan for additional details.  Last complaint of breast pain seen 10/15/2022: described as dull, aching, mostly on lateral side, occasional sharp pain. Denied nipple discharge, bleeding or redness. Mammogram 2021 did not show malignancy.  Mammogram 10/2022 showed no suspicious masses or calcifications in right breast. There is an oval circumscribed mass in lower central to slightly outer left breast measuring 0.5 cm. Oval hypoechoic mass in left breast 3 o'clock 4 cm from nipple measuring 0.3 x 0.3 x 0.6 cm, suggestive of cluster of complicated cysts/apocrine metaplasia; probably benign mass.  Mammogram 04/12/2024 showed breast mass measuring 1 dimension smaller than previous exam  Mammogram 11/04/2023 showed breast mass (stable) measuring 7 x 4 x 6 mm (previous 6 x 4 x 6 mm) with recommended f/u in 1 year.   Had a left breast biopsy 03/24/2019 revealed benign breast parenchyma with fibrocystic change on left breast 2 o'clock.    Last Pertinent Labs Documented:     Latest Ref Rng & Units 08/27/2024    9:06 AM 02/08/2024    9:16 AM 03/05/2023    9:14 AM  BMP  Glucose 70 - 99 mg/dL 98  91    BUN 6 - 20 mg/dL 13  18    Creatinine 9.55 - 1.00 mg/dL 8.79  8.93    Sodium 864 - 145 mmol/L 139  138  140    140   Potassium 3.5 - 5.1 mmol/L 4.5  4.3  4.0    4.0   Chloride 98 - 111 mmol/L 106  105    CO2 19 - 32 mEq/L  27    Calcium 8.4 - 10.5 mg/dL  9.1         Latest Ref Rng & Units 08/27/2024    9:06 AM 08/27/2024    8:51 AM 02/08/2024    9:16 AM  CBC  WBC 4.0 - 10.5 K/uL  4.0  4.3   Hemoglobin 12.0 - 15.0 g/dL 86.3  87.0  87.0   Hematocrit 36.0 - 46.0 %  40.0  40.6  38.2   Platelets 150 - 400 K/uL  221  180.0     Lab Results  Component Value Date   HGBA1C 5.3 09/02/2023     Past Medical History:  Diagnosis Date   Anemia    COVID-19 virus infection 11/2020   Fibroids    Heart palpitations    History of    History of blood transfusion    with knee replacement   History of colon polyps    Hypertension    Hypertension    no current meds for htn   Knee joint injury    Morbid obesity (HCC)    Palpitations    Pneumonia    remote history   Thyroid  goiter    bilateral goiter and cyst follow up every 2 years Dr. Arlana   Tobacco abuse     Current Outpatient Medications on File Prior to Visit  Medication Sig Dispense Refill   albuterol  (VENTOLIN  HFA) 108 (90 Base) MCG/ACT inhaler Inhale 1-2 puffs into the lungs every 6 (six) hours as needed for wheezing or shortness of breath. 1 each 0   benzonatate  (TESSALON ) 100 MG  capsule Take 1 capsule (100 mg total) by mouth 3 (three) times daily for up to 14 days as needed for cough. 20 capsule 0   fluticasone  (FLONASE ) 50 MCG/ACT nasal spray PLACE 2 SPRAYS INTO BOTH NOSTRILS DAILY 1 g 3   furosemide  (LASIX ) 40 MG tablet Take 1 tablet (40 mg total) by mouth daily. 90 tablet 0   irbesartan  (AVAPRO ) 300 MG tablet Take 1 tablet (300 mg total) by mouth daily. 30 tablet 11   metoprolol  succinate (TOPROL -XL) 25 MG 24 hr tablet TAKE 1 TABLET(25 MG) BY MOUTH DAILY. MAY TAKE 1 EXTRA TABLET AS NEEDED FOR HEART RACING 180 tablet 3   ondansetron  (ZOFRAN ) 4 MG tablet Take 1 tablet (4 mg total) by mouth every 6 (six) hours. 12 tablet 0   topiramate  (TOPAMAX ) 25 MG tablet Take 2 tablets (50 mg total) by mouth at bedtime. 60 tablet 5   Varenicline Tartrate, Starter, (CHANTIX STARTING MONTH PAK) 0.5 MG X 11 & 1 MG X 42 TBPK Take 1 mg by mouth 2 (two) times daily. 53 each 1   Vibegron  75 MG TABS Take 1 tablet (75 mg total) by mouth daily. 30 tablet 3   No current facility-administered medications on file prior  to visit.    Family History  Problem Relation Age of Onset   Hypertension Mother    Dementia Mother    Hypertension Father    Atrial fibrillation Father    Cancer Maternal Grandmother        ? lung   Colon cancer Neg Hx    Stomach cancer Neg Hx    Esophageal cancer Neg Hx     Social History   Socioeconomic History   Marital status: Single    Spouse name: Not on file   Number of children: 2   Years of education: Not on file   Highest education level: Not on file  Occupational History   Occupation: disabled    Employer: DISABLED    Occupation: disable  Tobacco Use   Smoking status: Every Day    Current packs/day: 0.20    Average packs/day: 0.2 packs/day for 20.0 years (4.0 ttl pk-yrs)    Types: Cigarettes   Smokeless tobacco: Never   Tobacco comments:    1-2  cigs per day/ a pack in 2 weeks 10/18- 23cigs a day-11/11/23  Vaping Use   Vaping status: Never Used  Substance and Sexual Activity   Alcohol use: Yes    Alcohol/week: 2.0 standard drinks of alcohol    Types: 2 Standard drinks or equivalent per week    Comment: occasionally once per month   Drug use: No   Sexual activity: Not Currently    Birth control/protection: Surgical  Other Topics Concern   Not on file  Social History Narrative   ** Merged History Encounter ** Applying for disability, previously worked in musician   Right handed   Caffeine rarely   Live in two story           What is your current occupation? disability   Do you live at home alone?   Who lives with you? son   What type of home do you live in: 1 story or 2 story? Two story appartment          Social Drivers of Health   Financial Resource Strain: Low Risk  (09/02/2023)   Overall Financial Resource Strain (CARDIA)    Difficulty of Paying Living Expenses: Not hard at all  Food Insecurity: Low  Risk  (03/29/2024)   Received from [facility]   Hunger Vital Sign    Within the past 12 months, you worried that your food would run  out before you got money to buy more: Never true    Within the past 12 months, the food you bought just didn't last and you didn't have money to get more. : Never true  Transportation Needs: No Transportation Needs (03/29/2024)   Received from [facility]   Transportation    In the past 12 months, has lack of reliable transportation kept you from medical appointments, meetings, work or from getting things needed for daily living? : No  Physical Activity: Insufficiently Active (09/02/2023)   Exercise Vital Sign    Days of Exercise per Week: 2 days    Minutes of Exercise per Session: 60 min  Stress: Stress Concern Present (09/02/2023)   [employer] of Occupational Health - Occupational Stress Questionnaire    Feeling of Stress : To some extent  Social Connections: Socially Isolated (09/02/2023)   Social Connection and Isolation Panel    Frequency of Communication with Friends and Family: Never    Frequency of Social Gatherings with Friends and Family: Never    Attends Religious Services: More than 4 times per year    Active Member of [employer] or Organizations: No    Attends Banker Meetings: Never    Marital Status: Never married  Intimate Partner Violence: Not At Risk (09/02/2023)   Humiliation, Afraid, Rape, and Kick questionnaire    Fear of Current or Ex-Partner: No    Emotionally Abused: No    Physically Abused: No    Sexually Abused: No    Review of Systems: ROS   There were no vitals filed for this visit.  Physical Exam: Physical Exam   Assessment & Plan:   Patient seen with Dr. {[id number]::Tpoopjfd,Z. Hoffman,Mullen,Narendra,Vincent,Guilloud,Lau,Machen,Winfrey}  Assessment & Plan    No orders of the defined types were placed in this encounter.    Shakemia Madera, DO Internal Medicine Resident, PGY-1 7:07 PM 10/17/2024

## 2024-10-17 NOTE — Progress Notes (Deleted)
 CC: Left breast pain that started Sunday 10/09/2024  HPI:  [name] is a 58 y.o. female with pertinent past medical history of benign neoplasm of transverse colon, class III severe obesity, uterine fibroids (further medical history stated below) and presents today for left breast pain. Please see problem based assessment and plan for additional details.  Last complaint of breast pain seen 10/15/2022: described as dull, aching, mostly on lateral side, occasional sharp pain. Denied nipple discharge, bleeding or redness. Mammogram 2021 did not show malignancy.  Mammogram 10/2022 showed no suspicious masses or calcifications in right breast. There is an oval circumscribed mass in lower central to slightly outer left breast measuring 0.5 cm. Oval hypoechoic mass in left breast 3 o'clock 4 cm from nipple measuring 0.3 x 0.3 x 0.6 cm, suggestive of cluster of complicated cysts/apocrine metaplasia; probably benign mass.  Mammogram 04/12/2024 showed breast mass measuring 1 dimension smaller than previous exam  Mammogram 11/04/2023 showed breast mass (stable) measuring 7 x 4 x 6 mm (previous 6 x 4 x 6 mm) with recommended f/u in 1 year.   Had a left breast biopsy 03/24/2019 revealed benign breast parenchyma with fibrocystic change on left breast 2 o'clock.    Last Pertinent Labs Documented:     Latest Ref Rng & Units 08/27/2024    9:06 AM 02/08/2024    9:16 AM 03/05/2023    9:14 AM  BMP  Glucose 70 - 99 mg/dL 98  91    BUN 6 - 20 mg/dL 13  18    Creatinine 9.55 - 1.00 mg/dL 8.79  8.93    Sodium 864 - 145 mmol/L 139  138  140    140   Potassium 3.5 - 5.1 mmol/L 4.5  4.3  4.0    4.0   Chloride 98 - 111 mmol/L 106  105    CO2 19 - 32 mEq/L  27    Calcium 8.4 - 10.5 mg/dL  9.1         Latest Ref Rng & Units 08/27/2024    9:06 AM 08/27/2024    8:51 AM 02/08/2024    9:16 AM  CBC  WBC 4.0 - 10.5 K/uL  4.0  4.3   Hemoglobin 12.0 - 15.0 g/dL 86.3  87.0  87.0   Hematocrit 36.0 - 46.0 %  40.0  40.6  38.2   Platelets 150 - 400 K/uL  221  180.0     Lab Results  Component Value Date   HGBA1C 5.3 09/02/2023     Past Medical History:  Diagnosis Date   Anemia    COVID-19 virus infection 11/2020   Fibroids    Heart palpitations    History of    History of blood transfusion    with knee replacement   History of colon polyps    Hypertension    Hypertension    no current meds for htn   Knee joint injury    Morbid obesity (HCC)    Palpitations    Pneumonia    remote history   Thyroid  goiter    bilateral goiter and cyst follow up every 2 years Dr. Arlana   Tobacco abuse     Current Outpatient Medications on File Prior to Visit  Medication Sig Dispense Refill   albuterol  (VENTOLIN  HFA) 108 (90 Base) MCG/ACT inhaler Inhale 1-2 puffs into the lungs every 6 (six) hours as needed for wheezing or shortness of breath. 1 each 0   benzonatate  (TESSALON ) 100 MG  capsule Take 1 capsule (100 mg total) by mouth 3 (three) times daily for up to 14 days as needed for cough. 20 capsule 0   fluticasone  (FLONASE ) 50 MCG/ACT nasal spray PLACE 2 SPRAYS INTO BOTH NOSTRILS DAILY 1 g 3   furosemide  (LASIX ) 40 MG tablet Take 1 tablet (40 mg total) by mouth daily. 90 tablet 0   irbesartan  (AVAPRO ) 300 MG tablet Take 1 tablet (300 mg total) by mouth daily. 30 tablet 11   metoprolol  succinate (TOPROL -XL) 25 MG 24 hr tablet TAKE 1 TABLET(25 MG) BY MOUTH DAILY. MAY TAKE 1 EXTRA TABLET AS NEEDED FOR HEART RACING 180 tablet 3   ondansetron  (ZOFRAN ) 4 MG tablet Take 1 tablet (4 mg total) by mouth every 6 (six) hours. 12 tablet 0   topiramate  (TOPAMAX ) 25 MG tablet Take 2 tablets (50 mg total) by mouth at bedtime. 60 tablet 5   Varenicline Tartrate, Starter, (CHANTIX STARTING MONTH PAK) 0.5 MG X 11 & 1 MG X 42 TBPK Take 1 mg by mouth 2 (two) times daily. 53 each 1   Vibegron  75 MG TABS Take 1 tablet (75 mg total) by mouth daily. 30 tablet 3   No current facility-administered medications on file prior  to visit.    Family History  Problem Relation Age of Onset   Hypertension Mother    Dementia Mother    Hypertension Father    Atrial fibrillation Father    Cancer Maternal Grandmother        ? lung   Colon cancer Neg Hx    Stomach cancer Neg Hx    Esophageal cancer Neg Hx     Social History   Socioeconomic History   Marital status: Single    Spouse name: Not on file   Number of children: 2   Years of education: Not on file   Highest education level: Not on file  Occupational History   Occupation: disabled    Employer: DISABLED    Occupation: disable  Tobacco Use   Smoking status: Every Day    Current packs/day: 0.20    Average packs/day: 0.2 packs/day for 20.0 years (4.0 ttl pk-yrs)    Types: Cigarettes   Smokeless tobacco: Never   Tobacco comments:    1-2  cigs per day/ a pack in 2 weeks 10/18- 23cigs a day-11/11/23  Vaping Use   Vaping status: Never Used  Substance and Sexual Activity   Alcohol use: Yes    Alcohol/week: 2.0 standard drinks of alcohol    Types: 2 Standard drinks or equivalent per week    Comment: occasionally once per month   Drug use: No   Sexual activity: Not Currently    Birth control/protection: Surgical  Other Topics Concern   Not on file  Social History Narrative   ** Merged History Encounter ** Applying for disability, previously worked in musician   Right handed   Caffeine rarely   Live in two story           What is your current occupation? disability   Do you live at home alone?   Who lives with you? son   What type of home do you live in: 1 story or 2 story? Two story appartment          Social Drivers of Health   Financial Resource Strain: Low Risk  (09/02/2023)   Overall Financial Resource Strain (CARDIA)    Difficulty of Paying Living Expenses: Not hard at all  Food Insecurity: Low  Risk  (03/29/2024)   Received from [facility]   Hunger Vital Sign    Within the past 12 months, you worried that your food would run  out before you got money to buy more: Never true    Within the past 12 months, the food you bought just didn't last and you didn't have money to get more. : Never true  Transportation Needs: No Transportation Needs (03/29/2024)   Received [facility]   Transportation    In the past 12 months, has lack of reliable transportation kept you from medical appointments, meetings, work or from getting things needed for daily living? : No  Physical Activity: Insufficiently Active (09/02/2023)   Exercise Vital Sign    Days of Exercise per Week: 2 days    Minutes of Exercise per Session: 60 min  Stress: Stress Concern Present (09/02/2023)   [employer] of Occupational [employer] - Occupational Stress Questionnaire    Feeling of Stress : To some extent  Social Connections: Socially Isolated (09/02/2023)   Social Connection and Isolation Panel    Frequency of Communication with Friends and Family: Never    Frequency of Social Gatherings with Friends and Family: Never    Attends Religious Services: More than 4 times per year    Active Member of [employer] or Organizations: No    Attends Banker Meetings: Never    Marital Status: Never married  Intimate Partner Violence: Not At Risk (09/02/2023)   Humiliation, Afraid, Rape, and Kick questionnaire    Fear of Current or Ex-Partner: No    Emotionally Abused: No    Physically Abused: No    Sexually Abused: No    Review of Systems: ROS   There were no vitals filed for this visit.  Physical Exam: Physical Exam   Assessment & Plan:   Patient {[id number]::discussed with,seen with} Dr. {[id number]::Tpoopjfd,Z. Hoffman,Mullen,Narendra,Vincent,Guilloud,Lau,Machen,Winfrey}  Assessment & Plan    No orders of the defined types were placed in this encounter.    Sallyanne Primas, D.O. [facility] Internal Medicine, PGY-1 Date 10/17/2024 Time 8:20 AM

## 2024-10-18 ENCOUNTER — Other Ambulatory Visit: Payer: Self-pay

## 2024-10-18 ENCOUNTER — Ambulatory Visit (INDEPENDENT_AMBULATORY_CARE_PROVIDER_SITE_OTHER)

## 2024-10-18 VITALS — BP 128/91 | HR 90 | Wt 257.4 lb

## 2024-10-18 DIAGNOSIS — Z8619 Personal history of other infectious and parasitic diseases: Secondary | ICD-10-CM

## 2024-10-18 DIAGNOSIS — N644 Mastodynia: Secondary | ICD-10-CM

## 2024-10-18 DIAGNOSIS — R21 Rash and other nonspecific skin eruption: Secondary | ICD-10-CM | POA: Diagnosis not present

## 2024-10-18 DIAGNOSIS — Z0000 Encounter for general adult medical examination without abnormal findings: Secondary | ICD-10-CM

## 2024-10-18 DIAGNOSIS — F17210 Nicotine dependence, cigarettes, uncomplicated: Secondary | ICD-10-CM | POA: Diagnosis not present

## 2024-10-18 DIAGNOSIS — Z872 Personal history of diseases of the skin and subcutaneous tissue: Secondary | ICD-10-CM

## 2024-10-18 MED ORDER — HYDROCORTISONE 2.5 % EX CREA
TOPICAL_CREAM | Freq: Two times a day (BID) | CUTANEOUS | 0 refills | Status: AC
  Filled 2024-10-18: qty 30, 15d supply, fill #0

## 2024-10-18 NOTE — Assessment & Plan Note (Signed)
 New, been present for at least 1 month. Patient describes the rash as itchy and draining clear fluid. It is warm to the touch without marked erythema, but is tender to palpation. Patient has had prior fungal infections on her foot, and has tried applying terbinafine  and OTC fungal creams to this rash without resolution. Rash looks more like dermatitis in nature, with some small clear pustules and scale. She would like a referral to dermatology and a topical for the rash on her hand. Will send medium-potency steroid to pharmacy.  - Hydrocortisone  2.5% cream  - Amb referral to dermatology

## 2024-10-18 NOTE — Assessment & Plan Note (Addendum)
 Patient presents with new left breast pain since 10/09/2024. She has had this type of breast pain before -- described as dull, aching on lateral side, however now this pain is extending into her axilla. She has recently worn a bra with an underwire that she thinks may have contributed to her pain. She does not have a family history of breast cancer. Denies changes/dimpling of the skin, nipple discharge, bleeding, or redness. Breast exam revealed no skin changes, nipple discharge, erythema, but did elicit tenderness to palpation on lateral left breast and left axilla. Prior biopsy of left breast from 2020 revealed benign breast parenchyma and fibrocystic changes at the 2 o'clock position. Has had annual mammograms with measured stable, hypoechoic mass, suggestive of benign mass/cystic apocrine metaplasia.  - Will send for diagnostic mammogram and ultrasound of left breast.  - Will also send screening mammogram for right breast, as patient is due for her annual exam.

## 2024-10-18 NOTE — Patient Instructions (Addendum)
 Thank you, [name] for allowing us  to provide your care today. Today we discussed the new lump in your breast that is concerning you.    Referrals: -Dermatology referral   New medications: -Hydrocortisone  Cream 2.5%    My Chart Access: [email or web address]  Please follow-up in: 6 months for BP control    We look forward to seeing you next time. Please call our clinic at [phone] if you have any questions or concerns. The best time to call is Monday-Friday from 9am-4pm, but there is someone available 24/7. If after hours or the weekend, call the main hospital number and ask for the Internal Medicine Resident On-Call. If you need medication refills, please notify your pharmacy one week in advance and they will send us  a request.   Thank you for letting us  take part in your care. Wishing you the best!   Sophia Sperry, DO 10/18/2024, 2:33 PM [name]

## 2024-10-18 NOTE — Progress Notes (Signed)
 Established Patient Office Visit  Subjective   Patient ID: [name], female    DOB: January 30, 1966  Age: 58 y.o. MRN: [id number]  CC: Left-sided breast pain starting on 10/19  [name] is a 58 year old female with a past medical history of HFrEF (echo EF 50-55% on 05/2024), hypertension, mitral valve insufficiency, tobacco use, goiter, and class III severe obesity who presents to the clinic today for new left breast pain and needs an updated mammogram. She noticed the pain about 10 days ago when she wore a tighter bra than usual. It is dull in nature, and then went away for about a week and came back this morning. Denies fevers, chills, or skin changes. Please see problem-based assessment and plan for details.      Review of Systems  Constitutional: Negative.   Eyes: Negative.   Respiratory: Negative.    Cardiovascular: Negative.   Gastrointestinal: Negative.   Genitourinary: Negative.   Skin:  Positive for itching and rash.  Endo/Heme/Allergies: Negative.   Psychiatric/Behavioral: Negative.       Objective:    BP (!) 128/91 (BP Location: Left Arm, Patient Position: Sitting, Cuff Size: Normal)   Pulse 90   Wt 257 lb 6.4 oz (116.8 kg)   LMP 06/07/2016   SpO2 98%   BMI 41.55 kg/m  Physical Exam Constitutional:      Appearance: Normal appearance.  Cardiovascular:     Rate and Rhythm: Normal rate and regular rhythm.     Pulses: Normal pulses.     Heart sounds: Normal heart sounds.  Pulmonary:     Effort: Pulmonary effort is normal.     Breath sounds: Normal breath sounds.  Abdominal:     General: Abdomen is flat.  Musculoskeletal:        General: Normal range of motion.  Skin:    General: Skin is warm and dry.     Comments: Small, scaly lesion of right hand.   Neurological:     General: No focal deficit present.     Mental Status: She is alert and oriented to person, place, and time.  Psychiatric:        Mood and Affect: Mood normal.         Behavior: Behavior normal.       Assessment & Plan:   Patient seen with Dr. Mliss Foot.  Problem List Items Addressed This Visit       Musculoskeletal and Integument   Rash of hand   New, been present for at least 1 month. Patient describes the rash as itchy and draining clear fluid. It is warm to the touch without marked erythema, but is tender to palpation. Patient has had prior fungal infections on her foot, and has tried applying terbinafine  and OTC fungal creams to this rash without resolution. Rash looks more like dermatitis in nature, with some small clear pustules and scale. She would like a referral to dermatology and a topical for the rash on her hand. Will send medium-potency steroid to pharmacy.  - Hydrocortisone  2.5% cream  - Amb referral to dermatology      Relevant Medications   hydrocortisone  2.5 % cream   Other Relevant Orders   Ambulatory referral to Dermatology     Other   Breast pain, left - Primary   Patient presents with new left breast pain since 10/09/2024. She has had this type of breast pain before -- described as dull, aching on lateral side, however now this pain is extending into  her axilla. She has recently worn a bra with an underwire that she thinks may have contributed to her pain. She does not have a family history of breast cancer. Denies changes/dimpling of the skin, nipple discharge, bleeding, or redness. Breast exam revealed no skin changes, nipple discharge, erythema, but did elicit tenderness to palpation on lateral left breast and left axilla. Prior biopsy of left breast from 2020 revealed benign breast parenchyma and fibrocystic changes at the 2 o'clock position. Has had annual mammograms with measured stable, hypoechoic mass, suggestive of benign mass/cystic apocrine metaplasia.  - Will send for diagnostic mammogram and ultrasound of left breast.  - Will also send screening mammogram for right breast, as patient is due for her annual  exam.       Relevant Orders   US  LIMITED ULTRASOUND INCLUDING AXILLA LEFT BREAST    MM 3D DIAGNOSTIC MAMMOGRAM BILATERAL BREAST   Other Visit Diagnoses       Healthcare maintenance       Relevant Orders   MM 3D SCREENING MAMMOGRAM UNILATERAL RIGHT BREAST       Return in about 6 months (around 04/18/2025) for BP check.   Janet Spates, DO Internal Medicine Resident, PGY-1 2:47 PM 10/18/2024

## 2024-10-20 DIAGNOSIS — L309 Dermatitis, unspecified: Secondary | ICD-10-CM | POA: Diagnosis not present

## 2024-10-20 DIAGNOSIS — D219 Benign neoplasm of connective and other soft tissue, unspecified: Secondary | ICD-10-CM | POA: Diagnosis not present

## 2024-10-21 ENCOUNTER — Other Ambulatory Visit: Payer: Self-pay

## 2024-10-24 ENCOUNTER — Other Ambulatory Visit: Payer: Self-pay

## 2024-10-24 NOTE — Progress Notes (Signed)
Internal Medicine Clinic Attending  I was physically present during the key portions of the resident provided service and participated in the medical decision making of patient's management care. I reviewed pertinent patient test results.  The assessment, diagnosis, and plan were formulated together and I agree with the documentation in the resident's note.  Mercie Eon, MD

## 2024-10-26 ENCOUNTER — Other Ambulatory Visit: Payer: Self-pay

## 2024-10-27 ENCOUNTER — Other Ambulatory Visit: Payer: Self-pay

## 2024-10-27 ENCOUNTER — Ambulatory Visit

## 2024-10-27 NOTE — Progress Notes (Deleted)
 10/27/2024 Name: [name] MRN: [id number] DOB: 18-Nov-1966  No chief complaint on file.   [name] is a 58 y.o. year old female who was referred for medication management by their primary care provider, Janet Gee, DO. They presented for a face to face visit today.   They were referred to the pharmacist by their PCP for assistance in managing hypertension . PMH includes HTN, HFmrEF with mitral valve insufficiency (Echo June 2025 with EF 50-55%, global hypokinesis, G2DD, RV normal, moderate MV regurgitation), goiter, OA, tobacco use, BMI > 30.   Subjective: Patient was last seen by PCP, Janet Blanch, DO, on 09/20/24. At last visit, initial BP was 178/110 mmHg, 172/100 mmHg on recheck. She had recently been switched from lisinopril  to irbesartan  300 mg daily. She had not picked up irbesartan  yet. She also had smoked a cigarette before coming in. She was instructed to monitor blood pressure at home and return in ~1 mo for pharmacy follow-up and BMP. She also expressed interest in quitting smoking and was initiated on varenicline. She requested to reschedule pharmacy appt on 10/06/24. She came in for an acute visit on 10/18/24 and saw [name]. BP was improved at 128/91 mmHg, HR 90.   Today, patient presents in *** good spirits and presents without *** any assistance. ***Patient is accompanied by ***.   NEEDS BMP, lipid panel, UACR never done  Make sure not taking lisinopril , only irbesartan  Spiro would be most appropriate next step with low-normal EF Still taking furosemide ? Unable to tolerate amlodipine in the past - revisit?   Care Team: Primary Care Provider: [name], DO ; Next Scheduled Visit: needs to be scheduled [name] or [name]) Cardiologist: Dr. Verlin; Next Scheduled Visit: no showed on 08/15/24 and 08/24/24   Medication Access/Adherence  Current Pharmacy:  [id number] [address], [place] - [address] AT [facility] OF  [address] [place] [address] Phone: [phone] Fax: [phone]  [facility] - [facility] [address], [address] [place] [address] Phone: [phone] Fax: [phone]   Patient reports affordability concerns with their medications: {YES/NO:21197} Patient reports access/transportation concerns to their pharmacy: {YES/NO:21197} Patient reports adherence concerns with their medications:  {YES/NO:21197} ***  *** Patient denies adherence with medications, reports missing *** medications *** times per week, on average.   Hypertension:  Current medications: irbesartan  300 mg daily (filled 09/28/24 for 90ds), metoprolol  succinate 25 mg daily + 1 extra tablet as needed for heart racing (filled 07/27/23 for 90ds), furosemide  40 mg daily (filled 03/10/24 for 90ds)  Medications previously tried: amlodipine (dizziness), lisinopril  (possible cough)  Patient {HAS/DOES NOT YJCZ:65250} a validated, automated, upper arm home BP cuff Current blood pressure readings readings: ***  Patient {Actions; denies-reports:120008} hypotensive s/sx including ***dizziness, lightheadedness.  Patient {Actions; denies-reports:120008} hypertensive symptoms including ***headache, chest pain, shortness of breath  Current meal patterns: ***  Current physical activity: ***  Tobacco Abuse:  Current medications: varenicline 1 mg BID (***),   Tobacco Use History: Age when started using tobacco on a daily basis *** Number of cigarettes per day *** Smokes first cigarette *** minutes after waking {Does/does not:3044014::Does,Does not} wake at night to smoke Triggers include   Quit Attempt History: Most recent quit attempt *** Longest time ever been tobacco free *** Methods tried in the past include {CHL AMB [facility] MEDICATIONS FOR SMOKING CESSATION:20759}.  Rates IMPORTANCE of quitting tobacco on 1-10 scale of  ***.  Rates READINESS of quitting tobacco on 1-10 scale of ***. Rates CONFIDENCE of quitting tobacco on 1-10 scale of ***. Motivators to quitting include ***; barriers include {smoking cessation barriers:18118}   Current medication access support: ***   Objective:  BP Readings from Last 3 Encounters:  10/18/24 (!) 128/91  09/20/24 (!) 172/100  08/29/24 (!) 183/97    Lab Results  Component Value Date   HGBA1C 5.3 09/02/2023       Latest Ref Rng & Units 08/27/2024    9:06 AM 02/08/2024    9:16 AM 03/05/2023    9:14 AM  BMP  Glucose 70 - 99 mg/dL 98  91    BUN 6 - 20 mg/dL 13  18    Creatinine 9.55 - 1.00 mg/dL 8.79  8.93    Sodium 864 - 145 mmol/L 139  138  140    140   Potassium 3.5 - 5.1 mmol/L 4.5  4.3  4.0    4.0   Chloride 98 - 111 mmol/L 106  105    CO2 19 - 32 mEq/L  27    Calcium 8.4 - 10.5 mg/dL  9.1      Lab Results  Component Value Date   CHOL 113 10/18/2009   HDL 54 10/18/2009   LDLCALC 52 10/18/2009   TRIG 36 10/18/2009   CHOLHDL 2.1 Ratio 10/18/2009    Medications Reviewed Today   Medications were not reviewed in this encounter       Assessment/Plan:   Hypertension: - Currently {CHL Controlled/Uncontrolled:[id number]} with *** BP consistently {Desc; above/below:16086} goal {BP Goal:27557}. Patient {ACTION; IS/IS WNU:78978602} having s/sx of hypo- or hyper-tension. Medication adherence appears {Desc; appropriate/inappropriate:30686}. Control is suboptimal due to ***.  - Reviewed long term cardiovascular and renal outcomes of uncontrolled blood pressure - Reviewed appropriate blood pressure monitoring technique and reviewed goal blood pressure. Recommended to check home blood pressure and heart rate *** once daily and keep a log to bring to upcoming appointments - Recommend to ***     Tobacco Abuse - Currently {CHL Controlled/Uncontrolled:[id number]} - Provided motivational interviewing to assess tobacco use and strategies for reduction -  Provided information on 1 800 QUIT NOW support program - {Tobacco Treatment:26423} - ***    Hyperlipidemia/ASCVD Risk Reduction: - Currently {CHL Controlled/Uncontrolled:[id number]} with most recent LDL-C of *** {Desc; above/below:16086} goal {LDL Goals:32982} given ***. *** intensity statin indicated. - Reviewed long term complications of uncontrolled cholesterol - Reviewed lifestyle recommendations to lower LDL-C including regular physical activity, 5-10% weight loss, eating a diet low in saturated fat, and increasing intake of fiber to at least 25 g per day. - Reviewed lifestyle recommendations to lower TG including following a low-carb diet, restricting alcohol intake, and increasing physical activity and exercise - Recommend to ***    Written patient instructions provided. Patient verbalized understanding of treatment plan.   Follow Up Plan: *** Pharmacist *** PCP clinic visit in ***   Lorain Baseman, PharmD [facility] [phone]

## 2024-10-31 ENCOUNTER — Ambulatory Visit: Admitting: Obstetrics

## 2024-11-02 ENCOUNTER — Emergency Department (HOSPITAL_COMMUNITY)

## 2024-11-02 ENCOUNTER — Emergency Department (HOSPITAL_COMMUNITY)
Admission: EM | Admit: 2024-11-02 | Discharge: 2024-11-02 | Disposition: A | Discharge status: Home / Self Care | Attending: Emergency Medicine | Admitting: Emergency Medicine

## 2024-11-02 ENCOUNTER — Other Ambulatory Visit: Payer: Self-pay

## 2024-11-02 ENCOUNTER — Encounter (HOSPITAL_COMMUNITY): Payer: Self-pay

## 2024-11-02 DIAGNOSIS — R059 Cough, unspecified: Secondary | ICD-10-CM | POA: Diagnosis present

## 2024-11-02 DIAGNOSIS — J069 Acute upper respiratory infection, unspecified: Secondary | ICD-10-CM | POA: Diagnosis not present

## 2024-11-02 DIAGNOSIS — J0100 Acute maxillary sinusitis, unspecified: Secondary | ICD-10-CM | POA: Diagnosis not present

## 2024-11-02 LAB — CBC
HCT: 38.8 % (ref 36.0–46.0)
Hemoglobin: 12.8 g/dL (ref 12.0–15.0)
MCH: 33.3 pg (ref 26.0–34.0)
MCHC: 33 g/dL (ref 30.0–36.0)
MCV: 101 fL — ABNORMAL HIGH (ref 80.0–100.0)
Platelets: 189 K/uL (ref 150–400)
RBC: 3.84 MIL/uL — ABNORMAL LOW (ref 3.87–5.11)
RDW: 15.5 % (ref 11.5–15.5)
WBC: 4.8 K/uL (ref 4.0–10.5)
nRBC: 0 % (ref 0.0–0.2)

## 2024-11-02 LAB — BASIC METABOLIC PANEL WITH GFR
Anion gap: 10 (ref 5–15)
BUN: 12 mg/dL (ref 6–20)
CO2: 23 mmol/L (ref 22–32)
Calcium: 8.9 mg/dL (ref 8.9–10.3)
Chloride: 105 mmol/L (ref 98–111)
Creatinine, Ser: 1.08 mg/dL — ABNORMAL HIGH (ref 0.44–1.00)
GFR, Estimated: 59 mL/min — ABNORMAL LOW (ref >60)
Glucose, Bld: 106 mg/dL — ABNORMAL HIGH (ref 70–99)
Potassium: 4.1 mmol/L (ref 3.5–5.1)
Sodium: 138 mmol/L (ref 135–145)

## 2024-11-02 LAB — TROPONIN I (HIGH SENSITIVITY)
Troponin I (High Sensitivity): 17 ng/L (ref <18)
Troponin I (High Sensitivity): 16 ng/L (ref <18)

## 2024-11-02 LAB — RESP PANEL BY RT-PCR (RSV, FLU A&B, COVID)  RVPGX2
Influenza A by PCR: NEGATIVE
Influenza B by PCR: NEGATIVE
Resp Syncytial Virus by PCR: NEGATIVE
SARS Coronavirus 2 by RT PCR: NEGATIVE

## 2024-11-02 MED ORDER — PREDNISONE 10 MG (21) PO TBPK: ORAL_TABLET | Freq: Every day | ORAL | 0 refills | Status: DC

## 2024-11-02 MED ORDER — PSEUDOEPHEDRINE-CODEINE-GG 30-10-100 MG/5ML PO SOLN: 10.0000 mL | Freq: Four times a day (QID) | ORAL | 0 refills | Status: AC | PRN

## 2024-11-02 MED ORDER — AMOXICILLIN 500 MG PO CAPS: 500.0000 mg | ORAL_CAPSULE | Freq: Three times a day (TID) | ORAL | 0 refills | Status: DC

## 2024-11-02 NOTE — ED Triage Notes (Signed)
 PT presents for HA, sore throat and bodyaches.

## 2024-11-02 NOTE — ED Provider Triage Note (Signed)
 Emergency Medicine Provider Triage Evaluation Note  [name] , a 58 y.o. female  was evaluated in triage.  Pt complains of cough body aches headaches chills.  Patient has been taking Tessalon  and albuterol  without relief.  Review of Systems  Positive: Cough Negative: Fever  Physical Exam  Ht 5' 6 (1.676 m)   Wt 114.8 kg   LMP 06/07/2016   BMI 40.84 kg/m  Gen:   Awake, no distress   Resp:  Normal effort no wheezing MSK:   Moves extremities without difficulty  Other:    Medical Decision Making  Medically screening exam initiated at 1:30 PM.  Appropriate orders placed.  Shylee Durrett was informed that the remainder of the evaluation will be completed by another provider, this initial triage assessment does not replace that evaluation, and the importance of remaining in the ED until their evaluation is complete.     Arloa Chroman, PA-C 11/02/24 1331

## 2024-11-02 NOTE — ED Provider Notes (Signed)
 [place] EMERGENCY DEPARTMENT AT [facility] Provider Note   CSN: [id number] Arrival date & time: 11/02/24  1218     Patient presents with: Shortness of Breath, Sore Throat, and Cough   [name] is a 58 y.o. female who presents to the ED with a 1-1/2-week history of headache, sore throat, and bodyaches.  Has been taking over-the-counter medications as well as previous prescription of albuterol  for cough, and states that she has little to no improvement with these remedies hence her presentation to the ED tonight.  She denies have any chest pain, denies having any overt dyspnea, does have a productive cough of with occasional yellow-green sputum.    Shortness of Breath Associated symptoms: cough   Sore Throat Associated symptoms include shortness of breath.  Cough Associated symptoms: shortness of breath        Prior to Admission medications   Medication Sig Start Date End Date Taking? Authorizing Provider  amoxicillin  (AMOXIL ) 500 MG capsule Take 1 capsule (500 mg total) by mouth 3 (three) times daily. 11/02/24  Yes Myriam Dorn BROCKS, PA  predniSONE  (STERAPRED UNI-PAK 21 TAB) 10 MG (21) TBPK tablet Take by mouth daily. Take 6 tabs by mouth daily  for 2 days, then 5 tabs for 2 days, then 4 tabs for 2 days, then 3 tabs for 2 days, 2 tabs for 2 days, then 1 tab by mouth daily for 2 days 11/02/24  Yes Myriam Dorn BROCKS, PA  pseudoephedrine-codeine-guaifenesin  (MYTUSSIN DAC) 30-10-100 MG/5ML solution Take 10 mLs by mouth 4 (four) times daily as needed for cough. 11/02/24  Yes Myriam Dorn BROCKS, PA  albuterol  (VENTOLIN  HFA) 108 (90 Base) MCG/ACT inhaler Inhale 1-2 puffs into the lungs every 6 (six) hours as needed for wheezing or shortness of breath. 08/27/24   Elnor Hila P, DO  benzonatate  (TESSALON ) 100 MG capsule Take 1 capsule (100 mg total) by mouth 3 (three) times daily for up to 14 days as needed for cough. 08/29/24   Amilibia, Jaden, DO  fluticasone   (FLONASE ) 50 MCG/ACT nasal spray PLACE 2 SPRAYS INTO BOTH NOSTRILS DAILY 08/29/24 08/29/25  Amilibia, Jaden, DO  furosemide  (LASIX ) 40 MG tablet Take 1 tablet (40 mg total) by mouth daily. 03/10/24   Verlin Lonni BIRCH, MD  hydrocortisone  2.5 % cream Apply topically 2 (two) times daily. 10/18/24 11/17/24  Amilibia, Jaden, DO  irbesartan  (AVAPRO ) 300 MG tablet Take 1 tablet (300 mg total) by mouth daily. 08/29/24 08/29/25  Amilibia, Jaden, DO  metoprolol  succinate (TOPROL -XL) 25 MG 24 hr tablet TAKE 1 TABLET(25 MG) BY MOUTH DAILY. MAY TAKE 1 EXTRA TABLET AS NEEDED FOR HEART RACING 03/31/23   Verlin Lonni BIRCH, MD  ondansetron  (ZOFRAN ) 4 MG tablet Take 1 tablet (4 mg total) by mouth every 6 (six) hours. 08/27/24   Elnor Hila P, DO  topiramate  (TOPAMAX ) 25 MG tablet Take 2 tablets (50 mg total) by mouth at bedtime. 11/11/23   Leigh Venetia CROME, MD  Varenicline Tartrate, Starter, (CHANTIX STARTING MONTH PAK) 0.5 MG X 11 & 1 MG X 42 TBPK Take 1 mg by mouth 2 (two) times daily. 09/20/24 12/19/24  Tobie Gaines, DO  Vibegron  75 MG TABS Take 1 tablet (75 mg total) by mouth daily. 08/11/24   Zuleta, Kaitlin G, NP    Allergies: Tramadol , Ibuprofen , and Morphine     Review of Systems  Respiratory:  Positive for cough and shortness of breath.   All other systems reviewed and are negative.   Updated Vital  Signs BP (!) 197/80 (BP Location: Right Arm)   Pulse 96   Temp 98.5 F (36.9 C) (Oral)   Resp (!) 21   Ht 5' 6 (1.676 m)   Wt 114.8 kg   LMP 06/07/2016   SpO2 97%   BMI 40.84 kg/m   Physical Exam Vitals and nursing note reviewed.  Constitutional:      General: She is not in acute distress.    Appearance: Normal appearance.  HENT:     Head: Normocephalic and atraumatic.     Nose: Septal deviation, congestion and rhinorrhea present. Rhinorrhea is clear.     Right Turbinates: Enlarged and swollen.     Left Turbinates: Enlarged and swollen.     Comments: Erythematous, swollen and enlarged nasal  turbinates bilaterally with a anterior left-sided septal deviation appreciated.  Copious congestion appreciated in both naris.    Mouth/Throat:     Mouth: Mucous membranes are moist.     Pharynx: Oropharynx is clear. Uvula midline. Posterior oropharyngeal erythema and postnasal drip present.  Eyes:     Extraocular Movements: Extraocular movements intact.     Conjunctiva/sclera: Conjunctivae normal.     Pupils: Pupils are equal, round, and reactive to light.  Cardiovascular:     Rate and Rhythm: Normal rate and regular rhythm.     Pulses: Normal pulses.     Heart sounds: Normal heart sounds. No murmur heard.    No friction rub. No gallop.  Pulmonary:     Effort: Pulmonary effort is normal.     Breath sounds: Normal breath sounds.  Abdominal:     General: Abdomen is flat. Bowel sounds are normal.     Palpations: Abdomen is soft.  Musculoskeletal:        General: Normal range of motion.     Cervical back: Normal range of motion and neck supple.     Right lower leg: No edema.     Left lower leg: No edema.  Skin:    General: Skin is warm and dry.     Capillary Refill: Capillary refill takes less than 2 seconds.  Neurological:     General: No focal deficit present.     Mental Status: She is alert. Mental status is at baseline.  Psychiatric:        Mood and Affect: Mood normal.     (all labs ordered are listed, but only abnormal results are displayed) Labs Reviewed  BASIC METABOLIC PANEL WITH GFR - Abnormal; Notable for the following components:      Result Value   Glucose, Bld 106 (*)    Creatinine, Ser 1.08 (*)    GFR, Estimated 59 (*)    All other components within normal limits  CBC - Abnormal; Notable for the following components:   RBC 3.84 (*)    MCV 101.0 (*)    All other components within normal limits  RESP PANEL BY RT-PCR (RSV, FLU A&B, COVID)  [id number]  TROPONIN I (HIGH SENSITIVITY)  TROPONIN I (HIGH SENSITIVITY)    EKG: None  Radiology: DG Chest 2  View Result Date: 11/02/2024 EXAM: 2 VIEW(S) XRAY OF THE CHEST 11/02/2024 01:56:00 PM COMPARISON: 08/27/2024 CLINICAL HISTORY: sob FINDINGS: LUNGS AND PLEURA: No focal pulmonary opacity. No pulmonary edema. No pleural effusion. No pneumothorax. HEART AND MEDIASTINUM: No acute abnormality of the cardiac and mediastinal silhouettes. BONES AND SOFT TISSUES: No acute osseous abnormality. IMPRESSION: 1. No acute cardiopulmonary process. Electronically signed by: Lynwood Seip MD 11/02/2024 02:21 PM EST RP Workstation:  [id number]     Procedures   Medications Ordered in the ED - No data to display                                  Medical Decision Making Risk OTC drugs. Prescription drug management.   Prior to assessment triage placed orders for BMP, CBC, troponin all secondary to complaints of chest discomfort.  Also had EKG, chest x-ray, and secondary to nasal congestion as had nasopharyngeal swab for rule out of RSV/flu/COVID.  On reassessment of the lab work obtained, she has a normal hemoglobin with elevated MCV consistent with previous diagnosis of microcytic anemia.  Troponin is within normal limits and unchanged over a 2-hour period.  Nasopharyngeal swab for viral etiology is negative for flu/COVID/RSV.  Chest x-ray is unremarkable findings as previously noted.  EKG shows normal sinus rhythm and is unconcerning at this time for cardiac etiology.  Given normal EKG, normal chest x-ray, and normal appearing cardiac troponins, this is inconsistent with cardiac etiology.  Further does not appear that findings are consistent with pulmonary etiology as there is no concerning findings on the chest x-ray and auscultation of the lungs does not reveal any adventitious sounds.  Given the large volume of clear rhinorrhea and congestion and there is, feel this is likely secondary to a viral URI however due to the duration of symptoms and increased pressure and pain in the frontal and maxillary sinuses, we  will manage with amoxicillin  as possible sinusitis along with cough medication to be prescribed and a course of prednisone .  This was thoroughly discussed with the patient, she understands she agrees that she has no further concerns at this time.  Encouraged follow-up with primary care within the next week to 2 weeks, continue to use other previously prescribed medications, and follow-up to the ED should she have any further serious concerns.  Care plan thoroughly discussed with patient, she understands and agrees has no further concerns at this     Final diagnoses:  Viral URI with cough  Acute non-recurrent maxillary sinusitis    ED Discharge Orders          Ordered    amoxicillin  (AMOXIL ) 500 MG capsule  3 times daily        11/02/24 2138    pseudoephedrine-codeine-guaifenesin  (MYTUSSIN DAC) 30-10-100 MG/5ML solution  4 times daily PRN        11/02/24 2138    predniSONE  (STERAPRED UNI-PAK 21 TAB) 10 MG (21) TBPK tablet  Daily        11/02/24 2138               Myriam Dorn BROCKS, [place] 11/03/24 9972    Zackowski, Scott, MD 11/06/24 1424

## 2024-11-04 ENCOUNTER — Encounter

## 2024-11-04 ENCOUNTER — Other Ambulatory Visit

## 2024-11-10 ENCOUNTER — Ambulatory Visit (INDEPENDENT_AMBULATORY_CARE_PROVIDER_SITE_OTHER)

## 2024-11-10 DIAGNOSIS — I10 Essential (primary) hypertension: Secondary | ICD-10-CM

## 2024-11-10 MED ORDER — METOPROLOL SUCCINATE ER 25 MG PO TB24: 25.0000 mg | ORAL_TABLET | Freq: Every day | ORAL | 3 refills | Status: AC

## 2024-11-10 NOTE — Progress Notes (Signed)
 11/10/2024 Name: [name] MRN: [id number] DOB: [birth year]  Chief Complaint  Patient presents with   Hypertension    [name] is a 58 y.o. year old female who was referred for medication management by their primary care provider, [name], DO. They were scheduled for a face-to-face visit, but did not show and she was willing to switch to a telephone appointment.   They were referred to the pharmacist by their PCP for assistance in managing hypertension . PMH includes HTN, HFmrEF with mitral valve insufficiency (Echo June 2025 with EF 50-55%, global hypokinesis, G2DD, RV normal, moderate MV regurgitation), goiter, OA, tobacco use, BMI > 30.   Subjective: Patient was seen by PCP, [name], DO, on 09/20/24. At last visit, initial BP was 178/110 mmHg, 172/100 mmHg on recheck. She had recently been switched from lisinopril  to irbesartan  300 mg daily. She had not picked up irbesartan  yet. She also had smoked a cigarette before coming in. She was instructed to monitor blood pressure at home and return in ~1 mo for pharmacy follow-up and BMP. She also expressed interest in quitting smoking and was initiated on varenicline . She requested to reschedule pharmacy appt on 10/06/24. She came in for an acute visit on 10/18/24 and saw [name]. BP was improved at 128/91 mmHg, HR 90. She went to the ED for a viral UTI on 11/02/24, BMP at that time demonstrated stable Scr and K after switching to irbesartan .  Today, patient reports not feeling well. States she still has s/sx of a sinus infection. She was recently seen by ENT who is treating her with clindamycin  300 mg TID with meals x 10 days. The only medication she has that she has been taking is lisinopril  (not irbesartan ), metoprolol  as needed, and furosemide  as needed. She did need to take metoprolol  yesterday for heart racing. Needs a refill, still has her bottle that wast last filled in August 2024. She has Chantix  and  states that she planned to start taking on December 5th.    Care Team: Primary Care Provider: [name], DO ; Next Scheduled Visit: needs to be scheduled [name] or [name]) Cardiologist: Dr. Verlin; Next Scheduled Visit: no showed on 08/15/24 and 08/24/24   Medication Access/Adherence  Current Pharmacy:  [id number] [address], [place] - [address] [facility] [address] [place] [address] Phone: [phone] Fax: [phone]  [facility] - [facility] [address], [address] [place] [address] Phone: [phone] Fax: [phone]   Patient reports affordability concerns with their medications: No  Patient reports access/transportation concerns to their pharmacy: No  Patient reports adherence concerns with their medications:  Yes  - not taking irbesartan  as previously instructed   Hypertension:  Current medications: irbesartan  300 mg daily (filled 09/05/24, patient confirms she does NOT have this medication and is taking lisinopril  20 mg daily instead), metoprolol  succinate 25 mg daily + 1 extra tablet as needed for heart racing (filled 07/27/23 for 90ds, patient is taking as needed last dose yesterday), furosemide  40 mg daily (filled 03/10/24 for 90ds, taking as needed, cannot recall last dose)  Medications previously tried: amlodipine (dizziness), lisinopril  (possible cough)  Patient does not have a validated, automated, upper arm home BP cuff Current blood pressure readings readings: n/a - reports she would be able to purchase a cuff with her U-Card at the pharmacy  Patient reports hypotensive s/sx including dizziness, lightheadedness.  Reports she had a fall Thursday. Reports her balance is off, has dizziness with standing - reports this has happened 2 times over the past week. Feels it is likely related to her current illness.  Patient reports hypertensive symptoms  including headache. Reports chest pain, shortness of breath while she has been sick. Thinks it is related to her cough. Denies issues with swelling, edema.  Tobacco Abuse:  Current medications: varenicline starter pack - has not started yet, planning to start 11/25/24  Objective:  BP Readings from Last 3 Encounters:  11/02/24 (!) 197/80  10/18/24 (!) 128/91  09/20/24 (!) 172/100    Lab Results  Component Value Date   HGBA1C 5.3 09/02/2023       Latest Ref Rng & Units 11/02/2024    2:02 PM 08/27/2024    9:06 AM 02/08/2024    9:16 AM  BMP  Glucose 70 - 99 mg/dL 893  98  91   BUN 6 - 20 mg/dL 12  13  18    Creatinine 0.44 - 1.00 mg/dL 8.91  8.79  8.93   Sodium 135 - 145 mmol/L 138  139  138   Potassium 3.5 - 5.1 mmol/L 4.1  4.5  4.3   Chloride 98 - 111 mmol/L 105  106  105   CO2 22 - 32 mmol/L 23   27   Calcium 8.9 - 10.3 mg/dL 8.9   9.1     Lab Results  Component Value Date   CHOL 113 10/18/2009   HDL 54 10/18/2009   LDLCALC 52 10/18/2009   TRIG 36 10/18/2009   CHOLHDL 2.1 Ratio 10/18/2009    Medications Reviewed Today     Reviewed by Brinda Lorain SQUIBB, RPH (Pharmacist) on 11/10/24 at 1322  Med List Status: <None>   Medication Order Taking? Sig Documenting Provider Last Dose Status Informant  albuterol  (VENTOLIN  HFA) 108 (90 Base) MCG/ACT inhaler [id number]  Inhale 1-2 puffs into the lungs every 6 (six) hours as needed for wheezing or shortness of breath. Elnor Bernarda SQUIBB, DO  Active    Patient not taking:   Discontinued 11/10/24 1321 (Completed Course)   benzonatate  (TESSALON ) 100 MG capsule [id number]  Take 1 capsule (100 mg total) by mouth 3 (three) times daily for up to 14 days as needed for cough. Amilibia, Jaden, DO  Active   clindamycin  (CLEOCIN ) 300 MG capsule [id number] Yes Take one capsule three times daily with meals for 10 days. [provider]  Active   fluticasone  (FLONASE ) 50 MCG/ACT nasal spray [id number]  PLACE 2 SPRAYS INTO BOTH NOSTRILS DAILY Amilibia,  Jaden, DO  Active   furosemide  (LASIX ) 40 MG tablet [id number] Yes Take 1 tablet (40 mg total) by mouth daily.  Patient taking differently: Take 1 tablet (40 mg total) by mouth daily.   Verlin Lonni BIRCH, MD  Active   hydrocortisone  2.5 % cream [id number]  Apply topically 2 (two) times daily. Amilibia, Jaden, DO  Active   irbesartan  (AVAPRO ) 300 MG tablet [id number]  Take 1 tablet (300 mg total) by mouth daily.  Patient not taking: Reported on 11/10/2024   Amilibia, Jaden, DO  Active   metoprolol  succinate (TOPROL -XL) 25 MG 24 hr tablet [id number]  Take 1 tablet (25 mg total) by mouth daily. Tobie Gaines, DO  Active   ondansetron  (ZOFRAN ) 4 MG tablet [id number]  Take 1 tablet (4 mg total) by mouth every 6 (six) hours. Elnor Bernarda P, DO  Active   predniSONE  (STERAPRED UNI-PAK 21 TAB) 10 MG (  21) TBPK tablet [id number] Yes Take by mouth daily. Take 6 tabs by mouth daily  for 2 days, then 5 tabs for 2 days, then 4 tabs for 2 days, then 3 tabs for 2 days, 2 tabs for 2 days, then 1 tab by mouth daily for 2 days Myriam Dorn BROCKS, PA  Active   pseudoephedrine-codeine-guaifenesin  (MYTUSSIN DAC) 30-10-100 MG/5ML solution [id number]  Take 10 mLs by mouth 4 (four) times daily as needed for cough. Myriam Dorn BROCKS, PA  Active   topiramate  (TOPAMAX ) 25 MG tablet [id number]  Take 2 tablets (50 mg total) by mouth at bedtime.  Patient not taking: Reported on 11/10/2024   Leigh Venetia CROME, MD  Active   Varenicline Tartrate, Starter, (CHANTIX STARTING MONTH PAK) 0.5 MG X 11 & 1 MG X 42 TBPK [id number]  Take 1 mg by mouth 2 (two) times daily.  Patient not taking: Reported on 11/10/2024   Tobie Gaines, DO  Active   Vibegron  75 MG TABS [id number]  Take 1 tablet (75 mg total) by mouth daily.  Patient not taking: Reported on 11/10/2024   Zuleta, Kaitlin G, NP  Active               Assessment/Plan:   Hypertension: - Currently uncontrolled with clinic BP consistently above goal less than 130/80, primarily due  to medication nonadherence. Agree with switching to irbesartan , but will wait until patient presents in person with her medications to make this adjustment as it appears she is easily confused with medication changes. Appropriate to refill metoprolol , which she takes for palpitations/SVT. She is currently having symptoms that could be suggestive of hypo- or hypertension but she is also dealing with an ongoing sinus infection. Will reevaluate once her condition has improved. If additional control is needed, would be appropriate to retrial low-dose amlodipine (hx of dizziness with higher doses) or start MRA given hx of mildly reduced EF. - Reviewed long term cardiovascular and renal outcomes of uncontrolled blood pressure - Reviewed appropriate blood pressure monitoring technique and reviewed goal blood pressure. Recommended to obtain BP cuff from pharmacy using U-Card funds and check home blood pressure and heart rate  once daily and keep a log to bring to upcoming appointments - Recommend to continue lisinopril  20 mg daily until she can bring all her medications in for review, at that time we can try to switch her back to irbesartan  300 mg daily.  - Recommend to start taking metoprolol  succinate 25 mg daily. Collaborated with PCP to provide refills. - Patient is due for UACR to assess for microalbuminuria and kidney disease with longstanding HTN    Tobacco Abuse - Currently uncontrolled. Patient has plan to start Chantix next month. Will follow-up at in-office visit. - Provided motivational interviewing to assess tobacco use and strategies for reduction   Hyperlipidemia/ASCVD Risk Reduction: - Patient is due for repeat lipid panel   Written patient instructions provided. Patient verbalized understanding of treatment plan.   Follow Up Plan:  Pharmacist in person 11/28/24 PCP clinic visit needs to be scheduled   Lorain Baseman, PharmD [facility] [phone]
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
## 2024-11-23 ENCOUNTER — Ambulatory Visit: Admitting: Neurology

## 2024-11-28 ENCOUNTER — Ambulatory Visit

## 2024-11-28 DIAGNOSIS — I10 Essential (primary) hypertension: Secondary | ICD-10-CM

## 2024-11-28 NOTE — Progress Notes (Signed)
 11/28/2024 Name: [name] MRN: [id number] DOB: [birth year]  Chief Complaint  Patient presents with   Hypertension    [name] is a 58 y.o. year old female who was referred for medication management by their primary care provider, Janet Gee, DO. They were scheduled for a face-to-face visit, but did not show and she was willing to switch to a telephone appointment. Still have been unable to complete in-person med review with medication bottles.   They were referred to the pharmacist by their PCP for assistance in managing hypertension . PMH includes HTN, HFmrEF with mitral valve insufficiency (Echo June 2025 with EF 50-55%, global hypokinesis, G2DD, RV normal, moderate MV regurgitation), goiter, OA, tobacco use, BMI > 30.   Subjective: Patient was seen by PCP, Janet Blanch, DO, on 09/20/24. At last visit, initial BP was 178/110 mmHg, 172/100 mmHg on recheck. She had recently been switched from lisinopril  to irbesartan  300 mg daily. She had not picked up irbesartan  yet. She also had smoked a cigarette before coming in. She was instructed to monitor blood pressure at home and return in ~1 mo for pharmacy follow-up and BMP. She also expressed interest in quitting smoking and was initiated on varenicline . She requested to reschedule pharmacy appt on 10/06/24. She came in for an acute visit on 10/18/24 and saw [name]. BP was improved at 128/91 mmHg, HR 90. She went to the ED for a viral UTI on 11/02/24, BMP at that time demonstrated stable Scr and K after switching to irbesartan . At pharmacy telephone call on 11/10/24, patient continued to feel poorly with sinus infection. She reported that she was taking lisinopril  20 mg once daily (not irbesartan  300 mg daily as previously prescribed. Assisted in refilling metoprolol  and encouraged her to take once daily. Encouraged her to start Chantix  next month, per preference. Encouraged her to purchase BP cuff using U-Card funds.    Today, patient reports she is doing ok. Regimen appears unchanged from previous call. Does not appear that patient picked up refill of metoprolol  yet. States she just noticed her bottle of lisinopril  says to take 20 mg TWICE daily. She has only been taking once daily. She states she would be willing to be scheduled for an in person appt, but she continues to have difficulty keeping the appt time.   Care Team: Primary Care Provider: [name], DO ; Next Scheduled Visit: needs to be scheduled [name] or [name]) Cardiologist: [name]; Next Scheduled Visit: no showed on 08/15/24 and 08/24/24   Medication Access/Adherence  Current Pharmacy:  [id number] [place], [place] - [address] AT [facility] [address] [place] [address] Phone: [phone] Fax: [phone]  [facility] [address], [address] [place] [address] Phone: [phone] Fax: [phone]   Patient reports affordability concerns with their medications: No  Patient reports access/transportation concerns to their pharmacy: No  Patient reports adherence concerns with their medications:  Yes  - not taking irbesartan  as previously instructed   Hypertension:  Current medications: irbesartan  300 mg daily (filled 09/05/24 for 30ds, patient confirms she does NOT have this medication and is taking lisinopril  20 mg daily instead - which she plans to increase to 20 mg BID per medication instructions), metoprolol  succinate 25 mg daily (filled 07/27/23 for 90ds, still taking as needed for heart racing) , furosemide  40 mg daily (filled 03/10/24 for 90ds, taking as needed,  cannot recall last dose)  Medications previously tried: amlodipine (dizziness), lisinopril  (possible cough)  Patient does not have a validated, automated, upper arm home BP cuff Current blood pressure readings readings: n/a - reports she  would be able to purchase a cuff with her U-Card at the pharmacy, but upon further investigation states she does not think this will be affordable for her.  Patient denies hypotensive s/sx including dizziness, lightheadedness. Still has sinus infection that was causing dizziness, but states she has improved a bit.  Patient denies hypertensive symptoms including headache. Denies issues with swelling, edema.  Tobacco Abuse:  Current medications: varenicline  starter pack - was planning to start 11/25/24 (did not confirm today)  Objective:  BP Readings from Last 3 Encounters:  11/02/24 (!) 197/80  10/18/24 (!) 128/91  09/20/24 (!) 172/100    Lab Results  Component Value Date   HGBA1C 5.3 09/02/2023       Latest Ref Rng & Units 11/02/2024    2:02 PM 08/27/2024    9:06 AM 02/08/2024    9:16 AM  BMP  Glucose 70 - 99 mg/dL 893  98  91   BUN 6 - 20 mg/dL 12  13  18    Creatinine 0.44 - 1.00 mg/dL 8.91  8.79  8.93   Sodium 135 - 145 mmol/L 138  139  138   Potassium 3.5 - 5.1 mmol/L 4.1  4.5  4.3   Chloride 98 - 111 mmol/L 105  106  105   CO2 22 - 32 mmol/L 23   27   Calcium 8.9 - 10.3 mg/dL 8.9   9.1     Lab Results  Component Value Date   CHOL 113 10/18/2009   HDL 54 10/18/2009   LDLCALC 52 10/18/2009   TRIG 36 10/18/2009   CHOLHDL 2.1 Ratio 10/18/2009    Medications Reviewed Today     Reviewed by [name], RPH-CPP (Pharmacist) on 11/28/24 at 1601  Med List Status: <None>   Medication Order Taking? Sig Documenting Provider Last Dose Status Informant  albuterol  (VENTOLIN  HFA) 108 (90 Base) MCG/ACT inhaler [id number]  Inhale 1-2 puffs into the lungs every 6 (six) hours as needed for wheezing or shortness of breath. [name] P, DO  Active   benzonatate  (TESSALON ) 100 MG capsule [id number]  Take 1 capsule (100 mg total) by mouth 3 (three) times daily for up to 14 days as needed for cough. Lambert, Jaden, DO  Active   fluticasone  (FLONASE ) 50 MCG/ACT nasal spray [id number]   PLACE 2 SPRAYS INTO BOTH NOSTRILS DAILY Lambert, Jaden, DO  Active   furosemide  (LASIX ) 40 MG tablet [id number]  Take 1 tablet (40 mg total) by mouth daily.  Patient taking differently: Take 1 tablet (40 mg total) by mouth daily.   Lambert Lonni BIRCH, MD  Active   irbesartan  (AVAPRO ) 300 MG tablet [id number]  Take 1 tablet (300 mg total) by mouth daily.  Patient not taking: Reported on 11/28/2024   Lambert, Jaden, DO  Active   metoprolol  succinate (TOPROL -XL) 25 MG 24 hr tablet [id number] Yes Take 1 tablet (25 mg total) by mouth daily.  Patient taking differently: Take 1 tablet (25 mg total) by mouth daily.   Tobie Gaines, DO  Active   ondansetron  (ZOFRAN ) 4 MG tablet [id number]  Take 1 tablet (4 mg total) by mouth every 6 (six) hours. [name] SQUIBB, DO  Active     Discontinued 11/28/24 1601 (Completed Course)   pseudoephedrine -codeine -guaifenesin  (MYTUSSIN DAC) 30-10-100 MG/5ML solution  [id number]  Take 10 mLs by mouth 4 (four) times daily as needed for cough. Myriam Dorn BROCKS, PA  Active   topiramate  (TOPAMAX ) 25 MG tablet [id number]  Take 2 tablets (50 mg total) by mouth at bedtime.  Patient not taking: Reported on 11/10/2024   Leigh Venetia CROME, MD  Active   Varenicline  Tartrate, Starter, (CHANTIX  STARTING MONTH PAK) 0.5 MG X 11 & 1 MG X 42 TBPK [id number]  Take 1 mg by mouth 2 (two) times daily.  Patient not taking: Reported on 11/10/2024   Tobie Gaines, DO  Active   Vibegron  75 MG TABS [id number]  Take 1 tablet (75 mg total) by mouth daily.  Patient not taking: Reported on 11/10/2024   Zuleta, Kaitlin G, NP  Active               Assessment/Plan:   Hypertension: - Currently uncontrolled with clinic BP consistently above goal less than 130/80, primarily due to medication nonadherence. Agree with switching from lisinpril 20 mg BID to irbesartan  300 mg daily, but will wait until patient presents in person with her medications to make this adjustment as it appears she is easily  confused with medication changes. Again, encouraged once daily dosing of metoprolol . Anticipate she may need additional control with low-dose amlodipine 2.5 mg daily (hx of dizziness with higher doses) or MRA given hx of mildly reduced EF (though concerned about appropriate lab follow-up).  - Reviewed long term cardiovascular and renal outcomes of uncontrolled blood pressure - Reviewed appropriate blood pressure monitoring technique and reviewed goal blood pressure. Recommended to obtain BP cuff from pharmacy using U-Card funds if affordable and check home blood pressure and heart rate  once daily and keep a log to bring to upcoming appointments. Alternatively, can dispense patient BP cuff at [facility] clinic reserved for patients that are not able to afford a blooe pressure cuff. - Recommend to continue lisinopril  20 mg BID until she can bring all her medications in for review, at that time we can try to switch her back to irbesartan  300 mg daily.  - Recommend to start taking metoprolol  succinate 25 mg daily - Recommend to add amlodipine 2.5 mg daily at PCP f/u if BP is >20/10 mmHg above goal - Patient is due for UACR to assess for microalbuminuria and kidney disease with longstanding HTN  Tobacco Abuse - Currently uncontrolled. Patient was previously planning to start Chantix  11/25/24, but did not confirm today. Will follow-up at in-office visit.   Hyperlipidemia/ASCVD Risk Reduction: - Patient is due for repeat lipid panel   Written patient instructions provided. Patient verbalized understanding of treatment plan.   Follow Up Plan:  Pharmacist telephone 12/26/24 PCP clinic visit scheduled today for 12/05/24   Lorain Baseman, PharmD [facility] [phone]

## 2024-11-29 ENCOUNTER — Ambulatory Visit
Admission: RE | Admit: 2024-11-29 | Discharge: 2024-11-29 | Disposition: A | Source: Ambulatory Visit | Attending: Internal Medicine

## 2024-11-29 DIAGNOSIS — N644 Mastodynia: Secondary | ICD-10-CM

## 2024-12-02 ENCOUNTER — Telehealth: Payer: Self-pay | Admitting: Pharmacy Technician

## 2024-12-02 NOTE — Progress Notes (Addendum)
° °  ° °  12/02/2024 Name: [name] MRN: [id number] DOB: [birth year]  Patient engaged with clinical pharmacist for management of hypertension on 11/28/2024. Outreach by [employer] technician was requested.   Outreached patient to discuss hypertension medication management. Left voicemail for patient to return my call at their convenience.   ADDENDUM 12/02/2024 1:34PM  Outreached patient again. Patient answered the call. HIPAA verified.  Patient is appearing for a follow-up visit with the population health pharmacy technician. Last engaged with the clinical pharmacist to discuss hypertension on 11/28/2024. Contacted patient today to discuss and remind paitent of office visit on 12/05/24.SABRA   Plan from last clinical pharmacist appointment:  Hypertension: - Currently uncontrolled with clinic BP consistently above goal less than 130/80, primarily due to medication nonadherence. Agree with switching from lisinpril 20 mg BID to irbesartan  300 mg daily, but will wait until patient presents in person with her medications to make this adjustment as it appears she is easily confused with medication changes. Again, encouraged once daily dosing of metoprolol . Anticipate she may need additional control with low-dose amlodipine 2.5 mg daily (hx of dizziness with higher doses) or MRA given hx of mildly reduced EF (though concerned about appropriate lab follow-up).  - Reviewed long term cardiovascular and renal outcomes of uncontrolled blood pressure - Reviewed appropriate blood pressure monitoring technique and reviewed goal blood pressure. Recommended to obtain BP cuff from pharmacy using U-Card funds if affordable and check home blood pressure and heart rate  once daily and keep a log to bring to upcoming appointments. Alternatively, can dispense patient BP cuff at [facility] clinic reserved for patients that are not able to afford a blooe pressure cuff. - Recommend to continue lisinopril  20  mg BID until she can bring all her medications in for review, at that time we can try to switch her back to irbesartan  300 mg daily.  - Recommend to start taking metoprolol  succinate 25 mg daily - Recommend to add amlodipine 2.5 mg daily at PCP f/u if BP is >20/10 mmHg above goal - Patient is due for UACR to assess for microalbuminuria and kidney disease with longstanding HTN  Tobacco Abuse - Currently uncontrolled. Patient was previously planning to start Chantix  11/25/24, but did not confirm today. Will follow-up at in-office visit. Hyperlipidemia/ASCVD Risk Reduction: - Patient is due for repeat lipid panel -Written patient instructions provided. Patient verbalized understanding of treatment plan.  -Follow Up Plan:  Pharmacist telephone 12/26/24 PCP clinic visit scheduled today for 12/05/24(copy/paste from last note)     Reminded patient of date/time of upcoming clinical pharmacist follow up and any upcoming PCP/specialists visits. Patient denies transportation barriers to the appointment. Yes Patient was reminded to bring all medications to doctor's appointment Patient was reminded to take blood pressure medications at least 1 hour before doctor's appointment Patient verbalized understanding.  Next clinical pharmacist appointment is scheduled for: 12/26/2024   Caydn Justen, CPhT [facility] Office: [phone] Email: [email or web address]

## 2024-12-05 ENCOUNTER — Ambulatory Visit: Admitting: Student

## 2024-12-05 NOTE — Progress Notes (Deleted)
 11/10/2024-DrSABRA Baseman Hypertension: - Currently uncontrolled with clinic BP consistently above goal less than 130/80, primarily due to medication nonadherence. Agree with switching to irbesartan , but will wait until patient presents in person with her medications to make this adjustment as it appears she is easily confused with medication changes. Appropriate to refill metoprolol , which she takes for palpitations/SVT. She is currently having symptoms that could be suggestive of hypo- or hypertension but she is also dealing with an ongoing sinus infection. Will reevaluate once her condition has improved. If additional control is needed, would be appropriate to retrial low-dose amlodipine (hx of dizziness with higher doses) or start MRA given hx of mildly reduced EF. - Reviewed long term cardiovascular and renal outcomes of uncontrolled blood pressure - Reviewed appropriate blood pressure monitoring technique and reviewed goal blood pressure. Recommended to obtain BP cuff from pharmacy using U-Card funds and check home blood pressure and heart rate  once daily and keep a log to bring to upcoming appointments - Recommend to continue lisinopril  20 mg daily until she can bring all her medications in for review, at that time we can try to switch her back to irbesartan  300 mg daily.  - Recommend to start taking metoprolol  succinate 25 mg daily. Collaborated with PCP to provide refills. - Patient is due for UACR to assess for microalbuminuria and kidney disease with longstanding HTN       Tobacco Abuse - Currently uncontrolled. Patient has plan to start Chantix  next month. Will follow-up at in-office visit. - Provided motivational interviewing to assess tobacco use and strategies for reduction     Hyperlipidemia/ASCVD Risk Reduction: - Patient is due for repeat lipid panel    CC: Routine Follow Up for hypertension after last office visit 11/10/2024  HPI:  [name] is a 58 y.o. female with  pertinent PMH of hypertension, HFrEF, tobacco use disorder, and class III obesity who presents as above. Please see assessment and plan below for further details.  Medications: Current Outpatient Medications  Medication Instructions   albuterol  (VENTOLIN  HFA) 108 (90 Base) MCG/ACT inhaler 1-2 puffs, Inhalation, Every 6 hours PRN   benzonatate  (TESSALON ) 100 MG capsule Take 1 capsule (100 mg total) by mouth 3 (three) times daily for up to 14 days as needed for cough.   fluticasone  (FLONASE ) 50 MCG/ACT nasal spray 2 sprays, Each Nare, Daily   furosemide  (LASIX ) 40 mg, Oral, Daily   lisinopril  (ZESTRIL ) 20 mg, 2 times daily   metoprolol  succinate (TOPROL -XL) 25 mg, Oral, Daily   ondansetron  (ZOFRAN ) 4 mg, Oral, Every 6 hours   pseudoephedrine -codeine -guaifenesin  (MYTUSSIN DAC) 30-10-100 MG/5ML solution 10 mLs, Oral, 4 times daily PRN   topiramate  (TOPAMAX ) 50 mg, Oral, Daily at bedtime   Varenicline  Tartrate (Starter) (CHANTIX  STARTING MONTH PAK) 1 mg, Oral, 2 times daily   Vibegron  75 mg, Oral, Daily     Review of Systems:   Pertinent items noted in HPI and/or A&P.  Physical Exam:  There were no vitals filed for this visit.  Constitutional:***. In no acute distress. HEENT: Normocephalic, atraumatic, Sclera non-icteric, PERRL, EOM intact Cardio:Regular rate and rhythm. 2+ bilateral {PulseLoc:28294} pulses. Pulm:Clear to auscultation bilaterally. Normal work of breathing on room air. Abdomen: Soft, non-tender, non-distended, positive bowel sounds. FDX:Wzhjupcz for extremity edema. Skin:Warm and dry. Neuro:Alert and oriented x3. No focal deficit noted. Psych:Pleasant mood and affect.   Assessment & Plan:   Assessment & Plan   No orders of the defined types were placed in this encounter.  No follow-ups on file.   Patient {GC/GE:[id number]::discussed with,seen with} {JGIMTSattending2025/2026:32954}  Fairy Pool, DO [facility] Internal Medicine Resident  PGY-3 Clinic Phone: [phone] Please contact the on call pager at [phone] for any urgent or emergent needs.

## 2024-12-26 ENCOUNTER — Other Ambulatory Visit (INDEPENDENT_AMBULATORY_CARE_PROVIDER_SITE_OTHER)

## 2024-12-26 DIAGNOSIS — E785 Hyperlipidemia, unspecified: Secondary | ICD-10-CM

## 2024-12-26 DIAGNOSIS — I10 Essential (primary) hypertension: Secondary | ICD-10-CM

## 2024-12-26 DIAGNOSIS — Z720 Tobacco use: Secondary | ICD-10-CM

## 2024-12-26 NOTE — Progress Notes (Signed)
 "  12/26/2024 Name: [name] MRN: [id number] DOB: 21-Jan-1966  Chief Complaint  Patient presents with   Hypertension    [name] is a 59 y.o. year old female who was referred for medication management by their primary care provider, Janet Gee, DO. They were scheduled for a telephone visit due to transportation barriers.   They were referred to the pharmacist by their PCP for assistance in managing hypertension . PMH includes HTN, HFmrEF with mitral valve insufficiency (Echo June 2025 with EF 50-55%, global hypokinesis, G2DD, RV normal, moderate MV regurgitation), goiter, OA, tobacco use, BMI > 30.   Subjective: Patient was seen by PCP, Janet Blanch, DO, on 09/20/24. At last visit, initial BP was 178/110 mmHg, 172/100 mmHg on recheck. She had recently been switched from lisinopril  to irbesartan  300 mg daily. She had not picked up irbesartan  yet. She also had smoked a cigarette before coming in. She was instructed to monitor blood pressure at home and return in ~1 mo for pharmacy follow-up and BMP. She also expressed interest in quitting smoking and was initiated on varenicline . She requested to reschedule pharmacy appt on 10/06/24. She came in for an acute visit on 10/18/24 and saw [name]. BP was improved at 128/91 mmHg, HR 90. She went to the ED for a viral UTI on 11/02/24, BMP at that time demonstrated stable Scr and K after switching to irbesartan . At pharmacy telephone call on 11/10/24, patient continued to feel poorly with sinus infection. She reported that she was taking lisinopril  20 mg once daily (not irbesartan  300 mg daily as previously prescribed. Assisted in refilling metoprolol  and encouraged her to take once daily. Encouraged her to start Chantix  next month, per preference. Encouraged her to purchase BP cuff using [employer] funds. At pharmacy call on 11/28/24 (patient was not able to come in person) reviewed adherence. She noted lisinopril  bottle said she could  take 20 mg BID, so she was encouraged to take this dose along with metoprolol  25 mg daily. She did not show to PCP appt scheduled for 12/05/24.  Today, patient reports she has been taking lisinopril  20 mg BID since our last appt but she would like to go back to once daily because she feels it is causing headaches and lower limb swelling. Unclear whether she is taking metoprolol  daily. Says she has not started Chantix  for smoking cessation but has it at home. Says we can schedule an in person appt this week for a full in person med review. She also will need assistance obtaining a blood pressure cuff.   Care Team: Primary Care Provider: [name], DO ; Next Scheduled Visit: needs to be scheduled [name] or [name]) - did not show on 12/05/24. Cardiologist: Dr. Verlin; Next Scheduled Visit: no showed on 08/15/24 and 08/24/24   Medication Access/Adherence  Current Pharmacy:  [facility] #[id number] [address], [place] - [address] AT [facility] [address] [place] [address] Phone: [phone] Fax: [phone]  [facility] - [facility] [address], [address] [place] [address] Phone: [phone] Fax: [phone]   Patient reports affordability concerns with their medications: No  Patient reports access/transportation concerns to their pharmacy: No  Patient reports adherence concerns with their medications:  Yes  - not taking irbesartan  as previously instructed   Hypertension:  Current medications: lisinopril  20 mg BID (would like to switch to once daily due to headaches, LE swelling), metoprolol   succinate 25 mg daily (filled 07/27/23 for 90ds, unclear whether she is currently taking daily or as needed) , furosemide  40 mg daily (filled 03/10/24 for 90ds, taking as needed, cannot recall last dose)  Medications previously tried: amlodipine (dizziness), lisinopril  (possible cough)  Patient  does not have a validated, automated, upper arm home BP cuff Current blood pressure readings readings: n/a - reports she would be able to purchase a cuff with her U-Card at the pharmacy, but is still concerned about the cost. Agreeable to coming in person to obtain a free BP device.   The patient has a diagnosis of hypertension and it is medically necessary for them to have access to a home device to monitor blood pressure. The patient does not have readily available insurance access to a device and cannot afford to purchase a device at this time. The patient has been counseled that they do not need to continue to receive services from [facility] for receive a device. The patient will be given a device free of charge.   Patient denies hypotensive s/sx including dizziness, lightheadedness.   Patient denies hypertensive symptoms including headache. Endorses recent increased LE swelling in her feet and ankles.   Tobacco Abuse:  Current medications: varenicline  starter pack - still has not started, was worried about worsening HA and swelling  Objective:  BP Readings from Last 3 Encounters:  11/02/24 (!) 197/80  10/18/24 (!) 128/91  09/20/24 (!) 172/100    Lab Results  Component Value Date   HGBA1C 5.3 09/02/2023       Latest Ref Rng & Units 11/02/2024    2:02 PM 08/27/2024    9:06 AM 02/08/2024    9:16 AM  BMP  Glucose 70 - 99 mg/dL 893  98  91   BUN 6 - 20 mg/dL 12  13  18    Creatinine 0.44 - 1.00 mg/dL 8.91  8.79  8.93   Sodium 135 - 145 mmol/L 138  139  138   Potassium 3.5 - 5.1 mmol/L 4.1  4.5  4.3   Chloride 98 - 111 mmol/L 105  106  105   CO2 22 - 32 mmol/L 23   27   Calcium 8.9 - 10.3 mg/dL 8.9   9.1     Lab Results  Component Value Date   CHOL 113 10/18/2009   HDL 54 10/18/2009   LDLCALC 52 10/18/2009   TRIG 36 10/18/2009   CHOLHDL 2.1 Ratio 10/18/2009    Medications Reviewed Today     Reviewed by Brinda Lorain SQUIBB, RPH-CPP (Pharmacist) on 12/26/24 at 1408  Med List  Status: <None>   Medication Order Taking? Sig Documenting Provider Last Dose Status Informant  albuterol  (VENTOLIN  HFA) 108 (90 Base) MCG/ACT inhaler [id number]  Inhale 1-2 puffs into the lungs every 6 (six) hours as needed for wheezing or shortness of breath. Elnor Hila P, DO  Active   benzonatate  (TESSALON ) 100 MG capsule [id number]  Take 1 capsule (100 mg total) by mouth 3 (three) times daily for up to 14 days as needed for cough. Amilibia, Jaden, DO  Active   fluticasone  (FLONASE ) 50 MCG/ACT nasal spray [id number]  PLACE 2 SPRAYS INTO BOTH NOSTRILS DAILY Amilibia, Jaden, DO  Active   furosemide  (LASIX ) 40 MG tablet [id number]  Take 1 tablet (40 mg total) by mouth daily. Verlin Lonni BIRCH, MD  Active   lisinopril  (ZESTRIL ) 20 MG tablet [id number] Yes Take 20 mg by mouth 2 (two) times daily. [provider]  Active  metoprolol  succinate (TOPROL -XL) 25 MG 24 hr tablet [id number]  Take 1 tablet (25 mg total) by mouth daily. Tobie Gaines, DO  Active   ondansetron  (ZOFRAN ) 4 MG tablet [id number]  Take 1 tablet (4 mg total) by mouth every 6 (six) hours. Elnor Bernarda SQUIBB, DO  Active   pseudoephedrine -codeine -guaifenesin  (MYTUSSIN DAC) 30-10-100 MG/5ML solution [id number]  Take 10 mLs by mouth 4 (four) times daily as needed for cough. Myriam Dorn BROCKS, PA  Active   topiramate  (TOPAMAX ) 25 MG tablet [id number]  Take 2 tablets (50 mg total) by mouth at bedtime.  Patient not taking: Reported on 11/10/2024   Leigh Venetia CROME, MD  Active   Vibegron  75 MG TABS [id number]  Take 1 tablet (75 mg total) by mouth daily.  Patient not taking: Reported on 11/10/2024   Zuleta, Kaitlin G, NP  Active               Assessment/Plan:   Hypertension: - Currently uncontrolled with clinic BP consistently above goal less than 130/80, primarily due to medication nonadherence. Hopefully patient will present in person this week with her medication bottles so we can make appropriate changes, such as stopping  lisinopril  and restarting irbesartan  as was previously attempted. Will follow-up whether. Anticipate she may need additional control with low-dose amlodipine 2.5 mg daily (hx of dizziness with higher doses) or MRA given hx of mildly reduced EF (though concerned about appropriate lab follow-up). Will follow-up on how she is taking metoprolol . If she is having frequent symptoms of heart racing she should be advised to take daily.  - Reviewed long term cardiovascular and renal outcomes of uncontrolled blood pressure - Reviewed appropriate blood pressure monitoring technique and reviewed goal blood pressure. Will assist in providing free BP cuff at in person follow-up on 12/29/24. - Recommend to continue lisinopril  20 mg BID until she can bring all her medications in for review, at that time we can try to switch her back to irbesartan  300 mg daily.  - Recommend to start taking metoprolol  succinate 25 mg daily - Recommend to add amlodipine 2.5 mg daily at PCP f/u if BP is >20/10 mmHg above goal - Patient is due for UACR to assess for microalbuminuria and kidney disease with longstanding HTN  Tobacco Abuse - Currently uncontrolled. Patient was previously planning to start Chantix  11/25/24, but held off given other ongoing symptoms. She is agreeable to discuss at in person visit.   Hyperlipidemia/ASCVD Risk Reduction: - Patient is due for repeat lipid panel   Written patient instructions provided. Patient verbalized understanding of treatment plan.   Follow Up Plan:  Pharmacist in person 12/29/24 PCP clinic visit needs to be scheduled   Lorain Baseman, PharmD [facility] [phone]  "

## 2024-12-27 NOTE — Progress Notes (Signed)
 Evaluation and management procedures were performed by the Clinical Pharmacy Practitioner under my supervision and collaboration. I have reviewed the Practitioner's note and chart, and I agree with the management and plan as documented above.    I agree with contingency plan to change Lisinopril  to Irbesartan  if patient brings in medicines.

## 2024-12-29 ENCOUNTER — Ambulatory Visit (INDEPENDENT_AMBULATORY_CARE_PROVIDER_SITE_OTHER): Payer: Self-pay

## 2024-12-29 VITALS — BP 167/114 | HR 71

## 2024-12-29 DIAGNOSIS — E785 Hyperlipidemia, unspecified: Secondary | ICD-10-CM | POA: Diagnosis not present

## 2024-12-29 DIAGNOSIS — F17200 Nicotine dependence, unspecified, uncomplicated: Secondary | ICD-10-CM

## 2024-12-29 DIAGNOSIS — I10 Essential (primary) hypertension: Secondary | ICD-10-CM | POA: Diagnosis not present

## 2024-12-29 MED ORDER — AMLODIPINE BESYLATE-VALSARTAN 5-160 MG PO TABS: 1.0000 | ORAL_TABLET | Freq: Every day | ORAL | 1 refills | Status: AC

## 2024-12-29 NOTE — Progress Notes (Signed)
 "  12/29/2024 Name: [name] MRN: [id number] DOB: [birth year]  Chief Complaint  Patient presents with   Hypertension    [name] is a 59 y.o. year old female who was referred for medication management by their primary care provider, Janet Gee, DO. They were scheduled for a telephone visit due to transportation barriers.   They were referred to the pharmacist by their PCP for assistance in managing hypertension . PMH includes HTN, HFmrEF with mitral valve insufficiency (Echo June 2025 with EF 50-55%, global hypokinesis, G2DD, RV normal, moderate MV regurgitation), goiter, OA, tobacco use, BMI > 30.   Subjective: Patient was seen by PCP, Janet Blanch, DO, on 09/20/24. At last visit, initial BP was 178/110 mmHg, 172/100 mmHg on recheck. She had recently been switched from lisinopril  to irbesartan  300 mg daily. She had not picked up irbesartan  yet. She also had smoked a cigarette before coming in. She was instructed to monitor blood pressure at home and return in ~1 mo for pharmacy follow-up and BMP. She also expressed interest in quitting smoking and was initiated on varenicline . She requested to reschedule pharmacy appt on 10/06/24. She came in for an acute visit on 10/18/24 and saw [name]. BP was improved at 128/91 mmHg, HR 90. She went to the ED for a viral UTI on 11/02/24, BMP at that time demonstrated stable Scr and K after switching to irbesartan . At pharmacy telephone call on 11/10/24, patient continued to feel poorly with sinus infection. She reported that she was taking lisinopril  20 mg once daily (not irbesartan  300 mg daily as previously prescribed. Assisted in refilling metoprolol  and encouraged her to take once daily. Encouraged her to start Chantix  next month, per preference. Encouraged her to purchase BP cuff using [employer] funds. At pharmacy call on 11/28/24 (patient was not able to come in person) reviewed adherence. She noted lisinopril  bottle said she could  take 20 mg BID, so she was encouraged to take this dose along with metoprolol  25 mg daily. She did not show to PCP appt scheduled for 12/05/24.   Today, patient presents in good spirits, with her medications. She has metoprolol  25 mg (very old bottle, only taking when she feels heart racing), furosemide  40 mg (only taking as needed for swelling), lisinopril  20 mg tabs (only took 1 tab yesterday, none today).   Care Team: Primary Care Provider: [name], DO ; Next Scheduled Visit: needs to be scheduled [name] or [name]) - did not show on 12/05/24. Cardiologist: Dr. Verlin; Next Scheduled Visit: no showed on 08/15/24 and 08/24/24  - encouraged to reschedule today  Medication Access/Adherence  Current Pharmacy:  [facility] #[id number] [address], [place] - [address] AT [facility] [address] [place] [address] Phone: [phone] Fax: [phone]  [facility] - [facility] [address], [address] [place] [address] Phone: [phone] Fax: [phone]   Patient reports affordability concerns with their medications: No  Patient reports access/transportation concerns to their pharmacy: No  Patient reports adherence concerns with their medications:  Yes  - not taking irbesartan  as previously instructed   Hypertension:  Current medications: lisinopril  20 mg BID (feels it causes headaches, LE swelling), metoprolol  succinate 25 mg daily (filled 07/27/23 for 90ds, only taking PRN , furosemide  40 mg daily (filled 03/10/24 for 90ds, only taking PRN, took a dose last week)  Medications previously tried: amlodipine  (dizziness) - patient does not recall this  reaction today, lisinopril  (possible cough)  Patient does not have a validated, automated, upper arm home BP cuff Current blood pressure readings readings: n/a   The patient has a diagnosis of hypertension and it is medically necessary for  them to have access to a home device to monitor blood pressure. The patient does not have readily available insurance access to a device and cannot afford to purchase a device at this time. The patient has been counseled that they do not need to continue to receive services from [facility] for receive a device. The patient will be given a device free of charge.   Patient denies hypotensive s/sx including dizziness, lightheadedness.   Patient denies hypertensive symptoms including headache. Endorses recent increased LE swelling in her feet and ankles. She does report ongoing palpitations and heart racing but has not been taking metoprolol  consistently.  Tobacco Abuse:  Current medications: varenicline  starter pack - still has not started, was worried about worsening HA and swelling  Interested in quitting smoking. States she does not smoke a full cigarette at a time, but does not state how many she goes through in one day.   Objective:  BP Readings from Last 3 Encounters:  12/29/24 (!) 167/114  11/02/24 (!) 197/80  10/18/24 (!) 128/91    Lab Results  Component Value Date   HGBA1C 5.3 09/02/2023       Latest Ref Rng & Units 11/02/2024    2:02 PM 08/27/2024    9:06 AM 02/08/2024    9:16 AM  BMP  Glucose 70 - 99 mg/dL 893  98  91   BUN 6 - 20 mg/dL 12  13  18    Creatinine 0.44 - 1.00 mg/dL 8.91  8.79  8.93   Sodium 135 - 145 mmol/L 138  139  138   Potassium 3.5 - 5.1 mmol/L 4.1  4.5  4.3   Chloride 98 - 111 mmol/L 105  106  105   CO2 22 - 32 mmol/L 23   27   Calcium 8.9 - 10.3 mg/dL 8.9   9.1     Lab Results  Component Value Date   CHOL 113 10/18/2009   HDL 54 10/18/2009   LDLCALC 52 10/18/2009   TRIG 36 10/18/2009   CHOLHDL 2.1 Ratio 10/18/2009    Medications Reviewed Today     Reviewed by Brinda Lorain SQUIBB, RPH-CPP (Pharmacist) on 12/29/24 at 1403  Med List Status: <None>   Medication Order Taking? Sig Documenting Provider Last Dose Status Informant  albuterol  (VENTOLIN   HFA) 108 (90 Base) MCG/ACT inhaler [id number] Yes Inhale 1-2 puffs into the lungs every 6 (six) hours as needed for wheezing or shortness of breath. Elnor Bernarda SQUIBB, DO  Active   amLODipine -valsartan  (EXFORGE ) 5-160 MG tablet [id number] Yes Take 1 tablet by mouth daily. Jeanelle Layman CROME, MD  Active     Discontinued 12/29/24 1200 (Completed Course)   fluticasone  (FLONASE ) 50 MCG/ACT nasal spray [id number]  PLACE 2 SPRAYS INTO BOTH NOSTRILS DAILY Amilibia, Jaden, DO  Active   furosemide  (LASIX ) 40 MG tablet [id number] Yes Take 1 tablet (40 mg total) by mouth daily. Verlin Lonni BIRCH, MD  Active    Discontinued 12/29/24 1211 (Change in therapy)   metoprolol  succinate (TOPROL -XL) 25 MG 24 hr tablet [id number] Yes Take 1 tablet (25 mg total) by mouth daily. Tobie Gaines, DO  Active   ondansetron  (ZOFRAN ) 4 MG tablet [id number]  Take 1 tablet (4 mg total) by mouth every 6 (six) hours. Elnor,  Bernarda SQUIBB, DO  Active   pseudoephedrine -codeine -guaifenesin  (MYTUSSIN DAC) 30-10-100 MG/5ML solution [id number]  Take 10 mLs by mouth 4 (four) times daily as needed for cough. Myriam Dorn BROCKS, PA  Active    Patient not taking:   Discontinued 12/29/24 1402 (Expired Prescription)   Vibegron  75 MG TABS [id number]  Take 1 tablet (75 mg total) by mouth daily.  Patient not taking: Reported on 11/10/2024   Zuleta, Kaitlin G, NP  Active               Assessment/Plan:   Hypertension: - Currently uncontrolled with clinic BP consistently above goal less than 130/80, primarily due to medication nonadherence. She had not taken her medication today. She is agreeable to switching lisinopril  to CCB-ARB combination pill. Documented dizziness many years ago to amlodipine , but patient does not recall this today. Reasonable to retrial. Advised to take metoprolol  daily as previously prescribed by cardiology for rare SVT and PVCs. - Reviewed long term cardiovascular and renal outcomes of uncontrolled blood pressure - Reviewed  appropriate blood pressure monitoring technique and reviewed goal blood pressure. Set up and demonstrated use of Omron cuff in clinic today. Provided with BP log.  - Recommend to stop lisinopril  and START amlodipine -valsartan  5-160 mg daily. Offered to deliver via [facility] but patient preferred to pick up from [facility] at this time. - Recommend BMP to monitor Scr and K in 3-4 weeks - Recommend to start taking metoprolol  succinate 25 mg daily as previously instructed - Patient is due for UACR to assess for microalbuminuria and kidney disease with longstanding HTN  Tobacco Abuse - Currently uncontrolled. Patient was previously planning to start Chantix  11/25/24, but held off given other ongoing symptoms. Encouraged patient to start taking and set quit date. Will continue to follow.    Hyperlipidemia/ASCVD Risk Reduction: - Patient is due for repeat lipid panel, will obtain next month with BMP   Written patient instructions provided. Patient verbalized understanding of treatment plan.   Follow Up Plan:  Pharmacist in person 01/26/25 PCP clinic visit needs to be scheduled   Lorain Baseman, PharmD [facility] [phone]  "

## 2024-12-29 NOTE — Patient Instructions (Signed)
 It was nice to see you today!  Your goal blood pressure goal is less than 130/80 mmHg. Please right down your values.   Medication Changes: STOP lisinopril   START amlodipine -valsartan  5-160 mg (1 tablet) once daily  START metoprolol  succinate 25 mg (1 tablet) once daily EVERY day for heart racing. Dr. Toby (Cardiology) said you could take 1 extra tablet if you are feeling your heart racing.  Continue furosemide  (Lasix ), your fluid pill, as needed if you gain 3 lbs in one day or 5 lbs in one week or notice swelling in your feet or ankles.   Continue all other medications the same.   Lorain Baseman, PharmD [facility] [phone]
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
## 2025-01-12 ENCOUNTER — Encounter: Payer: Self-pay | Admitting: Neurology

## 2025-01-12 ENCOUNTER — Ambulatory Visit (INDEPENDENT_AMBULATORY_CARE_PROVIDER_SITE_OTHER): Admitting: Neurology

## 2025-01-12 VITALS — BP 168/100 | HR 93 | Ht 66.0 in | Wt 253.0 lb

## 2025-01-12 DIAGNOSIS — R2689 Other abnormalities of gait and mobility: Secondary | ICD-10-CM

## 2025-01-12 DIAGNOSIS — M542 Cervicalgia: Secondary | ICD-10-CM

## 2025-01-12 DIAGNOSIS — R519 Headache, unspecified: Secondary | ICD-10-CM

## 2025-01-12 DIAGNOSIS — G8929 Other chronic pain: Secondary | ICD-10-CM | POA: Diagnosis not present

## 2025-01-12 NOTE — Patient Instructions (Signed)
 I will get a CT of your head and neck to make sure you did not hurt anything when you recently hit your head and caused more headaches and neck pain.  I am referring you to physical therapy to help with the neck pain.  If you take Topamax  50 mg at bedtime every day as I prescribed, this could help reduce the number and intensity of headaches.  Continue working on your blood pressure as this could help headaches as well.  The physicians and staff at [facility] are committed to providing excellent care. You may receive a survey requesting feedback about your experience at our office. We strive to receive very good responses to the survey questions. If you feel that your experience would prevent you from giving the office a very good  response, please contact our office to try to remedy the situation. We may be reached at [phone]. Thank you for taking the time out of your busy day to complete the survey.  Venetia Potters, MD [facility]

## 2025-01-17 ENCOUNTER — Other Ambulatory Visit: Payer: Self-pay

## 2025-01-17 ENCOUNTER — Telehealth: Payer: Self-pay

## 2025-01-17 ENCOUNTER — Telehealth: Payer: Self-pay | Admitting: Neurology

## 2025-01-17 DIAGNOSIS — G8929 Other chronic pain: Secondary | ICD-10-CM

## 2025-01-17 MED ORDER — TOPIRAMATE 50 MG PO TABS: ORAL_TABLET | ORAL | 1 refills | Status: AC

## 2025-01-17 NOTE — Telephone Encounter (Signed)
 Patient called stating she thought Dr.hill was supposed to send her a refill on her nortriptyline  25mg   [facility] on [address]

## 2025-01-17 NOTE — Progress Notes (Signed)
 Patient called my direct line to clarify date of her next appt. Confirmed that she will be able to make appt on 01/26/25 at 11:30AM. Reports she is recording blood pressures. Reviewed appropriate monitoring technique. Reports BP was 157/104 this AM, but this was after eating salt and vinegar chips. She is taking amlodipine -valsartan  5-160 mg daily as prescribed at previous visit. Patient confirmed she will bring BP log for review at upcoming appt. At that time we will monitor labs after switching from lisinopril  to valsartan  and plan to escalate dose of her BP medication.   Lorain Baseman, PharmD [facility] [phone]

## 2025-01-25 ENCOUNTER — Other Ambulatory Visit

## 2025-01-25 ENCOUNTER — Inpatient Hospital Stay: Admission: RE | Admit: 2025-01-25 | Source: Ambulatory Visit

## 2025-01-26 ENCOUNTER — Ambulatory Visit: Payer: Self-pay

## 2025-01-30 ENCOUNTER — Ambulatory Visit

## 2025-04-06 ENCOUNTER — Ambulatory Visit: Admitting: Neurology

## 2025-06-01 ENCOUNTER — Ambulatory Visit: Admitting: Dermatology
